# Patient Record
Sex: Male | Born: 1935
Health system: Southern US, Community
[De-identification: ages and names within clinical notes are randomized; demographics above are authoritative.]

## PROBLEM LIST (undated history)

## (undated) DIAGNOSIS — M5104 Intervertebral disc disorders with myelopathy, thoracic region: Secondary | ICD-10-CM

## (undated) DIAGNOSIS — M21372 Foot drop, left foot: Secondary | ICD-10-CM

## (undated) DIAGNOSIS — G7000 Myasthenia gravis without (acute) exacerbation: Secondary | ICD-10-CM

## (undated) DIAGNOSIS — I10 Essential (primary) hypertension: Secondary | ICD-10-CM

## (undated) DIAGNOSIS — R3915 Urgency of urination: Secondary | ICD-10-CM

## (undated) DIAGNOSIS — C801 Malignant (primary) neoplasm, unspecified: Secondary | ICD-10-CM

## (undated) DIAGNOSIS — M1990 Unspecified osteoarthritis, unspecified site: Secondary | ICD-10-CM

## (undated) DIAGNOSIS — J189 Pneumonia, unspecified organism: Secondary | ICD-10-CM

## (undated) DIAGNOSIS — R32 Unspecified urinary incontinence: Secondary | ICD-10-CM

## (undated) DIAGNOSIS — R1310 Dysphagia, unspecified: Secondary | ICD-10-CM

## (undated) DIAGNOSIS — E785 Hyperlipidemia, unspecified: Secondary | ICD-10-CM

## (undated) DIAGNOSIS — R490 Dysphonia: Secondary | ICD-10-CM

## (undated) HISTORY — PX: TONSILLECTOMY: SUR1361

## (undated) HISTORY — PX: SKIN CANCER EXCISION: SHX779

## (undated) HISTORY — PX: BACK SURGERY: SHX140

## (undated) HISTORY — PX: PROSTATE SURGERY: SHX751

## (undated) HISTORY — DX: Hyperlipidemia, unspecified: E78.5

## (undated) HISTORY — DX: Essential (primary) hypertension: I10

---

## 2003-06-04 ENCOUNTER — Ambulatory Visit (HOSPITAL_COMMUNITY): Admission: RE | Admit: 2003-06-04 | Discharge: 2003-06-04 | Payer: Self-pay | Admitting: Neurosurgery

## 2003-07-27 ENCOUNTER — Inpatient Hospital Stay (HOSPITAL_COMMUNITY): Admission: RE | Admit: 2003-07-27 | Discharge: 2003-07-28 | Payer: Self-pay | Admitting: Neurosurgery

## 2004-06-05 ENCOUNTER — Ambulatory Visit: Payer: Self-pay | Admitting: Family Medicine

## 2004-06-15 ENCOUNTER — Ambulatory Visit: Payer: Self-pay | Admitting: Family Medicine

## 2004-06-26 ENCOUNTER — Ambulatory Visit (HOSPITAL_COMMUNITY): Admission: RE | Admit: 2004-06-26 | Discharge: 2004-06-26 | Payer: Self-pay | Admitting: Neurosurgery

## 2004-11-05 ENCOUNTER — Ambulatory Visit: Payer: Self-pay | Admitting: Family Medicine

## 2004-12-12 ENCOUNTER — Ambulatory Visit: Payer: Self-pay | Admitting: Family Medicine

## 2005-05-06 ENCOUNTER — Ambulatory Visit: Payer: Self-pay | Admitting: Family Medicine

## 2005-05-08 ENCOUNTER — Ambulatory Visit: Payer: Self-pay | Admitting: Family Medicine

## 2005-09-25 ENCOUNTER — Ambulatory Visit: Payer: Self-pay | Admitting: Family Medicine

## 2005-10-03 ENCOUNTER — Ambulatory Visit: Payer: Self-pay | Admitting: Family Medicine

## 2006-01-09 ENCOUNTER — Ambulatory Visit: Payer: Self-pay | Admitting: Family Medicine

## 2006-03-07 ENCOUNTER — Ambulatory Visit: Payer: Self-pay | Admitting: Family Medicine

## 2006-03-20 ENCOUNTER — Ambulatory Visit: Payer: Self-pay | Admitting: Family Medicine

## 2007-03-02 ENCOUNTER — Encounter (INDEPENDENT_AMBULATORY_CARE_PROVIDER_SITE_OTHER): Payer: Self-pay | Admitting: Urology

## 2007-03-02 ENCOUNTER — Inpatient Hospital Stay (HOSPITAL_COMMUNITY): Admission: RE | Admit: 2007-03-02 | Discharge: 2007-03-03 | Payer: Self-pay | Admitting: Urology

## 2010-05-25 ENCOUNTER — Other Ambulatory Visit: Payer: Self-pay | Admitting: Neurosurgery

## 2010-05-25 DIAGNOSIS — M545 Low back pain, unspecified: Secondary | ICD-10-CM

## 2010-05-28 ENCOUNTER — Ambulatory Visit
Admission: RE | Admit: 2010-05-28 | Discharge: 2010-05-28 | Disposition: A | Discharge status: Home / Self Care | Payer: Medicare Other | Source: Ambulatory Visit | Attending: Neurosurgery | Admitting: Neurosurgery

## 2010-05-28 DIAGNOSIS — M545 Low back pain, unspecified: Secondary | ICD-10-CM

## 2010-05-30 ENCOUNTER — Inpatient Hospital Stay (HOSPITAL_COMMUNITY): Payer: Medicare Other

## 2010-05-30 ENCOUNTER — Inpatient Hospital Stay (HOSPITAL_COMMUNITY)
Admission: RE | Admit: 2010-05-30 | Discharge: 2010-05-31 | DRG: 491 | Disposition: A | Discharge status: Home / Self Care | Payer: Medicare Other | Source: Ambulatory Visit | Attending: Neurosurgery | Admitting: Neurosurgery

## 2010-05-30 DIAGNOSIS — M5137 Other intervertebral disc degeneration, lumbosacral region: Principal | ICD-10-CM | POA: Diagnosis present

## 2010-05-30 DIAGNOSIS — M51379 Other intervertebral disc degeneration, lumbosacral region without mention of lumbar back pain or lower extremity pain: Principal | ICD-10-CM | POA: Diagnosis present

## 2010-05-30 DIAGNOSIS — M216X9 Other acquired deformities of unspecified foot: Secondary | ICD-10-CM | POA: Diagnosis present

## 2010-05-30 LAB — URINALYSIS, ROUTINE W REFLEX MICROSCOPIC
Bilirubin Urine: NEGATIVE
Glucose, UA: NEGATIVE mg/dL
Hgb urine dipstick: NEGATIVE
Ketones, ur: NEGATIVE mg/dL
Protein, ur: NEGATIVE mg/dL
Urobilinogen, UA: 0.2 mg/dL (ref 0.0–1.0)

## 2010-05-30 LAB — CBC
MCH: 28.9 pg (ref 26.0–34.0)
MCHC: 33.4 g/dL (ref 30.0–36.0)
MCV: 86.6 fL (ref 78.0–100.0)
Platelets: 256 10*3/uL (ref 150–400)
RDW: 13.2 % (ref 11.5–15.5)
WBC: 9.2 10*3/uL (ref 4.0–10.5)

## 2010-05-30 LAB — BASIC METABOLIC PANEL
BUN: 14 mg/dL (ref 6–23)
CO2: 29 mEq/L (ref 19–32)
Chloride: 104 mEq/L (ref 96–112)
Creatinine, Ser: 0.76 mg/dL (ref 0.4–1.5)

## 2010-05-30 LAB — DIFFERENTIAL
Basophils Absolute: 0.1 10*3/uL (ref 0.0–0.1)
Eosinophils Relative: 2 % (ref 0–5)
Lymphocytes Relative: 29 % (ref 12–46)
Lymphs Abs: 2.6 10*3/uL (ref 0.7–4.0)
Monocytes Absolute: 0.7 10*3/uL (ref 0.1–1.0)
Neutro Abs: 5.6 10*3/uL (ref 1.7–7.7)

## 2010-05-30 LAB — APTT: aPTT: 30 seconds (ref 24–37)

## 2010-05-31 NOTE — Op Note (Signed)
Duane Burns, STRIKE               ACCOUNT NO.:  [id number]  MEDICAL RECORD NO.:  [id number]           PATIENT TYPE:  I  LOCATION:  3019                         FACILITY:  [facility]  PHYSICIAN:  Hilda Lias, M.D.   DATE OF BIRTH:  February 13, 1936  DATE OF PROCEDURE:  05/30/2010 DATE OF DISCHARGE:                              OPERATIVE REPORT   PREOPERATIVE DIAGNOSIS:  Left foot drop, chronic.  Degenerative disk disease, L3-L4, L4-L5, L5-S1.  POSTOPERATIVE DIAGNOSES:  Left foot drop, chronic.  Degenerative disk disease, L3-L4, L4-L5, L5-S1.  PROCEDURE:  Left L4-L5 hemilaminectomy, lysis of adhesions, decompression of the L3, L4, L5, and S1 nerve root, video microscope.  SURGEON:  Hilda Lias, MD.  ASSISTANT:  Stefani Dama, MD.  CLINICAL HISTORY:  [name] is a gentleman, who came to my office few days ago, complaining of back pain, worsened to the left leg. Clinically, he has a foot drop and this has been going on for more than a year.  In the past, he has a diskectomy at the level of L4-L5.  X-rays shows spondylosis at multiple level, worse at the L3-L4, L4-L5, borderline between L5-S1.  Surgery was advised and he and his wife knew the risks such as no improvement, need for surgery, CSF leak, infection, need for surgery.  DESCRIPTION OF PROCEDURE:  The patient was taken to the OR and he was positioned in a prone manner.  The back was cleaned with DuraPrep. Midline incision was made following the previous one and muscles were retracted laterally.  X-rays showed that we were at the level of L4-L5. Then, we started working our way.  The patient had quite a bit of adhesion to prevent any entry into the thecal sac.  We proceeded down below using hemilaminectomy of L5, working away from below up.  Above, we did a hemilaminectomy of L4 and worked our way to the midline.  At the level of L3-L4, he had quite a bit of stenosis, compromising the foramen and decompression of L3 was  done.  The same thing was done at the level of L4-L5.  We retracted the thecal sac and indeed he has had quite a bit of calcification with a calcified disk at the level of L4- L5.  We drilled that area.  Lysis was accomplished.  Then, we were unable to enter the disk space secondary to the innominate system.  The L5 nerve root was quite frail with mild fibrosis.  Decompression was done all the way laterally using the Kerrison punch.  Then, we worked our way at the level of L5-S1, and to my surprise, he has a herniated disk compromising the takeoff of S1.  Incision was made and diskectomy of L5-S1 was accomplished.  At the end, we had plenty of room for the L3, L4, L5, and S1 nerve root.  The area was irrigated. Valsalva maneuver was negative.  Tisseel was left in the pleural space, so was fentanyl and Depo-Medrol.  The wound was closed with Vicryl and staples.          ______________________________ Hilda Lias, M.D.     EB/MEDQ  D:  05/30/2010  T:  05/31/2010  Job:  [id number]  Electronically Signed by Hilda Lias M.D. on 05/31/2010 06:15:16 PM

## 2010-06-25 ENCOUNTER — Ambulatory Visit: Payer: Medicare Other | Attending: Neurosurgery | Admitting: Physical Therapy

## 2010-06-25 DIAGNOSIS — M6281 Muscle weakness (generalized): Secondary | ICD-10-CM | POA: Insufficient documentation

## 2010-06-25 DIAGNOSIS — M545 Low back pain, unspecified: Secondary | ICD-10-CM | POA: Insufficient documentation

## 2010-06-25 DIAGNOSIS — R5381 Other malaise: Secondary | ICD-10-CM | POA: Insufficient documentation

## 2010-06-25 DIAGNOSIS — IMO0001 Reserved for inherently not codable concepts without codable children: Secondary | ICD-10-CM | POA: Insufficient documentation

## 2010-06-28 ENCOUNTER — Ambulatory Visit: Payer: Medicare Other | Attending: Neurosurgery | Admitting: Physical Therapy

## 2010-06-28 DIAGNOSIS — R5381 Other malaise: Secondary | ICD-10-CM | POA: Insufficient documentation

## 2010-06-28 DIAGNOSIS — M6281 Muscle weakness (generalized): Secondary | ICD-10-CM | POA: Insufficient documentation

## 2010-06-28 DIAGNOSIS — M545 Low back pain, unspecified: Secondary | ICD-10-CM | POA: Insufficient documentation

## 2010-06-28 DIAGNOSIS — IMO0001 Reserved for inherently not codable concepts without codable children: Secondary | ICD-10-CM | POA: Insufficient documentation

## 2010-07-03 ENCOUNTER — Ambulatory Visit: Payer: Medicare Other | Admitting: Physical Therapy

## 2010-07-05 ENCOUNTER — Ambulatory Visit: Payer: Medicare Other | Admitting: Physical Therapy

## 2010-07-10 ENCOUNTER — Ambulatory Visit: Payer: Medicare Other | Admitting: *Deleted

## 2010-07-10 NOTE — Discharge Summary (Signed)
NAMEEBUBECHUKWU, JEDLICKA               ACCOUNT NO.:  [id number]   MEDICAL RECORD NO.:  [id number]          PATIENT TYPE:  INP   LOCATION:  1429                         FACILITY:  [facility]   PHYSICIAN:  Heloise Purpura, MD      DATE OF BIRTH:  01-May-1935   DATE OF ADMISSION:  03/02/2007  DATE OF DISCHARGE:  03/03/2007                               DISCHARGE SUMMARY   ADMISSION DIAGNOSIS:  Prostate cancer.   DISCHARGE DIAGNOSIS:  Prostate cancer.   HISTORY AND PHYSICAL:  For full details, please see admission history  and physical.  Briefly, Mr. Gottwald is a 75 year old gentleman with  clinical localized adenocarcinoma of the prostate.  After discussion  regarding management options for treatment, he elected to proceed with  surgical therapy and a robotic prostatectomy.   HOSPITAL COURSE:  On March 03, 2007, the patient was taken to the  operating room and underwent a robotic-assisted laparoscopic radical  prostatectomy.  He tolerated this procedure well without complications.  Postoperatively, he was able to be transferred to a regular hospital  room following recovery from anesthesia.  He was able to begin  ambulating the night of surgery, and was monitored and remained  hemodynamically stable that evening.  His hematocrit was checked on the  morning of postoperative day #1 and appeared to be stable at 39.1.  He  maintained excellent urine output with minimal output from his pelvic  drain.  His pelvic drain was therefore able to be removed.  He was begun  on a clear liquid diet, which he tolerated without difficulty.  He was  therefore transitioned to oral pain medication.  By the afternoon of  postoperative day #1, he had met all discharge criteria and was able to  be discharged home in excellent condition.   DISPOSITION:  Home.   DISCHARGE MEDICATIONS:  Mr. Tracz was instructed to resume his regular  home medications, excepting any aspirin, nonsteroidal anti-inflammatory  drugs, or  herbal supplements for 10 days.  He was given a prescription  to take Vicodin as needed for pain and Cipro to begin 1 day prior to his  return visit for Foley catheter removal.   DISCHARGE INSTRUCTIONS:  He is instructed to be ambulatory, but  specifically told to refrain from any heavy lifting, strenuous activity,  or driving.  He was instructed to gradually advance his diet once  passing flatus.  He was instructed on routine Foley catheter care.   FOLLOWUP:  Mr. Pumphrey will follow up in one week for removal of the  Foley catheter and to discuss the surgical pathology in detail.      Heloise Purpura, MD  Electronically Signed     LB/MEDQ  D:  03/03/2007  T:  03/03/2007  Job:  [id number]

## 2010-07-10 NOTE — Op Note (Signed)
Duane Burns, Duane Burns               ACCOUNT NO.:  [id number]   MEDICAL RECORD NO.:  [id number]          PATIENT TYPE:  INP   LOCATION:  0006                         FACILITY:  [facility]   PHYSICIAN:  Heloise Purpura, MD      DATE OF BIRTH:  1935-06-14   DATE OF PROCEDURE:  03/02/2007  DATE OF DISCHARGE:                               OPERATIVE REPORT   PREOPERATIVE DIAGNOSIS:  Clinically localized adenocarcinoma of  prostate.   POSTOPERATIVE DIAGNOSIS:  Clinically localized adenocarcinoma of  prostate.   PROCEDURE:  1. Robotic assisted laparoscopic radical prostatectomy (non nerve      sparing).  2. Bilateral laparoscopic pelvic lymphadenectomy.   SURGEON:  Dr. Heloise Purpura.   ASSISTANT:  Dr. [place] Service.   ANESTHESIA:  General.   COMPLICATIONS:  None.   ESTIMATED BLOOD LOSS:  100 mL.   SPECIMENS:  1. Prostate and seminal vesicles.  2. Right pelvic lymph nodes.  3. Left pelvic lymph nodes.   DISPOSITION OF SPECIMEN:  To pathology.   DRAINS:  1. 20-French straight catheter.  2. #19 Blake pelvic drain.   INDICATIONS:  Duane Burns is a 75 year old gentleman with clinically  localized adenocarcinoma of prostate.  After discussion regarding  management options for treatment, he elected to proceed with surgical  therapy and the above procedure.  Potential risks/complications and  alternative options were discussed with the patient in detail and  informed consent was obtained.   DESCRIPTION OF PROCEDURE:  The patient was taken to the operating room  and a general anesthetic was administered.  He was given preoperative  antibiotics, placed in the dorsal lithotomy position, prepped and draped  in the usual sterile fashion.  Next preoperative time-out was performed.  A Foley catheter was then inserted into the bladder.  A site was  selected just to the left of the umbilicus for placement of the camera  port.  This was placed using a standard open Hassan technique.  This  allowed entry into the peritoneal cavity under direct vision and without  difficulty.  A 12 mm port was then placed and a pneumoperitoneum was  established.  With the 0 degrees lens, the abdomen was inspected and  there was no evidence of intra-abdominal injury or other abnormalities.  Attention then turned to placement of the remaining ports.  Bilateral 8  mm robotic ports were placed 10 cm lateral to and just inferior to the  camera port.  An additional 8 mm port was placed in the far left lateral  abdominal wall.  A 5 mm port was placed between the camera port and the  right robotic port.  An additional 12 mm robotic port was placed in the  far right lateral abdominal wall for laparoscopic assistance.  All ports  were placed under direct vision without difficulty and under direct  vision.  The surgical cart was then docked.  With the aid of the cautery  scissors, the bladder was reflected posteriorly allowing entry into the  space of Retzius and identification of the endopelvic fascia and  prostate.  The endopelvic fascia was incised from the  apex back to the  base of the prostate bilaterally and the underlying levator muscle  fibers were swept laterally off the prostate.  The dorsal venous complex  was therefore isolated and was able to be stapled and divided with a 45  mm Flex ETS stapler.  The bladder neck was identified with the aid of  Foley catheter manipulation and was divided anteriorly thereby exposing  the catheter.  The catheter balloon was deflated and the catheter was  used to retract the prostate anteriorly.  This exposed the posterior  bladder neck which was then divided.  Dissection continued between the  prostate and bladder until the vasa deferentia and seminal vesicles were  identified.  The vasa deferentia were isolated, divided and lifted  anteriorly.  Seminal vesicles were then dissected down to their tips  with care to control seminal vesicle arterial blood supply.   The seminal  vesicles were also lifted anteriorly.  The seminal vesicles were noted  to be adherent during their dissection.  The space between the fascia  and the anterior rectum was then bluntly developed thereby isolating the  vascular pedicles of the prostate.  On the right side, a wide non nerve  sparing prostatectomy was performed with Hem-o-lok clips used for  hemostasis.  The prostate pedicles and periprosthetic fat were clipped  and divided along the right side of the prostate distally.  On the left  side, a wide non nerve sparing procedure was also performed with Hem-o-  lok clips again used.  There was noted to be some very thick tissue in  the periprostatic area on the left side.  A very wide non nerve sparing  procedure was performed and the urethra was then sharply divided  allowing the prostate specimen to be disarticulated.  The pelvis was  copiously irrigated and hemostasis ensured.  With irrigation in the  pelvis, air was injected into the rectal catheter.  There was no  evidence of a rectal injury.  Due to the fact that the patient did have  findings concerning for more advanced disease than initially suspected,  it was decided to perform a pelvic lymphadenectomy.  Attention turned  the right pelvic sidewall and the fibrofatty tissue between the external  iliac vein, confluence of the iliac vessels, hypogastric artery, and  Cooper's ligament was dissected free from the pelvic sidewall with care  to preserve the obturator nerve.  Hem-o-lok clips were used for  lymphostasis and hemostasis.  The specimen was then passed off for  permanent pathologic analysis and an identical procedure was performed  on the contralateral side.  Attention then turned to the urethral  anastomosis.  A 2-0 Vicryl slip-knot was used to reapproximate  Denonvilliers fascia, the posterior bladder and posterior urethra.  A  double-armed 3-0 Monocryl suture was then used to perform a 360 degrees   running tension-free anastomosis between the bladder neck and urethra.  A new #19 Blake drain was brought to the left robotic port and  appropriately positioned in the pelvis.  It was secured to skin with a  nylon suture.  The surgical cart was then undocked.  The right lateral  12 mm port site was closed with a 0 Vicryl suture placed in the aid of  suture passer device.  All remaining ports were removed under direct  vision.  The prostate was then removed intact within the Endopouch  retrieval bag via the periumbilical port site.  This port site was then  closed with a running  0 Vicryl suture.  All ports were injected with  quarter percent Marcaine and reapproximated at the skin level with  staples.  Sterile dressings were applied.  The patient appeared to  tolerate procedure well without complications.  He was able to be  extubated and transferred to the recovery unit in satisfactory  condition.      Heloise Purpura, MD  Electronically Signed     LB/MEDQ  D:  03/02/2007  T:  03/02/2007  Job:  [id number]

## 2010-07-10 NOTE — H&P (Signed)
NAMEEWALD, BEG               ACCOUNT NO.:  [id number]   MEDICAL RECORD NO.:  [id number]          PATIENT TYPE:  INP   LOCATION:  NA                           FACILITY:  [facility]   PHYSICIAN:  Heloise Purpura, MD      DATE OF BIRTH:  1935/06/13   DATE OF ADMISSION:  DATE OF DISCHARGE:                              HISTORY & PHYSICAL   CHIEF COMPLAINT:  Prostate cancer.   HISTORY:  Mr. Scholer is a 75 year old gentleman with clinical stage T2A  prostate cancer with a PSA of 6.4 and Gleason score of 3+3=6.  After  discussing management options for clinically localized prostate cancer,  the patient has elected to proceed with surgical therapy and a robotic  prostatectomy.   PAST MEDICAL HISTORY:  1. Dyslipidemia.  2. History of melanoma.   PAST SURGICAL HISTORY:  1. History of back surgery.  2. History of removal of melanoma from right arm.   MEDICATIONS:  1. Aspirin.  2. Fish oil.  3. Glucosamine.  4. Multivitamin.  5. Simvastatin.  6. Vitamin E.   ALLERGIES:  No known drug allergies.   FAMILY HISTORY:  He denies a history of prostate cancer.   SOCIAL HISTORY:  The patient is currently married and is retired.  He  denies alcohol and tobacco use.   REVIEW OF SYSTEMS:  Pertinent positives include chronic back and joint  pain.  All other systems are reviewed and are otherwise negative.   PHYSICAL EXAM:  CONSTITUTIONAL:  A well-nourished, well-developed, age-  appropriate male in no acute distress.  CARDIOVASCULAR:  Regular rate and rhythm without obvious murmurs.  PULMONARY:  Lungs are clear bilaterally.  ABDOMEN:  Soft and nontender.  GU:  The prostate is estimated to be 40 g with a palpable nodule toward  the left mid aspect of the prostate.  EXTREMITIES:  No edema.   IMPRESSION:  Clinically localized prostate cancer.   PLAN:  Mr. Zee will undergo a robotic-assisted laparoscopic radical  prostatectomy and then be admitted to the hospital for routine  postoperative care.      Heloise Purpura, MD  Electronically Signed     LB/MEDQ  D:  03/02/2007  T:  03/02/2007  Job:  [id number]

## 2010-07-12 ENCOUNTER — Ambulatory Visit: Payer: Medicare Other | Admitting: *Deleted

## 2010-07-13 NOTE — Op Note (Signed)
Duane Burns, Duane Burns               ACCOUNT NO.:  [id number]   MEDICAL RECORD NO.:  [id number]          PATIENT TYPE:  OIB   LOCATION:  2855                         FACILITY:  [facility]   PHYSICIAN:  Hilda Lias, M.D.   DATE OF BIRTH:  Jun 09, 1935   DATE OF PROCEDURE:  06/26/2004  DATE OF DISCHARGE:                                 OPERATIVE REPORT   PREOPERATIVE DIAGNOSIS:  Right carpal tunnel syndrome.   POSTOPERATIVE DIAGNOSIS:  Right carpal tunnel syndrome.   PROCEDURE:  Decompression of the right median nerve.   SURGEON:  Hilda Lias, M.D.   ANESTHESIA:  IV plus local.   CLINICAL HISTORY:  The patient in the past underwent diskectomy in the  lumbar area.  He came to my office complaining of numbness and atrophy of  the right hand.  Indeed, I found that he has atrophy of the right thenar  muscle with weakness.  The nerve conduction velocity test showed that there  was no conduction whatsoever in the right median nerve across the wrist.  Because we were concerned about __________ of the right median nerve of  unknown time, decompression was advised.  The risks were explained to the  patient including the possibility of no improvement whatsoever.   PROCEDURE:  The patient was taken to the OR and the right hand and arm was  prepped with Betadine.  After that, the area was draped.  Infiltration with  Xylocaine was given along the base of the thumb.  Then with the knife we did  an incision along the base of the thumb through the skin, volar ligament and  through a thick carpal ligament which was calcified.  Decompression  posterior, distal and proximal was made along the ulnar nerve.  The nerve  was found to be flat and yellowish.  Having good decompression, the area was  irrigated, hemostasis was done with bipolar.  The wound was closed with  nylon.  The patient was stable, moving all fingers including the thumb at  the end of the procedure.  He is going to go to the recovery  room and  discharge when he is stable.  He will be seen by me in 48 hours or before.      EB/MEDQ  D:  06/26/2004  T:  06/26/2004  Job:  [id number]

## 2010-07-13 NOTE — H&P (Signed)
NAMEKENDAL, RAFFO                         ACCOUNT NO.:  [id number]   MEDICAL RECORD NO.:  [id number]                   PATIENT TYPE:  INP   LOCATION:  2899                                 FACILITY:  [facility]   PHYSICIAN:  Hilda Lias, M.D.                DATE OF BIRTH:  1936-01-24   DATE OF ADMISSION:  07/27/2003  DATE OF DISCHARGE:                                HISTORY & PHYSICAL   Mr. Koeppen is a gentleman who almost 30 years ago underwent left L4-5  discectomy.  Patient did really well.  He has not had any problem  whatsoever, but lately about 2-3 months ago he started to complain of back  pain, radiation down to the left leg associated with weakness.  I saw him in  my office in the middle of April, 2005; indeed he had quite a bit of  weakness of the left foot.  He declined surgery, and we went ahead with  conservative treatment.  Now he is not any better.  Despite medications, he  is getting worse.  Because of that, he wants to proceed with surgery.   PAST MEDICAL HISTORY:  L4-5 discectomy in 1992.   SOCIAL HISTORY:  Negative.   FAMILY HISTORY:  Unremarkable.   REVIEW OF SYSTEMS:  Positive for back and left leg pain.   PHYSICAL EXAMINATION:  GENERAL:  Patient came to my office limping on the  left foot.  HEENT:  Head, eyes, nose and throat are normal.  NECK:  Normal.  LUNGS:  Clear.  CARDIOVASCULAR:  Heart sounds normal.  There were no murmurs, normal pulses.  MENTAL STATUS:  Normal.  NEUROLOGIC:  Strength 2/5 weakness dorsiflexion of the left foot.  Sensory:  He complained of numbness which involved mostly the L5 nerve root.  Straight-  leg-raising:  Left side positive at 45 degrees.   The x-ray as well as the MRI shows degenerative changes with stenosis at  multiple levels, but at the level for 4-5 he has a herniated disk symptoms  to the left side.   CLINICAL IMPRESSION:  1. Degenerative disk disease with lumbar stenosis.  2. Left L4-5 herniated disk.   RECOMMENDATION:  Although the patient has multiple levels of compromise in  the lumbar spine, his main problem is the __________  requiring __________ .  We are going to take him to surgery to do L4-5 discectomy.  The surgery was  fully explained to him, including the possibility of recurrent need for  surgery, which might include fusion, CSF leak, infection, damage to the  vessels, the abdomen and __________.                                               Hilda Lias, M.D.   EB/MEDQ  D:  07/27/2003  T:  07/27/2003  Job:  [id number]

## 2010-07-13 NOTE — Op Note (Signed)
[name], Duane Burns                         ACCOUNT NO.:  [id number]   MEDICAL RECORD NO.:  [id number]                   PATIENT TYPE:  INP   LOCATION:  2899                                 FACILITY:  [facility]   PHYSICIAN:  Hilda Lias, M.D.                DATE OF BIRTH:  02/12/1936   DATE OF PROCEDURE:  07/27/2003  DATE OF DISCHARGE:                                 OPERATIVE REPORT   PREOPERATIVE DIAGNOSES:  1. Left L4-L5 herniated disk.  2. Foraminal stenosis.  3. Status post L4-5 diskectomy 1982.  4. Degenerative disk disease multiple levels.  5. Foot drop.   POSTOPERATIVE DIAGNOSES:  1. Left L4-L5 herniated disk.  2. Foraminal stenosis.  3. Status post L4-5 diskectomy 1982.  4. Degenerative disk disease multiple levels.  5. Foot drop.   OPERATION PERFORMED:  1. Left L4-5 laminotomy.  2. Lysis of adhesions.  3. Removal of calcified herniated disk compromising the L5 nerve root.  4. Foraminotomy for L4 and L5, microscope.   SURGEON:  Hilda Lias, M.D.   ASSISTANT:  Stefani Dama, M.D.   ANESTHESIA:  General.   INDICATIONS FOR PROCEDURE:  Duane Burns is a gentleman who back in 1992  underwent left 4-5 diskectomy.  Lately he had been complaining of more pain  down to the left leg associated with a 1/5 weakness of dorsiflexion of the  left foot.  MRI showed multiple levels of disease but at the level 4-5 he  had what looked like foraminal stenosis associated with herniated disk  disease.  Surgery was advised and the risks were explained in the history  and physical.   DESCRIPTION OF PROCEDURE:  The patient was taken to the operating room and  after intubation, he was positioned in a prone manner.  His back was prepped  with Betadine.  Midline incision followed the previous one but only using  half was made.  Muscles were retracted laterally.  Indeed, we found quite a  bit of calcification of the ligament.  With the microscope, we were able to  visualize the  lower part of L4 and the upper of L5.  Because of all the scar  tissue we decided to go below L5.  We removed half of the lamina of L5 until  we found the dura mater.  Then we worked our way up to lysis of adhesions.  The scar tissue was removed.  It was difficult to retract the L5 nerve root.  We drilled laterally until we were able to work our way underneath the  thecal sac.  Lysis was accomplished.  Indeed, there was a large calcified  disk from L4-5 down into the foramina of L5.  Removal of the disk using the  drill as well as the microcuret was accomplished.  We entered in the disk  space and it was absolutely empty.  Having good decompression of the L5 we  took  a look at the L4.  Foraminotomy was done to give more space to the L4  nerve root.  Valsalva maneuver was negative.  Then fentanyl and Depo-Medrol  were left in the epidural space and the wound was closed with Vicryl and  Steri-Strips.                                               Hilda Lias, M.D.    EB/MEDQ  D:  07/27/2003  T:  07/27/2003  Job:  [id number]

## 2010-07-17 ENCOUNTER — Ambulatory Visit: Payer: Medicare Other | Admitting: *Deleted

## 2010-07-19 ENCOUNTER — Ambulatory Visit: Payer: Medicare Other | Admitting: *Deleted

## 2010-07-24 ENCOUNTER — Ambulatory Visit: Payer: Medicare Other | Admitting: Physical Therapy

## 2010-07-26 ENCOUNTER — Ambulatory Visit: Payer: Medicare Other | Admitting: *Deleted

## 2010-07-31 ENCOUNTER — Ambulatory Visit: Payer: Medicare Other | Attending: Neurosurgery | Admitting: Physical Therapy

## 2010-07-31 DIAGNOSIS — IMO0001 Reserved for inherently not codable concepts without codable children: Secondary | ICD-10-CM | POA: Insufficient documentation

## 2010-07-31 DIAGNOSIS — R5381 Other malaise: Secondary | ICD-10-CM | POA: Insufficient documentation

## 2010-07-31 DIAGNOSIS — M6281 Muscle weakness (generalized): Secondary | ICD-10-CM | POA: Insufficient documentation

## 2010-07-31 DIAGNOSIS — M545 Low back pain, unspecified: Secondary | ICD-10-CM | POA: Insufficient documentation

## 2010-08-02 ENCOUNTER — Ambulatory Visit: Payer: Medicare Other | Admitting: Physical Therapy

## 2010-08-07 ENCOUNTER — Ambulatory Visit: Payer: Medicare Other | Admitting: Physical Therapy

## 2010-08-09 ENCOUNTER — Encounter: Payer: Medicare Other | Admitting: Physical Therapy

## 2010-08-16 ENCOUNTER — Ambulatory Visit: Payer: Medicare Other | Admitting: Physical Therapy

## 2010-08-21 ENCOUNTER — Ambulatory Visit: Payer: Medicare Other | Admitting: Physical Therapy

## 2010-08-23 ENCOUNTER — Ambulatory Visit: Payer: Medicare Other | Admitting: Physical Therapy

## 2010-11-15 LAB — CBC
Hemoglobin: 13.6
Platelets: 309
RDW: 13.3

## 2010-11-15 LAB — HEMOGLOBIN AND HEMATOCRIT, BLOOD: Hemoglobin: 12.6 — ABNORMAL LOW

## 2010-11-30 LAB — CBC
HCT: 40.9
MCV: 84.3
RBC: 4.85
WBC: 10.1

## 2010-11-30 LAB — BASIC METABOLIC PANEL
BUN: 15
Calcium: 9.3
Chloride: 107
Creatinine, Ser: 0.98
GFR calc Af Amer: >60
GFR calc non Af Amer: >60

## 2010-11-30 LAB — TYPE AND SCREEN
ABO/RH(D): B POS
Antibody Screen: NEGATIVE

## 2013-10-05 DIAGNOSIS — I10 Essential (primary) hypertension: Secondary | ICD-10-CM | POA: Diagnosis present

## 2013-10-05 DIAGNOSIS — E785 Hyperlipidemia, unspecified: Secondary | ICD-10-CM | POA: Insufficient documentation

## 2013-12-27 ENCOUNTER — Emergency Department (HOSPITAL_COMMUNITY)
Admission: EM | Admit: 2013-12-27 | Discharge: 2013-12-27 | Disposition: A | Discharge status: Home / Self Care | Payer: Medicare HMO | Attending: Emergency Medicine | Admitting: Emergency Medicine

## 2013-12-27 ENCOUNTER — Emergency Department (HOSPITAL_COMMUNITY): Payer: Medicare HMO

## 2013-12-27 ENCOUNTER — Encounter (HOSPITAL_COMMUNITY): Payer: Self-pay

## 2013-12-27 DIAGNOSIS — Z79899 Other long term (current) drug therapy: Secondary | ICD-10-CM | POA: Diagnosis not present

## 2013-12-27 DIAGNOSIS — Z8546 Personal history of malignant neoplasm of prostate: Secondary | ICD-10-CM | POA: Insufficient documentation

## 2013-12-27 DIAGNOSIS — M549 Dorsalgia, unspecified: Secondary | ICD-10-CM | POA: Diagnosis not present

## 2013-12-27 DIAGNOSIS — Z7982 Long term (current) use of aspirin: Secondary | ICD-10-CM | POA: Diagnosis not present

## 2013-12-27 DIAGNOSIS — M25551 Pain in right hip: Secondary | ICD-10-CM | POA: Diagnosis not present

## 2013-12-27 DIAGNOSIS — M25569 Pain in unspecified knee: Secondary | ICD-10-CM

## 2013-12-27 DIAGNOSIS — M25561 Pain in right knee: Secondary | ICD-10-CM

## 2013-12-27 DIAGNOSIS — M79609 Pain in unspecified limb: Secondary | ICD-10-CM

## 2013-12-27 HISTORY — DX: Malignant (primary) neoplasm, unspecified: C80.1

## 2013-12-27 MED ORDER — HYDROCODONE-ACETAMINOPHEN 5-325 MG PO TABS
1.0000 | ORAL_TABLET | Freq: Four times a day (QID) | ORAL | Status: DC | PRN
Start: 2013-12-27 — End: 2014-05-11

## 2013-12-27 MED ORDER — HYDROCODONE-ACETAMINOPHEN 5-325 MG PO TABS
1.0000 | ORAL_TABLET | Freq: Once | ORAL | Status: AC
  Administered 2013-12-27: 1 via ORAL
  Filled 2013-12-27: qty 1

## 2013-12-27 NOTE — ED Notes (Signed)
EDMD at bedside

## 2013-12-27 NOTE — ED Notes (Signed)
Pt is c/o right leg pain.  Pt reports that he has had pain to his right leg for aprox. 4 weeks and have an orthopedic appointment at [facility] for the same.  Pt reports this morning the veins in his right leg "got really big and you could just see them bounding."

## 2013-12-27 NOTE — Discharge Instructions (Signed)
Hip Pain Your hip is the joint between your upper legs and your lower pelvis. The bones, cartilage, tendons, and muscles of your hip joint perform a lot of work each day supporting your body weight and allowing you to move around. Hip pain can range from a minor ache to severe pain in one or both of your hips. Pain may be felt on the inside of the hip joint near the groin, or the outside near the buttocks and upper thigh. You may have swelling or stiffness as well.  HOME CARE INSTRUCTIONS   Take medicines only as directed by your health care provider.  Apply ice to the injured area:  Put ice in a plastic bag.  Place a towel between your skin and the bag.  Leave the ice on for 15-20 minutes at a time, 3-4 times a day.  Keep your leg raised (elevated) when possible to lessen swelling.  Avoid activities that cause pain.  Follow specific exercises as directed by your health care provider.  Sleep with a pillow between your legs on your most comfortable side.  Record how often you have hip pain, the location of the pain, and what it feels like. SEEK MEDICAL CARE IF:   You are unable to put weight on your leg.  Your hip is red or swollen or very tender to touch.  Your pain or swelling continues or worsens after 1 week.  You have increasing difficulty walking.  You have a fever. SEEK IMMEDIATE MEDICAL CARE IF:   You have fallen.  You have a sudden increase in pain and swelling in your hip. MAKE SURE YOU:   Understand these instructions.  Will watch your condition.  Will get help right away if you are not doing well or get worse. Document Released: 08/01/2009 Document Revised: 06/28/2013 Document Reviewed: 10/08/2012 [facility] Patient Information 2015 [place], [place]. This information is not intended to replace advice given to you by your health care provider. Make sure you discuss any questions you have with your health care provider.  Knee Pain The knee is the complex joint  between your thigh and your lower leg. It is made up of bones, tendons, ligaments, and cartilage. The bones that make up the knee are:  The femur in the thigh.  The tibia and fibula in the lower leg.  The patella or kneecap riding in the groove on the lower femur. CAUSES  Knee pain is a common complaint with many causes. A few of these causes are:  Injury, such as:  A ruptured ligament or tendon injury.  Torn cartilage.  Medical conditions, such as:  Gout  Arthritis  Infections  Overuse, over training, or overdoing a physical activity. Knee pain can be minor or severe. Knee pain can accompany debilitating injury. Minor knee problems often respond well to self-care measures or get well on their own. More serious injuries may need medical intervention or even surgery. SYMPTOMS The knee is complex. Symptoms of knee problems can vary widely. Some of the problems are:  Pain with movement and weight bearing.  Swelling and tenderness.  Buckling of the knee.  Inability to straighten or extend your knee.  Your knee locks and you cannot straighten it.  Warmth and redness with pain and fever.  Deformity or dislocation of the kneecap. DIAGNOSIS  Determining what is wrong may be very straight forward such as when there is an injury. It can also be challenging because of the complexity of the knee. Tests to make a  diagnosis may include:  Your caregiver taking a history and doing a physical exam.  Routine X-rays can be used to rule out other problems. X-rays will not reveal a cartilage tear. Some injuries of the knee can be diagnosed by:  Arthroscopy a surgical technique by which a small video camera is inserted through tiny incisions on the sides of the knee. This procedure is used to examine and repair internal knee joint problems. Tiny instruments can be used during arthroscopy to repair the torn knee cartilage (meniscus).  Arthrography is a radiology technique. A contrast  liquid is directly injected into the knee joint. Internal structures of the knee joint then become visible on X-ray film.  An MRI scan is a non X-ray radiology procedure in which magnetic fields and a computer produce two- or three-dimensional images of the inside of the knee. Cartilage tears are often visible using an MRI scanner. MRI scans have largely replaced arthrography in diagnosing cartilage tears of the knee.  Blood work.  Examination of the fluid that helps to lubricate the knee joint (synovial fluid). This is done by taking a sample out using a needle and a syringe. TREATMENT The treatment of knee problems depends on the cause. Some of these treatments are:  Depending on the injury, proper casting, splinting, surgery, or physical therapy care will be needed.  Give yourself adequate recovery time. Do not overuse your joints. If you begin to get sore during workout routines, back off. Slow down or do fewer repetitions.  For repetitive activities such as cycling or running, maintain your strength and nutrition.  Alternate muscle groups. For example, if you are a weight lifter, work the upper body on one day and the lower body the next.  Either tight or weak muscles do not give the proper support for your knee. Tight or weak muscles do not absorb the stress placed on the knee joint. Keep the muscles surrounding the knee strong.  Take care of mechanical problems.  If you have flat feet, orthotics or special shoes may help. See your caregiver if you need help.  Arch supports, sometimes with wedges on the inner or outer aspect of the heel, can help. These can shift pressure away from the side of the knee most bothered by osteoarthritis.  A brace called an "unloader" brace also may be used to help ease the pressure on the most arthritic side of the knee.  If your caregiver has prescribed crutches, braces, wraps or ice, use as directed. The acronym for this is PRICE. This means  protection, rest, ice, compression, and elevation.  Nonsteroidal anti-inflammatory drugs (NSAIDs), can help relieve pain. But if taken immediately after an injury, they may actually increase swelling. Take NSAIDs with food in your stomach. Stop them if you develop stomach problems. Do not take these if you have a history of ulcers, stomach pain, or bleeding from the bowel. Do not take without your caregiver's approval if you have problems with fluid retention, heart failure, or kidney problems.  For ongoing knee problems, physical therapy may be helpful.  Glucosamine and chondroitin are over-the-counter dietary supplements. Both may help relieve the pain of osteoarthritis in the knee. These medicines are different from the usual anti-inflammatory drugs. Glucosamine may decrease the rate of cartilage destruction.  Injections of a corticosteroid drug into your knee joint may help reduce the symptoms of an arthritis flare-up. They may provide pain relief that lasts a few months. You may have to wait a few months between injections.  The injections do have a small increased risk of infection, water retention, and elevated blood sugar levels.  Hyaluronic acid injected into damaged joints may ease pain and provide lubrication. These injections may work by reducing inflammation. A series of shots may give relief for as long as 6 months.  Topical painkillers. Applying certain ointments to your skin may help relieve the pain and stiffness of osteoarthritis. Ask your pharmacist for suggestions. Many over the-counter products are approved for temporary relief of arthritis pain.  In some countries, doctors often prescribe topical NSAIDs for relief of chronic conditions such as arthritis and tendinitis. A review of treatment with NSAID creams found that they worked as well as oral medications but without the serious side effects. PREVENTION  Maintain a healthy weight. Extra pounds put more strain on your  joints.  Get strong, stay limber. Weak muscles are a common cause of knee injuries. Stretching is important. Include flexibility exercises in your workouts.  Be smart about exercise. If you have osteoarthritis, chronic knee pain or recurring injuries, you may need to change the way you exercise. This does not mean you have to stop being active. If your knees ache after jogging or playing basketball, consider switching to swimming, water aerobics, or other low-impact activities, at least for a few days a week. Sometimes limiting high-impact activities will provide relief.  Make sure your shoes fit well. Choose footwear that is right for your sport.  Protect your knees. Use the proper gear for knee-sensitive activities. Use kneepads when playing volleyball or laying carpet. Buckle your seat belt every time you drive. Most shattered kneecaps occur in car accidents.  Rest when you are tired. SEEK MEDICAL CARE IF:  You have knee pain that is continual and does not seem to be getting better.  SEEK IMMEDIATE MEDICAL CARE IF:  Your knee joint feels hot to the touch and you have a high fever. MAKE SURE YOU:   Understand these instructions.  Will watch your condition.  Will get help right away if you are not doing well or get worse. Document Released: 12/09/2006 Document Revised: 05/06/2011 Document Reviewed: 12/09/2006 [facility] Patient Information 2015 [place], [place]. This information is not intended to replace advice given to you by your health care provider. Make sure you discuss any questions you have with your health care provider.

## 2013-12-27 NOTE — Progress Notes (Signed)
VASCULAR LAB PRELIMINARY  PRELIMINARY  PRELIMINARY  PRELIMINARY  Right lower extremity venous Doppler completed.    Preliminary report:  There is no DVT or SVT noted in the right lower extremity.   Duane Burns, RVT 12/27/2013, 1:17 PM

## 2013-12-27 NOTE — ED Provider Notes (Signed)
CSN: [id number]     Arrival date & time 12/27/13  1030 History   First MD Initiated Contact with Patient 12/27/13 1106     Chief Complaint  Patient presents with  . Leg Pain     (Consider location/radiation/quality/duration/timing/severity/associated sxs/prior Treatment) Patient is a 78 y.o. male presenting with leg pain. The history is provided by the patient.  Leg Pain Associated symptoms: back pain   patient has pain in his right groin and right knee. Has been getting worse over the last month. Reportedly seen at [facility]. Reportedly negative x-rays. Had increasing swelling in his veins on that side. No chest pain, no trouble breathing. No fevers. No trauma. Previous history of prostate cancer.family history of blood clots, although the patient has never had one.  Past Medical History  Diagnosis Date  . Cancer    Past Surgical History  Procedure Laterality Date  . Back surgery     History reviewed. No pertinent family history. History  Substance Use Topics  . Smoking status: Never Smoker   . Smokeless tobacco: Not on file  . Alcohol Use: No    Review of Systems  Constitutional: Negative for activity change and appetite change.  Eyes: Negative for pain.  Respiratory: Negative for chest tightness and shortness of breath.   Cardiovascular: Negative for chest pain and leg swelling.  Gastrointestinal: Negative for nausea, vomiting, abdominal pain and diarrhea.  Genitourinary: Negative for flank pain.  Musculoskeletal: Positive for back pain. Negative for neck stiffness.       Right hip right knee pain  Skin: Negative for color change, rash and wound.  Neurological: Negative for weakness, numbness and headaches.  Psychiatric/Behavioral: Negative for behavioral problems.      Allergies  Review of patient's allergies indicates no known allergies.  Home Medications   Prior to Admission medications   Medication Sig Start Date End Date Taking? Authorizing  Provider  aspirin EC 81 MG tablet Take 81 mg by mouth daily.   Yes Historical Provider, MD  calcium elemental as carbonate (TUMS ULTRA 1000) 400 MG tablet Chew 1,000 mg by mouth daily.   Yes Historical Provider, MD  cetirizine (ZYRTEC) 10 MG tablet Take 10 mg by mouth at bedtime.   Yes Historical Provider, MD  cholecalciferol (VITAMIN D) 1000 UNITS tablet Take 1,000 Units by mouth daily.   Yes Historical Provider, MD  Cinnamon 500 MG TABS Take 500 mg by mouth daily.   Yes Historical Provider, MD  lisinopril (PRINIVIL,ZESTRIL) 10 MG tablet Take 10 mg by mouth daily.   Yes Historical Provider, MD  Multiple Vitamin (MULTIVITAMIN WITH MINERALS) TABS tablet Take 1 tablet by mouth daily.   Yes Historical Provider, MD  Omega-3 Fatty Acids (FISH OIL) 1200 MG CAPS Take 1,200-2,400 mg by mouth 2 (two) times daily. Take 2 capsules in the morning and 1 capsule in the evening   Yes Historical Provider, MD  pravastatin (PRAVACHOL) 80 MG tablet Take 80 mg by mouth daily.   Yes Historical Provider, MD  HYDROcodone-acetaminophen (NORCO/VICODIN) 5-325 MG per tablet Take 1-2 tablets by mouth every 6 (six) hours as needed for moderate pain. 12/27/13   Jasper Riling. Graylin Sperling, MD   BP 146/86 mmHg  Pulse 79  Temp(Src) 99 F (37.2 C) (Oral)  Ht 5\' 10"  (4.580 m)  Wt 200 lb (90.719 kg)  BMI 28.70 kg/m2  SpO2 96% Physical Exam  Constitutional: He appears well-developed and well-nourished.  HENT:  Head: Normocephalic.  Cardiovascular: Normal rate and regular rhythm.  Pulmonary/Chest: Effort normal. He has no rales.  Abdominal: Soft. There is no tenderness.  Musculoskeletal: He exhibits tenderness.  Decreased range of motion right knee due to pain.no erythema. Neurovascularly intact over right foot. Some decreased range of motion right hip. Some prominent veins on the right lower leg. No erythema  Skin: Skin is warm.    ED Course  Procedures (including critical care time) Labs Review Labs Reviewed - No data to  display  Imaging Review Dg Hip Complete Right  12/27/2013   CLINICAL DATA:  Right hip pain.  No known injury.  EXAM: RIGHT HIP - COMPLETE 2+ VIEW  COMPARISON:  None.  FINDINGS: Moderate bilateral hip osteoarthritis with joint space narrowing, subchondral sclerosis, and subchondral cyst change noted. There is marginal spur formation identified. No fracture or subluxation noted.  IMPRESSION: 1. Moderate bilateral hip osteoarthritis. 2. No acute findings identified.   Electronically Signed   By: Kerby Moors M.D.   On: 12/27/2013 13:36   Dg Knee Complete 4 Views Right  12/27/2013   CLINICAL DATA:  Knee pain; no mention of trauma.  EXAM: RIGHT KNEE - COMPLETE 4+ VIEW  COMPARISON:  None  FINDINGS: The bones are adequately mineralized. There is beaking of the tibial spines. The joint spaces are reasonably well maintained. There is no joint effusion. There is chondrocalcinosis of the menisci. There are soft tissue calcifications in the popliteal fossa region.  IMPRESSION: There are mild degenerative changes of all 3 joint compartments. There is chondrocalcinosis. There is no acute bony abnormality.   Electronically Signed   By: David  Martinique   On: 12/27/2013 13:35     EKG Interpretation None      MDM   Final diagnoses:  Knee pain  Right knee pain  Hip pain, right    Patient with knee pain and hip pain. Some swelling xray and doppler reassuring. Will d/c with ortho follow up    Jasper Riling. Alvino Chapel, MD 12/28/13 [phone]

## 2014-01-25 ENCOUNTER — Other Ambulatory Visit: Payer: Self-pay

## 2014-01-25 ENCOUNTER — Encounter: Payer: Self-pay | Admitting: Vascular Surgery

## 2014-01-25 DIAGNOSIS — I83811 Varicose veins of right lower extremities with pain: Secondary | ICD-10-CM

## 2014-01-25 DIAGNOSIS — M79604 Pain in right leg: Secondary | ICD-10-CM

## 2014-02-14 ENCOUNTER — Encounter: Payer: Self-pay | Admitting: Vascular Surgery

## 2014-02-15 ENCOUNTER — Ambulatory Visit (INDEPENDENT_AMBULATORY_CARE_PROVIDER_SITE_OTHER): Payer: Medicare HMO | Admitting: Vascular Surgery

## 2014-02-15 ENCOUNTER — Encounter: Payer: Self-pay | Admitting: Vascular Surgery

## 2014-02-15 ENCOUNTER — Ambulatory Visit (HOSPITAL_COMMUNITY)
Admission: RE | Admit: 2014-02-15 | Discharge: 2014-02-15 | Disposition: A | Discharge status: Home / Self Care | Payer: Medicare HMO | Source: Ambulatory Visit | Attending: Vascular Surgery | Admitting: Vascular Surgery

## 2014-02-15 VITALS — BP 142/72 | HR 73 | Resp 16 | Ht 69.0 in | Wt 196.0 lb

## 2014-02-15 DIAGNOSIS — I8391 Asymptomatic varicose veins of right lower extremity: Secondary | ICD-10-CM

## 2014-02-15 DIAGNOSIS — I83811 Varicose veins of right lower extremities with pain: Secondary | ICD-10-CM | POA: Insufficient documentation

## 2014-02-15 DIAGNOSIS — I8390 Asymptomatic varicose veins of unspecified lower extremity: Secondary | ICD-10-CM | POA: Insufficient documentation

## 2014-02-15 DIAGNOSIS — M79604 Pain in right leg: Secondary | ICD-10-CM | POA: Insufficient documentation

## 2014-02-15 NOTE — Progress Notes (Signed)
Subjective:     Patient ID: MALE Duane Burns, male   DOB: 1935/04/04, 78 y.o.   MRN: [id number]  HPI this 78 year old male was referred for right leg pain and varicose veins. He has been having some severe right hip discomfort recently had times causing him difficulty getting out of bed. He has had 3 previous lumbar spine procedures by Dr. Sherwood Gambler. He denies any history of DVT, thrombophlebitis, stasis ulcers, or bleeding. He has no history of significant swelling. He has noted some bulging veins between the knee and the ankle on occasion. Past Medical History  Diagnosis Date  . Cancer   . Hyperlipidemia   . Hypertension     History  Substance Use Topics  . Smoking status: Current Every Day Smoker  . Smokeless tobacco: Never Used  . Alcohol Use: No    Family History  Problem Relation Age of Onset  . Diabetes Daughter   . Hypertension Daughter     No Known Allergies  Current outpatient prescriptions: aspirin EC 81 MG tablet, Take 81 mg by mouth daily., Disp: , Rfl: ;  calcium elemental as carbonate (TUMS ULTRA 1000) 400 MG tablet, Chew 1,000 mg by mouth daily., Disp: , Rfl: ;  cholecalciferol (VITAMIN D) 1000 UNITS tablet, Take 1,000 Units by mouth daily., Disp: , Rfl: ;  Cinnamon 500 MG TABS, Take 500 mg by mouth daily., Disp: , Rfl:  lisinopril (PRINIVIL,ZESTRIL) 10 MG tablet, Take 10 mg by mouth daily., Disp: , Rfl: ;  Multiple Vitamin (MULTIVITAMIN WITH MINERALS) TABS tablet, Take 1 tablet by mouth daily., Disp: , Rfl: ;  Omega-3 Fatty Acids (FISH OIL) 1200 MG CAPS, Take 1,200-2,400 mg by mouth 2 (two) times daily. Take 2 capsules in the morning and 1 capsule in the evening, Disp: , Rfl: ;  pravastatin (PRAVACHOL) 80 MG tablet, Take 80 mg by mouth daily., Disp: , Rfl:  cetirizine (ZYRTEC) 10 MG tablet, Take 10 mg by mouth at bedtime., Disp: , Rfl: ;  HYDROcodone-acetaminophen (NORCO/VICODIN) 5-325 MG per tablet, Take 1-2 tablets by mouth every 6 (six) hours as needed for moderate pain.  (Patient not taking: Reported on 02/15/2014), Disp: 15 tablet, Rfl: 0;  predniSONE (DELTASONE) 20 MG tablet, Take 2 tabs daily x 7 days, Disp: , Rfl:   BP 142/72 mmHg  Pulse 73  Resp 16  Ht 5\' 9"  (1.753 m)  Wt 196 lb (88.905 kg)  BMI 28.93 kg/m2  Body mass index is 28.93 kg/(m^2).           Review of Systems had history of lumbar spine disease with nerve compression left leg which has now resolved following surgery. No history coronary artery disease, stroke, chest pain, dyspnea on exertion, PND, orthopnea. Has generalized weakness of his right leg. Other systems negative and complete review of systems other than history of present illness     Objective:   Physical Exam BP 142/72 mmHg  Pulse 73  Resp 16  Ht 5\' 9"  (1.753 m)  Wt 196 lb (88.905 kg)  BMI 28.93 kg/m2  Gen.-alert and oriented x3 in no apparent distress HEENT normal for age Lungs no rhonchi or wheezing Cardiovascular regular rhythm no murmurs carotid pulses 3+ palpable no bruits audible Abdomen soft nontender no palpable masses Musculoskeletal free of  major deformities-pain in right hip joint with active or passive flexion Skin clear -no rashes Neurologic normal Lower extremities 3+ femoral and dorsalis pedis pulses palpable bilaterally with no edema  Today I ordered a venous duplex exam of  the right leg which I reviewed and interpreted and an arterial study. Patient has normal arterial flow with triphasic waveforms. Patient does have some reflux in the right great saphenous vein with borderline size supplying some small varicosities but no DVT.       Assessment:     Varicose veins right leg-asymptomatic Severe right hip discomfort-not caused by varicose veins or arterial insufficiency    Plan:     Would recommend return visit to orthopedic surgeon for further evaluation of right hip discomfort which seems mechanical in nature and is not related to the venous insufficiency

## 2014-03-10 ENCOUNTER — Encounter: Payer: Self-pay | Admitting: Adult Health Nurse Practitioner

## 2014-04-29 NOTE — H&P (Signed)
[name]  Patient is admitted for right total hip arthroplasty, anterior approach.  Subjective:  Chief Complaint:     Right hip primary OA / pain  HPI: Duane Burns, 79 y.o. male, has a history of pain and functional disability in the right hip(s) due to arthritis and patient has failed non-surgical conservative treatments for greater than 12 weeks to include NSAID's and/or analgesics, use of assistive devices and activity modification.  Onset of symptoms was gradual starting ~6 months ago with gradually worsening course since that time.The patient noted no past surgery on the right hip(s).  Patient currently rates pain in the right hip at 10 out of 10 with activity. Patient has worsening of pain with activity and weight bearing, trendelenberg gait, pain that interfers with activities of daily living and pain with passive range of motion. Patient has evidence of periarticular osteophytes and joint space narrowing by imaging studies. This condition presents safety issues increasing the risk of falls.  There is no current active infection.  Risks, benefits and expectations were discussed with the patient.  Risks including but not limited to the risk of anesthesia, blood clots, nerve damage, blood vessel damage, failure of the prosthesis, infection and up to and including death.  Patient understand the risks, benefits and expectations and wishes to proceed with surgery.   PCP: [name], MD  D/C Plans:      Home with HHPT  Post-op Meds:       No Rx given  Tranexamic Acid:      To be given - IV   Decadron:      Is to be given  FYI:     ASA post-op  Norco post-op    Patient Active Problem List   Diagnosis Date Noted  . Right leg pain 02/15/2014  . Varicose veins without complication 71/07/2692   Past Medical History  Diagnosis Date  . Cancer   . Hyperlipidemia   . Hypertension     Past Surgical History  Procedure Laterality Date  . Back surgery    .  Spine surgery      x's 3  . Prostate surgery      No prescriptions prior to admission   No Known Allergies   History  Substance Use Topics  . Smoking status: Current Every Day Smoker  . Smokeless tobacco: Never Used  . Alcohol Use: No    Family History  Problem Relation Age of Onset  . Diabetes Daughter   . Hypertension Daughter      Review of Systems  Constitutional: Negative.   HENT: Negative.   Eyes: Negative.   Respiratory: Negative.   Cardiovascular: Negative.   Gastrointestinal: Negative.   Genitourinary: Positive for urgency and frequency.  Musculoskeletal: Positive for back pain and joint pain.  Skin: Negative.   Neurological: Negative.   Endo/Heme/Allergies: Negative.   Psychiatric/Behavioral: Negative.     Objective:  Physical Exam  Constitutional: He is oriented to person, place, and time. He appears well-developed and well-nourished.  HENT:  Head: Normocephalic and atraumatic.  Eyes: Pupils are equal, round, and reactive to light.  Neck: Neck supple. No JVD present. No tracheal deviation present. No thyromegaly present.  Cardiovascular: Normal rate, regular rhythm, normal heart sounds and intact distal pulses.   Respiratory: Effort normal and breath sounds normal. No stridor. No respiratory distress. He has no wheezes.  GI: Soft. There is no tenderness. There is no guarding.  Musculoskeletal:       Right  hip: He exhibits decreased range of motion, decreased strength, tenderness and bony tenderness. He exhibits no swelling, no deformity and no laceration.       Left ankle: He exhibits decreased range of motion (drop foot of the left foot).  Lymphadenopathy:    He has no cervical adenopathy.  Neurological: He is alert and oriented to person, place, and time.  Skin: Skin is warm and dry.  Psychiatric: He has a normal mood and affect.      Labs:  Estimated body mass index is 28.93 kg/(m^2) as calculated from the following:   Height as of 02/15/14:  5\' 9"  (1.753 m).   Weight as of 02/15/14: 88.905 kg (196 lb).   Imaging Review Plain radiographs demonstrate severe degenerative joint disease of the right hip(s). The bone quality appears to be good for age and reported activity level.  Assessment/Plan:  End stage arthritis, right hip(s)  The patient history, physical examination, clinical judgement of the provider and imaging studies are consistent with end stage degenerative joint disease of the right hip(s) and total hip arthroplasty is deemed medically necessary. The treatment options including medical management, injection therapy, arthroscopy and arthroplasty were discussed at length. The risks and benefits of total hip arthroplasty were presented and reviewed. The risks due to aseptic loosening, infection, stiffness, dislocation/subluxation,  thromboembolic complications and other imponderables were discussed.  The patient acknowledged the explanation, agreed to proceed with the plan and consent was signed. Patient is being admitted for inpatient treatment for surgery, pain control, PT, OT, prophylactic antibiotics, VTE prophylaxis, progressive ambulation and ADL's and discharge planning.The patient is planning to be discharged home with home health services.     [place] Pugh Duane Hartig   PA-C  04/29/2014, 9:14 PM

## 2014-05-02 ENCOUNTER — Encounter (HOSPITAL_COMMUNITY): Payer: Self-pay

## 2014-05-04 NOTE — Progress Notes (Signed)
Clearance dr nyland chart

## 2014-05-04 NOTE — Patient Instructions (Addendum)
Your procedure is scheduled on:  05/10/14  Tuesday   Report to [facility] at    0500   AM.   Call this number if you have problems the morning of surgery: [phone]        Do not eat food  Or drink :After Midnight.MONDAY NIGHT   Take these medicines the morning of surgery with A SIP OF WATER: NONE   .  Contacts, dentures or partial plates, or metal hairpins  can not be worn to surgery.             Your family will be responsible for glasses, dentures, hearing aides while you are in surgery  Leave suitcase in the car. After surgery it may be brought to your room.  For patients admitted to the hospital, checkout time is 11:00 AM day of  discharge.              [facility] IS NOT RESPONSIBLE FOR ANY VALUABLES  Patients discharged the day of surgery will not be allowed to drive home.             IF going home the day of surgery, you must have a driver and someone to stay with you for the first 24 hours                                                                                                                        [facility] - Preparing for Surgery Before surgery, you can play an important role.  Because skin is not sterile, your skin needs to be as free of germs as possible.  You can reduce the number of germs on your skin by washing with CHG (chlorahexidine gluconate) soap before surgery.  CHG is an antiseptic cleaner which kills germs and bonds with the skin to continue killing germs even after washing. Please DO NOT use if you have an allergy to CHG or antibacterial soaps.  If your skin becomes reddened/irritated stop using the CHG and inform your nurse when you arrive at Short Stay. Do not shave (including legs and underarms) for at least 48 hours prior to the first CHG shower.  You may shave your face/neck. Please follow these instructions carefully:  1.  Shower with CHG Soap the night  before surgery and the  morning of Surgery.  2.  If you choose to wash your hair, wash your hair first as usual with your  normal  shampoo.  3.  After you shampoo, rinse your hair and body thoroughly to remove the  shampoo.                            4.  Use CHG as you would any other liquid soap.  You can apply chg directly  to the skin and wash                   Gently with a scrungie or clean washcloth.   5.  Apply the CHG Soap to your body ONLY FROM THE NECK DOWN.   Do not use on face/ open                           Wound or open sores. Avoid contact with eyes, ears mouth and genitals (private parts).                       Wash face,  Genitals (private parts) with your normal soap.             6.  Wash thoroughly, paying special attention to the area where your surgery  will be performed.  7.  Thoroughly rinse your body with warm water from the neck down.  8.  DO NOT shower/wash with your normal soap after using and rinsing off  the CHG Soap.                9.  Pat yourself dry with a clean towel.            10.  Wear clean pajamas.            11.  Place clean sheets on your bed the night of your first shower and do not  sleep with pets. Day of Surgery : Do not apply any lotions/deodorants the morning of surgery.  Please wear clean clothes to the hospital/surgery center.  FAILURE TO FOLLOW THESE INSTRUCTIONS MAY RESULT IN THE CANCELLATION OF YOUR SURGERY PATIENT SIGNATURE_________________________________  NURSE SIGNATURE__________________________________  ________________________________________________________________________  WHAT IS A BLOOD TRANSFUSION? Blood Transfusion Information  A transfusion is the replacement of blood or some of its parts. Blood is made up of multiple cells which provide different functions.  Red blood cells carry oxygen and are used for blood loss replacement.  White blood cells fight against infection.  Platelets control bleeding.  Plasma helps clot  blood.  Other blood products are available for specialized needs, such as hemophilia or other clotting disorders. BEFORE THE TRANSFUSION  Who gives blood for transfusions?   Healthy volunteers who are fully evaluated to make sure their blood is safe. This is blood bank blood. Transfusion therapy is the safest it has ever been in the practice of medicine. Before blood is taken from a donor, a complete history is taken to make sure that person has no history of diseases nor engages in risky social behavior (examples are intravenous drug use or sexual activity with multiple partners). The donor's travel history is screened to minimize risk of transmitting infections, such as malaria. The donated blood is tested for signs of infectious diseases, such as HIV and hepatitis. The blood is then tested to be sure it is compatible with you in order to minimize the chance of a transfusion reaction. If you or a relative donates blood, this is often done in anticipation of surgery and is not appropriate for emergency situations. It takes many days to process the donated blood. RISKS AND COMPLICATIONS Although transfusion therapy is very safe and saves many lives, the main dangers of transfusion include:  1. Getting an infectious disease. 2. Developing a transfusion reaction. This is an allergic reaction to something in the blood you were given. Every precaution is taken to prevent this. The decision  to have a blood transfusion has been considered carefully by your caregiver before blood is given. Blood is not given unless the benefits outweigh the risks. AFTER THE TRANSFUSION  Right after receiving a blood transfusion, you will usually feel much better and more energetic. This is especially true if your red blood cells have gotten low (anemic). The transfusion raises the level of the red blood cells which carry oxygen, and this usually causes an energy increase.  The nurse administering the transfusion will  monitor you carefully for complications. HOME CARE INSTRUCTIONS  No special instructions are needed after a transfusion. You may find your energy is better. Speak with your caregiver about any limitations on activity for underlying diseases you may have. SEEK MEDICAL CARE IF:   Your condition is not improving after your transfusion.  You develop redness or irritation at the intravenous (IV) site. SEEK IMMEDIATE MEDICAL CARE IF:  Any of the following symptoms occur over the next 12 hours:  Shaking chills.  You have a temperature by mouth above 102 F (38.9 C), not controlled by medicine.  Chest, back, or muscle pain.  People around you feel you are not acting correctly or are confused.  Shortness of breath or difficulty breathing.  Dizziness and fainting.  You get a rash or develop hives.  You have a decrease in urine output.  Your urine turns a dark color or changes to pink, red, or brown. Any of the following symptoms occur over the next 10 days:  You have a temperature by mouth above 102 F (38.9 C), not controlled by medicine.  Shortness of breath.  Weakness after normal activity.  The white part of the eye turns yellow (jaundice).  You have a decrease in the amount of urine or are urinating less often.  Your urine turns a dark color or changes to pink, red, or brown. Document Released: 02/09/2000 Document Revised: 05/06/2011 Document Reviewed: 09/28/2007 Exit[employer] Patient Information 2014 [place].  _______________________________________________________________________  Incentive Spirometer  An incentive spirometer is a tool that can help keep your lungs clear and active. This tool measures how well you are filling your lungs with each breath. Taking long deep breaths may help reverse or decrease the chance of developing breathing (pulmonary) problems (especially infection) following:  A long period of time when you are unable to move or be  active. BEFORE THE PROCEDURE   If the spirometer includes an indicator to show your best effort, your nurse or respiratory therapist will set it to a desired goal.  If possible, sit up straight or lean slightly forward. Try not to slouch.  Hold the incentive spirometer in an upright position. INSTRUCTIONS FOR USE  3. Sit on the edge of your bed if possible, or sit up as far as you can in bed or on a chair. 4. Hold the incentive spirometer in an upright position. 5. Breathe out normally. 6. Place the mouthpiece in your mouth and seal your lips tightly around it. 7. Breathe in slowly and as deeply as possible, raising the piston or the ball toward the top of the column. 8. Hold your breath for 3-5 seconds or for as long as possible. Allow the piston or ball to fall to the bottom of the column. 9. Remove the mouthpiece from your mouth and breathe out normally. 10. Rest for a few seconds and repeat Steps 1 through 7 at least 10 times every 1-2 hours when you are awake. Take your time and take a few normal  breaths between deep breaths. 11. The spirometer may include an indicator to show your best effort. Use the indicator as a goal to work toward during each repetition. 12. After each set of 10 deep breaths, practice coughing to be sure your lungs are clear. If you have an incision (the cut made at the time of surgery), support your incision when coughing by placing a pillow or rolled up towels firmly against it. Once you are able to get out of bed, walk around indoors and cough well. You may stop using the incentive spirometer when instructed by your caregiver.  RISKS AND COMPLICATIONS  Take your time so you do not get dizzy or light-headed.  If you are in pain, you may need to take or ask for pain medication before doing incentive spirometry. It is harder to take a deep breath if you are having pain. AFTER USE  Rest and breathe slowly and easily.  It can be helpful to keep track of a log of  your progress. Your caregiver can provide you with a simple table to help with this. If you are using the spirometer at home, follow these instructions: [place] IF:   You are having difficultly using the spirometer.  You have trouble using the spirometer as often as instructed.  Your pain medication is not giving enough relief while using the spirometer.  You develop fever of 100.5 F (38.1 C) or higher. SEEK IMMEDIATE MEDICAL CARE IF:   You cough up bloody sputum that had not been present before.  You develop fever of 102 F (38.9 C) or greater.  You develop worsening pain at or near the incision site. MAKE SURE YOU:   Understand these instructions.  Will watch your condition.  Will get help right away if you are not doing well or get worse. Document Released: 06/24/2006 Document Revised: 05/06/2011 Document Reviewed: 08/25/2006 [facility] Patient Information 2014 [place], [place].   ________________________________________________________________________

## 2014-05-05 ENCOUNTER — Encounter (HOSPITAL_COMMUNITY): Payer: Self-pay

## 2014-05-05 ENCOUNTER — Encounter (HOSPITAL_COMMUNITY)
Admission: RE | Admit: 2014-05-05 | Discharge: 2014-05-05 | Disposition: A | Discharge status: Home / Self Care | Payer: Medicare HMO | Source: Ambulatory Visit | Attending: Orthopedic Surgery | Admitting: Orthopedic Surgery

## 2014-05-05 DIAGNOSIS — M1611 Unilateral primary osteoarthritis, right hip: Secondary | ICD-10-CM | POA: Diagnosis not present

## 2014-05-05 DIAGNOSIS — Z01818 Encounter for other preprocedural examination: Secondary | ICD-10-CM | POA: Insufficient documentation

## 2014-05-05 HISTORY — DX: Pneumonia, unspecified organism: J18.9

## 2014-05-05 HISTORY — DX: Urgency of urination: R39.15

## 2014-05-05 HISTORY — DX: Foot drop, left foot: M21.372

## 2014-05-05 HISTORY — DX: Intervertebral disc disorders with myelopathy, thoracic region: M51.04

## 2014-05-05 HISTORY — DX: Unspecified urinary incontinence: R32

## 2014-05-05 HISTORY — DX: Unspecified osteoarthritis, unspecified site: M19.90

## 2014-05-05 LAB — CBC
HEMATOCRIT: 42.7 % (ref 39.0–52.0)
Hemoglobin: 13.6 g/dL (ref 13.0–17.0)
MCH: 28.4 pg (ref 26.0–34.0)
MCHC: 31.9 g/dL (ref 30.0–36.0)
MCV: 89.1 fL (ref 78.0–100.0)
Platelets: 296 10*3/uL (ref 150–400)
RBC: 4.79 MIL/uL (ref 4.22–5.81)
RDW: 14.5 % (ref 11.5–15.5)
WBC: 10 10*3/uL (ref 4.0–10.5)

## 2014-05-05 LAB — URINALYSIS, ROUTINE W REFLEX MICROSCOPIC
Glucose, UA: NEGATIVE mg/dL
Hgb urine dipstick: NEGATIVE
Ketones, ur: NEGATIVE mg/dL
LEUKOCYTES UA: NEGATIVE
NITRITE: NEGATIVE
Protein, ur: NEGATIVE mg/dL
Specific Gravity, Urine: 1.027 (ref 1.005–1.030)
UROBILINOGEN UA: 0.2 mg/dL (ref 0.0–1.0)
pH: 5 (ref 5.0–8.0)

## 2014-05-05 LAB — BASIC METABOLIC PANEL
Anion gap: 8 (ref 5–15)
BUN: 24 mg/dL — ABNORMAL HIGH (ref 6–23)
CO2: 27 mmol/L (ref 19–32)
Calcium: 9.4 mg/dL (ref 8.4–10.5)
Chloride: 107 mmol/L (ref 96–112)
Creatinine, Ser: 1.27 mg/dL (ref 0.50–1.35)
GFR calc Af Amer: 62 mL/min — ABNORMAL LOW (ref >90)
GFR, EST NON AFRICAN AMERICAN: 53 mL/min — AB (ref >90)
Glucose, Bld: 95 mg/dL (ref 70–99)
Potassium: 4.2 mmol/L (ref 3.5–5.1)
Sodium: 142 mmol/L (ref 135–145)

## 2014-05-05 LAB — PROTIME-INR
INR: 1.08 (ref 0.00–1.49)
Prothrombin Time: 14.1 seconds (ref 11.6–15.2)

## 2014-05-05 LAB — SURGICAL PCR SCREEN
MRSA, PCR: NEGATIVE
Staphylococcus aureus: NEGATIVE

## 2014-05-05 LAB — APTT: aPTT: 30 seconds (ref 24–37)

## 2014-05-05 NOTE — Progress Notes (Signed)
   05/05/14 0933  OBSTRUCTIVE SLEEP APNEA  Have you ever been diagnosed with sleep apnea through a sleep study? No  Do you snore loudly (loud enough to be heard through closed doors)?  0  Do you often feel tired, fatigued, or sleepy during the daytime? 0  Has anyone observed you stop breathing during your sleep? 0  Do you have, or are you being treated for high blood pressure? 1  BMI more than 35 kg/m2? 0  Age over 77 years old? 1  Neck circumference greater than 40 cm/16 inches? 1  Gender: 1

## 2014-05-09 NOTE — Anesthesia Preprocedure Evaluation (Addendum)
Anesthesia Evaluation  Patient identified by MRN, date of birth, ID band Patient awake    Reviewed: Allergy & Precautions, NPO status , Patient's Chart, lab work & pertinent test results, Unable to perform ROS - Chart review only  History of Anesthesia Complications Negative for: history of anesthetic complications  Airway Mallampati: II  TM Distance: >3 FB Neck ROM: Full    Dental  (+) Teeth Intact, Dental Advisory Given   Pulmonary former smoker,    Pulmonary exam normal       Cardiovascular hypertension, Pt. on medications     Neuro/Psych negative neurological ROS  negative psych ROS   GI/Hepatic negative GI ROS, Neg liver ROS,   Endo/Other  negative endocrine ROS  Renal/GU negative Renal ROS     Musculoskeletal   Abdominal   Peds  Hematology   Anesthesia Other Findings   Reproductive/Obstetrics                            Anesthesia Physical Anesthesia Plan  ASA: III  Anesthesia Plan: MAC and Spinal   Post-op Pain Management:    Induction:   Airway Management Planned: Simple Face Mask  Additional Equipment:   Intra-op Plan:   Post-operative Plan:   Informed Consent: I have reviewed the patients History and Physical, chart, labs and discussed the procedure including the risks, benefits and alternatives for the proposed anesthesia with the patient or authorized representative who has indicated his/her understanding and acceptance.   Dental advisory given  Plan Discussed with: CRNA, Anesthesiologist and Surgeon  Anesthesia Plan Comments:        Anesthesia Quick Evaluation

## 2014-05-10 ENCOUNTER — Inpatient Hospital Stay (HOSPITAL_COMMUNITY): Payer: Medicare HMO

## 2014-05-10 ENCOUNTER — Encounter (HOSPITAL_COMMUNITY)
Admission: RE | Disposition: A | Discharge status: Home Health | Payer: Self-pay | Source: Ambulatory Visit | Attending: Orthopedic Surgery

## 2014-05-10 ENCOUNTER — Inpatient Hospital Stay (HOSPITAL_COMMUNITY)
Admission: RE | Admit: 2014-05-10 | Discharge: 2014-05-11 | DRG: 470 | Disposition: A | Discharge status: Home Health | Payer: Medicare HMO | Source: Ambulatory Visit | Attending: Orthopedic Surgery | Admitting: Orthopedic Surgery

## 2014-05-10 ENCOUNTER — Inpatient Hospital Stay (HOSPITAL_COMMUNITY): Payer: Medicare HMO | Admitting: Anesthesiology

## 2014-05-10 ENCOUNTER — Encounter (HOSPITAL_COMMUNITY): Payer: Self-pay | Admitting: *Deleted

## 2014-05-10 DIAGNOSIS — Z9079 Acquired absence of other genital organ(s): Secondary | ICD-10-CM | POA: Diagnosis present

## 2014-05-10 DIAGNOSIS — Z96651 Presence of right artificial knee joint: Secondary | ICD-10-CM

## 2014-05-10 DIAGNOSIS — Z96649 Presence of unspecified artificial hip joint: Secondary | ICD-10-CM

## 2014-05-10 DIAGNOSIS — Z833 Family history of diabetes mellitus: Secondary | ICD-10-CM

## 2014-05-10 DIAGNOSIS — M21372 Foot drop, left foot: Secondary | ICD-10-CM | POA: Diagnosis present

## 2014-05-10 DIAGNOSIS — Z8546 Personal history of malignant neoplasm of prostate: Secondary | ICD-10-CM

## 2014-05-10 DIAGNOSIS — E785 Hyperlipidemia, unspecified: Secondary | ICD-10-CM | POA: Diagnosis present

## 2014-05-10 DIAGNOSIS — M25551 Pain in right hip: Secondary | ICD-10-CM | POA: Diagnosis present

## 2014-05-10 DIAGNOSIS — M1611 Unilateral primary osteoarthritis, right hip: Principal | ICD-10-CM | POA: Diagnosis present

## 2014-05-10 DIAGNOSIS — E669 Obesity, unspecified: Secondary | ICD-10-CM | POA: Diagnosis present

## 2014-05-10 DIAGNOSIS — I10 Essential (primary) hypertension: Secondary | ICD-10-CM | POA: Diagnosis present

## 2014-05-10 DIAGNOSIS — Z6830 Body mass index (BMI) 30.0-30.9, adult: Secondary | ICD-10-CM

## 2014-05-10 DIAGNOSIS — Z01812 Encounter for preprocedural laboratory examination: Secondary | ICD-10-CM | POA: Diagnosis not present

## 2014-05-10 DIAGNOSIS — N359 Urethral stricture, unspecified: Secondary | ICD-10-CM | POA: Diagnosis present

## 2014-05-10 DIAGNOSIS — Z85828 Personal history of other malignant neoplasm of skin: Secondary | ICD-10-CM

## 2014-05-10 HISTORY — PX: CYSTOSCOPY WITH URETHRAL DILATATION: SHX5125

## 2014-05-10 HISTORY — PX: TOTAL HIP ARTHROPLASTY: SHX124

## 2014-05-10 LAB — TYPE AND SCREEN
ABO/RH(D): B POS
Antibody Screen: NEGATIVE

## 2014-05-10 SURGERY — ARTHROPLASTY, HIP, TOTAL, ANTERIOR APPROACH
Anesthesia: Monitor Anesthesia Care | Site: Urethra | Laterality: Right

## 2014-05-10 MED ORDER — HYDROMORPHONE HCL 1 MG/ML IJ SOLN: 0.2500 mg | INTRAMUSCULAR | Status: DC | PRN

## 2014-05-10 MED ORDER — PROPOFOL INFUSION 10 MG/ML OPTIME
INTRAVENOUS | Status: DC | PRN
  Administered 2014-05-10: 140 ug/kg/min via INTRAVENOUS

## 2014-05-10 MED ORDER — ONDANSETRON HCL 4 MG/2ML IJ SOLN
INTRAMUSCULAR | Status: DC | PRN
  Administered 2014-05-10: 4 mg via INTRAVENOUS

## 2014-05-10 MED ORDER — OXYCODONE HCL 5 MG/5ML PO SOLN
5.0000 mg | Freq: Once | ORAL | Status: DC | PRN
  Filled 2014-05-10: qty 5

## 2014-05-10 MED ORDER — METOCLOPRAMIDE HCL 5 MG/ML IJ SOLN: 5.0000 mg | Freq: Three times a day (TID) | INTRAMUSCULAR | Status: DC | PRN

## 2014-05-10 MED ORDER — GENTAMICIN SULFATE 40 MG/ML IJ SOLN
5.0000 mg/kg | INTRAVENOUS | Status: DC
  Administered 2014-05-10: 80 mg via INTRAVENOUS
  Filled 2014-05-10: qty 9.75

## 2014-05-10 MED ORDER — DIPHENHYDRAMINE HCL 25 MG PO CAPS: 25.0000 mg | ORAL_CAPSULE | Freq: Four times a day (QID) | ORAL | Status: DC | PRN

## 2014-05-10 MED ORDER — OXYCODONE HCL 5 MG PO TABS: 5.0000 mg | ORAL_TABLET | Freq: Once | ORAL | Status: DC | PRN

## 2014-05-10 MED ORDER — PHENYLEPHRINE 40 MCG/ML (10ML) SYRINGE FOR IV PUSH (FOR BLOOD PRESSURE SUPPORT)
PREFILLED_SYRINGE | INTRAVENOUS | Status: AC
  Filled 2014-05-10: qty 10

## 2014-05-10 MED ORDER — LACTATED RINGERS IV SOLN
INTRAVENOUS | Status: DC | PRN
  Administered 2014-05-10 (×2): via INTRAVENOUS

## 2014-05-10 MED ORDER — METOCLOPRAMIDE HCL 10 MG PO TABS
5.0000 mg | ORAL_TABLET | Freq: Three times a day (TID) | ORAL | Status: DC | PRN
Start: 2014-05-10 — End: 2014-05-11

## 2014-05-10 MED ORDER — PROPOFOL 10 MG/ML IV BOLUS
INTRAVENOUS | Status: AC
  Filled 2014-05-10: qty 20

## 2014-05-10 MED ORDER — METHOCARBAMOL 500 MG PO TABS: 500.0000 mg | ORAL_TABLET | Freq: Four times a day (QID) | ORAL | Status: DC | PRN

## 2014-05-10 MED ORDER — PHENOL 1.4 % MT LIQD: 1.0000 | OROMUCOSAL | Status: DC | PRN

## 2014-05-10 MED ORDER — LORATADINE 10 MG PO TABS
10.0000 mg | ORAL_TABLET | Freq: Every day | ORAL | Status: DC
  Administered 2014-05-10 – 2014-05-11 (×2): 10 mg via ORAL
  Filled 2014-05-10 (×2): qty 1

## 2014-05-10 MED ORDER — MIDAZOLAM HCL 2 MG/2ML IJ SOLN
INTRAMUSCULAR | Status: AC
Start: 2014-05-10 — End: 2014-05-10
  Filled 2014-05-10: qty 2

## 2014-05-10 MED ORDER — PRAVASTATIN SODIUM 80 MG PO TABS
80.0000 mg | ORAL_TABLET | Freq: Every day | ORAL | Status: DC
  Administered 2014-05-10: 80 mg via ORAL
  Filled 2014-05-10 (×2): qty 1

## 2014-05-10 MED ORDER — POLYETHYLENE GLYCOL 3350 17 G PO PACK
17.0000 g | PACK | Freq: Two times a day (BID) | ORAL | Status: DC
  Administered 2014-05-11: 17 g via ORAL

## 2014-05-10 MED ORDER — HYDROCODONE-ACETAMINOPHEN 7.5-325 MG PO TABS
1.0000 | ORAL_TABLET | ORAL | Status: DC
  Administered 2014-05-10 – 2014-05-11 (×6): 2 via ORAL
  Filled 2014-05-10 (×6): qty 2

## 2014-05-10 MED ORDER — SODIUM CHLORIDE 0.9 % IR SOLN
Status: DC | PRN
  Administered 2014-05-10: 1000 mL

## 2014-05-10 MED ORDER — DOCUSATE SODIUM 100 MG PO CAPS
100.0000 mg | ORAL_CAPSULE | Freq: Two times a day (BID) | ORAL | Status: DC
  Administered 2014-05-10 – 2014-05-11 (×3): 100 mg via ORAL

## 2014-05-10 MED ORDER — MIDAZOLAM HCL 5 MG/5ML IJ SOLN
INTRAMUSCULAR | Status: DC | PRN
  Administered 2014-05-10 (×2): 1 mg via INTRAVENOUS

## 2014-05-10 MED ORDER — GENTAMICIN IN SALINE 1.6-0.9 MG/ML-% IV SOLN: 80.0000 mg | Freq: Once | INTRAVENOUS | Status: DC

## 2014-05-10 MED ORDER — MAGNESIUM CITRATE PO SOLN: 1.0000 | Freq: Once | ORAL | Status: AC | PRN

## 2014-05-10 MED ORDER — CEFAZOLIN SODIUM-DEXTROSE 2-3 GM-% IV SOLR
2.0000 g | INTRAVENOUS | Status: AC
  Administered 2014-05-10: 2 g via INTRAVENOUS

## 2014-05-10 MED ORDER — CHLORHEXIDINE GLUCONATE 4 % EX LIQD: 60.0000 mL | Freq: Once | CUTANEOUS | Status: DC

## 2014-05-10 MED ORDER — ONDANSETRON HCL 4 MG/2ML IJ SOLN: 4.0000 mg | Freq: Four times a day (QID) | INTRAMUSCULAR | Status: DC | PRN

## 2014-05-10 MED ORDER — CEFAZOLIN SODIUM-DEXTROSE 2-3 GM-% IV SOLR
INTRAVENOUS | Status: AC
  Filled 2014-05-10: qty 50

## 2014-05-10 MED ORDER — CELECOXIB 200 MG PO CAPS
200.0000 mg | ORAL_CAPSULE | Freq: Two times a day (BID) | ORAL | Status: DC
  Administered 2014-05-10 – 2014-05-11 (×3): 200 mg via ORAL
  Filled 2014-05-10 (×4): qty 1

## 2014-05-10 MED ORDER — ONDANSETRON HCL 4 MG/2ML IJ SOLN
INTRAMUSCULAR | Status: AC
  Filled 2014-05-10: qty 2

## 2014-05-10 MED ORDER — ONDANSETRON HCL 4 MG PO TABS: 4.0000 mg | ORAL_TABLET | Freq: Four times a day (QID) | ORAL | Status: DC | PRN

## 2014-05-10 MED ORDER — PROMETHAZINE HCL 25 MG/ML IJ SOLN: 6.2500 mg | INTRAMUSCULAR | Status: DC | PRN

## 2014-05-10 MED ORDER — CEFAZOLIN SODIUM-DEXTROSE 2-3 GM-% IV SOLR
2.0000 g | Freq: Four times a day (QID) | INTRAVENOUS | Status: AC
  Administered 2014-05-10 (×2): 2 g via INTRAVENOUS
  Filled 2014-05-10 (×2): qty 50

## 2014-05-10 MED ORDER — LACTATED RINGERS IV SOLN: INTRAVENOUS | Status: DC

## 2014-05-10 MED ORDER — BISACODYL 10 MG RE SUPP: 10.0000 mg | Freq: Every day | RECTAL | Status: DC | PRN

## 2014-05-10 MED ORDER — TRANEXAMIC ACID 100 MG/ML IV SOLN
1000.0000 mg | Freq: Once | INTRAVENOUS | Status: AC
  Administered 2014-05-10: 1000 mg via INTRAVENOUS
  Filled 2014-05-10: qty 10

## 2014-05-10 MED ORDER — ALUM & MAG HYDROXIDE-SIMETH 200-200-20 MG/5ML PO SUSP: 30.0000 mL | ORAL | Status: DC | PRN

## 2014-05-10 MED ORDER — BUPIVACAINE IN DEXTROSE 0.75-8.25 % IT SOLN
INTRATHECAL | Status: DC | PRN
  Administered 2014-05-10: 15 mg via INTRATHECAL

## 2014-05-10 MED ORDER — HYDROMORPHONE HCL 1 MG/ML IJ SOLN: 0.5000 mg | INTRAMUSCULAR | Status: DC | PRN

## 2014-05-10 MED ORDER — PHENYLEPHRINE HCL 10 MG/ML IJ SOLN
INTRAMUSCULAR | Status: DC | PRN
  Administered 2014-05-10 (×6): 80 ug via INTRAVENOUS

## 2014-05-10 MED ORDER — DEXAMETHASONE SODIUM PHOSPHATE 10 MG/ML IJ SOLN
INTRAMUSCULAR | Status: AC
  Filled 2014-05-10: qty 1

## 2014-05-10 MED ORDER — DEXAMETHASONE SODIUM PHOSPHATE 10 MG/ML IJ SOLN
10.0000 mg | Freq: Once | INTRAMUSCULAR | Status: AC
  Administered 2014-05-10: 10 mg via INTRAVENOUS

## 2014-05-10 MED ORDER — DEXAMETHASONE SODIUM PHOSPHATE 10 MG/ML IJ SOLN
10.0000 mg | Freq: Once | INTRAMUSCULAR | Status: AC
  Administered 2014-05-11: 10 mg via INTRAVENOUS

## 2014-05-10 MED ORDER — FENTANYL CITRATE 0.05 MG/ML IJ SOLN
INTRAMUSCULAR | Status: DC | PRN
  Administered 2014-05-10 (×2): 50 ug via INTRAVENOUS

## 2014-05-10 MED ORDER — ASPIRIN EC 325 MG PO TBEC
325.0000 mg | DELAYED_RELEASE_TABLET | Freq: Two times a day (BID) | ORAL | Status: DC
  Administered 2014-05-11: 325 mg via ORAL
  Filled 2014-05-10 (×3): qty 1

## 2014-05-10 MED ORDER — METHOCARBAMOL 1000 MG/10ML IJ SOLN
500.0000 mg | Freq: Four times a day (QID) | INTRAVENOUS | Status: DC | PRN
  Filled 2014-05-10: qty 5

## 2014-05-10 MED ORDER — SODIUM CHLORIDE 0.9 % IV SOLN
100.0000 mL/h | INTRAVENOUS | Status: DC
  Administered 2014-05-10 – 2014-05-11 (×3): 100 mL/h via INTRAVENOUS
  Filled 2014-05-10 (×11): qty 1000

## 2014-05-10 MED ORDER — FENTANYL CITRATE 0.05 MG/ML IJ SOLN
INTRAMUSCULAR | Status: AC
  Filled 2014-05-10: qty 2

## 2014-05-10 MED ORDER — FERROUS SULFATE 325 (65 FE) MG PO TABS
325.0000 mg | ORAL_TABLET | Freq: Three times a day (TID) | ORAL | Status: DC
  Administered 2014-05-10 – 2014-05-11 (×2): 325 mg via ORAL
  Filled 2014-05-10 (×5): qty 1

## 2014-05-10 MED ORDER — PHENYLEPHRINE HCL 10 MG/ML IJ SOLN: INTRAMUSCULAR | Status: DC | PRN

## 2014-05-10 MED ORDER — GENTAMICIN IN SALINE 1.6-0.9 MG/ML-% IV SOLN
80.0000 mg | Freq: Once | INTRAVENOUS | Status: DC
  Filled 2014-05-10: qty 50

## 2014-05-10 MED ORDER — MENTHOL 3 MG MT LOZG: 1.0000 | LOZENGE | OROMUCOSAL | Status: DC | PRN

## 2014-05-10 SURGICAL SUPPLY — 52 items
ADH SKN CLS APL DERMABOND .7 (GAUZE/BANDAGES/DRESSINGS) ×2
BAG DECANTER FOR FLEXI CONT (MISCELLANEOUS) IMPLANT
BAG SPEC THK2 15X12 ZIP CLS (MISCELLANEOUS)
BAG ZIPLOCK 12X15 (MISCELLANEOUS) IMPLANT
CAPT HIP TOTAL 2 ×1 IMPLANT
CATH FOLEY 2W COUNCIL 20FR 5CC (CATHETERS) ×1 IMPLANT
CATH X-FORCE N30 NEPHROSTOMY (TUBING) ×1 IMPLANT
COVER PERINEAL POST (MISCELLANEOUS) ×3 IMPLANT
DERMABOND ADVANCED (GAUZE/BANDAGES/DRESSINGS) ×1
DERMABOND ADVANCED .7 DNX12 (GAUZE/BANDAGES/DRESSINGS) IMPLANT
DEVICE INFLATION 20CC 30ATM (MISCELLANEOUS) ×1 IMPLANT
DRAPE C-ARM 42X120 X-RAY (DRAPES) ×3 IMPLANT
DRAPE STERI IOBAN 125X83 (DRAPES) ×3 IMPLANT
DRAPE U-SHAPE 47X51 STRL (DRAPES) ×9 IMPLANT
DRSG AQUACEL AG ADV 3.5X10 (GAUZE/BANDAGES/DRESSINGS) ×3 IMPLANT
DURAPREP 26ML APPLICATOR (WOUND CARE) ×3 IMPLANT
ELECT BLADE TIP CTD 4 INCH (ELECTRODE) ×3 IMPLANT
ELECT PENCIL ROCKER SW 15FT (MISCELLANEOUS) IMPLANT
ELECT REM PT RETURN 15FT ADLT (MISCELLANEOUS) IMPLANT
ELECT REM PT RETURN 9FT ADLT (ELECTROSURGICAL) ×3
ELECTRODE REM PT RTRN 9FT ADLT (ELECTROSURGICAL) ×2 IMPLANT
ELIMINATOR HOLE APEX DEPUY (Hips) ×1 IMPLANT
FACESHIELD WRAPAROUND (MASK) ×12 IMPLANT
FACESHIELD WRAPAROUND OR TEAM (MASK) ×8 IMPLANT
GLOVE BIOGEL PI IND STRL 7.5 (GLOVE) ×2 IMPLANT
GLOVE BIOGEL PI IND STRL 8.5 (GLOVE) ×2 IMPLANT
GLOVE BIOGEL PI INDICATOR 7.5 (GLOVE) ×1
GLOVE BIOGEL PI INDICATOR 8.5 (GLOVE) ×1
GLOVE ECLIPSE 8.0 STRL XLNG CF (GLOVE) ×5 IMPLANT
GLOVE ORTHO TXT STRL SZ7.5 (GLOVE) ×4 IMPLANT
GOWN SPEC L3 XXLG W/TWL (GOWN DISPOSABLE) ×3 IMPLANT
GOWN STRL REUS W/TWL LRG LVL3 (GOWN DISPOSABLE) ×3 IMPLANT
GUIDEWIRE STR DUAL SENSOR (WIRE) ×1 IMPLANT
HOLDER FOLEY CATH W/STRAP (MISCELLANEOUS) ×3 IMPLANT
KIT BASIN OR (CUSTOM PROCEDURE TRAY) ×3 IMPLANT
LIQUID BAND (GAUZE/BANDAGES/DRESSINGS) ×3 IMPLANT
NDL SAFETY ECLIPSE 18X1.5 (NEEDLE) IMPLANT
NEEDLE HYPO 18GX1.5 SHARP (NEEDLE)
PACK TOTAL JOINT (CUSTOM PROCEDURE TRAY) ×3 IMPLANT
PEN SKIN MARKING BROAD (MISCELLANEOUS) ×3 IMPLANT
SAW OSC TIP CART 19.5X105X1.3 (SAW) ×3 IMPLANT
SUT MNCRL AB 4-0 PS2 18 (SUTURE) ×3 IMPLANT
SUT VIC AB 1 CT1 36 (SUTURE) ×9 IMPLANT
SUT VIC AB 2-0 CT1 27 (SUTURE) ×6
SUT VIC AB 2-0 CT1 TAPERPNT 27 (SUTURE) ×4 IMPLANT
SUT VLOC 180 0 24IN GS25 (SUTURE) ×3 IMPLANT
SYR 50ML LL SCALE MARK (SYRINGE) IMPLANT
TOWEL OR 17X26 10 PK STRL BLUE (TOWEL DISPOSABLE) ×3 IMPLANT
TOWEL OR NON WOVEN STRL DISP B (DISPOSABLE) IMPLANT
TRAY FOLEY CATH 14FRSI W/METER (CATHETERS) ×3 IMPLANT
WATER STERILE IRR 1500ML POUR (IV SOLUTION) ×3 IMPLANT
YANKAUER SUCT BULB TIP NO VENT (SUCTIONS) ×3 IMPLANT

## 2014-05-10 NOTE — Transfer of Care (Signed)
Immediate Anesthesia Transfer of Care Note  Patient: Duane Burns  Procedure(s) Performed: Procedure(s) with comments: RIGHT TOTAL HIP ARTHROPLASTY ANTERIOR APPROACH  (Right) CYSTOSCOPY WITH URETHRAL DILATATION (N/A) - with catheter placement, prior to surgery. procedure performed on the stretcher.  Patient Location: PACU  Anesthesia Type:MAC and Spinal  Level of Consciousness: sedated and patient cooperative  Airway & Oxygen Therapy: Patient Spontanous Breathing and Patient connected to face mask oxygen  Post-op Assessment: Report given to RN and Post -op Vital signs reviewed and stable  Post vital signs: Reviewed and stable  Last Vitals:  Filed Vitals:   05/10/14 0920  BP: 106/53  Pulse:   Temp:   Resp:     Complications: No apparent anesthesia complications

## 2014-05-10 NOTE — Anesthesia Postprocedure Evaluation (Signed)
Anesthesia Post Note  Patient: Duane Burns  Procedure(s) Performed: Procedure(s) (LRB): RIGHT TOTAL HIP ARTHROPLASTY ANTERIOR APPROACH  (Right) CYSTOSCOPY WITH URETHRAL DILATATION (N/A)  Anesthesia type: MAC/SAB  Patient location: PACU  Post pain: Pain level controlled  Post assessment: Patient's Cardiovascular Status Stable  Last Vitals:  Filed Vitals:   05/10/14 1015  BP: 127/61  Pulse: 64  Temp: 36.6 C  Resp: 23    Post vital signs: Reviewed and stable  Level of consciousness: sedated  Complications: No apparent anesthesia complications

## 2014-05-10 NOTE — Brief Op Note (Signed)
05/10/2014  7:56 AM  PATIENT:  Duane Burns  79 y.o. male  PRE-OPERATIVE DIAGNOSIS:  Urethral Stricture  POST-OPERATIVE DIAGNOSIS:  * No post-op diagnosis entered *  PROCEDURE:  Cysto with urethral dilation and catheter placement.  SURGEON:  Phebe Colla MD  PHYSICIAN ASSISTANT:     ANESTHESIA:   spinal  EBL:     BLOOD ADMINISTERED:none  DRAINS: 77F council catheter to gravity drain   LOCAL MEDICATIONS USED:  NONE  SPECIMEN:  No Specimen  DISPOSITION OF SPECIMEN:  N/A  COUNTS:  YES  TOURNIQUET:  * No tourniquets in log *  DICTATION: .Other Dictation: Dictation Number [id number]  PLAN OF CARE: Admit to inpatient   PATIENT DISPOSITION:  remain in OR or hip replacement.   Delay start of Pharmacological VTE agent (>24hrs) due to surgical blood loss or risk of bleeding: not applicable

## 2014-05-10 NOTE — Op Note (Signed)
NAMEAUBURN, HERT               ACCOUNT NO.:  [id number]  MEDICAL RECORD NO.:  [id number]  LOCATION:                                 FACILITY:  PHYSICIAN:  Alexis Frock, MD     DATE OF BIRTH:  02-16-1936  DATE OF PROCEDURE:  05/10/2014                             OPERATIVE REPORT   DIAGNOSIS:  Urethral stricture, difficult to Foley.  PROCEDURE:  Cystoscopy with urethral dilation and catheter placement, complicated.  ESTIMATED BLOOD LOSS:  Nil.  COMPLICATIONS:  None.  SPECIMEN:  None.  DRAINS:  20-French Councill catheter straight drain.  INDICATION:  The patient is a pleasant 79 year old with history of prostate cancer, status post prostatectomy who is followed in our office by the patient.  He is undergoing right hip arthroplasty today under the care of Dr. Alvan Dame.  After the induction of spinal anesthetic, it was noted to have very difficult placement of Foley catheter, therefore Urology consultation was sought.  A single attempt was made at placement of a 16-French Coude catheter and this revealed likely penile stricture. As such, it was felt that cystoscopy with possible dilation was warranted.  This was discussed with the patient as he was awake and he voiced verbal consent.  DESCRIPTION:  The patient being Trejan Buda verified.  Procedure being cystoscopy with urethral dilation was confirmed, procedure was carried out.  Intravenous antibiotics were confirmed and additionally 88 mg gentamicin was given.  A flexible cystourethroscopy was performed using a 16-French flexible cystoscope with sterile water and the distal shaft. There was a very high grade short segment appearing stricture.  This appeared to be amenable to dilation.  A 0.038 zip wire was advanced across this over which a NephroMax balloon dilation apparatus was carefully positioned.  This was inflated to a pressure of 18 atmospheres, held for 90 seconds and then taken down.  Cystoscopy then revealed  complete resolution of the stricture and widely patent urethra to the level of the urinary bladder.  Inspection of the bladder revealed no diverticula, calcifications, papular lesions.  The prostate was surgically absent.  The wire was left in place and a new 20-French Councill catheter was placed over this per urethra to straight drain, 10 mL of sterile water in the balloon.  Procedure was then terminated.  The patient tolerated the procedure well.  There were no immediate periprocedural complications.  The patient has been turned over to the Orthopedic Surgery team for the remainder of the procedures today.          ______________________________ Alexis Frock, MD     TM/MEDQ  D:  05/10/2014  T:  05/10/2014  Job:  [id number]

## 2014-05-10 NOTE — Interval H&P Note (Signed)
History and Physical Interval Note:  05/10/2014 6:58 AM  Duane Burns  has presented today for surgery, with the diagnosis of RIGHT HIP OA  The various methods of treatment have been discussed with the patient and family. After consideration of risks, benefits and other options for treatment, the patient has consented to  Procedure(s): RIGHT TOTAL HIP ARTHROPLASTY ANTERIOR APPROACH  (Right) as a surgical intervention .  The patient's history has been reviewed, patient examined, no change in status, stable for surgery.  I have reviewed the patient's chart and labs.  Questions were answered to the patient's satisfaction.     Mauri Pole

## 2014-05-10 NOTE — Evaluation (Signed)
Physical Therapy Evaluation Patient Details Name: Duane Burns MRN: [id number] DOB: [birth year] Today's Date: 05/10/2014   History of Present Illness  s/p R THA direct anterior. History of back surgery and L foot drop for which he wears a brace (AFO) .   Clinical Impression  S/p R THA presents with decreased mobility to benefit from PT to increase safety and mobility to transition home with wife assisting.     Follow Up Recommendations Home health PT    Equipment Recommendations       Recommendations for Other Services       Precautions / Restrictions Precautions Precautions: None Restrictions Weight Bearing Restrictions: No      Mobility  Bed Mobility Overal bed mobility: Needs Assistance Bed Mobility: Supine to Sit;Sit to Supine     Supine to sit: Min assist     General bed mobility comments: assit with upper body, rail, HOB elevated and assit with LLE  Transfers Overall transfer level: Needs assistance Equipment used: Rolling walker (2 wheeled) Transfers: Sit to/from Stand Sit to Stand: Min assist         General transfer comment: cues for RW safety, likes to start with unusual grip on RW.   Ambulation/Gait Ambulation/Gait assistance: Min assist;+2 safety/equipment Ambulation Distance (Feet): 35 Feet Assistive device: Rolling walker (2 wheeled) Gait Pattern/deviations: Step-to pattern     General Gait Details: constant cues for sequencing due to pt likes to strat gait with LLE instead of Right. and next time will don shoes and AFO for LE .   Stairs            Wheelchair Mobility    Modified Rankin (Stroke Patients Only)       Balance                                             Pertinent Vitals/Pain Pain Assessment: 0-10 Pain Score: 3  Pain Location: R hip area Pain Descriptors / Indicators: Aching Pain Intervention(s): Monitored during session;Premedicated before session;Ice applied    Home Living  Family/patient expects to be discharged to:: Private residence Living Arrangements: Spouse/significant other Available Help at Discharge: Family Type of Home: House Home Access: Stairs to enter Entrance Stairs-Rails: Can reach both Entrance Stairs-Number of Steps: 5-6 Home Layout: One level;Able to live on main level with bedroom/bathroom Home Equipment: Gilford Rile - 2 wheels;Other (comment) (L AFO foot brace)      Prior Function Level of Independence: Independent with assistive device(s)         Comments: has been using the RW for last few months     Hand Dominance        Extremity/Trunk Assessment               Lower Extremity Assessment: Overall WFL for tasks assessed (slight wekaness post surgery on R LE , required AAROM for exercises)         Communication   Communication: No difficulties  Cognition Arousal/Alertness: Awake/alert Behavior During Therapy: WFL for tasks assessed/performed Overall Cognitive Status: Within Functional Limits for tasks assessed                      General Comments      Exercises Total Joint Exercises Ankle Circles/Pumps: AROM;Right;10 reps;Supine Quad Sets: Supine;AROM;Right;10 reps Heel Slides: AAROM;Supine;Right;10 reps Hip ABduction/ADduction: AAROM;Supine;Right;10 reps Straight Leg Raises: AAROM;Supine;Right;10 reps  Assessment/Plan    PT Assessment Patient needs continued PT services  PT Diagnosis Difficulty walking   PT Problem List Decreased strength;Decreased range of motion;Decreased activity tolerance;Decreased mobility  PT Treatment Interventions DME instruction;Gait training;Stair training;Functional mobility training;Therapeutic activities;Therapeutic exercise;Patient/family education   PT Goals (Current goals can be found in the Care Plan section) Acute Rehab PT Goals Patient Stated Goal: to be able to walk again without RW  PT Goal Formulation: With patient Time For Goal Achievement:  05/24/14 Potential to Achieve Goals: Good    Frequency 7X/week   Barriers to discharge        Co-evaluation               End of Session Equipment Utilized During Treatment: Gait belt Activity Tolerance: Patient tolerated treatment well Patient left: in chair;with family/visitor present Nurse Communication: Mobility status         Time: 4920-1007 PT Time Calculation (min) (ACUTE ONLY): 40 min   Charges:   PT Evaluation $Initial PT Evaluation Tier I: 1 Procedure PT Treatments $Gait Training: 8-22 mins $Therapeutic Exercise: 8-22 mins   PT G CodesClide Dales June 03, 2014, 5:28 PM  Clide Dales, PT Pager: [phone] 06-03-2014

## 2014-05-10 NOTE — Op Note (Signed)
NAME:  Duane Burns                ACCOUNT NO.: [id number]      MEDICAL RECORD NO.: [id number]      FACILITY:  [facility]      PHYSICIAN:  Paralee Cancel D  DATE OF BIRTH:  12/26/1935     DATE OF PROCEDURE:  05/10/2014                                 OPERATIVE REPORT         PREOPERATIVE DIAGNOSIS: Right  hip osteoarthritis.      POSTOPERATIVE DIAGNOSIS:  Right hip osteoarthritis.      PROCEDURE:  Right total hip replacement through an anterior approach   utilizing DePuy THR system, component size 29mm pinnacle cup, a size 36+4 neutral   Altrex liner, a size 8 hi Tri Lock stem with a 36+1.5 delta ceramic   ball.      SURGEON:  Pietro Cassis. Alvan Dame, M.D.      ASSISTANT:  Danae Orleans, PA-C     ANESTHESIA:  Spinal.      SPECIMENS:  None.      COMPLICATIONS:  None.      BLOOD LOSS:  200 cc     DRAINS:  None.      INDICATION OF THE PROCEDURE:  Duane Burns is a 79 y.o. male who had   presented to office for evaluation of right hip pain.  Radiographs revealed   progressive degenerative changes with bone-on-bone   articulation to the  hip joint.  The patient had painful limited range of   motion significantly affecting their overall quality of life.  The patient was failing to    respond to conservative measures, and at this point was ready   to proceed with more definitive measures.  The patient has noted progressive   degenerative changes in his hip, progressive problems and dysfunction   with regarding the hip prior to surgery.  Consent was obtained for   benefit of pain relief.  Specific risk of infection, DVT, component   failure, dislocation, need for revision surgery, as well discussion of   the anterior versus posterior approach were reviewed.  Consent was   obtained for benefit of anterior pain relief through an anterior   approach.      PROCEDURE IN DETAIL:  The patient was brought to operative theater.   Once adequate anesthesia,  preoperative antibiotics, 2gm Ancef, 80 mg of Gentamycin (due to catheter insertion - Urology consult), 1gm of Tranexamic Acid, and 10mg  of Decadron administered.   The patient was positioned supine on the OSI Hanna table.  Once adequate   padding of boney process was carried out, we had predraped out the hip, and  used fluoroscopy to confirm orientation of the pelvis and position.      The right hip was then prepped and draped from proximal iliac crest to   mid thigh with shower curtain technique.      Time-out was performed identifying the patient, planned procedure, and   extremity.     An incision was then made 2 cm distal and lateral to the   anterior superior iliac spine extending over the orientation of the   tensor fascia lata muscle and sharp dissection was carried down to the   fascia of the muscle and protractor placed in the soft tissues.  The fascia was then incised.  The muscle belly was identified and swept   laterally and retractor placed along the superior neck.  Following   cauterization of the circumflex vessels and removing some pericapsular   fat, a second cobra retractor was placed on the inferior neck.  A third   retractor was placed on the anterior acetabulum after elevating the   anterior rectus.  A L-capsulotomy was along the line of the   superior neck to the trochanteric fossa, then extended proximally and   distally.  Tag sutures were placed and the retractors were then placed   intracapsular.  We then identified the trochanteric fossa and   orientation of my neck cut, confirmed this radiographically   and then made a neck osteotomy with the femur on traction.  The femoral   head was removed without difficulty or complication.  Traction was let   off and retractors were placed posterior and anterior around the   acetabulum.      The labrum and foveal tissue were debrided.  I began reaming with a 45mm   reamer and reamed up to 58mm reamer with good bony  bed preparation and a 42mm   cup was chosen.  The final 4mm Pinnacle cup was then impacted under fluoroscopy  to confirm the depth of penetration and orientation with respect to   abduction.  A screw was placed in the ileum.  The final   36+4 neutral Altrex liner was impacted with good visualized rim fit.  The cup was positioned anatomically within the acetabular portion of the pelvis.      At this point, the femur was rolled at 80 degrees.  Further capsule was   released off the inferior aspect of the femoral neck.  I then   released the superior capsule proximally.  The hook was placed laterally   along the femur and elevated manually and held in position with the bed   hook.  The leg was then extended and adducted with the leg rolled to 100   degrees of external rotation.  Once the proximal femur was fully   exposed, I used a box osteotome to set orientation.  I then began   broaching with the starting chili pepper broach and passed this by hand and then broached up to 8.  With the 8 broach in place I chose a high offset neck and did a trial reduction.  The offset was appropriate, leg lengths   appeared to be equal, confirmed radiographically.   Given these findings, I went ahead and dislocated the hip, repositioned all   retractors and positioned the right hip in the extended and abducted position.  The final 8 hi Tri Lock stem was   chosen and it was impacted down to the level of neck cut.  Based on this   and the trial reduction, a 36+1.5 delta ceramic ball was chosen and   impacted onto a clean and dry trunnion, and the hip was reduced.  The   hip had been irrigated throughout the case again at this point.  I did   reapproximate the superior capsular leaflet to the anterior leaflet   using #1 Vicryl.  The fascia of the   tensor fascia lata muscle was then reapproximated using #1 Vicryl.  The   remaining wound was closed with 2-0 Vicryl and running 4-0 Monocryl.   The hip was cleaned,  dried, and dressed sterilely using Dermabond and   Aquacel dressing.  He was then brought   to recovery room in stable condition tolerating the procedure well.    Danae Orleans, PA-C was present for the entirety of the case involved from   preoperative positioning, perioperative retractor management, general   facilitation of the case, as well as primary wound closure as assistant.            Pietro Cassis Alvan Dame, M.D.        05/10/2014 8:55 AM

## 2014-05-10 NOTE — Consult Note (Signed)
Reason for Consult:Urethral Stricture, Difficult FOley Referring Physician: Adriana Mccallum MD  Duane Burns is an 79 y.o. male.   HPI:   1 - Urethral Stricture, Difficult Foley - 79yo M undergoing Rt hip arthroplasty noted to have difficult foley with resistance at penile urethra. Need catheter fur acurate peri-op UOP monitoring. Has h/o prostatectomy for prostate cancer. Denies obstrution at baseline.   Today Duane Burns is seen as urgent intra-operative consultation for above. He is referred by Dr. Alvan Dame.   Past Medical History  Diagnosis Date  . Hyperlipidemia   . Hypertension   . Foot drop, left     DUE TO NERVE INJURY IN BACK PER PT  . Pneumonia     JAN 2016  . Arthritis   . HNP (herniated nucleus pulposus with myelopathy), thoracic     HISTORY OF HNP  . Cancer     History of skin cancer / hx Prostate cancer  . Urgency of urination   . Urinary leakage     Past Surgical History  Procedure Laterality Date  . Prostate surgery    . Back surgery      X 3  . Skin cancer excision    . Tonsillectomy      Family History  Problem Relation Age of Onset  . Diabetes Daughter   . Hypertension Daughter     Social History:  reports that he quit smoking about 20 years ago. He has never used smokeless tobacco. He reports that he does not drink alcohol or use illicit drugs.  Allergies: No Known Allergies  Medications: I have reviewed the patient's current medications.  No results found for this or any previous visit (from the past 48 hour(s)).  No results found.  Review of Systems  Constitutional: Negative.   HENT: Negative.   Eyes: Negative.   Respiratory: Negative.   Cardiovascular: Negative.   Gastrointestinal: Negative.   Genitourinary: Negative.   Musculoskeletal: Negative.   Skin: Negative.   Neurological: Negative.   Endo/Heme/Allergies: Negative.   Psychiatric/Behavioral: Negative.    Blood pressure 145/75, pulse 85, temperature 98.9 F (37.2 C), temperature  source Oral, resp. rate 18, height 5\' 8"  (1.727 m), weight 90.266 kg (199 lb), SpO2 98 %. Physical Exam  Constitutional: He appears well-developed.  In OR 3 under spinal anasthetic  HENT:  Head: Normocephalic.  Eyes: Pupils are equal, round, and reactive to light.  Cardiovascular: Normal rate.   Respiratory: Effort normal.  GI: Soft.  Genitourinary: Penis normal.  Uncirucumcised.   Musculoskeletal: Normal range of motion.  Neurological: He is alert.  Skin: Skin is warm.  Psychiatric: He has a normal mood and affect. His behavior is normal. Judgment and thought content normal.    Assessment/Plan:  1 - Urethral Stricture, Difficult Foley - 56F council foley placed at time or OR cysto and dilation as per separate op note. Current catheter to remain overnight, then DC in AM as stricture segment short and without mucosal disruption on post-dilaiton cysto. Pt's wife and primary team updated on plan.   We will arrange trial of void in AM and outpatient follow up.   Call with questions.   Navaya Wiatrek 05/10/2014, 8:00 AM

## 2014-05-10 NOTE — Plan of Care (Signed)
Problem: Consults Goal: Diagnosis- Total Joint Replacement Right anterior hip     

## 2014-05-10 NOTE — Anesthesia Procedure Notes (Signed)
Spinal Patient location during procedure: OR Start time: 05/10/2014 7:16 AM End time: 05/10/2014 7:23 AM Staffing Anesthesiologist: Duane Boston Performed by: anesthesiologist  Preanesthetic Checklist Completed: patient identified, surgical consent, pre-op evaluation, timeout performed, IV checked, risks and benefits discussed and monitors and equipment checked Spinal Block Patient position: sitting Prep: Betadine Patient monitoring: cardiac monitor, continuous pulse ox and blood pressure Approach: left paramedian Location: L2-3 Injection technique: single-shot Needle Needle type: Pencan  Needle gauge: 24 G Needle length: 9 cm Additional Notes Functioning IV was confirmed and monitors were applied. Sterile prep and drape, including hand hygiene and sterile gloves were used. The patient was positioned and the spine was prepped. The skin was anesthetized with lidocaine.  Free flow of clear CSF was obtained prior to injecting local anesthetic into the CSF.  The spinal needle aspirated freely following injection.  The needle was carefully withdrawn.  The patient tolerated the procedure well.

## 2014-05-11 DIAGNOSIS — E669 Obesity, unspecified: Secondary | ICD-10-CM | POA: Diagnosis present

## 2014-05-11 LAB — BASIC METABOLIC PANEL
ANION GAP: 11 (ref 5–15)
BUN: 20 mg/dL (ref 6–23)
CALCIUM: 8.5 mg/dL (ref 8.4–10.5)
CHLORIDE: 107 mmol/L (ref 96–112)
CO2: 23 mmol/L (ref 19–32)
Creatinine, Ser: 0.91 mg/dL (ref 0.50–1.35)
GFR calc Af Amer: >90 mL/min (ref >90)
GFR calc non Af Amer: 80 mL/min — ABNORMAL LOW (ref >90)
GLUCOSE: 140 mg/dL — AB (ref 70–99)
POTASSIUM: 4.7 mmol/L (ref 3.5–5.1)
SODIUM: 141 mmol/L (ref 135–145)

## 2014-05-11 LAB — CBC
HEMATOCRIT: 34.7 % — AB (ref 39.0–52.0)
Hemoglobin: 11.1 g/dL — ABNORMAL LOW (ref 13.0–17.0)
MCH: 28.2 pg (ref 26.0–34.0)
MCHC: 32 g/dL (ref 30.0–36.0)
MCV: 88.3 fL (ref 78.0–100.0)
Platelets: 221 10*3/uL (ref 150–400)
RBC: 3.93 MIL/uL — ABNORMAL LOW (ref 4.22–5.81)
RDW: 14.2 % (ref 11.5–15.5)
WBC: 20.4 10*3/uL — AB (ref 4.0–10.5)

## 2014-05-11 MED ORDER — ASPIRIN 325 MG PO TBEC: 325.0000 mg | DELAYED_RELEASE_TABLET | Freq: Two times a day (BID) | ORAL | Status: AC

## 2014-05-11 MED ORDER — TIZANIDINE HCL 4 MG PO TABS: 4.0000 mg | ORAL_TABLET | Freq: Four times a day (QID) | ORAL | Status: DC | PRN

## 2014-05-11 MED ORDER — DOCUSATE SODIUM 100 MG PO CAPS: 100.0000 mg | ORAL_CAPSULE | Freq: Two times a day (BID) | ORAL | Status: DC

## 2014-05-11 MED ORDER — HYDROCODONE-ACETAMINOPHEN 7.5-325 MG PO TABS: 1.0000 | ORAL_TABLET | ORAL | Status: DC | PRN

## 2014-05-11 MED ORDER — POLYETHYLENE GLYCOL 3350 17 G PO PACK: 17.0000 g | PACK | Freq: Two times a day (BID) | ORAL | Status: DC

## 2014-05-11 MED ORDER — FERROUS SULFATE 325 (65 FE) MG PO TABS: 325.0000 mg | ORAL_TABLET | Freq: Three times a day (TID) | ORAL | Status: DC

## 2014-05-11 NOTE — Progress Notes (Addendum)
Physical Therapy Treatment Patient Details Name: Duane Burns MRN: [id number] DOB: 1935/06/11 Today's Date: 05/11/2014    History of Present Illness s/p R THA direct anterior. History of back surgery and L foot drop for which he wears a brace (AFO) .     PT Comments    POD # 1 am session.  Spouse present for education.  Assisted with amb in hallway.  Educated on safe mobility and handling.  Practiced going up and down 4 steps using one rail and one crutch.  Performed with spouse under therapist direction.  Returned to room and applied ICE.   Follow Up Recommendations  Home health PT     Equipment Recommendations       Recommendations for Other Services       Precautions / Restrictions Precautions Precautions:   wear AFO/shoe on R LE Restrictions Weight Bearing Restrictions: No Other Position/Activity Restrictions: WBAT    Mobility  Bed Mobility               General bed mobility comments: Pt OOB in recliner  Transfers Overall transfer level: Needs assistance Equipment used: Rolling walker (2 wheeled) Transfers: Sit to/from Stand Sit to Stand: Supervision;Min guard         General transfer comment: 25% VC's on proper hand placement and 50% VC's safety with turns.  Ambulation/Gait Ambulation/Gait assistance: Min guard;Min assist Ambulation Distance (Feet): 85 Feet Assistive device: Rolling walker (2 wheeled) Gait Pattern/deviations: Step-to pattern Gait velocity: too fast.  VC's to decrease gait speed to increase safety.   General Gait Details: pt tends to push RW too far to front.  VC's to increase upright posture.  Spouse states "he has walked llike that for years esp after his many back surgeries."  A bit impulsive.  VC's for mobility safety.   Stairs Stairs: Yes Stairs assistance: Min assist Stair Management: One rail Right;Step to pattern;Forwards;With crutches Number of Stairs: 4 General stair comments: with spouse present for family education.   Assisted up and down 4 steps using one rail and one crutch.  50% VC's on proper tech and sequencing.    Wheelchair Mobility    Modified Rankin (Stroke Patients Only)       Balance                                    Cognition Arousal/Alertness: Awake/alert Behavior During Therapy: WFL for tasks assessed/performed Overall Cognitive Status: Within Functional Limits for tasks assessed                      Exercises      General Comments        Pertinent Vitals/Pain Pain Assessment: 0-10 Pain Score: 2  Pain Location: R anterior hip/upper thigh Pain Descriptors / Indicators: Sore;Tender Pain Intervention(s): Monitored during session;Premedicated before session;Repositioned;Ice applied    Home Living Family/patient expects to be discharged to:: Private residence Living Arrangements: Spouse/significant other Available Help at Discharge: Family Type of Home: House Home Access: Stairs to enter Entrance Stairs-Rails: Can reach both Home Layout: One level;Able to live on main level with bedroom/bathroom Home Equipment: Gilford Rile - 2 wheels;Other (comment) (L AFO foot brace)      Prior Function Level of Independence: Independent with assistive device(s)      Comments: has been using the RW for last few months   PT Goals (current goals can now be found in the care  plan section) Acute Rehab PT Goals Patient Stated Goal: to be able to walk again without RW  Progress towards PT goals: Progressing toward goals    Frequency  7X/week    PT Plan      Co-evaluation             End of Session Equipment Utilized During Treatment: Gait belt Activity Tolerance: Patient tolerated treatment well Patient left: in chair;with family/visitor present     Time: 3545-6256 PT Time Calculation (min) (ACUTE ONLY): 29 min  Charges:  $Gait Training: 8-22 mins $Therapeutic Activity: 8-22 mins                    G Codes:      Rica Koyanagi  PTA [facility]  Acute   Rehab Pager      [phone]

## 2014-05-11 NOTE — Progress Notes (Signed)
1 Day Post-Op  Subjective:  1 - Urethral Stricture, Difficult Foley -  S/p cysto and urethral dilation for short segment pendulous stricture at time of hip replacement 3/15. Foley removed 3/16. Has h/o prostatectomy for prostate cancer. Denies obstrution at baseline.   2 - Prostate Cancer - s/p prostatecotmy 2009, follows annually with Dr. Alinda Money, most recent PSA remains undetectable.  Today abdi is seen in f/u above. Foley removed this AM w/o issue, now on voiding trial.   Objective: Vital signs in last 24 hours: Temp:  [97.5 F (36.4 C)-98.2 F (36.8 C)] 97.5 F (36.4 C) (03/16 0641) Pulse Rate:  [60-79] 61 (03/16 0641) Resp:  [16-23] 16 (03/16 0641) BP: (105-161)/(53-80) 117/64 mmHg (03/16 0641) SpO2:  [96 %-100 %] 97 % (03/16 0641) Weight:  [90.266 kg (199 lb)] 90.266 kg (199 lb) (03/15 1030) Last BM Date: 05/10/14  Intake/Output from previous day: 03/15 0701 - 03/16 0700 In: 5521.7 [P.O.:1440; I.V.:3981.7; IV Piggyback:100] Out: 2335 [Urine:2035; Blood:300] Intake/Output this shift:    General appearance: alert, cooperative, appears stated age and wife at bedside Nose: Nares normal. Septum midline. Mucosa normal. No drainage or sinus tenderness. Throat: lips, mucosa, and tongue normal; teeth and gums normal Neck: supple, symmetrical, trachea midline Back: symmetric, no curvature. ROM normal. No CVA tenderness. Resp: non-labored on room air Male genitalia: normal Pulses: 2+ and symmetric Skin: Skin color, texture, turgor normal. No rashes or lesions Lymph nodes: Cervical, supraclavicular, and axillary nodes normal. Neurologic: Grossly normal  Lab Results:   Recent Labs  05/11/14 0525  WBC 20.4*  HGB 11.1*  HCT 34.7*  PLT 221   BMET  Recent Labs  05/11/14 0525  NA 141  K 4.7  CL 107  CO2 23  GLUCOSE 140*  BUN 20  CREATININE 0.91  CALCIUM 8.5   PT/INR No results for input(s): LABPROT, INR in the last 72 hours. ABG No results for input(s): PHART,  HCO3 in the last 72 hours.  Invalid input(s): PCO2, PO2  Studies/Results: Dg C-arm 1-60 Min-no Report  05/10/2014   CLINICAL DATA: surgery   C-ARM 1-60 MINUTES  Fluoroscopy was utilized by the requesting physician.  No radiographic  interpretation.    Dg Hip Port Unilat With Pelvis 1v Right  05/10/2014   CLINICAL DATA:  Right hip replacement.  EXAM: RIGHT HIP (WITH PELVIS) 1 VIEW PORTABLE; DG C-ARM 1-60 MIN - NRPT [facility]  COMPARISON:  12/27/2013.  FINDINGS: Interval right total hip prosthesis in satisfactory position and alignment. No fracture or dislocation seen. A Foley catheter is in place. Moderate left hip degenerative changes.  IMPRESSION: Satisfactory postoperative appearance of a right total hip prosthesis.   Electronically Signed   By: Claudie Revering M.D.   On: 05/10/2014 11:22    Anti-infectives: Anti-infectives    Start     Dose/Rate Route Frequency Ordered Stop   05/10/14 1200  ceFAZolin (ANCEF) IVPB 2 g/50 mL premix     2 g 100 mL/hr over 30 Minutes Intravenous Every 6 hours 05/10/14 1044 05/10/14 1839   05/10/14 0800  gentamicin (GARAMYCIN) IVPB 80 mg     80 mg 100 mL/hr over 30 Minutes Intravenous  Once 05/10/14 0755     05/10/14 0748  gentamicin (GARAMYCIN) 390 mg in dextrose 5 % 100 mL IVPB  Status:  Discontinued     5 mg/kg  77.2 kg (Adjusted) 109.8 mL/hr over 60 Minutes Intravenous 60 min pre-op 05/10/14 0750 05/10/14 0754   05/10/14 0745  gentamicin (GARAMYCIN) IVPB 80 mg  Status:  Discontinued     80 mg 100 mL/hr over 30 Minutes Intravenous  Once 05/10/14 0736 05/10/14 0750   05/10/14 0745  gentamicin (GARAMYCIN) IVPB 80 mg  Status:  Discontinued     80 mg 100 mL/hr over 30 Minutes Intravenous  Once 05/10/14 0739 05/10/14 0750   05/10/14 0505  ceFAZolin (ANCEF) IVPB 2 g/50 mL premix     2 g 100 mL/hr over 30 Minutes Intravenous On call to O.R. 05/10/14 0505 05/10/14 0723      Assessment/Plan:  1 - Urethral Stricture, Difficult Foley -  Foley to remain out. Pt  OK for DC from GU perspective at anytime. I do not feel any additional GU-specific medicaiton or treatment warranted.   2 - Prostate Cancer - continue annual GU f/u  3 - Pt will f/u as previously scheduled with [place], call with questions.   [facility], Jovanny Stephanie 05/11/2014

## 2014-05-11 NOTE — Progress Notes (Signed)
     Subjective: 1 Day Post-Op Procedure(s) (LRB): RIGHT TOTAL HIP ARTHROPLASTY ANTERIOR APPROACH  (Right) CYSTOSCOPY WITH URETHRAL DILATATION (N/A)   Patient reports pain as mild, pain controlled. No events throughout the night. Feels that are doing really well and looking forward to working with PT. Ready to be discharged home after PT.  Objective:   VITALS:   Filed Vitals:   05/11/14 0641  BP: 117/64  Pulse: 61  Temp: 97.5 F (36.4 C)  Resp: 16    Dorsiflexion/Plantar flexion intact Incision: dressing C/D/I No cellulitis present Compartment soft  LABS  Recent Labs  05/11/14 0525  HGB 11.1*  HCT 34.7*  WBC 20.4*  PLT 221     Recent Labs  05/11/14 0525  NA 141  K 4.7  BUN 20  CREATININE 0.91  GLUCOSE 140*     Assessment/Plan: 1 Day Post-Op Procedure(s) (LRB): RIGHT TOTAL HIP ARTHROPLASTY ANTERIOR APPROACH  (Right) CYSTOSCOPY WITH URETHRAL DILATATION (N/A) Foley cath d/c'ed Advance diet Up with therapy D/C IV fluids Discharge home with home health  Follow up in 2 weeks at [facility]. Follow up with OLIN,Brent Taillon D in 2 weeks.  Contact information:  [facility] [address], [place] [phone]    Obese (BMI 30-39.9) Estimated body mass index is 30.26 kg/(m^2) as calculated from the following:   Height as of this encounter: 5\' 8"  (1.727 m).   Weight as of this encounter: 90.266 kg (199 lb). Patient also counseled that weight may inhibit the healing process Patient counseled that losing weight will help with future health issues        [place] Pugh. Thanos Cousineau   PAC  05/11/2014, 10:36 AM

## 2014-05-11 NOTE — Progress Notes (Signed)
Physical Therapy Treatment Patient Details Name: Duane Burns MRN: [id number] DOB: 1935/06/23 Today's Date: 05/11/2014    History of Present Illness s/p R THA direct anterior. History of back surgery and L foot drop for which he wears a brace (AFO) .     PT Comments    POD # 2 pm session.  Assisted with amb a great distance in hallway then returned to room to perform THR TE's following handout HEP.  Spouse present and also instructed on proper tech and freq.  Pt performed stairs this am.  Pt ready for D/C to home.    Follow Up Recommendations  Home health PT     Equipment Recommendations       Recommendations for Other Services       Precautions / Restrictions Precautions Precautions: None Restrictions Weight Bearing Restrictions: No Other Position/Activity Restrictions: WBAT    Mobility  Bed Mobility Overal bed mobility: Needs Assistance Bed Mobility: Sit to Supine       Sit to supine: Min guard   General bed mobility comments: assist back to bed for TE's  Transfers Overall transfer level: Needs assistance Equipment used: Rolling walker (2 wheeled) Transfers: Sit to/from Stand Sit to Stand: Supervision;Min guard         General transfer comment: 25% VC's on proper hand placement and 50% VC's safety with turns.  Ambulation/Gait Ambulation/Gait assistance: Min guard;Min assist Ambulation Distance (Feet): 90 Feet Assistive device: Rolling walker (2 wheeled) Gait Pattern/deviations: Step-to pattern Gait velocity: too fast.  VC's to decrease gait speed to increase safety.   General Gait Details: pt tends to push RW too far to front.  VC's to increase upright posture.  Spouse states "he has walked llike that for years esp after his many back surgeries."  A bit impulsive.  VC's for mobility safety.   Stairs Stairs: Yes Stairs assistance: Min assist Stair Management: One rail Right;Step to pattern;Forwards;With crutches Number of Stairs: 4 General stair  comments: with spouse present for family education.  Assisted up and down 4 steps using one rail and one crutch.  50% VC's on proper tech and sequencing.    Wheelchair Mobility    Modified Rankin (Stroke Patients Only)       Balance                                    Cognition Arousal/Alertness: Awake/alert Behavior During Therapy: WFL for tasks assessed/performed Overall Cognitive Status: Within Functional Limits for tasks assessed                      Exercises   Total Hip Replacement TE's 10 reps ankle pumps 10 reps knee presses 10 reps heel slides 10 reps SAQ's 10 reps ABD Followed by ICE     General Comments        Pertinent Vitals/Pain Pain Assessment: 0-10 Pain Score: 2  Pain Location: R anterior hip/upper thigh Pain Descriptors / Indicators: Sore;Tender Pain Intervention(s): Monitored during session;Premedicated before session;Repositioned;Ice applied    Home Living Family/patient expects to be discharged to:: Private residence Living Arrangements: Spouse/significant other Available Help at Discharge: Family Type of Home: House Home Access: Stairs to enter Entrance Stairs-Rails: Can reach both Home Layout: One level;Able to live on main level with bedroom/bathroom Home Equipment: Gilford Rile - 2 wheels;Other (comment) (L AFO foot brace)      Prior Function Level of Independence: Independent with  assistive device(s)      Comments: has been using the RW for last few months   PT Goals (current goals can now be found in the care plan section) Acute Rehab PT Goals Patient Stated Goal: to be able to walk again without RW  Progress towards PT goals: Progressing toward goals    Frequency  7X/week    PT Plan      Co-evaluation             End of Session Equipment Utilized During Treatment: Gait belt Activity Tolerance: Patient tolerated treatment well Patient left: in chair;with family/visitor present     Time:  1300-1325 PT Time Calculation (min) (ACUTE ONLY): 25 min  Charges:  $Gait Training: 8-22 mins $Therapeutic Exercise: 8-22 mins                    G Codes:      Rica Koyanagi  PTA [facility]  Acute  Rehab Pager      [phone]

## 2014-05-11 NOTE — Evaluation (Signed)
Occupational Therapy Evaluation Patient Details Name: Duane Burns MRN: [id number] DOB: [birth year] Today's Date: 05/11/2014    History of Present Illness s/p R THA direct anterior. History of back surgery and L foot drop for which he wears a brace (AFO) .    Clinical Impression   OT education complete    Follow Up Recommendations  No OT follow up    Equipment Recommendations  None recommended by OT       Precautions / Restrictions Restrictions Weight Bearing Restrictions: No      Mobility Bed Mobility               General bed mobility comments: pt in chair  Transfers Overall transfer level: Needs assistance Equipment used: Rolling walker (2 wheeled) Transfers: Sit to/from Stand Sit to Stand: Supervision                   ADL Overall ADL's : Needs assistance/impaired                     Lower Body Dressing: Min guard;Sit to/from stand Lower Body Dressing Details (indicate cue type and reason): educated using sock aid. pt may obtain Toilet Transfer: Set up;Ambulation;Comfort height toilet;RW   Toileting- Clothing Manipulation and Hygiene: Supervision/safety;Sit to/from stand       Functional mobility during ADLs: Herbalist     Praxis      Pertinent Vitals/Pain Pain Score: 4  Pain Location: r hip area Pain Descriptors / Indicators: Sore Pain Intervention(s): Monitored during session;Repositioned     Hand Dominance     Extremity/Trunk Assessment Upper Extremity Assessment Upper Extremity Assessment: Overall WFL for tasks assessed           Communication Communication Communication: No difficulties   Cognition Arousal/Alertness: Awake/alert Behavior During Therapy: WFL for tasks assessed/performed Overall Cognitive Status: Within Functional Limits for tasks assessed                                Home Living Family/patient expects to be discharged to::  Private residence Living Arrangements: Spouse/significant other Available Help at Discharge: Family Type of Home: House Home Access: Stairs to enter [employer] of Steps: 5-6 Entrance Stairs-Rails: Can reach both Home Layout: One level;Able to live on main level with bedroom/bathroom     Bathroom Shower/Tub: Tub/shower unit   Bathroom Toilet: Handicapped height     Home Equipment: Environmental consultant - 2 wheels;Other (comment) (L AFO foot brace)          Prior Functioning/Environment Level of Independence: Independent with assistive device(s)        Comments: has been using the RW for last few months             OT Goals(Current goals can be found in the care plan section) Acute Rehab OT Goals Patient Stated Goal: to be able to walk again without RW  OT Goal Formulation: With patient  OT Frequency:                End of Session Equipment Utilized During Treatment: Rolling walker Nurse Communication: Mobility status  Activity Tolerance: Patient tolerated treatment well Patient left: in chair;with call bell/phone within reach;with family/visitor present   Time: 1140-1200 OT Time Calculation (min): 20 min Charges:  OT General Charges $OT Visit: 1 Procedure OT Evaluation $Initial OT Evaluation Tier I:  1 Procedure G-Codes:    Betsy Pries 05/29/14, 12:07 PM

## 2014-05-11 NOTE — Care Management Note (Signed)
    Page 1 of 1   05/11/2014     1:31:07 PM CARE MANAGEMENT NOTE 05/11/2014  Patient:  Duane Burns, Duane Burns   Account Number:  [id number]  Date Initiated:  05/11/2014  Documentation initiated by:  [facility]  Subjective/Objective Assessment:   adm: RIGHT TOTAL HIP ARTHROPLASTY ANTERIOR APPROACH  (Right)     Action/Plan:   discharge planning   Anticipated DC Date:  05/11/2014   Anticipated DC Plan:  [place]  CM consult      [facility] Choice  HOME HEALTH   Choice offered to / List presented to:  C-1 Patient        [place] arranged  HH-2 PT      [place]   Status of service:  Completed, signed off Medicare Important Message given?   (If response is "NO", the following Medicare IM given date fields will be blank) Date Medicare IM given:   Medicare IM given by:   Date Additional Medicare IM given:   Additional Medicare IM given by:    Discharge Disposition:  [place]  Per UR Regulation:    If discussed at Long Length of Stay Meetings, dates discussed:    Comments:  05/11/14 12:00 CM met with pt in room to offer choice of home health agency. Pt chooses [facility] to render HHPT.  Pt has both rolling walker and 3n1 at home.  Address and contact information verified by pt.  Referral emailed to [employer], Tim.  No other CM needs were communicated. Mariane Masters, BSN, CM [phone].

## 2014-05-16 NOTE — Discharge Summary (Signed)
Physician Discharge Summary  Patient ID: Duane Burns MRN: [id number] DOB/AGE: 1935/10/18 79 y.o.  Admit date: 05/10/2014 Discharge date: 05/11/2014   Procedures:  Procedure(s) (LRB): RIGHT TOTAL HIP ARTHROPLASTY ANTERIOR APPROACH  (Right) CYSTOSCOPY WITH URETHRAL DILATATION (N/A)  Attending Physician:  Dr. Paralee Cancel   Admission Diagnoses:   Right hip primary OA / pain  Discharge Diagnoses:  Principal Problem:   S/P right TKA Active Problems:   S/P hip replacement   Obese  Past Medical History  Diagnosis Date  . Hyperlipidemia   . Hypertension   . Foot drop, left     DUE TO NERVE INJURY IN BACK PER PT  . Pneumonia     JAN 2016  . Arthritis   . HNP (herniated nucleus pulposus with myelopathy), thoracic     HISTORY OF HNP  . Cancer     History of skin cancer / hx Prostate cancer  . Urgency of urination   . Urinary leakage     HPI:    Duane Burns, 79 y.o. male, has a history of pain and functional disability in the right hip(s) due to arthritis and patient has failed non-surgical conservative treatments for greater than 12 weeks to include NSAID's and/or analgesics, use of assistive devices and activity modification. Onset of symptoms was gradual starting ~6 months ago with gradually worsening course since that time.The patient noted no past surgery on the right hip(s). Patient currently rates pain in the right hip at 10 out of 10 with activity. Patient has worsening of pain with activity and weight bearing, trendelenberg gait, pain that interfers with activities of daily living and pain with passive range of motion. Patient has evidence of periarticular osteophytes and joint space narrowing by imaging studies. This condition presents safety issues increasing the risk of falls. There is no current active infection. Risks, benefits and expectations were discussed with the patient. Risks including but not limited to the risk of anesthesia, blood clots, nerve  damage, blood vessel damage, failure of the prosthesis, infection and up to and including death. Patient understand the risks, benefits and expectations and wishes to proceed with surgery.   PCP: [name], MD   Discharged Condition: good  Hospital Course:  Patient underwent the above stated procedure on 05/10/2014. Patient tolerated the procedure well and brought to the recovery room in good condition and subsequently to the floor.  POD #1 BP: 117/64 ; Pulse: 61 ; Temp: 97.5 F (36.4 C) ; Resp: 16 Patient reports pain as mild, pain controlled. No events throughout the night. Feels that are doing really well and looking forward to working with PT. Ready to be discharged home after PT. Dorsiflexion/plantar flexion intact, incision: dressing C/D/I, no cellulitis present and compartment soft.   LABS  Basename    HGB  11.1  HCT  34.7    Discharge Exam: General appearance: alert, cooperative and no distress Extremities: Homans sign is negative, no sign of DVT, no edema, redness or tenderness in the calves or thighs and no ulcers, gangrene or trophic changes  Disposition: Home with follow up in 2 weeks   Follow-up Information    Follow up with Mauri Pole, MD. Schedule an appointment as soon as possible for a visit in 2 weeks.   Specialty:  Orthopedic Surgery   Contact information:   [address] [place] [address] [phone]       Follow up with [facility].   Why:  home health physical therapy  Contact information:   [address] [place] [place] [phone]       Discharge Instructions    Call MD / Call 911    Complete by:  As directed   If you experience chest pain or shortness of breath, CALL 911 and be transported to the hospital emergency room.  If you develope a fever above 101 F, pus (white drainage) or increased drainage or redness at the wound, or calf pain, call your surgeon's office.     Change  dressing    Complete by:  As directed   Maintain surgical dressing until follow up in the clinic. If the edges start to pull up, may reinforce with tape. If the dressing is no longer working, may remove and cover with gauze and tape, but must keep the area dry and clean.  Call with any questions or concerns.     Constipation Prevention    Complete by:  As directed   Drink plenty of fluids.  Prune juice may be helpful.  You may use a stool softener, such as Colace (over the counter) 100 mg twice a day.  Use MiraLax (over the counter) for constipation as needed.     Diet - low sodium heart healthy    Complete by:  As directed      Discharge instructions    Complete by:  As directed   Maintain surgical dressing until follow up in the clinic. If the edges start to pull up, may reinforce with tape. If the dressing is no longer working, may remove and cover with gauze and tape, but must keep the area dry and clean.  Follow up in 2 weeks at [facility]. Call with any questions or concerns.     Increase activity slowly as tolerated    Complete by:  As directed      TED hose    Complete by:  As directed   Use stockings (TED hose) for 2 weeks on both leg(s).  You may remove them at night for sleeping.     Weight bearing as tolerated    Complete by:  As directed   Laterality:  right  Extremity:  Lower             Medication List    STOP taking these medications        Diclofenac Sodium CR 100 MG 24 hr tablet     HYDROcodone-acetaminophen 5-325 MG per tablet  Commonly known as:  NORCO/VICODIN  Replaced by:  HYDROcodone-acetaminophen 7.5-325 MG per tablet      TAKE these medications        aspirin 325 MG EC tablet  Take 1 tablet (325 mg total) by mouth 2 (two) times daily.     cetirizine 10 MG tablet  Commonly known as:  ZYRTEC  Take 10 mg by mouth at bedtime.     cholecalciferol 1000 UNITS tablet  Commonly known as:  VITAMIN D  Take 1,000 Units by mouth daily.      Cinnamon 500 MG Tabs  Take 500 mg by mouth daily.     docusate sodium 100 MG capsule  Commonly known as:  COLACE  Take 1 capsule (100 mg total) by mouth 2 (two) times daily.     ferrous sulfate 325 (65 FE) MG tablet  Take 1 tablet (325 mg total) by mouth 3 (three) times daily after meals.     Fish Oil 1200 MG Caps  Take 1,200-2,400 mg by mouth  2 (two) times daily. Take 2 capsules in the morning and 1 capsule in the evening     HYDROcodone-acetaminophen 7.5-325 MG per tablet  Commonly known as:  NORCO  Take 1-2 tablets by mouth every 4 (four) hours as needed for moderate pain.     lisinopril 10 MG tablet  Commonly known as:  PRINIVIL,ZESTRIL  Take 10 mg by mouth at bedtime.     multivitamin with minerals Tabs tablet  Take 1 tablet by mouth daily.     polyethylene glycol packet  Commonly known as:  MIRALAX / GLYCOLAX  Take 17 g by mouth 2 (two) times daily.     pravastatin 80 MG tablet  Commonly known as:  PRAVACHOL  Take 80 mg by mouth daily.     tiZANidine 4 MG tablet  Commonly known as:  ZANAFLEX  Take 1 tablet (4 mg total) by mouth every 6 (six) hours as needed for muscle spasms.     TUMS ULTRA 1000 400 MG tablet  Generic drug:  calcium elemental as carbonate  Chew 1,000 mg by mouth daily.         Signed: [place]. Shiela Bruns   PA-C  05/16/2014, 12:10 PM

## 2014-11-10 ENCOUNTER — Encounter (HOSPITAL_COMMUNITY): Payer: Self-pay

## 2014-11-10 ENCOUNTER — Emergency Department (HOSPITAL_COMMUNITY): Payer: Medicare HMO

## 2014-11-10 ENCOUNTER — Inpatient Hospital Stay (HOSPITAL_COMMUNITY)
Admission: EM | Admit: 2014-11-10 | Discharge: 2014-11-16 | DRG: 057 | Disposition: A | Discharge status: Home / Self Care | Payer: Medicare HMO | Attending: Internal Medicine | Admitting: Internal Medicine

## 2014-11-10 ENCOUNTER — Ambulatory Visit (INDEPENDENT_AMBULATORY_CARE_PROVIDER_SITE_OTHER): Payer: Medicare HMO | Admitting: Otolaryngology

## 2014-11-10 DIAGNOSIS — R1310 Dysphagia, unspecified: Secondary | ICD-10-CM

## 2014-11-10 DIAGNOSIS — R1312 Dysphagia, oropharyngeal phase: Secondary | ICD-10-CM

## 2014-11-10 DIAGNOSIS — M5416 Radiculopathy, lumbar region: Secondary | ICD-10-CM | POA: Diagnosis present

## 2014-11-10 DIAGNOSIS — G7000 Myasthenia gravis without (acute) exacerbation: Principal | ICD-10-CM | POA: Diagnosis present

## 2014-11-10 DIAGNOSIS — E041 Nontoxic single thyroid nodule: Secondary | ICD-10-CM | POA: Diagnosis not present

## 2014-11-10 DIAGNOSIS — I10 Essential (primary) hypertension: Secondary | ICD-10-CM | POA: Diagnosis present

## 2014-11-10 DIAGNOSIS — Z87891 Personal history of nicotine dependence: Secondary | ICD-10-CM | POA: Diagnosis not present

## 2014-11-10 DIAGNOSIS — R0602 Shortness of breath: Secondary | ICD-10-CM | POA: Diagnosis not present

## 2014-11-10 DIAGNOSIS — Z8546 Personal history of malignant neoplasm of prostate: Secondary | ICD-10-CM | POA: Diagnosis not present

## 2014-11-10 DIAGNOSIS — G610 Guillain-Barre syndrome: Secondary | ICD-10-CM | POA: Diagnosis present

## 2014-11-10 DIAGNOSIS — G959 Disease of spinal cord, unspecified: Secondary | ICD-10-CM | POA: Diagnosis not present

## 2014-11-10 DIAGNOSIS — Z85828 Personal history of other malignant neoplasm of skin: Secondary | ICD-10-CM | POA: Diagnosis not present

## 2014-11-10 DIAGNOSIS — E876 Hypokalemia: Secondary | ICD-10-CM | POA: Diagnosis present

## 2014-11-10 DIAGNOSIS — K117 Disturbances of salivary secretion: Secondary | ICD-10-CM | POA: Diagnosis present

## 2014-11-10 DIAGNOSIS — E785 Hyperlipidemia, unspecified: Secondary | ICD-10-CM | POA: Diagnosis present

## 2014-11-10 DIAGNOSIS — M4802 Spinal stenosis, cervical region: Secondary | ICD-10-CM | POA: Diagnosis present

## 2014-11-10 DIAGNOSIS — Z8739 Personal history of other diseases of the musculoskeletal system and connective tissue: Secondary | ICD-10-CM | POA: Diagnosis not present

## 2014-11-10 DIAGNOSIS — M21372 Foot drop, left foot: Secondary | ICD-10-CM

## 2014-11-10 DIAGNOSIS — Z96641 Presence of right artificial hip joint: Secondary | ICD-10-CM | POA: Diagnosis present

## 2014-11-10 DIAGNOSIS — E669 Obesity, unspecified: Secondary | ICD-10-CM | POA: Diagnosis present

## 2014-11-10 DIAGNOSIS — Z9660 Presence of unspecified orthopedic joint implant: Secondary | ICD-10-CM

## 2014-11-10 DIAGNOSIS — R490 Dysphonia: Secondary | ICD-10-CM | POA: Diagnosis not present

## 2014-11-10 DIAGNOSIS — Z833 Family history of diabetes mellitus: Secondary | ICD-10-CM | POA: Diagnosis not present

## 2014-11-10 DIAGNOSIS — Z8249 Family history of ischemic heart disease and other diseases of the circulatory system: Secondary | ICD-10-CM | POA: Diagnosis not present

## 2014-11-10 HISTORY — DX: Myasthenia gravis without (acute) exacerbation: G70.00

## 2014-11-10 HISTORY — DX: Dysphagia, unspecified: R13.10

## 2014-11-10 HISTORY — DX: Dysphonia: R49.0

## 2014-11-10 LAB — CBC
HCT: 42.5 % (ref 39.0–52.0)
Hemoglobin: 14.4 g/dL (ref 13.0–17.0)
MCH: 29.8 pg (ref 26.0–34.0)
MCHC: 33.9 g/dL (ref 30.0–36.0)
MCV: 88 fL (ref 78.0–100.0)
PLATELETS: 270 10*3/uL (ref 150–400)
RBC: 4.83 MIL/uL (ref 4.22–5.81)
RDW: 13.8 % (ref 11.5–15.5)
WBC: 13.7 10*3/uL — ABNORMAL HIGH (ref 4.0–10.5)

## 2014-11-10 LAB — COMPREHENSIVE METABOLIC PANEL
ALK PHOS: 73 U/L (ref 38–126)
ALT: 19 U/L (ref 17–63)
ANION GAP: 7 (ref 5–15)
AST: 21 U/L (ref 15–41)
Albumin: 4.4 g/dL (ref 3.5–5.0)
BUN: 25 mg/dL — ABNORMAL HIGH (ref 6–20)
CALCIUM: 9.5 mg/dL (ref 8.9–10.3)
CO2: 26 mmol/L (ref 22–32)
Chloride: 106 mmol/L (ref 101–111)
Creatinine, Ser: 0.97 mg/dL (ref 0.61–1.24)
GFR calc non Af Amer: >60 mL/min (ref >60)
Glucose, Bld: 130 mg/dL — ABNORMAL HIGH (ref 65–99)
Potassium: 3.9 mmol/L (ref 3.5–5.1)
SODIUM: 139 mmol/L (ref 135–145)
TOTAL PROTEIN: 7.6 g/dL (ref 6.5–8.1)
Total Bilirubin: 0.6 mg/dL (ref 0.3–1.2)

## 2014-11-10 LAB — DIFFERENTIAL
Basophils Absolute: 0 10*3/uL (ref 0.0–0.1)
Basophils Relative: 0 %
EOS PCT: 0 %
Eosinophils Absolute: 0 10*3/uL (ref 0.0–0.7)
LYMPHS PCT: 9 %
Lymphs Abs: 1.2 10*3/uL (ref 0.7–4.0)
MONO ABS: 0.7 10*3/uL (ref 0.1–1.0)
Monocytes Relative: 5 %
Neutro Abs: 11.8 10*3/uL — ABNORMAL HIGH (ref 1.7–7.7)
Neutrophils Relative %: 86 %

## 2014-11-10 LAB — I-STAT CHEM 8, ED
BUN: 23 mg/dL — ABNORMAL HIGH (ref 6–20)
CALCIUM ION: 1.2 mmol/L (ref 1.13–1.30)
Chloride: 104 mmol/L (ref 101–111)
Creatinine, Ser: 1 mg/dL (ref 0.61–1.24)
Glucose, Bld: 129 mg/dL — ABNORMAL HIGH (ref 65–99)
HCT: 44 % (ref 39.0–52.0)
HEMOGLOBIN: 15 g/dL (ref 13.0–17.0)
Potassium: 3.9 mmol/L (ref 3.5–5.1)
SODIUM: 143 mmol/L (ref 135–145)
TCO2: 24 mmol/L (ref 0–100)

## 2014-11-10 LAB — URINE MICROSCOPIC-ADD ON

## 2014-11-10 LAB — URINALYSIS, ROUTINE W REFLEX MICROSCOPIC
Bilirubin Urine: NEGATIVE
Glucose, UA: NEGATIVE mg/dL
Ketones, ur: NEGATIVE mg/dL
LEUKOCYTES UA: NEGATIVE
NITRITE: NEGATIVE
Protein, ur: NEGATIVE mg/dL
SPECIFIC GRAVITY, URINE: 1.025 (ref 1.005–1.030)
UROBILINOGEN UA: 0.2 mg/dL (ref 0.0–1.0)
pH: 6 (ref 5.0–8.0)

## 2014-11-10 LAB — PROTIME-INR
INR: 1.16 (ref 0.00–1.49)
PROTHROMBIN TIME: 14.9 s (ref 11.6–15.2)

## 2014-11-10 LAB — RAPID URINE DRUG SCREEN, HOSP PERFORMED
AMPHETAMINES: NOT DETECTED
Barbiturates: NOT DETECTED
Benzodiazepines: NOT DETECTED
Cocaine: NOT DETECTED
OPIATES: NOT DETECTED
Tetrahydrocannabinol: NOT DETECTED

## 2014-11-10 LAB — I-STAT TROPONIN, ED: Troponin i, poc: 0 ng/mL (ref 0.00–0.08)

## 2014-11-10 LAB — ETHANOL

## 2014-11-10 LAB — APTT: aPTT: 26 seconds (ref 24–37)

## 2014-11-10 MED ORDER — SODIUM CHLORIDE 0.9 % IJ SOLN
3.0000 mL | Freq: Two times a day (BID) | INTRAMUSCULAR | Status: DC
  Administered 2014-11-11 – 2014-11-14 (×8): 3 mL via INTRAVENOUS

## 2014-11-10 MED ORDER — ONDANSETRON HCL 4 MG PO TABS: 4.0000 mg | ORAL_TABLET | Freq: Four times a day (QID) | ORAL | Status: DC | PRN

## 2014-11-10 MED ORDER — IOHEXOL 300 MG/ML  SOLN
75.0000 mL | Freq: Once | INTRAMUSCULAR | Status: AC | PRN
  Administered 2014-11-10: 75 mL via INTRAVENOUS

## 2014-11-10 MED ORDER — HEPARIN SODIUM (PORCINE) 5000 UNIT/ML IJ SOLN
5000.0000 [IU] | Freq: Three times a day (TID) | INTRAMUSCULAR | Status: DC
  Administered 2014-11-11 – 2014-11-16 (×15): 5000 [IU] via SUBCUTANEOUS
  Filled 2014-11-10 (×16): qty 1

## 2014-11-10 MED ORDER — ONDANSETRON HCL 4 MG/2ML IJ SOLN: 4.0000 mg | Freq: Four times a day (QID) | INTRAMUSCULAR | Status: DC | PRN

## 2014-11-10 MED ORDER — SODIUM CHLORIDE 0.9 % IV SOLN
INTRAVENOUS | Status: DC
  Administered 2014-11-10: 23:00:00 via INTRAVENOUS

## 2014-11-10 MED ORDER — DEXTROSE-NACL 5-0.9 % IV SOLN: INTRAVENOUS | Status: DC

## 2014-11-10 NOTE — ED Notes (Signed)
Pt sent from [place], per family, pt cannot swallow/ talk since last week and is worried food may be going into his lungs.

## 2014-11-10 NOTE — ED Notes (Signed)
Pt just returned from MRI.  

## 2014-11-10 NOTE — H&P (Addendum)
[facility] History and Physical  Duane Burns TWK:[id number] DOB: [birth year]    PCP:   [name], MD   Chief Complaint: acute onset of dysphagia and dysphonia one week ago.   HPI: Duane Burns is an 79 y.o. male with hx of 3 back surgery, left foot drop followed his 3rd surgery, hx of HTN, HLD, and right hip THA march 2016, presented to the ENT as he as awaken one day with trouble swallowing, coughing with drinking, and dysphonia.  Though I don't have ENT Dr Dawayne Cirri office note, he was sent in the ER as the posterior pharynx was not moving normally.  He denied diplopia, sorethroat, fever, chills, paresthesia, HA, stiff neck or neck pain, and has no upper or lower extremity weakness or tingling sensation.  He does have a left foot drop for many years, wearing FAO.  Evalaution in the ER included a negative CT and MRI without contrast, showing no acute CVA, moderate stenosis of the C 2 cervical root, and a 1.6-1.7 cm left thyroid nodule, Biopsy recommended.  His serology was unremarkable.   EDP spoke with Dr Merlene Laughter, and hospitalist was asked to admit him for further evaluation.  He did say he had a small non engorged tick he pulled off his foot last week.  No OTC medication or new meds.  No animal bites reported.   Rewiew of Systems:  Constitutional: Negative for malaise, fever and chills. No significant weight loss or weight gain Eyes: Negative for eye pain, redness and discharge, diplopia, visual changes, or flashes of light. ENMT: Negative for ear pain, nasal congestion, sinus pressure and sore throat. No headaches; tinnitus, drooling, or problem swallowing. Cardiovascular: Negative for chest pain, palpitations, diaphoresis, dyspnea and peripheral edema. ; No orthopnea, PND Respiratory: Negative for cough, hemoptysis, wheezing and stridor. No pleuritic chestpain. Gastrointestinal: Negative for nausea, vomiting, diarrhea, constipation, abdominal pain, melena, blood in  stool, hematemesis, jaundice and rectal bleeding.    Genitourinary: Negative for frequency, dysuria, incontinence,flank pain and hematuria; Musculoskeletal: Negative for back pain and neck pain. Negative for swelling and trauma.;  Skin: . Negative for pruritus, rash, abrasions, bruising and skin lesion.; ulcerations Neuro: Negative for headache, lightheadedness and neck stiffness. Negative for weakness, altered level of consciousness , altered mental status, extremity weakness, burning feet, involuntary movement, seizure and syncope.  Psych: negative for anxiety, depression, insomnia, tearfulness, panic attacks, hallucinations, paranoia, suicidal or homicidal ideation    Past Medical History  Diagnosis Date  . Hyperlipidemia   . Hypertension   . Foot drop, left     DUE TO NERVE INJURY IN BACK PER PT  . Pneumonia     JAN 2016  . Arthritis   . HNP (herniated nucleus pulposus with myelopathy), thoracic     HISTORY OF HNP  . Cancer     History of skin cancer / hx Prostate cancer  . Urgency of urination   . Urinary leakage     Past Surgical History  Procedure Laterality Date  . Prostate surgery    . Back surgery      X 3  . Skin cancer excision    . Tonsillectomy    . Total hip arthroplasty Right 05/10/2014    Procedure: RIGHT TOTAL HIP ARTHROPLASTY ANTERIOR APPROACH ;  Surgeon: Paralee Cancel, MD;  Location: [facility];  Service: Orthopedics;  Laterality: Right;  . Cystoscopy with urethral dilatation N/A 05/10/2014    Procedure: CYSTOSCOPY WITH URETHRAL DILATATION;  Surgeon: Alexis Frock, MD;  Location: [facility];  Service: Urology;  Laterality: N/A;  with catheter placement, prior to surgery. procedure performed on the stretcher.    Medications:  HOME MEDS: Prior to Admission medications   Medication Sig Start Date End Date Taking? Authorizing Provider  amoxicillin (AMOXIL) 500 MG capsule Take 500 mg by mouth 3 (three) times daily. Started 11/08/14   Yes Historical Provider, MD   aspirin EC 81 MG tablet Take 81 mg by mouth daily.   Yes Historical Provider, MD  calcium elemental as carbonate (TUMS ULTRA 1000) 400 MG tablet Chew 1,000 mg by mouth daily.   Yes Historical Provider, MD  cetirizine (ZYRTEC) 10 MG tablet Take 10 mg by mouth at bedtime.   Yes Historical Provider, MD  cholecalciferol (VITAMIN D) 1000 UNITS tablet Take 1,000 Units by mouth daily.   Yes Historical Provider, MD  Cinnamon 500 MG TABS Take 500 mg by mouth daily.   Yes Historical Provider, MD  lisinopril (PRINIVIL,ZESTRIL) 10 MG tablet Take 10 mg by mouth at bedtime.    Yes Historical Provider, MD  Multiple Vitamin (MULTIVITAMIN WITH MINERALS) TABS tablet Take 1 tablet by mouth daily.   Yes Historical Provider, MD  Omega-3 Fatty Acids (FISH OIL) 1200 MG CAPS Take 1,200-2,400 mg by mouth 2 (two) times daily. Take 2 capsules in the morning and 1 capsule in the evening   Yes Historical Provider, MD  pravastatin (PRAVACHOL) 80 MG tablet Take 80 mg by mouth daily.   Yes Historical Provider, MD  predniSONE (DELTASONE) 10 MG tablet Take 40 mg by mouth daily with breakfast. Patient had 4tabs today,then 3 tabs Friday,then 2tabs,Saturday,then 1tab Sunday   Yes Historical Provider, MD  docusate sodium (COLACE) 100 MG capsule Take 1 capsule (100 mg total) by mouth 2 (two) times daily. Patient not taking: Reported on 11/10/2014 05/11/14   Danae Orleans, PA-C  ferrous sulfate 325 (65 FE) MG tablet Take 1 tablet (325 mg total) by mouth 3 (three) times daily after meals. Patient not taking: Reported on 11/10/2014 05/11/14   Danae Orleans, PA-C  HYDROcodone-acetaminophen (NORCO) 7.5-325 MG per tablet Take 1-2 tablets by mouth every 4 (four) hours as needed for moderate pain. Patient not taking: Reported on 11/10/2014 05/11/14   Danae Orleans, PA-C  polyethylene glycol (MIRALAX / Floria Raveling) packet Take 17 g by mouth 2 (two) times daily. Patient not taking: Reported on 11/10/2014 05/11/14   Danae Orleans, PA-C  tiZANidine  (ZANAFLEX) 4 MG tablet Take 1 tablet (4 mg total) by mouth every 6 (six) hours as needed for muscle spasms. Patient not taking: Reported on 11/10/2014 05/11/14   Danae Orleans, PA-C     Allergies:  No Known Allergies  Social History:   reports that he quit smoking about 20 years ago. He has never used smokeless tobacco. He reports that he does not drink alcohol or use illicit drugs.  Family History: Family History  Problem Relation Age of Onset  . Diabetes Daughter   . Hypertension Daughter      Physical Exam: Filed Vitals:   11/10/14 2130 11/10/14 2200 11/10/14 2230 11/10/14 2318  BP: 160/139 147/100 157/77 153/80  Pulse: 64 66 63 76  Temp:    98.5 F (36.9 C)  TempSrc:    Oral  Resp: 17 25 20 18   Height:      Weight:      SpO2: 98% 96% 95% 100%   Blood pressure 153/80, pulse 76, temperature 98.5 F (36.9 C), temperature source Oral, resp. rate 18, height 5\' 10"  (1.778  m), weight 88.905 kg (196 lb), SpO2 100 %.  GEN:  Pleasant patient lying in the stretcher in no acute distress; cooperative with exam. PSYCH:  alert and oriented x4; does not appear anxious or depressed; affect is appropriate. HEENT: Mucous membranes pink and anicteric; PERRLA; EOM intact; no cervical lymphadenopathy nor thyromegaly or carotid bruit; no JVD; There were no stridor. Neck is very supple.  He has dysphonia.  Breasts:: Not examined CHEST WALL: No tenderness CHEST: Normal respiration, clear to auscultation bilaterally.  HEART: Regular rate and rhythm.  There are no murmur, rub, or gallops.   BACK: No kyphosis or scoliosis; no CVA tenderness ABDOMEN: soft and non-tender; no masses, no organomegaly, normal abdominal bowel sounds; no pannus; no intertriginous candida. There is no rebound and no distention. Rectal Exam: Not done EXTREMITIES: No bone or joint deformity; age-appropriate arthropathy of the hands and knees; no edema; no ulcerations.  There is no calf tenderness. Genitalia: not  examined PULSES: 2+ and symmetric SKIN: Normal hydration no rash or ulceration CNS: Cranial nerves 2-12 grossly intact no focal lateralizing neurologic deficit.  Speech is fluent; uvula elevated with phonation, facial symmetry and tongue midline. DTR are normal bilaterally, cerebella exam is intact, barbinski is negative and strengths are equaled bilaterally.  No sensory loss.   Labs on Admission:  Basic Metabolic Panel:  Recent Labs Lab 11/10/14 1621 11/10/14 1627  NA 139 143  K 3.9 3.9  CL 106 104  CO2 26  --   GLUCOSE 130* 129*  BUN 25* 23*  CREATININE 0.97 1.00  CALCIUM 9.5  --    Liver Function Tests:  Recent Labs Lab 11/10/14 1621  AST 21  ALT 19  ALKPHOS 73  BILITOT 0.6  PROT 7.6  ALBUMIN 4.4   No results for input(s): LIPASE, AMYLASE in the last 168 hours. No results for input(s): AMMONIA in the last 168 hours. CBC:  Recent Labs Lab 11/10/14 1621 11/10/14 1627  WBC 13.7*  --   NEUTROABS 11.8*  --   HGB 14.4 15.0  HCT 42.5 44.0  MCV 88.0  --   PLT 270  --    Cardiac Enzymes: No results for input(s): CKTOTAL, CKMB, CKMBINDEX, TROPONINI in the last 168 hours.  CBG: No results for input(s): GLUCAP in the last 168 hours.   Radiological Exams on Admission: Dg Chest 2 View  11/10/2014   CLINICAL DATA:  Dysphagia  EXAM: CHEST  2 VIEW  COMPARISON:  May 30, 2010  FINDINGS: There is stable elevation of the right hemidiaphragm. There is no appreciable edema or consolidation. Heart size and pulmonary vascularity are normal. No adenopathy. There is atherosclerotic change in aorta. There is degenerative change in the thoracic spine.  IMPRESSION: No edema or consolidation.  Stable elevation right hemidiaphragm.   Electronically Signed   By: Lowella Grip III M.D.   On: 11/10/2014 17:08   Ct Head Wo Contrast  11/10/2014   CLINICAL DATA:  Patient has difficulty swallowing and speaking since last week.  EXAM: CT HEAD WITHOUT CONTRAST  TECHNIQUE: Contiguous axial  images were obtained from the base of the skull through the vertex without intravenous contrast.  COMPARISON:  None.  FINDINGS: No mass effect or midline shift. No evidence of acute intracranial hemorrhage, or infarction. No abnormal extra-axial fluid collections. Gray-white matter differentiation is normal. Basal cisterns are preserved. There is mild brain parenchymal volume loss.  No depressed skull fractures. Visualized paranasal sinuses and mastoid air cells are not opacified.  IMPRESSION: No  acute intracranial abnormality.  Mild brain parenchymal atrophy.   Electronically Signed   By: Fidela Salisbury M.D.   On: 11/10/2014 16:57   Ct Soft Tissue Neck W Contrast  11/10/2014   CLINICAL DATA:  Patient cannot swallow or tall since last week.  EXAM: CT NECK WITH CONTRAST  TECHNIQUE: Multidetector CT imaging of the neck was performed using the standard protocol following the bolus administration of intravenous contrast.  CONTRAST:  87mL OMNIPAQUE IOHEXOL 300 MG/ML  SOLN  COMPARISON:  MR brain earlier today reported separately.  FINDINGS: Pharynx and larynx: Normal.  Salivary glands: Normal.  Thyroid: LEFT lobe thyroid lesion, solid appearing, 16 x 17 mm cross-section.  Lymph nodes: No pathologic adenopathy.  Vascular: BILATERAL carotid calcification, not clearly flow reducing.  Limited intracranial: Negative.  Visualized orbits: Not visualized.  Mastoids and visualized paranasal sinuses: No fluid.  Skeleton: Advanced spondylosis. Nuchal ligament calcification opposite C4 and C5.  Upper chest: No nodule or effusion. No pneumothorax. Degenerative change BILATERAL sternoclavicular joints.  IMPRESSION: No abnormality is seen within the pharynx, neck, or upper chest which might contribute to inability to swallow or talk.  16 x 17 mm solid LEFT lobe thyroid lesion. Findings meet consensus criteria for biopsy. Ultrasound-guided fine needle aspiration should be considered, as per the consensus statement: Management of  Thyroid Nodules Detected at [place]: Society of Radiologists in [place]. Radiology 2005; [id number].   Electronically Signed   By: Staci Righter M.D.   On: 11/10/2014 20:36   Mr Brain Wo Contrast  11/10/2014   CLINICAL DATA:  One week duration inability to swallow and speak normally.  EXAM: MRI HEAD WITHOUT CONTRAST  TECHNIQUE: Multiplanar, multiecho pulse sequences of the brain and surrounding structures were obtained without intravenous contrast.  COMPARISON:  CT head earlier today.  FINDINGS: No evidence for acute infarction, hemorrhage, mass lesion, hydrocephalus, or extra-axial fluid. Moderate cerebral and cerebellar atrophy. Mild subcortical and periventricular T2 and FLAIR hyperintensities, likely chronic microvascular ischemic change. Normal pituitary and cerebellar tonsils. Small remote BILATERAL cerebellar infarcts.  Moderate spinal stenosis in the upper cervical region with pannus, and ligamentum flavum hypertrophy at the C2 level resulting in apparent cord flattening. Correlate clinically for myelopathy.  Flow voids are maintained throughout the carotid, basilar, and vertebral arteries. There are no areas of chronic hemorrhage.  Visualized calvarium, skull base, and upper cervical osseous structures unremarkable. Scalp and extracranial soft tissues, orbits, sinuses, and mastoids show no acute process.  Compared with prior CT, good general agreement.  IMPRESSION: Chronic changes of atrophy and small vessel disease, without visible acute stroke or intracranial hemorrhage.  Moderate spinal stenosis in the upper cervical region related to pannus and ligamentum flavum hypertrophy at the C2 level. See discussion above.   Electronically Signed   By: Staci Righter M.D.   On: 11/10/2014 18:45   Assessment/Plan Present on Admission:  . Obese . HTN (hypertension) Dysphonia Dysphagia Left foot drop  PLAN:  This gentleman has acute dysphagia and dysphonia, with negative MRI  without contrast, admitted for further work up.  He was told by ENT his vocal cord was moving normal.  The differential includes cranial nerve palsy, demyelinating disease, acute CVA not seen in MRI, or peripheral nerve palsy, as in recurrent larryngeal nerve.  I don't think the thyroid nodule plays any role in this. He had a tickbite, but it was not engorged, so cranial nerve palsy from lyme disease is very unlikely. Diabetes can commonly cause cranial nerve palsy, but  he has no history of it.   Will check B12, lyme titer, HgA1C for DM, and consider repeating MRI with contrast.  Neurology has been consulted.  Will await further recommendation.  He is stable, but needs to be NPO.  I will consult speech for further evaluation.  Thank you.  Other plans as per orders.  Code Status: FULL Haskel Khan, MD. [facility] Pager [phone] 7pm to 7am.  11/10/2014, 11:32 PM

## 2014-11-10 NOTE — ED Provider Notes (Signed)
CSN: [id number]     Arrival date & time 11/10/14  1538 History   First MD Initiated Contact with Patient 11/10/14 1547     Chief Complaint  Patient presents with  . Dysphagia     (Consider location/radiation/quality/duration/timing/severity/associated sxs/prior Treatment) The history is provided by the patient.   79 year old male 1 week ago all of a sudden started having trouble with swallowing and trouble talking. Came on suddenly. The patient was seen by his primary care doctor and referred for evaluation by ear nose and throat which occurred today. He was evaluated by ear nose and throat and was noted to have movement problems in the pharynx. The vocal cords were apparently intact. Patient sent over here for further evaluation. Patient has been concerned aspirating his food because he chokes when he eats. Occasionally has to spit out his saliva. Patient has no other complaints. No other neuro focal complaints other than long-standing left foot drop.  Past Medical History  Diagnosis Date  . Hyperlipidemia   . Hypertension   . Foot drop, left     DUE TO NERVE INJURY IN BACK PER PT  . Pneumonia     JAN 2016  . Arthritis   . HNP (herniated nucleus pulposus with myelopathy), thoracic     HISTORY OF HNP  . Cancer     History of skin cancer / hx Prostate cancer  . Urgency of urination   . Urinary leakage    Past Surgical History  Procedure Laterality Date  . Prostate surgery    . Back surgery      X 3  . Skin cancer excision    . Tonsillectomy    . Total hip arthroplasty Right 05/10/2014    Procedure: RIGHT TOTAL HIP ARTHROPLASTY ANTERIOR APPROACH ;  Surgeon: Paralee Cancel, MD;  Location: [facility];  Service: Orthopedics;  Laterality: Right;  . Cystoscopy with urethral dilatation N/A 05/10/2014    Procedure: CYSTOSCOPY WITH URETHRAL DILATATION;  Surgeon: Alexis Frock, MD;  Location: [facility];  Service: Urology;  Laterality: N/A;  with catheter placement, prior to surgery. procedure  performed on the stretcher.   Family History  Problem Relation Age of Onset  . Diabetes Daughter   . Hypertension Daughter    Social History  Substance Use Topics  . Smoking status: Former Smoker    Quit date: 05/05/1994  . Smokeless tobacco: Never Used  . Alcohol Use: No    Review of Systems  Constitutional: Negative for fever.  HENT: Positive for trouble swallowing and voice change. Negative for congestion.   Eyes: Negative for visual disturbance.  Respiratory: Negative for shortness of breath.   Cardiovascular: Negative for chest pain.  Gastrointestinal: Negative for nausea, vomiting and abdominal pain.  Genitourinary: Negative for dysuria.  Musculoskeletal: Negative for back pain and neck pain.  Skin: Negative for rash.  Neurological: Positive for speech difficulty. Negative for headaches.  Hematological: Does not bruise/bleed easily.  Psychiatric/Behavioral: Negative for confusion.      Allergies  Review of patient's allergies indicates no known allergies.  Home Medications   Prior to Admission medications   Medication Sig Start Date End Date Taking? Authorizing Provider  amoxicillin (AMOXIL) 500 MG capsule Take 500 mg by mouth 3 (three) times daily. Started 11/08/14   Yes Historical Provider, MD  aspirin EC 81 MG tablet Take 81 mg by mouth daily.   Yes Historical Provider, MD  calcium elemental as carbonate (TUMS ULTRA 1000) 400 MG tablet Chew 1,000 mg by mouth daily.  Yes Historical Provider, MD  cetirizine (ZYRTEC) 10 MG tablet Take 10 mg by mouth at bedtime.   Yes Historical Provider, MD  cholecalciferol (VITAMIN D) 1000 UNITS tablet Take 1,000 Units by mouth daily.   Yes Historical Provider, MD  Cinnamon 500 MG TABS Take 500 mg by mouth daily.   Yes Historical Provider, MD  lisinopril (PRINIVIL,ZESTRIL) 10 MG tablet Take 10 mg by mouth at bedtime.    Yes Historical Provider, MD  Multiple Vitamin (MULTIVITAMIN WITH MINERALS) TABS tablet Take 1 tablet by mouth  daily.   Yes Historical Provider, MD  Omega-3 Fatty Acids (FISH OIL) 1200 MG CAPS Take 1,200-2,400 mg by mouth 2 (two) times daily. Take 2 capsules in the morning and 1 capsule in the evening   Yes Historical Provider, MD  pravastatin (PRAVACHOL) 80 MG tablet Take 80 mg by mouth daily.   Yes Historical Provider, MD  predniSONE (DELTASONE) 10 MG tablet Take 40 mg by mouth daily with breakfast. Patient had 4tabs today,then 3 tabs Friday,then 2tabs,Saturday,then 1tab Sunday   Yes Historical Provider, MD  docusate sodium (COLACE) 100 MG capsule Take 1 capsule (100 mg total) by mouth 2 (two) times daily. Patient not taking: Reported on 11/10/2014 05/11/14   Danae Orleans, PA-C  ferrous sulfate 325 (65 FE) MG tablet Take 1 tablet (325 mg total) by mouth 3 (three) times daily after meals. Patient not taking: Reported on 11/10/2014 05/11/14   Danae Orleans, PA-C  HYDROcodone-acetaminophen (NORCO) 7.5-325 MG per tablet Take 1-2 tablets by mouth every 4 (four) hours as needed for moderate pain. Patient not taking: Reported on 11/10/2014 05/11/14   Danae Orleans, PA-C  polyethylene glycol (MIRALAX / Floria Raveling) packet Take 17 g by mouth 2 (two) times daily. Patient not taking: Reported on 11/10/2014 05/11/14   Danae Orleans, PA-C  tiZANidine (ZANAFLEX) 4 MG tablet Take 1 tablet (4 mg total) by mouth every 6 (six) hours as needed for muscle spasms. Patient not taking: Reported on 11/10/2014 05/11/14   Danae Orleans, PA-C   BP 161/73 mmHg  Pulse 66  Temp(Src) 98.3 F (36.8 C) (Oral)  Resp 19  Ht 5\' 10"  (1.778 m)  Wt 196 lb (88.905 kg)  BMI 28.12 kg/m2  SpO2 95% Physical Exam  Constitutional: He is oriented to person, place, and time. He appears well-developed and well-nourished. No distress.  HENT:  Head: Normocephalic and atraumatic.  Mouth/Throat: Oropharynx is clear and moist.  Eyes: Conjunctivae and EOM are normal. Pupils are equal, round, and reactive to light.  Neck: Normal range of motion.   Cardiovascular: Normal rate, regular rhythm and normal heart sounds.   No murmur heard. Pulmonary/Chest: Effort normal and breath sounds normal. No respiratory distress.  Abdominal: Soft. Bowel sounds are normal. There is no tenderness.  Musculoskeletal: Normal range of motion.  Except for chronic left foot drop.  Neurological: He is alert and oriented to person, place, and time. A cranial nerve deficit is present. He exhibits normal muscle tone. Coordination normal.  No focal neuro deficits of the extremities other than not long-standing left foot drop.  Patient with some difficulty swallowing and food and formation of words. The words are appropriate for difficulty forming them. Voice seems horse.  Nursing note and vitals reviewed.   ED Course  Procedures (including critical care time) Labs Review Labs Reviewed  CBC - Abnormal; Notable for the following:    WBC 13.7 (*)    All other components within normal limits  DIFFERENTIAL - Abnormal; Notable for the following:  Neutro Abs 11.8 (*)    All other components within normal limits  COMPREHENSIVE METABOLIC PANEL - Abnormal; Notable for the following:    Glucose, Bld 130 (*)    BUN 25 (*)    All other components within normal limits  URINALYSIS, ROUTINE W REFLEX MICROSCOPIC (NOT AT [facility]) - Abnormal; Notable for the following:    Hgb urine dipstick TRACE (*)    All other components within normal limits  I-STAT CHEM 8, ED - Abnormal; Notable for the following:    BUN 23 (*)    Glucose, Bld 129 (*)    All other components within normal limits  ETHANOL  PROTIME-INR  APTT  URINE RAPID DRUG SCREEN, HOSP PERFORMED  URINE MICROSCOPIC-ADD ON  I-STAT TROPOININ, ED   Results for orders placed or performed during the hospital encounter of 11/10/14  Ethanol  Result Value Ref Range   Alcohol, Ethyl (B) <5 <5 mg/dL  Protime-INR  Result Value Ref Range   Prothrombin Time 14.9 11.6 - 15.2 seconds   INR 1.16 0.00 - 1.49  APTT   Result Value Ref Range   aPTT 26 24 - 37 seconds  CBC  Result Value Ref Range   WBC 13.7 (H) 4.0 - 10.5 K/uL   RBC 4.83 4.22 - 5.81 MIL/uL   Hemoglobin 14.4 13.0 - 17.0 g/dL   HCT 42.5 39.0 - 52.0 %   MCV 88.0 78.0 - 100.0 fL   MCH 29.8 26.0 - 34.0 pg   MCHC 33.9 30.0 - 36.0 g/dL   RDW 13.8 11.5 - 15.5 %   Platelets 270 150 - 400 K/uL  Differential  Result Value Ref Range   Neutrophils Relative % 86 %   Neutro Abs 11.8 (H) 1.7 - 7.7 K/uL   Lymphocytes Relative 9 %   Lymphs Abs 1.2 0.7 - 4.0 K/uL   Monocytes Relative 5 %   Monocytes Absolute 0.7 0.1 - 1.0 K/uL   Eosinophils Relative 0 %   Eosinophils Absolute 0.0 0.0 - 0.7 K/uL   Basophils Relative 0 %   Basophils Absolute 0.0 0.0 - 0.1 K/uL  Comprehensive metabolic panel  Result Value Ref Range   Sodium 139 135 - 145 mmol/L   Potassium 3.9 3.5 - 5.1 mmol/L   Chloride 106 101 - 111 mmol/L   CO2 26 22 - 32 mmol/L   Glucose, Bld 130 (H) 65 - 99 mg/dL   BUN 25 (H) 6 - 20 mg/dL   Creatinine, Ser 0.97 0.61 - 1.24 mg/dL   Calcium 9.5 8.9 - 10.3 mg/dL   Total Protein 7.6 6.5 - 8.1 g/dL   Albumin 4.4 3.5 - 5.0 g/dL   AST 21 15 - 41 U/L   ALT 19 17 - 63 U/L   Alkaline Phosphatase 73 38 - 126 U/L   Total Bilirubin 0.6 0.3 - 1.2 mg/dL   GFR calc non Af Amer >60 >60 mL/min   GFR calc Af Amer >60 >60 mL/min   Anion gap 7 5 - 15  Urine rapid drug screen (hosp performed)not at [facility]  Result Value Ref Range   Opiates NONE DETECTED NONE DETECTED   Cocaine NONE DETECTED NONE DETECTED   Benzodiazepines NONE DETECTED NONE DETECTED   Amphetamines NONE DETECTED NONE DETECTED   Tetrahydrocannabinol NONE DETECTED NONE DETECTED   Barbiturates NONE DETECTED NONE DETECTED  Urinalysis, Routine w reflex microscopic (not at [facility])  Result Value Ref Range   Color, Urine YELLOW YELLOW   APPearance CLEAR CLEAR  Specific Gravity, Urine 1.025 1.005 - 1.030   pH 6.0 5.0 - 8.0   Glucose, UA NEGATIVE NEGATIVE mg/dL   Hgb urine dipstick TRACE (A)  NEGATIVE   Bilirubin Urine NEGATIVE NEGATIVE   Ketones, ur NEGATIVE NEGATIVE mg/dL   Protein, ur NEGATIVE NEGATIVE mg/dL   Urobilinogen, UA 0.2 0.0 - 1.0 mg/dL   Nitrite NEGATIVE NEGATIVE   Leukocytes, UA NEGATIVE NEGATIVE  Urine microscopic-add on  Result Value Ref Range   Squamous Epithelial / LPF RARE RARE   WBC, UA 0-2 <3 WBC/hpf   RBC / HPF 0-2 <3 RBC/hpf   Bacteria, UA RARE RARE  I-Stat Chem 8, ED  (not at [facility])  Result Value Ref Range   Sodium 143 135 - 145 mmol/L   Potassium 3.9 3.5 - 5.1 mmol/L   Chloride 104 101 - 111 mmol/L   BUN 23 (H) 6 - 20 mg/dL   Creatinine, Ser 1.00 0.61 - 1.24 mg/dL   Glucose, Bld 129 (H) 65 - 99 mg/dL   Calcium, Ion 1.20 1.13 - 1.30 mmol/L   TCO2 24 0 - 100 mmol/L   Hemoglobin 15.0 13.0 - 17.0 g/dL   HCT 44.0 39.0 - 52.0 %  I-stat troponin, ED (not at [facility])  Result Value Ref Range   Troponin i, poc 0.00 0.00 - 0.08 ng/mL   Comment 3             Imaging Review Dg Chest 2 View  11/10/2014   CLINICAL DATA:  Dysphagia  EXAM: CHEST  2 VIEW  COMPARISON:  May 30, 2010  FINDINGS: There is stable elevation of the right hemidiaphragm. There is no appreciable edema or consolidation. Heart size and pulmonary vascularity are normal. No adenopathy. There is atherosclerotic change in aorta. There is degenerative change in the thoracic spine.  IMPRESSION: No edema or consolidation.  Stable elevation right hemidiaphragm.   Electronically Signed   By: Lowella Grip III M.D.   On: 11/10/2014 17:08   Ct Head Wo Contrast  11/10/2014   CLINICAL DATA:  Patient has difficulty swallowing and speaking since last week.  EXAM: CT HEAD WITHOUT CONTRAST  TECHNIQUE: Contiguous axial images were obtained from the base of the skull through the vertex without intravenous contrast.  COMPARISON:  None.  FINDINGS: No mass effect or midline shift. No evidence of acute intracranial hemorrhage, or infarction. No abnormal extra-axial fluid collections. Gray-white matter  differentiation is normal. Basal cisterns are preserved. There is mild brain parenchymal volume loss.  No depressed skull fractures. Visualized paranasal sinuses and mastoid air cells are not opacified.  IMPRESSION: No acute intracranial abnormality.  Mild brain parenchymal atrophy.   Electronically Signed   By: Fidela Salisbury M.D.   On: 11/10/2014 16:57   Ct Soft Tissue Neck W Contrast  11/10/2014   CLINICAL DATA:  Patient cannot swallow or tall since last week.  EXAM: CT NECK WITH CONTRAST  TECHNIQUE: Multidetector CT imaging of the neck was performed using the standard protocol following the bolus administration of intravenous contrast.  CONTRAST:  78mL OMNIPAQUE IOHEXOL 300 MG/ML  SOLN  COMPARISON:  MR brain earlier today reported separately.  FINDINGS: Pharynx and larynx: Normal.  Salivary glands: Normal.  Thyroid: LEFT lobe thyroid lesion, solid appearing, 16 x 17 mm cross-section.  Lymph nodes: No pathologic adenopathy.  Vascular: BILATERAL carotid calcification, not clearly flow reducing.  Limited intracranial: Negative.  Visualized orbits: Not visualized.  Mastoids and visualized paranasal sinuses: No fluid.  Skeleton: Advanced spondylosis.  Nuchal ligament calcification opposite C4 and C5.  Upper chest: No nodule or effusion. No pneumothorax. Degenerative change BILATERAL sternoclavicular joints.  IMPRESSION: No abnormality is seen within the pharynx, neck, or upper chest which might contribute to inability to swallow or talk.  16 x 17 mm solid LEFT lobe thyroid lesion. Findings meet consensus criteria for biopsy. Ultrasound-guided fine needle aspiration should be considered, as per the consensus statement: Management of Thyroid Nodules Detected at [place]: Society of Radiologists in [place]. Radiology 2005; [id number].   Electronically Signed   By: Staci Righter M.D.   On: 11/10/2014 20:36   Mr Brain Wo Contrast  11/10/2014   CLINICAL DATA:  One week duration  inability to swallow and speak normally.  EXAM: MRI HEAD WITHOUT CONTRAST  TECHNIQUE: Multiplanar, multiecho pulse sequences of the brain and surrounding structures were obtained without intravenous contrast.  COMPARISON:  CT head earlier today.  FINDINGS: No evidence for acute infarction, hemorrhage, mass lesion, hydrocephalus, or extra-axial fluid. Moderate cerebral and cerebellar atrophy. Mild subcortical and periventricular T2 and FLAIR hyperintensities, likely chronic microvascular ischemic change. Normal pituitary and cerebellar tonsils. Small remote BILATERAL cerebellar infarcts.  Moderate spinal stenosis in the upper cervical region with pannus, and ligamentum flavum hypertrophy at the C2 level resulting in apparent cord flattening. Correlate clinically for myelopathy.  Flow voids are maintained throughout the carotid, basilar, and vertebral arteries. There are no areas of chronic hemorrhage.  Visualized calvarium, skull base, and upper cervical osseous structures unremarkable. Scalp and extracranial soft tissues, orbits, sinuses, and mastoids show no acute process.  Compared with prior CT, good general agreement.  IMPRESSION: Chronic changes of atrophy and small vessel disease, without visible acute stroke or intracranial hemorrhage.  Moderate spinal stenosis in the upper cervical region related to pannus and ligamentum flavum hypertrophy at the C2 level. See discussion above.   Electronically Signed   By: Staci Righter M.D.   On: 11/10/2014 18:45   I have personally reviewed and evaluated these images and lab results as part of my medical decision-making.   EKG Interpretation   Date/Time:  Thursday November 10 2014 16:13:06 EDT Ventricular Rate:  64 PR Interval:  204 QRS Duration: 95 QT Interval:  403 QTC Calculation: 416 R Axis:   1 Text Interpretation:  Sinus rhythm No significant change since last  tracing Confirmed by Gorje Iyer  MD, [facility] [phone]) on 11/10/2014 4:22:50 PM           MDM   Final diagnoses:  Dysphagia   Patient with acute onset of dysphagia difficulty swallowing difficulty forming words some voice change. This occurred a week ago and it was acute. Patient was referred by primary care doctor to ear nose and throat was seen by Dr. Melene Plan today. Noted that there was abnormal movement of the pharynx however vocal cords were apparently working properly.  Patient with extensive workup here with concerns for probably a stroke. CT head was negative MRI of the brain was negative for anything to explain the dysphagia. Soft tissue neck with contrast also had no significant findings other than a left-sided thyroid nodule that was solid and measuring 16 x 17 mm. This could be an possible explanation.  Discussed with neurology they're recommending admission for workup central cause is not completely ruled out but they're leaning towards more peripheral cause. Discussed with hospitalist they will admit to telemetry.   Patient while eating per the past week was concerned about perhaps aspirating his food chest x-rays negative for  aspiration pneumonia. Patient does have difficulty with swallowing at times also has difficulty handling his saliva.  No other focal deficits other than the long-standing left-sided foot drop that occurred after back surgery.    Fredia Sorrow, MD 11/10/14 2234

## 2014-11-11 ENCOUNTER — Inpatient Hospital Stay (HOSPITAL_COMMUNITY): Payer: Medicare HMO

## 2014-11-11 ENCOUNTER — Encounter (HOSPITAL_COMMUNITY)
Admission: EM | Disposition: A | Discharge status: Home / Self Care | Payer: Self-pay | Source: Home / Self Care | Attending: Internal Medicine

## 2014-11-11 DIAGNOSIS — R490 Dysphonia: Secondary | ICD-10-CM | POA: Diagnosis present

## 2014-11-11 DIAGNOSIS — I10 Essential (primary) hypertension: Secondary | ICD-10-CM

## 2014-11-11 DIAGNOSIS — R0602 Shortness of breath: Secondary | ICD-10-CM | POA: Diagnosis present

## 2014-11-11 DIAGNOSIS — E041 Nontoxic single thyroid nodule: Secondary | ICD-10-CM | POA: Diagnosis present

## 2014-11-11 DIAGNOSIS — R1310 Dysphagia, unspecified: Secondary | ICD-10-CM

## 2014-11-11 DIAGNOSIS — Z8739 Personal history of other diseases of the musculoskeletal system and connective tissue: Secondary | ICD-10-CM

## 2014-11-11 HISTORY — DX: Dysphonia: R49.0

## 2014-11-11 LAB — C-REACTIVE PROTEIN: CRP: <0.5 mg/dL (ref <1.0)

## 2014-11-11 LAB — SEDIMENTATION RATE: SED RATE: 12 mm/h (ref 0–16)

## 2014-11-11 LAB — CK TOTAL AND CKMB (NOT AT ARMC)
CK, MB: 4.4 ng/mL (ref 0.5–5.0)
RELATIVE INDEX: INVALID (ref 0.0–2.5)
Total CK: 82 U/L (ref 49–397)

## 2014-11-11 LAB — VITAMIN B12: Vitamin B-12: 513 pg/mL (ref 180–914)

## 2014-11-11 LAB — MRSA PCR SCREENING: MRSA by PCR: NEGATIVE

## 2014-11-11 LAB — TSH: TSH: 0.612 u[IU]/mL (ref 0.350–4.500)

## 2014-11-11 SURGERY — EGD (ESOPHAGOGASTRODUODENOSCOPY)
Anesthesia: Moderate Sedation

## 2014-11-11 MED ORDER — ALBUTEROL SULFATE (2.5 MG/3ML) 0.083% IN NEBU
INHALATION_SOLUTION | RESPIRATORY_TRACT | Status: AC
  Filled 2014-11-11: qty 3

## 2014-11-11 MED ORDER — ALBUTEROL SULFATE (2.5 MG/3ML) 0.083% IN NEBU
2.5000 mg | INHALATION_SOLUTION | Freq: Four times a day (QID) | RESPIRATORY_TRACT | Status: DC
  Administered 2014-11-11: 2.5 mg via RESPIRATORY_TRACT

## 2014-11-11 MED ORDER — PANTOPRAZOLE SODIUM 40 MG IV SOLR
40.0000 mg | INTRAVENOUS | Status: DC
  Administered 2014-11-11 – 2014-11-15 (×5): 40 mg via INTRAVENOUS
  Filled 2014-11-11 (×5): qty 40

## 2014-11-11 MED ORDER — ACETYLCYSTEINE 20 % IN SOLN
3.0000 mL | Freq: Four times a day (QID) | RESPIRATORY_TRACT | Status: DC
  Administered 2014-11-11: 3 mL via RESPIRATORY_TRACT

## 2014-11-11 MED ORDER — ACETYLCYSTEINE 20 % IN SOLN
RESPIRATORY_TRACT | Status: AC
  Filled 2014-11-11: qty 4

## 2014-11-11 MED ORDER — ALBUTEROL SULFATE (2.5 MG/3ML) 0.083% IN NEBU
2.5000 mg | INHALATION_SOLUTION | Freq: Three times a day (TID) | RESPIRATORY_TRACT | Status: DC
  Administered 2014-11-11 – 2014-11-12 (×3): 2.5 mg via RESPIRATORY_TRACT
  Filled 2014-11-11 (×3): qty 3

## 2014-11-11 MED ORDER — INFLUENZA VAC SPLIT QUAD 0.5 ML IM SUSY
0.5000 mL | PREFILLED_SYRINGE | INTRAMUSCULAR | Status: AC
  Administered 2014-11-15: 0.5 mL via INTRAMUSCULAR
  Filled 2014-11-11 (×2): qty 0.5

## 2014-11-11 MED ORDER — ACETYLCYSTEINE 20 % IN SOLN
3.0000 mL | Freq: Three times a day (TID) | RESPIRATORY_TRACT | Status: DC
  Administered 2014-11-11 – 2014-11-12 (×3): 3 mL via RESPIRATORY_TRACT
  Filled 2014-11-11 (×3): qty 4

## 2014-11-11 MED ORDER — IMMUNE GLOBULIN (HUMAN) 20 GM/200ML IV SOLN
400.0000 mg/kg | INTRAVENOUS | Status: AC
  Administered 2014-11-11 – 2014-11-15 (×5): 35 g via INTRAVENOUS
  Filled 2014-11-11: qty 100
  Filled 2014-11-11 (×3): qty 350
  Filled 2014-11-11: qty 50

## 2014-11-11 MED ORDER — KCL IN DEXTROSE-NACL 20-5-0.9 MEQ/L-%-% IV SOLN
INTRAVENOUS | Status: DC
  Administered 2014-11-11 – 2014-11-13 (×4): via INTRAVENOUS

## 2014-11-11 NOTE — Consult Note (Signed)
Referring Provider: No ref. provider found Primary Care Physician:  [name], MD Primary Gastroenterologist:  Dr. Oneida Alar  Date of Admission: 11/10/14 Date of Consultation: 11/10/13  Reason for Consultation:  Acute onset dysphagia/dysphonia  HPI:  79 year old male with a PMH of skin/prostate CA, thoracic herniated nucleus pulposus, pneumonia, hta presented to the ER yesterday with acute onset dysphagia/dysphonia. Per the patient and his wife he had new dysphagia about a week ago (11/02/14) with every meal, difficulty swallowing solid foods and regurgitation. Symptoms progressed to the point where the patient was unable to swallow much of anything. Last oral intake was yesterday morning consisting of half a banana. Saw ENT yesterday (Dr. Velva Harman) who sent the patient to he ER stating the posterior pharynx not moving normally. ER showed negative CT and MRI without contrast including no CVA, moderate stenosis of C2 cervical root. Labs normal. Neuro has been consulted. At this point the patient is having difficulty handling his secretions and requires a yankaur. Is having shortness of breath with clearing secretions. Also admits some nausea, but unable to vomit. Denies abdominal pain, vomiting, GERD symptoms. Has never had EGD before. Denies hematochezia and melena.   Past Medical History  Diagnosis Date  . Hyperlipidemia   . Hypertension   . Foot drop, left     DUE TO NERVE INJURY IN BACK PER PT  . Pneumonia     JAN 2016  . Arthritis   . HNP (herniated nucleus pulposus with myelopathy), thoracic     HISTORY OF HNP  . Cancer     History of skin cancer / hx Prostate cancer  . Urgency of urination   . Urinary leakage     Past Surgical History  Procedure Laterality Date  . Prostate surgery    . Back surgery      X 3  . Skin cancer excision    . Tonsillectomy    . Total hip arthroplasty Right 05/10/2014    Procedure: RIGHT TOTAL HIP ARTHROPLASTY ANTERIOR APPROACH ;  Surgeon:  Paralee Cancel, MD;  Location: [facility];  Service: Orthopedics;  Laterality: Right;  . Cystoscopy with urethral dilatation N/A 05/10/2014    Procedure: CYSTOSCOPY WITH URETHRAL DILATATION;  Surgeon: Alexis Frock, MD;  Location: [facility];  Service: Urology;  Laterality: N/A;  with catheter placement, prior to surgery. procedure performed on the stretcher.    Prior to Admission medications   Medication Sig Start Date End Date Taking? Authorizing Provider  amoxicillin (AMOXIL) 500 MG capsule Take 500 mg by mouth 3 (three) times daily. Started 11/08/14   Yes Historical Provider, MD  aspirin EC 81 MG tablet Take 81 mg by mouth daily.   Yes Historical Provider, MD  calcium elemental as carbonate (TUMS ULTRA 1000) 400 MG tablet Chew 1,000 mg by mouth daily.   Yes Historical Provider, MD  cetirizine (ZYRTEC) 10 MG tablet Take 10 mg by mouth at bedtime.   Yes Historical Provider, MD  cholecalciferol (VITAMIN D) 1000 UNITS tablet Take 1,000 Units by mouth daily.   Yes Historical Provider, MD  Cinnamon 500 MG TABS Take 500 mg by mouth daily.   Yes Historical Provider, MD  lisinopril (PRINIVIL,ZESTRIL) 10 MG tablet Take 10 mg by mouth at bedtime.    Yes Historical Provider, MD  Multiple Vitamin (MULTIVITAMIN WITH MINERALS) TABS tablet Take 1 tablet by mouth daily.   Yes Historical Provider, MD  Omega-3 Fatty Acids (FISH OIL) 1200 MG CAPS Take 1,200-2,400 mg by mouth 2 (two) times daily.  Take 2 capsules in the morning and 1 capsule in the evening   Yes Historical Provider, MD  pravastatin (PRAVACHOL) 80 MG tablet Take 80 mg by mouth daily.   Yes Historical Provider, MD  predniSONE (DELTASONE) 10 MG tablet Take 40 mg by mouth daily with breakfast. Patient had 4tabs today,then 3 tabs Friday,then 2tabs,Saturday,then 1tab Sunday   Yes Historical Provider, MD  docusate sodium (COLACE) 100 MG capsule Take 1 capsule (100 mg total) by mouth 2 (two) times daily. Patient not taking: Reported on 11/10/2014 05/11/14   Danae Orleans, PA-C  ferrous sulfate 325 (65 FE) MG tablet Take 1 tablet (325 mg total) by mouth 3 (three) times daily after meals. Patient not taking: Reported on 11/10/2014 05/11/14   Danae Orleans, PA-C  HYDROcodone-acetaminophen (NORCO) 7.5-325 MG per tablet Take 1-2 tablets by mouth every 4 (four) hours as needed for moderate pain. Patient not taking: Reported on 11/10/2014 05/11/14   Danae Orleans, PA-C  polyethylene glycol (MIRALAX / Floria Raveling) packet Take 17 g by mouth 2 (two) times daily. Patient not taking: Reported on 11/10/2014 05/11/14   Danae Orleans, PA-C  tiZANidine (ZANAFLEX) 4 MG tablet Take 1 tablet (4 mg total) by mouth every 6 (six) hours as needed for muscle spasms. Patient not taking: Reported on 11/10/2014 05/11/14   Danae Orleans, PA-C    Current Facility-Administered Medications  Medication Dose Route Frequency Provider Last Rate Last Dose  . acetylcysteine (MUCOMYST) 20 % nebulizer / oral solution 3 mL  3 mL Nebulization TID Rexene Alberts, MD      . acetylcysteine (MUCOMYST) 20 % nebulizer / oral solution           . albuterol (PROVENTIL) (2.5 MG/3ML) 0.083% nebulizer solution 2.5 mg  2.5 mg Nebulization QID Rexene Alberts, MD   2.5 mg at 11/11/14 0845  . albuterol (PROVENTIL) (2.5 MG/3ML) 0.083% nebulizer solution           . dextrose 5 % and 0.9 % NaCl with KCl 20 mEq/L infusion   Intravenous Continuous Rexene Alberts, MD      . heparin injection 5,000 Units  5,000 Units Subcutaneous Q8H Orvan Falconer, MD   5,000 Units at 11/11/14 0030  . ondansetron (ZOFRAN) tablet 4 mg  4 mg Oral Q6H PRN Orvan Falconer, MD       Or  . ondansetron [facility]) injection 4 mg  4 mg Intravenous Q6H PRN Orvan Falconer, MD      . sodium chloride 0.9 % injection 3 mL  3 mL Intravenous Q12H Orvan Falconer, MD   3 mL at 11/11/14 0000    Allergies as of 11/10/2014  . (No Known Allergies)    Family History  Problem Relation Age of Onset  . Diabetes Daughter   . Hypertension Daughter     Social History   Social  History  . Marital Status: Married    Spouse Name: N/A  . Number of Children: N/A  . Years of Education: N/A   Occupational History  . Not on file.   Social History Main Topics  . Smoking status: Former Smoker    Quit date: 05/05/1994  . Smokeless tobacco: Never Used  . Alcohol Use: No  . Drug Use: No  . Sexual Activity: Not on file   Other Topics Concern  . Not on file   Social History Narrative    Review of Systems: 10 point ROS negative except as per HPI.  Physical Exam: Vital signs in last 24 hours: Temp:  [  97.7 F (36.5 C)-98.5 F (36.9 C)] 97.7 F (36.5 C) (09/16 0543) Pulse Rate:  [53-77] 53 (09/16 0543) Resp:  [13-25] 16 (09/16 0543) BP: (126-166)/(48-139) 165/77 mmHg (09/16 0543) SpO2:  [95 %-100 %] 98 % (09/16 0700) FiO2 (%):  [28 %] 28 % (09/16 0700) Weight:  [196 lb (88.905 kg)] 196 lb (88.905 kg) (09/15 1820) Last BM Date: 11/10/14 General:   Alert,  Well-developed, well-nourished, pleasant and cooperative in NAD. Dysphonia noted. Head:  Normocephalic and atraumatic. Eyes:  Sclera clear, no icterus.  Conjunctiva pink. Ears:  Normal auditory acuity. Mouth:  Posterior OP clear, no gross edema noted. Neck:  Supple; no masses or thyromegaly. Minor stridorous notes noted. Lungs:  Sonorous wheezing noted. No acute distress. Heart:  Regular rate and rhythm; no murmurs, clicks, rubs,  or gallops. Abdomen:  Soft, nontender and nondistended. No masses, hepatosplenomegaly or hernias noted. Normal bowel sounds, without guarding, and without rebound.   Rectal:  Deferred.   Pulses:  Normal DP pulses noted. Extremities:  Without clubbing or edema. Neurologic:  Alert and  oriented x4;  grossly normal neurologically. Skin:  Intact without significant lesions or rashes. Psych:  Alert and cooperative. Normal mood and affect.  Intake/Output from previous day:   Intake/Output this shift:    Lab Results:  Recent Labs  11/10/14 1621 11/10/14 1627  WBC 13.7*  --    HGB 14.4 15.0  HCT 42.5 44.0  PLT 270  --    BMET  Recent Labs  11/10/14 1621 11/10/14 1627  NA 139 143  K 3.9 3.9  CL 106 104  CO2 26  --   GLUCOSE 130* 129*  BUN 25* 23*  CREATININE 0.97 1.00  CALCIUM 9.5  --    LFT  Recent Labs  11/10/14 1621  PROT 7.6  ALBUMIN 4.4  AST 21  ALT 19  ALKPHOS 73  BILITOT 0.6   PT/INR  Recent Labs  11/10/14 1621  LABPROT 14.9  INR 1.16   Hepatitis Panel No results for input(s): HEPBSAG, HCVAB, HEPAIGM, HEPBIGM in the last 72 hours. C-Diff No results for input(s): CDIFFTOX in the last 72 hours.  Studies/Results: Dg Chest 2 View  11/10/2014   CLINICAL DATA:  Dysphagia  EXAM: CHEST  2 VIEW  COMPARISON:  May 30, 2010  FINDINGS: There is stable elevation of the right hemidiaphragm. There is no appreciable edema or consolidation. Heart size and pulmonary vascularity are normal. No adenopathy. There is atherosclerotic change in aorta. There is degenerative change in the thoracic spine.  IMPRESSION: No edema or consolidation.  Stable elevation right hemidiaphragm.   Electronically Signed   By: Lowella Grip III M.D.   On: 11/10/2014 17:08   Ct Head Wo Contrast  11/10/2014   CLINICAL DATA:  Patient has difficulty swallowing and speaking since last week.  EXAM: CT HEAD WITHOUT CONTRAST  TECHNIQUE: Contiguous axial images were obtained from the base of the skull through the vertex without intravenous contrast.  COMPARISON:  None.  FINDINGS: No mass effect or midline shift. No evidence of acute intracranial hemorrhage, or infarction. No abnormal extra-axial fluid collections. Gray-white matter differentiation is normal. Basal cisterns are preserved. There is mild brain parenchymal volume loss.  No depressed skull fractures. Visualized paranasal sinuses and mastoid air cells are not opacified.  IMPRESSION: No acute intracranial abnormality.  Mild brain parenchymal atrophy.   Electronically Signed   By: Fidela Salisbury M.D.   On:  11/10/2014 16:57   Ct Soft Tissue Neck W  Contrast  11/10/2014   CLINICAL DATA:  Patient cannot swallow or tall since last week.  EXAM: CT NECK WITH CONTRAST  TECHNIQUE: Multidetector CT imaging of the neck was performed using the standard protocol following the bolus administration of intravenous contrast.  CONTRAST:  3mL OMNIPAQUE IOHEXOL 300 MG/ML  SOLN  COMPARISON:  MR brain earlier today reported separately.  FINDINGS: Pharynx and larynx: Normal.  Salivary glands: Normal.  Thyroid: LEFT lobe thyroid lesion, solid appearing, 16 x 17 mm cross-section.  Lymph nodes: No pathologic adenopathy.  Vascular: BILATERAL carotid calcification, not clearly flow reducing.  Limited intracranial: Negative.  Visualized orbits: Not visualized.  Mastoids and visualized paranasal sinuses: No fluid.  Skeleton: Advanced spondylosis. Nuchal ligament calcification opposite C4 and C5.  Upper chest: No nodule or effusion. No pneumothorax. Degenerative change BILATERAL sternoclavicular joints.  IMPRESSION: No abnormality is seen within the pharynx, neck, or upper chest which might contribute to inability to swallow or talk.  16 x 17 mm solid LEFT lobe thyroid lesion. Findings meet consensus criteria for biopsy. Ultrasound-guided fine needle aspiration should be considered, as per the consensus statement: Management of Thyroid Nodules Detected at [place]: Society of Radiologists in [place]. Radiology 2005; [id number].   Electronically Signed   By: Staci Righter M.D.   On: 11/10/2014 20:36   Mr Brain Wo Contrast  11/10/2014   CLINICAL DATA:  One week duration inability to swallow and speak normally.  EXAM: MRI HEAD WITHOUT CONTRAST  TECHNIQUE: Multiplanar, multiecho pulse sequences of the brain and surrounding structures were obtained without intravenous contrast.  COMPARISON:  CT head earlier today.  FINDINGS: No evidence for acute infarction, hemorrhage, mass lesion, hydrocephalus, or extra-axial  fluid. Moderate cerebral and cerebellar atrophy. Mild subcortical and periventricular T2 and FLAIR hyperintensities, likely chronic microvascular ischemic change. Normal pituitary and cerebellar tonsils. Small remote BILATERAL cerebellar infarcts.  Moderate spinal stenosis in the upper cervical region with pannus, and ligamentum flavum hypertrophy at the C2 level resulting in apparent cord flattening. Correlate clinically for myelopathy.  Flow voids are maintained throughout the carotid, basilar, and vertebral arteries. There are no areas of chronic hemorrhage.  Visualized calvarium, skull base, and upper cervical osseous structures unremarkable. Scalp and extracranial soft tissues, orbits, sinuses, and mastoids show no acute process.  Compared with prior CT, good general agreement.  IMPRESSION: Chronic changes of atrophy and small vessel disease, without visible acute stroke or intracranial hemorrhage.  Moderate spinal stenosis in the upper cervical region related to pannus and ligamentum flavum hypertrophy at the C2 level. See discussion above.   Electronically Signed   By: Staci Righter M.D.   On: 11/10/2014 18:45    Impression: 79 year old male with new onset dysphagia/dysphonia approximately a week ago including regurgitation of solid foods. Symptoms progressed until he was not longer able to eat or drink. Now with difficulty handling secretions. OP without gross edema. stridorous sounds likely phlegm and/or saliva unable to clear. Discussed case with Dr. Caryn Section and she is requesting consideration of EGD to evaluate for GI etiology. Neuro has also been consulted. CT of neck/throat, MRI head unremarkable without contrast  Unremarkable. Labs unremarkable. Dysphonia but otherwise seems neurologically intact. Possible GI etiologies include stricture, web, ring, severe esophagitis, or mass. Also possible neuro cause, neuro consult pending.  Per the patient and his wife, the only medications he's currently on  are lisinopril and pravastatin.  Plan: 1. Continue supportive measures. 2. Continue Zofran. 3. Start PPI 4. Plan for EGD today  with conscious sedation for further evaluation 5. Further recommendations to follow based on EGD results.     Walden Field, AGNP-C Adult & Gerontological Nurse Practitioner [facility]    LOS: 1 day     11/11/2014, 9:21 AM

## 2014-11-11 NOTE — Care Management Note (Signed)
Case Management Note  Patient Details  Name: Duane Burns MRN: [id number] Date of Birth: 1935-12-06  Subjective/Objective:                  Pt admitted from home with dysphagia and dysphonia. Pt lives with his wife and will return home at discharge. Pt is independent with ADL's. Pt does have a cane for prn use.  Action/Plan: Will continue to follow for discharge planning needs.  Expected Discharge Date:                  Expected Discharge Plan:  Home/Self Care  In-House Referral:  NA  Discharge planning Services  CM Consult  Post Acute Care Choice:  NA Choice offered to:  NA  DME Arranged:    DME Agency:     HH Arranged:    HH Agency:     Status of Service:  In process, will continue to follow  Medicare Important Message Given:    Date Medicare IM Given:    Medicare IM give by:    Date Additional Medicare IM Given:    Additional Medicare Important Message give by:     If discussed at [place] of Stay Meetings, dates discussed:    Additional Comments:  Joylene Draft, RN 11/11/2014, 2:52 PM

## 2014-11-11 NOTE — Consult Note (Signed)
[place] A. Merlene Laughter, MD     [email or web address]          Duane Burns is an 79 y.o. male.   ASSESSMENT/PLAN: Acute onset of dysarthria, dysphasia and dyspnea. The acute onset suggests an infarct but given the severe symptoms and lack of findings on imaging, neuromuscular causes seems more likely. The differential includes pharyngeal variant of Guillain-Barr syndrome, myasthenia gravis and myopathy. Given the weakness of the obicularis oculi muscles, I suspect that myasthenia gravis is more likely. The patient will be worked up for these conditions however.  The patient does seem to have mild evidence of myelopathy but this is likely incidental.  Chronic R L5 radiculopathy with the left foot drop status post lumbar surgery.  Recommendations: Blood testing for the following: Acetylcholine receptor antibodies including binding, blocking and modulating, anti-striatal muscle antibodies, ANA, C-reactive protein, sedimentation rate, and CPK. I will go ahead and treat the patient with immunoglobulins which the typical treatment for either of the conditions (pharyngeal variant Guillain-Barr syndrome versus myasthenia gravis) which the patient likely has.  The patient is a 79 year old white male who woke up 9 days ago with acute onset of dysarthria and dysphagia. Patient's symptoms persisted and he decided to seek medical attention for couple days. He was treated with steroids and amoxicillin by his primary care provider but the symptoms in fact deteriorated. He did see Dr. Benjamine Mola whose evaluation revealed that one of his vocal cords was apparently not working properly. The patient was admitted for further evaluation given the risk of aspiration. He was transferred to the unit because of some dyspnea or this morning associated with excessive salivation. Patient had to be suctioned vigorously. He reports that his dyspnea has improved. The patient denies headache, diplopia,  weakness of the extremities or difficulty ambulating. No numbness or tingling is reported of the extremities. The patient however is noted to have a left footdrop and apparently this occurred many years ago after back surgery. The review of systems otherwise negative.  GENERAL: This is a very pleasant male in no acute distress. He still has some excessive salivation.  HEENT: Supple. Atraumatic normocephalic. No ptosis. Neck flexion 4+ and neck extension 5. There appears to be moderate weakness of the obicularis oculi muscles bilaterally.  ABDOMEN: soft  EXTREMITIES: No edema   BACK: Normal.  SKIN: Normal by inspection.    MENTAL STATUS: Alert and oriented. Speech, language and cognition are generally intact. Judgment and insight normal.   CRANIAL NERVES: Pupils are equal, round and reactive to light and accommodation; extra ocular movements are full, there is no significant nystagmus; visual fields are full; upper and lower facial muscles are symmetric, there is no flattening of the nasolabial folds; tongue is midline; uvula is midline; shoulder elevation is normal.  MOTOR: Normal tone, bulk and strength in arms; no pronator drift. Hip flexion is 5 bilaterally. Left dorsiflexion 1/5 and right 5.  COORDINATION: Left finger to nose is normal, right finger to nose is normal, No rest tremor; no intention tremor; no postural tremor; no bradykinesia.  REFLEXES: Deep tendon reflexes are symmetrical and normal to slightly brisk in the legs. Babinski reflexes are flexor bilaterally.   SENSATION: Normal to light touch.  The brain MRI is reviewed in person and shows moderate global atrophy. There is mild periventricular leukoencephalopathy. No acute findings are noted on diffusion imaging. There is no microhemorrhages or evidence of old infarcts.   Blood pressure 133/65, pulse 62, temperature 97 F (36.1 C), temperature  source Axillary, resp. rate 21, height 5' 10"  (1.778 m), weight 88.905 kg (196  lb), SpO2 98 %.  Past Medical History  Diagnosis Date  . Hyperlipidemia   . Hypertension   . Foot drop, left     DUE TO NERVE INJURY IN BACK PER PT  . Pneumonia     JAN 2016  . Arthritis   . HNP (herniated nucleus pulposus with myelopathy), thoracic     HISTORY OF HNP  . Cancer     History of skin cancer / hx Prostate cancer  . Urgency of urination   . Urinary leakage     Past Surgical History  Procedure Laterality Date  . Prostate surgery    . Back surgery      X 3  . Skin cancer excision    . Tonsillectomy    . Total hip arthroplasty Right 05/10/2014    Procedure: RIGHT TOTAL HIP ARTHROPLASTY ANTERIOR APPROACH ;  Surgeon: Paralee Cancel, MD;  Location: [facility];  Service: Orthopedics;  Laterality: Right;  . Cystoscopy with urethral dilatation N/A 05/10/2014    Procedure: CYSTOSCOPY WITH URETHRAL DILATATION;  Surgeon: Alexis Frock, MD;  Location: [facility];  Service: Urology;  Laterality: N/A;  with catheter placement, prior to surgery. procedure performed on the stretcher.    Family History  Problem Relation Age of Onset  . Diabetes Daughter   . Hypertension Daughter     Social History:  reports that he quit smoking about 20 years ago. He has never used smokeless tobacco. He reports that he does not drink alcohol or use illicit drugs.  Allergies: No Known Allergies  Medications: Prior to Admission medications   Medication Sig Start Date End Date Taking? Authorizing Provider  amoxicillin (AMOXIL) 500 MG capsule Take 500 mg by mouth 3 (three) times daily. Started 11/08/14   Yes Historical Provider, MD  aspirin EC 81 MG tablet Take 81 mg by mouth daily.   Yes Historical Provider, MD  calcium elemental as carbonate (TUMS ULTRA 1000) 400 MG tablet Chew 1,000 mg by mouth daily.   Yes Historical Provider, MD  cetirizine (ZYRTEC) 10 MG tablet Take 10 mg by mouth at bedtime.   Yes Historical Provider, MD  cholecalciferol (VITAMIN D) 1000 UNITS tablet Take 1,000 Units by mouth daily.    Yes Historical Provider, MD  Cinnamon 500 MG TABS Take 500 mg by mouth daily.   Yes Historical Provider, MD  lisinopril (PRINIVIL,ZESTRIL) 10 MG tablet Take 10 mg by mouth at bedtime.    Yes Historical Provider, MD  Multiple Vitamin (MULTIVITAMIN WITH MINERALS) TABS tablet Take 1 tablet by mouth daily.   Yes Historical Provider, MD  Omega-3 Fatty Acids (FISH OIL) 1200 MG CAPS Take 1,200-2,400 mg by mouth 2 (two) times daily. Take 2 capsules in the morning and 1 capsule in the evening   Yes Historical Provider, MD  pravastatin (PRAVACHOL) 80 MG tablet Take 80 mg by mouth daily.   Yes Historical Provider, MD  predniSONE (DELTASONE) 10 MG tablet Take 40 mg by mouth daily with breakfast. Patient had 4tabs today,then 3 tabs Friday,then 2tabs,Saturday,then 1tab Sunday   Yes Historical Provider, MD  docusate sodium (COLACE) 100 MG capsule Take 1 capsule (100 mg total) by mouth 2 (two) times daily. Patient not taking: Reported on 11/10/2014 05/11/14   Danae Orleans, PA-C  ferrous sulfate 325 (65 FE) MG tablet Take 1 tablet (325 mg total) by mouth 3 (three) times daily after meals. Patient not taking: Reported on 11/10/2014  05/11/14   Danae Orleans, PA-C  HYDROcodone-acetaminophen (NORCO) 7.5-325 MG per tablet Take 1-2 tablets by mouth every 4 (four) hours as needed for moderate pain. Patient not taking: Reported on 11/10/2014 05/11/14   Danae Orleans, PA-C  polyethylene glycol (MIRALAX / Floria Raveling) packet Take 17 g by mouth 2 (two) times daily. Patient not taking: Reported on 11/10/2014 05/11/14   Danae Orleans, PA-C  tiZANidine (ZANAFLEX) 4 MG tablet Take 1 tablet (4 mg total) by mouth every 6 (six) hours as needed for muscle spasms. Patient not taking: Reported on 11/10/2014 05/11/14   Danae Orleans, PA-C    Scheduled Meds: . acetylcysteine  3 mL Nebulization TID  . acetylcysteine      . albuterol  2.5 mg Nebulization TID  . albuterol      . heparin  5,000 Units Subcutaneous Q8H  . pantoprazole  (PROTONIX) IV  40 mg Intravenous Q24H  . sodium chloride  3 mL Intravenous Q12H   Continuous Infusions: . dextrose 5 % and 0.9 % NaCl with KCl 20 mEq/L 75 mL/hr at 11/11/14 1200   PRN Meds:.ondansetron **OR** ondansetron (ZOFRAN) IV     Results for orders placed or performed during the hospital encounter of 11/10/14 (from the past 48 hour(s))  Ethanol     Status: None   Collection Time: 11/10/14  4:21 PM  Result Value Ref Range   Alcohol, Ethyl (B) <5 <5 mg/dL    Comment:        LOWEST DETECTABLE LIMIT FOR SERUM ALCOHOL IS 5 mg/dL FOR MEDICAL PURPOSES ONLY   Protime-INR     Status: None   Collection Time: 11/10/14  4:21 PM  Result Value Ref Range   Prothrombin Time 14.9 11.6 - 15.2 seconds   INR 1.16 0.00 - 1.49  APTT     Status: None   Collection Time: 11/10/14  4:21 PM  Result Value Ref Range   aPTT 26 24 - 37 seconds  CBC     Status: Abnormal   Collection Time: 11/10/14  4:21 PM  Result Value Ref Range   WBC 13.7 (H) 4.0 - 10.5 K/uL   RBC 4.83 4.22 - 5.81 MIL/uL   Hemoglobin 14.4 13.0 - 17.0 g/dL   HCT 42.5 39.0 - 52.0 %   MCV 88.0 78.0 - 100.0 fL   MCH 29.8 26.0 - 34.0 pg   MCHC 33.9 30.0 - 36.0 g/dL   RDW 13.8 11.5 - 15.5 %   Platelets 270 150 - 400 K/uL  Differential     Status: Abnormal   Collection Time: 11/10/14  4:21 PM  Result Value Ref Range   Neutrophils Relative % 86 %   Neutro Abs 11.8 (H) 1.7 - 7.7 K/uL   Lymphocytes Relative 9 %   Lymphs Abs 1.2 0.7 - 4.0 K/uL   Monocytes Relative 5 %   Monocytes Absolute 0.7 0.1 - 1.0 K/uL   Eosinophils Relative 0 %   Eosinophils Absolute 0.0 0.0 - 0.7 K/uL   Basophils Relative 0 %   Basophils Absolute 0.0 0.0 - 0.1 K/uL  Comprehensive metabolic panel     Status: Abnormal   Collection Time: 11/10/14  4:21 PM  Result Value Ref Range   Sodium 139 135 - 145 mmol/L   Potassium 3.9 3.5 - 5.1 mmol/L   Chloride 106 101 - 111 mmol/L   CO2 26 22 - 32 mmol/L   Glucose, Bld 130 (H) 65 - 99 mg/dL   BUN 25 (H) 6 -  20  mg/dL   Creatinine, Ser 0.97 0.61 - 1.24 mg/dL   Calcium 9.5 8.9 - 10.3 mg/dL   Total Protein 7.6 6.5 - 8.1 g/dL   Albumin 4.4 3.5 - 5.0 g/dL   AST 21 15 - 41 U/L   ALT 19 17 - 63 U/L   Alkaline Phosphatase 73 38 - 126 U/L   Total Bilirubin 0.6 0.3 - 1.2 mg/dL   GFR calc non Af Amer >60 >60 mL/min   GFR calc Af Amer >60 >60 mL/min    Comment: (NOTE) The eGFR has been calculated using the CKD EPI equation. This calculation has not been validated in all clinical situations. eGFR's persistently <60 mL/min signify possible Chronic Kidney Disease.    Anion gap 7 5 - 15  TSH     Status: None   Collection Time: 11/10/14  4:21 PM  Result Value Ref Range   TSH 0.612 0.350 - 4.500 uIU/mL  Vitamin B12     Status: None   Collection Time: 11/10/14  4:21 PM  Result Value Ref Range   Vitamin B-12 513 180 - 914 pg/mL    Comment: (NOTE) This assay is not validated for testing neonatal or myeloproliferative syndrome specimens for Vitamin B12 levels. Performed at [facility]   I-stat troponin, ED (not at [facility], [facility])     Status: None   Collection Time: 11/10/14  4:23 PM  Result Value Ref Range   Troponin i, poc 0.00 0.00 - 0.08 ng/mL   Comment 3            Comment: Due to the release kinetics of cTnI, a negative result within the first hours of the onset of symptoms does not rule out myocardial infarction with certainty. If myocardial infarction is still suspected, repeat the test at appropriate intervals.   I-Stat Chem 8, ED  (not at [facility], [facility])     Status: Abnormal   Collection Time: 11/10/14  4:27 PM  Result Value Ref Range   Sodium 143 135 - 145 mmol/L   Potassium 3.9 3.5 - 5.1 mmol/L   Chloride 104 101 - 111 mmol/L   BUN 23 (H) 6 - 20 mg/dL   Creatinine, Ser 1.00 0.61 - 1.24 mg/dL   Glucose, Bld 129 (H) 65 - 99 mg/dL   Calcium, Ion 1.20 1.13 - 1.30 mmol/L   TCO2 24 0 - 100 mmol/L   Hemoglobin 15.0 13.0 - 17.0 g/dL   HCT 44.0 39.0 - 52.0 %  Urine rapid drug screen  (hosp performed)not at [facility]     Status: None   Collection Time: 11/10/14  5:40 PM  Result Value Ref Range   Opiates NONE DETECTED NONE DETECTED   Cocaine NONE DETECTED NONE DETECTED   Benzodiazepines NONE DETECTED NONE DETECTED   Amphetamines NONE DETECTED NONE DETECTED   Tetrahydrocannabinol NONE DETECTED NONE DETECTED   Barbiturates NONE DETECTED NONE DETECTED    Comment:        DRUG SCREEN FOR MEDICAL PURPOSES ONLY.  IF CONFIRMATION IS NEEDED FOR ANY PURPOSE, NOTIFY LAB WITHIN 5 DAYS.        LOWEST DETECTABLE LIMITS FOR URINE DRUG SCREEN Drug Class       Cutoff (ng/mL) Amphetamine      1000 Barbiturate      200 Benzodiazepine   034 Tricyclics       917 Opiates          300 Cocaine          300 THC  50   Urinalysis, Routine w reflex microscopic (not at [facility])     Status: Abnormal   Collection Time: 11/10/14  5:42 PM  Result Value Ref Range   Color, Urine YELLOW YELLOW   APPearance CLEAR CLEAR   Specific Gravity, Urine 1.025 1.005 - 1.030   pH 6.0 5.0 - 8.0   Glucose, UA NEGATIVE NEGATIVE mg/dL   Hgb urine dipstick TRACE (A) NEGATIVE   Bilirubin Urine NEGATIVE NEGATIVE   Ketones, ur NEGATIVE NEGATIVE mg/dL   Protein, ur NEGATIVE NEGATIVE mg/dL   Urobilinogen, UA 0.2 0.0 - 1.0 mg/dL   Nitrite NEGATIVE NEGATIVE   Leukocytes, UA NEGATIVE NEGATIVE  Urine microscopic-add on     Status: None   Collection Time: 11/10/14  5:42 PM  Result Value Ref Range   Squamous Epithelial / LPF RARE RARE   WBC, UA 0-2 <3 WBC/hpf   RBC / HPF 0-2 <3 RBC/hpf   Bacteria, UA RARE RARE  MRSA PCR Screening     Status: None   Collection Time: 11/11/14 11:39 AM  Result Value Ref Range   MRSA by PCR NEGATIVE NEGATIVE    Comment:        The GeneXpert MRSA Assay (FDA approved for NASAL specimens only), is one component of a comprehensive MRSA colonization surveillance program. It is not intended to diagnose MRSA infection nor to guide or monitor treatment for MRSA infections.      Studies/Results:  CT NECK IMPRESSION: No abnormality is seen within the pharynx, neck, or upper chest which might contribute to inability to swallow or talk. 16 x 17 mm solid LEFT lobe thyroid lesion. Findings meet consensus criteria for biopsy. Ultrasound-guided fine needle aspiration should be considered, as per the consensus statement:   BRAIN MRI No evidence for acute infarction, hemorrhage, mass lesion, hydrocephalus, or extra-axial fluid. Moderate cerebral and cerebellar atrophy. Mild subcortical and periventricular T2 and FLAIR hyperintensities, likely chronic microvascular ischemic change. Normal pituitary and cerebellar tonsils. Small remote BILATERAL cerebellar infarcts.  Moderate spinal stenosis in the upper cervical region with pannus, and ligamentum flavum hypertrophy at the C2 level resulting in apparent cord flattening. Correlate clinically for myelopathy.  Flow voids are maintained throughout the carotid, basilar, and vertebral arteries. There are no areas of chronic hemorrhage.  Visualized calvarium, skull base, and upper cervical osseous structures unremarkable. Scalp and extracranial soft tissues, orbits, sinuses, and mastoids show no acute process.  Compared with prior CT, good general agreement.  IMPRESSION: Chronic changes of atrophy and small vessel disease, without visible acute stroke or intracranial hemorrhage.  Moderate spinal stenosis in the upper cervical region related to pannus and ligamentum flavum hypertrophy at the C2 level. See discussion above.   Kofi A. Merlene Laughter, M.D.  Diplomate, Tax adviser of Psychiatry and Neurology ( Neurology). 11/11/2014, 5:13 PM

## 2014-11-11 NOTE — Progress Notes (Signed)
Pt wasn't able to breathe due to secretions gathering in the back of his . RT had to use yankaur to suction deep in the back of his throat. The Pt's HR increased each time and his SATS would drop. Pt has had 4 episodes of choking sensation. RT thinks the Pt should be moved to the ICU for closer observartion.

## 2014-11-11 NOTE — Care Management Important Message (Signed)
Important Message  Patient Details  Name: TAIGA LUPINACCI MRN: [id number] Date of Birth: 29-Jun-1935   Medicare Important Message Given:  Yes-second notification given    Joylene Draft, RN 11/11/2014, 3:01 PM

## 2014-11-11 NOTE — Progress Notes (Signed)
Pt's upper and lower airway has good air movement

## 2014-11-11 NOTE — Progress Notes (Addendum)
[facility] PROGRESS NOTE  KHAIR CHASTEEN QIH:[id number] DOB: 11-May-1935 DOA: 11/10/2014 PCP: [name], MD    Code Status: Full code Family Communication: Discussed with wife. Disposition Plan: Discharge when clinically appropriate.   Consultants:  Gastroenterology pending  Neurology pending  Procedures:  None  Antibiotics:  None  HPI/Subjective: Patient continues to have difficulty swallowing his secretions. Difficulty swallowing his secretions causes some difficulty breathing until he is able to suction it out from the back of his throat. He is having difficulty expectorating his sputum. He denies pain with swallowing. He denies chest pain. He has shortness of breath when he is having difficulty clearing his secretions.  Objective: Filed Vitals:   11/11/14 0543  BP: 165/77  Pulse: 53  Temp: 97.7 F (36.5 C)  Resp: 16   No intake or output data in the 24 hours ending 11/11/14 0905 Filed Weights   11/10/14 1546  Weight: 88.905 kg (196 lb)    Exam:   General:  Alert 79 year old man in no acute distress at the moment.  Oropharynx: Mucous membranes are moist. No posterior pharyngeal exudates or erythema. No posterior pharyngeal edema noted.  Neck: Supple. No adenopathy, no thyromegaly, no masses palpated.  Cardiovascular: S1, S2, with no murmurs rubs or gallops.  Respiratory: Upper airway crackles and rhonchi, partially cleared with coughing.  Abdomen: Positive bowel sounds, soft, nontender, nondistended.  Musculoskeletal/extremities: No pedal edema.  Neurologic: He is alert and oriented 3. Cranial nerves II through XII appear to be intact.   Data Reviewed: Basic Metabolic Panel:  Recent Labs Lab 11/10/14 1621 11/10/14 1627  NA 139 143  K 3.9 3.9  CL 106 104  CO2 26  --   GLUCOSE 130* 129*  BUN 25* 23*  CREATININE 0.97 1.00  CALCIUM 9.5  --    Liver Function Tests:  Recent Labs Lab 11/10/14 1621  AST 21  ALT 19   ALKPHOS 73  BILITOT 0.6  PROT 7.6  ALBUMIN 4.4   No results for input(s): LIPASE, AMYLASE in the last 168 hours. No results for input(s): AMMONIA in the last 168 hours. CBC:  Recent Labs Lab 11/10/14 1621 11/10/14 1627  WBC 13.7*  --   NEUTROABS 11.8*  --   HGB 14.4 15.0  HCT 42.5 44.0  MCV 88.0  --   PLT 270  --    Cardiac Enzymes: No results for input(s): CKTOTAL, CKMB, CKMBINDEX, TROPONINI in the last 168 hours. BNP (last 3 results) No results for input(s): BNP in the last 8760 hours.  ProBNP (last 3 results) No results for input(s): PROBNP in the last 8760 hours.  CBG: No results for input(s): GLUCAP in the last 168 hours.  No results found for this or any previous visit (from the past 240 hour(s)).   Studies: Dg Chest 2 View  11/10/2014   CLINICAL DATA:  Dysphagia  EXAM: CHEST  2 VIEW  COMPARISON:  May 30, 2010  FINDINGS: There is stable elevation of the right hemidiaphragm. There is no appreciable edema or consolidation. Heart size and pulmonary vascularity are normal. No adenopathy. There is atherosclerotic change in aorta. There is degenerative change in the thoracic spine.  IMPRESSION: No edema or consolidation.  Stable elevation right hemidiaphragm.   Electronically Signed   By: Lowella Grip III M.D.   On: 11/10/2014 17:08   Ct Head Wo Contrast  11/10/2014   CLINICAL DATA:  Patient has difficulty swallowing and speaking since last week.  EXAM: CT HEAD WITHOUT CONTRAST  TECHNIQUE: Contiguous axial images were obtained from the base of the skull through the vertex without intravenous contrast.  COMPARISON:  None.  FINDINGS: No mass effect or midline shift. No evidence of acute intracranial hemorrhage, or infarction. No abnormal extra-axial fluid collections. Gray-white matter differentiation is normal. Basal cisterns are preserved. There is mild brain parenchymal volume loss.  No depressed skull fractures. Visualized paranasal sinuses and mastoid air cells are  not opacified.  IMPRESSION: No acute intracranial abnormality.  Mild brain parenchymal atrophy.   Electronically Signed   By: Fidela Salisbury M.D.   On: 11/10/2014 16:57   Ct Soft Tissue Neck W Contrast  11/10/2014   CLINICAL DATA:  Patient cannot swallow or tall since last week.  EXAM: CT NECK WITH CONTRAST  TECHNIQUE: Multidetector CT imaging of the neck was performed using the standard protocol following the bolus administration of intravenous contrast.  CONTRAST:  6mL OMNIPAQUE IOHEXOL 300 MG/ML  SOLN  COMPARISON:  MR brain earlier today reported separately.  FINDINGS: Pharynx and larynx: Normal.  Salivary glands: Normal.  Thyroid: LEFT lobe thyroid lesion, solid appearing, 16 x 17 mm cross-section.  Lymph nodes: No pathologic adenopathy.  Vascular: BILATERAL carotid calcification, not clearly flow reducing.  Limited intracranial: Negative.  Visualized orbits: Not visualized.  Mastoids and visualized paranasal sinuses: No fluid.  Skeleton: Advanced spondylosis. Nuchal ligament calcification opposite C4 and C5.  Upper chest: No nodule or effusion. No pneumothorax. Degenerative change BILATERAL sternoclavicular joints.  IMPRESSION: No abnormality is seen within the pharynx, neck, or upper chest which might contribute to inability to swallow or talk.  16 x 17 mm solid LEFT lobe thyroid lesion. Findings meet consensus criteria for biopsy. Ultrasound-guided fine needle aspiration should be considered, as per the consensus statement: Management of Thyroid Nodules Detected at [place]: Society of Radiologists in [place]. Radiology 2005; [id number].   Electronically Signed   By: Staci Righter M.D.   On: 11/10/2014 20:36   Mr Brain Wo Contrast  11/10/2014   CLINICAL DATA:  One week duration inability to swallow and speak normally.  EXAM: MRI HEAD WITHOUT CONTRAST  TECHNIQUE: Multiplanar, multiecho pulse sequences of the brain and surrounding structures were obtained without  intravenous contrast.  COMPARISON:  CT head earlier today.  FINDINGS: No evidence for acute infarction, hemorrhage, mass lesion, hydrocephalus, or extra-axial fluid. Moderate cerebral and cerebellar atrophy. Mild subcortical and periventricular T2 and FLAIR hyperintensities, likely chronic microvascular ischemic change. Normal pituitary and cerebellar tonsils. Small remote BILATERAL cerebellar infarcts.  Moderate spinal stenosis in the upper cervical region with pannus, and ligamentum flavum hypertrophy at the C2 level resulting in apparent cord flattening. Correlate clinically for myelopathy.  Flow voids are maintained throughout the carotid, basilar, and vertebral arteries. There are no areas of chronic hemorrhage.  Visualized calvarium, skull base, and upper cervical osseous structures unremarkable. Scalp and extracranial soft tissues, orbits, sinuses, and mastoids show no acute process.  Compared with prior CT, good general agreement.  IMPRESSION: Chronic changes of atrophy and small vessel disease, without visible acute stroke or intracranial hemorrhage.  Moderate spinal stenosis in the upper cervical region related to pannus and ligamentum flavum hypertrophy at the C2 level. See discussion above.   Electronically Signed   By: Staci Righter M.D.   On: 11/10/2014 18:45    Scheduled Meds: . sodium chloride   Intravenous STAT  . acetylcysteine  3 mL Nebulization TID  . acetylcysteine      . albuterol  2.5 mg Nebulization QID  .  albuterol      . heparin  5,000 Units Subcutaneous Q8H  . sodium chloride  3 mL Intravenous Q12H   Continuous Infusions: . dextrose 5 % and 0.9% NaCl     Assessment and plan: Principal Problem:   Dysphagia Active Problems:   Dysphonia   Left thyroid nodule   Obese   History of left foot drop   Essential hypertension    1. Dysphagia with dysphonia. Per the patient's history, he was recently treated with a short prednisone taper and amoxicillin for what was thought  to be acute bronchitis a few days ago. When he did not improve and was having difficulty swallowing, he was referred to ENT, Dr. Benjamine Mola. Apparently, Dr. Benjamine Mola sent the patient to the ED because his posterior pharynx was not moving normally. (I have tried to call Dr. Benjamine Mola office but it has not opened yet.) Patient is having difficulty swallowing his secretions and expectorating MRI of the patient's brain revealed no acute infarcts, but remote small cerebellar infarcts. CT of his head and neck revealed a 1.6 cm left thyroid nodule. It also revealed C2 spinal stenosis. -I have started albuterol and Mucomyst nebulizers. -I have consulted GI for evaluation via EGD or per their recommendation. -I will try to contact Dr. Benjamine Mola. -Neurology has been consulted for further evaluation of a possible neurological problem; evaluation pending. -Speech therapy has been consulted. -We'll order another chest x-ray. -We'll move the patient to the stepdown unit for close monitoring. -We'll keep him nothing by mouth for now.  Left thyroid nodule. CT of his head and neck revealed a 1.6 cm thyroid nodule. I do not believe this is continued beating to his dysphagia. -Patient's TSH is within normal limits. -Patient will need outpatient biopsy.  Hypertension. Patient's blood pressures moderately elevated. He is treated with lisinopril chronically; currently on hold. -We'll treat his hypertension with when necessary hydralazine IV.  Recent tick bite. Lyme disease DNA PCR pending. -We'll add RMSF.     Time spent: 35 minutes    [facility] Pager [phone]. If 7PM-7AM, please contact night-coverage at [email or web address], password [facility] 11/11/2014, 9:05 AM  LOS: 1 day

## 2014-11-12 DIAGNOSIS — G959 Disease of spinal cord, unspecified: Secondary | ICD-10-CM | POA: Diagnosis present

## 2014-11-12 DIAGNOSIS — E041 Nontoxic single thyroid nodule: Secondary | ICD-10-CM

## 2014-11-12 DIAGNOSIS — G7000 Myasthenia gravis without (acute) exacerbation: Secondary | ICD-10-CM | POA: Diagnosis present

## 2014-11-12 HISTORY — DX: Myasthenia gravis without (acute) exacerbation: G70.00

## 2014-11-12 LAB — BASIC METABOLIC PANEL
ANION GAP: 5 (ref 5–15)
BUN: 17 mg/dL (ref 6–20)
CO2: 27 mmol/L (ref 22–32)
Calcium: 8.2 mg/dL — ABNORMAL LOW (ref 8.9–10.3)
Chloride: 107 mmol/L (ref 101–111)
Creatinine, Ser: 0.84 mg/dL (ref 0.61–1.24)
GFR calc Af Amer: >60 mL/min (ref >60)
Glucose, Bld: 118 mg/dL — ABNORMAL HIGH (ref 65–99)
POTASSIUM: 3.4 mmol/L — AB (ref 3.5–5.1)
SODIUM: 139 mmol/L (ref 135–145)

## 2014-11-12 LAB — CBC
HEMATOCRIT: 40.3 % (ref 39.0–52.0)
HEMOGLOBIN: 13.2 g/dL (ref 13.0–17.0)
MCH: 29.3 pg (ref 26.0–34.0)
MCHC: 32.8 g/dL (ref 30.0–36.0)
MCV: 89.4 fL (ref 78.0–100.0)
Platelets: 221 10*3/uL (ref 150–400)
RBC: 4.51 MIL/uL (ref 4.22–5.81)
RDW: 13.9 % (ref 11.5–15.5)
WBC: 10.1 10*3/uL (ref 4.0–10.5)

## 2014-11-12 LAB — RPR: RPR: NONREACTIVE

## 2014-11-12 LAB — HEMOGLOBIN A1C
Hgb A1c MFr Bld: 6.1 % — ABNORMAL HIGH (ref 4.8–5.6)
Mean Plasma Glucose: 128 mg/dL

## 2014-11-12 MED ORDER — ALBUTEROL SULFATE (2.5 MG/3ML) 0.083% IN NEBU
2.5000 mg | INHALATION_SOLUTION | Freq: Two times a day (BID) | RESPIRATORY_TRACT | Status: DC
  Administered 2014-11-12 – 2014-11-13 (×3): 2.5 mg via RESPIRATORY_TRACT
  Filled 2014-11-12 (×3): qty 3

## 2014-11-12 MED ORDER — ACETYLCYSTEINE 20 % IN SOLN
3.0000 mL | Freq: Two times a day (BID) | RESPIRATORY_TRACT | Status: DC
  Administered 2014-11-12 – 2014-11-13 (×2): 3 mL via RESPIRATORY_TRACT
  Administered 2014-11-13: 4 mL via RESPIRATORY_TRACT
  Filled 2014-11-12 (×3): qty 4

## 2014-11-12 MED ORDER — HYDRALAZINE HCL 20 MG/ML IJ SOLN
5.0000 mg | INTRAMUSCULAR | Status: DC | PRN
  Administered 2014-11-14: 5 mg via INTRAVENOUS
  Filled 2014-11-12: qty 1

## 2014-11-12 NOTE — Progress Notes (Signed)
Patient ID: Duane Burns, male   DOB: 03-Jan-1936, 79 y.o.   MRN: [id number]   Assessment/Plan: ADMITTED WITH DYSPHAGIA/DYSARTHRIA AND SEEN BY NEUROLOGY. SYMPTOMS MOST LIKELY DUE TO: MYASTHENIA GRAVIS OR GBS. CLINICALLY IMPROVED AFTER IVIG. TOLERATING SECRETIONS.  PLAN: 1. CONTINUE TO MONITOR SYMPTOMS. 2. NO INDICATION FOR ENDOSCOPY AT THIS TIME.    Subjective: Since I last evaluated the patient HE IS NO LONGER REQUIRING HIGH FLOW O2. IVOG STARTED YESTERDAY. PT AND FAMILY HAVE NO ADDITIONAL QUESTIONS OR CONCERNS.  Objective: Vital signs in last 24 hours: Filed Vitals:   11/12/14 0829  BP:   Pulse:   Temp: 97.8 F (36.6 C)  Resp:      General appearance: alert, cooperative and no distress Resp: diminished breath sounds bibasilar Cardio: regular rate and rhythm GI: soft, non-tender; bowel sounds normal;   Lab Results: Cr 0.84 Hb 13.2  Studies/Results: Dg Chest Port 1 View  11/11/2014   CLINICAL DATA:  Acute onset of shortness of breath and respiratory distress today which required moving the patient into the intensive care unit.  EXAM: PORTABLE CHEST - 1 VIEW  COMPARISON:  11/10/2014 and earlier.  FINDINGS: Suboptimal inspiration. Cardiac silhouette mildly enlarged for technique and degree of inspiration, unchanged. Chronic elevation of the right hemidiaphragm and chronic scar/atelectasis at the right lung base, unchanged. Lungs otherwise clear. Pulmonary vascularity normal. No visible pleural effusions.  IMPRESSION: Suboptimal inspiration.  No acute cardiopulmonary disease.   Electronically Signed   By: Evangeline Dakin M.D.   On: 11/11/2014 12:29    Medications: I have reviewed the patient's current medications.   LOS: 5 days   [name] 08/05/2013, 2:23 PM

## 2014-11-12 NOTE — Progress Notes (Signed)
Spoke to Dr. Caryn Section about whether to give or hold flu vaccine as patient is being worked up and treated for a possible autoimmune disorder. She asked that I reschedule the vaccine to Tuesday so that it may be addressed at later time, and so that it may give his body a better chance at mounting immunity to flu when vaccine is given.

## 2014-11-12 NOTE — Progress Notes (Signed)
[facility] PROGRESS NOTE  Duane Burns VQQ:[id number] DOB: 12/12/1935 DOA: 11/10/2014 PCP: [name], MD    Code Status: Full code Family Communication: Discussed with wife. Disposition Plan: Discharge when clinically appropriate.   Consultants:  Gastroenterology  Neurology   Procedures:  None  Antibiotics:  None  HPI/Subjective: Patient says he is able to swallow his saliva much better compared to yesterday. He denies sore throat or shortness of breath.  Objective: Filed Vitals:   11/12/14 0829  BP:   Pulse:   Temp: 97.8 F (36.6 C)  Resp:     Intake/Output Summary (Last 24 hours) at 11/12/14 1017 Last data filed at 11/12/14 0839  Gross per 24 hour  Intake 2176.25 ml  Output      0 ml  Net 2176.25 ml   Filed Weights   11/10/14 1546 11/11/14 2229 11/12/14 0630  Weight: 88.905 kg (196 lb) 83.4 kg (183 lb 13.8 oz) 83.8 kg (184 lb 11.9 oz)    Exam:   General:  Alert 79 year old man in no acute distress. He looks better compared to yesterday.  Oropharynx: Mucous membranes are moist. No posterior pharyngeal exudates or erythema. No posterior pharyngeal edema noted.  Neck: Supple. No adenopathy, no thyromegaly, no masses palpated.  Cardiovascular: S1, S2, with no murmurs rubs or gallops.  Respiratory: Clear to auscultation bilaterally. Resolution of upper airway crackles.  Abdomen: Positive bowel sounds, soft, nontender, nondistended.  Musculoskeletal/extremities: No pedal edema.  Neurologic: He is alert and oriented 3.    Data Reviewed: Basic Metabolic Panel:  Recent Labs Lab 11/10/14 1621 11/10/14 1627 11/12/14 0424  NA 139 143 139  K 3.9 3.9 3.4*  CL 106 104 107  CO2 26  --  27  GLUCOSE 130* 129* 118*  BUN 25* 23* 17  CREATININE 0.97 1.00 0.84  CALCIUM 9.5  --  8.2*   Liver Function Tests:  Recent Labs Lab 11/10/14 1621  AST 21  ALT 19  ALKPHOS 73  BILITOT 0.6  PROT 7.6  ALBUMIN 4.4   No results for  input(s): LIPASE, AMYLASE in the last 168 hours. No results for input(s): AMMONIA in the last 168 hours. CBC:  Recent Labs Lab 11/10/14 1621 11/10/14 1627 11/12/14 0424  WBC 13.7*  --  10.1  NEUTROABS 11.8*  --   --   HGB 14.4 15.0 13.2  HCT 42.5 44.0 40.3  MCV 88.0  --  89.4  PLT 270  --  221   Cardiac Enzymes:  Recent Labs Lab 11/11/14 1730  CKTOTAL 82  CKMB 4.4   BNP (last 3 results) No results for input(s): BNP in the last 8760 hours.  ProBNP (last 3 results) No results for input(s): PROBNP in the last 8760 hours.  CBG: No results for input(s): GLUCAP in the last 168 hours.  Recent Results (from the past 240 hour(s))  MRSA PCR Screening     Status: None   Collection Time: 11/11/14 11:39 AM  Result Value Ref Range Status   MRSA by PCR NEGATIVE NEGATIVE Final    Comment:        The GeneXpert MRSA Assay (FDA approved for NASAL specimens only), is one component of a comprehensive MRSA colonization surveillance program. It is not intended to diagnose MRSA infection nor to guide or monitor treatment for MRSA infections.      Studies: Dg Chest 2 View  11/10/2014   CLINICAL DATA:  Dysphagia  EXAM: CHEST  2 VIEW  COMPARISON:  May 30, 2010  FINDINGS: There is stable elevation of the right hemidiaphragm. There is no appreciable edema or consolidation. Heart size and pulmonary vascularity are normal. No adenopathy. There is atherosclerotic change in aorta. There is degenerative change in the thoracic spine.  IMPRESSION: No edema or consolidation.  Stable elevation right hemidiaphragm.   Electronically Signed   By: Lowella Grip III M.D.   On: 11/10/2014 17:08   Ct Head Wo Contrast  11/10/2014   CLINICAL DATA:  Patient has difficulty swallowing and speaking since last week.  EXAM: CT HEAD WITHOUT CONTRAST  TECHNIQUE: Contiguous axial images were obtained from the base of the skull through the vertex without intravenous contrast.  COMPARISON:  None.  FINDINGS: No  mass effect or midline shift. No evidence of acute intracranial hemorrhage, or infarction. No abnormal extra-axial fluid collections. Gray-white matter differentiation is normal. Basal cisterns are preserved. There is mild brain parenchymal volume loss.  No depressed skull fractures. Visualized paranasal sinuses and mastoid air cells are not opacified.  IMPRESSION: No acute intracranial abnormality.  Mild brain parenchymal atrophy.   Electronically Signed   By: Fidela Salisbury M.D.   On: 11/10/2014 16:57   Ct Soft Tissue Neck W Contrast  11/10/2014   CLINICAL DATA:  Patient cannot swallow or tall since last week.  EXAM: CT NECK WITH CONTRAST  TECHNIQUE: Multidetector CT imaging of the neck was performed using the standard protocol following the bolus administration of intravenous contrast.  CONTRAST:  69mL OMNIPAQUE IOHEXOL 300 MG/ML  SOLN  COMPARISON:  MR brain earlier today reported separately.  FINDINGS: Pharynx and larynx: Normal.  Salivary glands: Normal.  Thyroid: LEFT lobe thyroid lesion, solid appearing, 16 x 17 mm cross-section.  Lymph nodes: No pathologic adenopathy.  Vascular: BILATERAL carotid calcification, not clearly flow reducing.  Limited intracranial: Negative.  Visualized orbits: Not visualized.  Mastoids and visualized paranasal sinuses: No fluid.  Skeleton: Advanced spondylosis. Nuchal ligament calcification opposite C4 and C5.  Upper chest: No nodule or effusion. No pneumothorax. Degenerative change BILATERAL sternoclavicular joints.  IMPRESSION: No abnormality is seen within the pharynx, neck, or upper chest which might contribute to inability to swallow or talk.  16 x 17 mm solid LEFT lobe thyroid lesion. Findings meet consensus criteria for biopsy. Ultrasound-guided fine needle aspiration should be considered, as per the consensus statement: Management of Thyroid Nodules Detected at [place]: Society of Radiologists in [place]. Radiology 2005; [id number].    Electronically Signed   By: Staci Righter M.D.   On: 11/10/2014 20:36   Mr Brain Wo Contrast  11/10/2014   CLINICAL DATA:  One week duration inability to swallow and speak normally.  EXAM: MRI HEAD WITHOUT CONTRAST  TECHNIQUE: Multiplanar, multiecho pulse sequences of the brain and surrounding structures were obtained without intravenous contrast.  COMPARISON:  CT head earlier today.  FINDINGS: No evidence for acute infarction, hemorrhage, mass lesion, hydrocephalus, or extra-axial fluid. Moderate cerebral and cerebellar atrophy. Mild subcortical and periventricular T2 and FLAIR hyperintensities, likely chronic microvascular ischemic change. Normal pituitary and cerebellar tonsils. Small remote BILATERAL cerebellar infarcts.  Moderate spinal stenosis in the upper cervical region with pannus, and ligamentum flavum hypertrophy at the C2 level resulting in apparent cord flattening. Correlate clinically for myelopathy.  Flow voids are maintained throughout the carotid, basilar, and vertebral arteries. There are no areas of chronic hemorrhage.  Visualized calvarium, skull base, and upper cervical osseous structures unremarkable. Scalp and extracranial soft tissues, orbits, sinuses, and mastoids show no acute process.  Compared with  prior CT, good general agreement.  IMPRESSION: Chronic changes of atrophy and small vessel disease, without visible acute stroke or intracranial hemorrhage.  Moderate spinal stenosis in the upper cervical region related to pannus and ligamentum flavum hypertrophy at the C2 level. See discussion above.   Electronically Signed   By: Staci Righter M.D.   On: 11/10/2014 18:45   Dg Chest Port 1 View  11/11/2014   CLINICAL DATA:  Acute onset of shortness of breath and respiratory distress today which required moving the patient into the intensive care unit.  EXAM: PORTABLE CHEST - 1 VIEW  COMPARISON:  11/10/2014 and earlier.  FINDINGS: Suboptimal inspiration. Cardiac silhouette mildly  enlarged for technique and degree of inspiration, unchanged. Chronic elevation of the right hemidiaphragm and chronic scar/atelectasis at the right lung base, unchanged. Lungs otherwise clear. Pulmonary vascularity normal. No visible pleural effusions.  IMPRESSION: Suboptimal inspiration.  No acute cardiopulmonary disease.   Electronically Signed   By: Evangeline Dakin M.D.   On: 11/11/2014 12:29    Scheduled Meds: . acetylcysteine  3 mL Nebulization TID  . albuterol  2.5 mg Nebulization TID  . heparin  5,000 Units Subcutaneous Q8H  . IMMUNE GLOBULIN 10% (HUMAN) IV - For Fluid Restriction Only  400 mg/kg Intravenous Q24H  . Influenza vac split quadrivalent PF  0.5 mL Intramuscular Tomorrow-1000  . pantoprazole (PROTONIX) IV  40 mg Intravenous Q24H  . sodium chloride  3 mL Intravenous Q12H   Continuous Infusions: . dextrose 5 % and 0.9 % NaCl with KCl 20 mEq/L 75 mL/hr at 11/12/14 0029   Assessment and plan: Principal Problem:   Dysphagia Active Problems:   Myasthenia gravis   Dysphonia   Left thyroid nodule   SOB (shortness of breath)   Cervical myelopathy   Obese   History of left foot drop   Essential hypertension    1. Dysphagia with dysphonia; secondary to myasthenia gravis versus pharyngeal variant of Guillain-Barr syndrome. Per the patient's history, he was recently treated with a short prednisone taper and amoxicillin for what was thought to be acute bronchitis a few days ago. When he did not improve and was having difficulty swallowing, he was referred to ENT, Dr. Benjamine Mola. Apparently, Dr. Benjamine Mola sent the patient to the ED because his posterior pharynx was not moving normally. I discussed the patient with Dr. Benjamine Mola on 9/16 and he reported that the patient's soft palate was not elevating properly and he was concerned about an acute stroke; otherwise, he stated there was no obvious anatomical abnormality on his exam. -MRI of the patient's brain revealed no acute infarcts, but remote  small cerebellar infarcts. -CT of his head and neck revealed a 1.6 cm left thyroid nodule. It also revealed C2 spinal stenosis. -Because of symptomatic shortness of breath and difficulty clearing his secretions, he was moved to the stepdown unit on 9/16. He was started on albuterol and Mucomyst nebulizers empirically. His follow-up chest x-ray was unremarkable. -Neurologist, Dr. Merlene Laughter was consulted. Following his assessment, his differential diagnosis included pharyngeal variant of Guillain-Barr syndrome, myasthenia gravis, and myopathy. However given the weakness of the patient's obicularis oculi muscles, he suspects myasthenia gravis is more likely. -Patient was started on immunoglobulin therapy. -Labs ordered and some are pending, but RPR is within normal limits. CK is within normal limits. C-reactive protein and sedimentation rate were within normal limits. TSH is within normal limits. Vitamin B12 is within normal limits. ANA is pending. Acetylcholine receptor antibody pending. [facility] spotted fever and  Lyme disease are pending.  -We'll continue to keep the patient nothing by mouth and if his swallowing progressively improves, will try a few sips/bite of full liquids today.  C2 spinal stenosis with mild myelopathy. The patient's brain MRI revealed moderate spinal stenosis in the upper cervical region-at C2 with resulting in apparent cord flattening. Dr. Merlene Laughter does not believe this is contributing to the patient's neurological symptoms. -Patient does not complain of neck pain or numbness or tingling in his hands. -The patient will need to be monitored in the outpatient setting and possible referral to neurosurgery.  Left thyroid nodule. CT of his head and neck revealed a 1.6 cm thyroid nodule. I do not believe this is contributing to his dysphagia. -Patient's TSH is within normal limits. -Patient will need an outpatient biopsy.  Hypertension. Patient's blood pressures moderately  elevated. He is treated with lisinopril chronically; currently on hold. -We'll treat his hypertension with when necessary hydralazine IV.  Recent tick bite. Lyme disease DNA PCR and [place] spotted fever labs pending.     Time spent: 35 minutes    [facility] Pager [phone]. If 7PM-7AM, please contact night-coverage at [email or web address], password [facility] 11/12/2014, 10:17 AM  LOS: 2 days

## 2014-11-12 NOTE — Progress Notes (Signed)
Lab collection pending for Lyme Disease PCR. Called lab to ask how this is collected and if nursing can collect. Lab stated it is a blood draw and that they would send a phlebotomist up to collect the lab draw.

## 2014-11-13 LAB — HOMOCYSTEINE: Homocysteine: 12.8 umol/L (ref 0.0–15.0)

## 2014-11-13 MED ORDER — KCL IN DEXTROSE-NACL 40-5-0.9 MEQ/L-%-% IV SOLN
INTRAVENOUS | Status: DC
  Administered 2014-11-13 – 2014-11-15 (×4): via INTRAVENOUS
  Filled 2014-11-13 (×7): qty 1000

## 2014-11-13 NOTE — Progress Notes (Addendum)
  Assessment/Plan: ADMITTED WITH DYSPHAGIA/DYSARTHRIA DUE TO A NEUROMUSCULAR DISORDER. CLINICALLY IMPROVED IN IVIG. TOLERATES ICE CREAM AND MAGIC CUP BUT NOT THIN LIQUIDS.  PLAN: 1. SWALLOWING EVAL NEXT WEEK 2. CONSIDER PEG-DISCUSSED PROCEDURE, MANAGEMENT, BENEFITS, & RISKS: < 1% chance of medication reaction, PERITONITIS, OR bleeding WITH PTS' CHILDREN AND PT. 3. TRIAL PUDDING AND MAY CONTINUE MAGIC CUP AND ICE CREAM AS TOLERATED. AVOID THINK LIQUIDS. 4. WILL DISCUSS EXPECTED RECOVERY WITH DR. DOONQUAH MON SEP 19. NPO AFTER MN EXCEPT MED. IF PEG NEEDED WILL PLACE MON SEP 19.  GREATER THAN 50% WAS SPENT IN COUNSELING & COORDINATION OF CARE WITH THE PATIENT AND HIS FAMILY: DISCUSSED DIFFERENTIAL DIAGNOSIS, PROCEDURE, BENEFITS, RISKS, AND MANAGEMENT OF DYSPHAGIA. TOTAL ENCOUNTER TIME: 25 MINS.   Subjective: Since I last evaluated the patient HE TOLERATES HIS SECRETIONS, ICE CREAM AND MAGIC UP BUT NOT THIN LIQUIDS. FAMILY HAS QUESTIONS AND CONCERNS ABOUT HIS PROGNOSIS.  Objective: Vital signs in last 24 hours: Filed Vitals:   11/13/14 1000  BP: 151/98  Pulse: 71  Temp:   Resp: 21     General appearance: alert, cooperative and no distress Resp: clear to auscultation bilaterally Cardio: regular rate and rhythm GI: soft, non-tender; bowel sounds normal; no masses,  no organomegaly  Lab Results:  Cr .84 Hb 13.2   Studies/Results: No results found.  Medications: I have reviewed the patient's current medications.   LOS: 5 days   [name] 08/05/2013, 2:23 PM

## 2014-11-13 NOTE — Progress Notes (Signed)
[facility] PROGRESS NOTE  Duane Burns FXT:[id number] DOB: 06/23/1935 DOA: 11/10/2014 PCP: [name], MD    Code Status: Full code Family Communication: Discussed with wife. Disposition Plan: Discharge when clinically appropriate.   Consultants:  Gastroenterology  Neurology   Procedures:  None  Antibiotics:  None  HPI/Subjective: Patient is able to swallow his secretions with less difficulty. He was tried on a full liquid diet yesterday, but could not tolerate the soup. He did tolerate ice cream and the Magic cup. Otherwise, he has no complaints.  Objective: Filed Vitals:   11/13/14 0802  BP:   Pulse:   Temp: 98.7 F (37.1 C)  Resp:    temperature 98.7. Pulse 66. Respiratory rate 24. Blood pressure 153/81. Oxygen saturation 96%.  Intake/Output Summary (Last 24 hours) at 11/13/14 0824 Last data filed at 11/13/14 0802  Gross per 24 hour  Intake   2235 ml  Output   1275 ml  Net    960 ml   Filed Weights   11/11/14 2229 11/12/14 0630 11/13/14 0500  Weight: 83.4 kg (183 lb 13.8 oz) 83.8 kg (184 lb 11.9 oz) 84.1 kg (185 lb 6.5 oz)    Exam:   General:  Alert 79 year old man in no acute distress. He looks better compared to yesterday.  Oropharynx: Mucous membranes are moist. No posterior pharyngeal exudates or erythema. No posterior pharyngeal edema noted. His soft palate appears to be elevating much better.  Neck: Supple. No adenopathy, no thyromegaly, no masses palpated.  Cardiovascular: S1, S2, with no murmurs rubs or gallops.  Respiratory: Rare crackles in the bases. Breathing nonlabored. Resolution of upper airway crackles.  Abdomen: Positive bowel sounds, soft, nontender, nondistended.  Musculoskeletal/extremities: No pedal edema.  Neurologic: He is alert and oriented 3.  There is mild left eye ptosis.  Data Reviewed: Basic Metabolic Panel:  Recent Labs Lab 11/10/14 1621 11/10/14 1627 11/12/14 0424  NA 139 143 139  K  3.9 3.9 3.4*  CL 106 104 107  CO2 26  --  27  GLUCOSE 130* 129* 118*  BUN 25* 23* 17  CREATININE 0.97 1.00 0.84  CALCIUM 9.5  --  8.2*   Liver Function Tests:  Recent Labs Lab 11/10/14 1621  AST 21  ALT 19  ALKPHOS 73  BILITOT 0.6  PROT 7.6  ALBUMIN 4.4   No results for input(s): LIPASE, AMYLASE in the last 168 hours. No results for input(s): AMMONIA in the last 168 hours. CBC:  Recent Labs Lab 11/10/14 1621 11/10/14 1627 11/12/14 0424  WBC 13.7*  --  10.1  NEUTROABS 11.8*  --   --   HGB 14.4 15.0 13.2  HCT 42.5 44.0 40.3  MCV 88.0  --  89.4  PLT 270  --  221   Cardiac Enzymes:  Recent Labs Lab 11/11/14 1730  CKTOTAL 82  CKMB 4.4   BNP (last 3 results) No results for input(s): BNP in the last 8760 hours.  ProBNP (last 3 results) No results for input(s): PROBNP in the last 8760 hours.  CBG: No results for input(s): GLUCAP in the last 168 hours.  Recent Results (from the past 240 hour(s))  MRSA PCR Screening     Status: None   Collection Time: 11/11/14 11:39 AM  Result Value Ref Range Status   MRSA by PCR NEGATIVE NEGATIVE Final    Comment:        The GeneXpert MRSA Assay (FDA approved for NASAL specimens only), is one component of a comprehensive MRSA  colonization surveillance program. It is not intended to diagnose MRSA infection nor to guide or monitor treatment for MRSA infections.      Studies: Dg Chest Port 1 View  11/11/2014   CLINICAL DATA:  Acute onset of shortness of breath and respiratory distress today which required moving the patient into the intensive care unit.  EXAM: PORTABLE CHEST - 1 VIEW  COMPARISON:  11/10/2014 and earlier.  FINDINGS: Suboptimal inspiration. Cardiac silhouette mildly enlarged for technique and degree of inspiration, unchanged. Chronic elevation of the right hemidiaphragm and chronic scar/atelectasis at the right lung base, unchanged. Lungs otherwise clear. Pulmonary vascularity normal. No visible pleural  effusions.  IMPRESSION: Suboptimal inspiration.  No acute cardiopulmonary disease.   Electronically Signed   By: Evangeline Dakin M.D.   On: 11/11/2014 12:29    Scheduled Meds: . acetylcysteine  3 mL Nebulization Q12H  . albuterol  2.5 mg Nebulization Q12H  . heparin  5,000 Units Subcutaneous Q8H  . IMMUNE GLOBULIN 10% (HUMAN) IV - For Fluid Restriction Only  400 mg/kg Intravenous Q24H  . Influenza vac split quadrivalent PF  0.5 mL Intramuscular Tomorrow-1000  . pantoprazole (PROTONIX) IV  40 mg Intravenous Q24H  . sodium chloride  3 mL Intravenous Q12H   Continuous Infusions: . dextrose 5 % and 0.9 % NaCl with KCl 40 mEq/L     Assessment and plan: Principal Problem:   Dysphagia Active Problems:   Myasthenia gravis   Dysphonia   Left thyroid nodule   SOB (shortness of breath)   Cervical myelopathy   Obese   History of left foot drop   Essential hypertension    1. Dysphagia with dysphonia; secondary to myasthenia gravis versus pharyngeal variant of Guillain-Barr syndrome. Per the patient's history, he was recently treated with a short prednisone taper and amoxicillin for what was thought to be acute bronchitis a few days ago. When he did not improve and was having difficulty swallowing, he was referred to ENT, Dr. Benjamine Mola. Apparently, Dr. Benjamine Mola sent the patient to the ED because his posterior pharynx was not moving normally. I discussed the patient with Dr. Benjamine Mola on 9/16 and he reported that the patient's soft palate was not elevating properly and he was concerned about an acute stroke; otherwise, he stated there was no obvious anatomical abnormality on his exam. -MRI of the patient's brain revealed no acute infarcts, but remote small cerebellar infarcts. -CT of his head and neck revealed a 1.6 cm left thyroid nodule. It also revealed C2 spinal stenosis. -Because of symptomatic shortness of breath and difficulty clearing his secretions, he was moved to the stepdown unit on 9/16. He was  started on albuterol and Mucomyst nebulizers empirically. His follow-up chest x-ray was unremarkable. -Neurologist, Dr. Merlene Laughter was consulted. Following his assessment, his differential diagnosis included pharyngeal variant of Guillain-Barr syndrome, myasthenia gravis, and myopathy. However given the weakness of the patient's obicularis oculi muscles, he suspects myasthenia gravis is more likely. -Patient was started on immunoglobulin therapy. -Labs ordered and some are pending, but RPR is within normal limits. CK is within normal limits. C-reactive protein and sedimentation rate were within normal limits. TSH is within normal limits. Vitamin B12 is within normal limits. ANA is pending. Acetylcholine receptor antibody pending. [facility] spotted fever and Lyme disease are pending.  -We'll continue periodic ice cream, pudding, and Magic cup. We'll continue to monitor for advancement in his diet..  C2 spinal stenosis with mild myelopathy. The patient's brain MRI revealed moderate spinal stenosis in the  upper cervical region-at C2 with resulting in apparent cord flattening. Dr. Merlene Laughter does not believe this is contributing to the patient's neurological symptoms. -Patient does not complain of neck pain, weakness, or numbness or tingling in his hands. -The patient will need to be monitored in the outpatient setting and possible referral to neurosurgery.  Left thyroid nodule. CT of his head and neck revealed a 1.6 cm thyroid nodule. I do not believe this is contributing to his dysphagia. -Patient's TSH is within normal limits. -Patient will need an outpatient biopsy.  Hypertension. Patient's blood pressure is mildly to moderately elevated. He is treated with lisinopril chronically; currently on hold. -We'll continue to treat with when necessary hydralazine IV.  Recent tick bite. Lyme disease DNA PCR and [place] spotted fever labs pending.  Mild hypokalemia. We'll add more potassium to the  IV fluids until he is able to take oral potassium chloride comfortably by mouth.     Time spent: 35 minutes    [place] [facility] Pager [phone]. If 7PM-7AM, please contact night-coverage at [email or web address], password [email or web address] 11/13/2014, 8:24 AM  LOS: 3 days

## 2014-11-14 LAB — CBC
HEMATOCRIT: 39.2 % (ref 39.0–52.0)
Hemoglobin: 13.1 g/dL (ref 13.0–17.0)
MCH: 29.4 pg (ref 26.0–34.0)
MCHC: 33.4 g/dL (ref 30.0–36.0)
MCV: 88.1 fL (ref 78.0–100.0)
Platelets: 201 10*3/uL (ref 150–400)
RBC: 4.45 MIL/uL (ref 4.22–5.81)
RDW: 13.9 % (ref 11.5–15.5)
WBC: 8.3 10*3/uL (ref 4.0–10.5)

## 2014-11-14 LAB — BASIC METABOLIC PANEL
Anion gap: 3 — ABNORMAL LOW (ref 5–15)
BUN: 10 mg/dL (ref 6–20)
CHLORIDE: 106 mmol/L (ref 101–111)
CO2: 26 mmol/L (ref 22–32)
Calcium: 8.2 mg/dL — ABNORMAL LOW (ref 8.9–10.3)
Creatinine, Ser: 0.86 mg/dL (ref 0.61–1.24)
Glucose, Bld: 109 mg/dL — ABNORMAL HIGH (ref 65–99)
POTASSIUM: 4.3 mmol/L (ref 3.5–5.1)
Sodium: 135 mmol/L (ref 135–145)

## 2014-11-14 LAB — STRIATED MUSCLE ANTIBODY: ANTI-STRIATION ABS: NEGATIVE

## 2014-11-14 MED ORDER — ALBUTEROL SULFATE (2.5 MG/3ML) 0.083% IN NEBU: 2.5000 mg | INHALATION_SOLUTION | RESPIRATORY_TRACT | Status: DC | PRN

## 2014-11-14 NOTE — Progress Notes (Signed)
[facility] PROGRESS NOTE  Duane Burns:[id number] DOB: 12/26/1935 DOA: 11/10/2014 PCP: [name], MD    Code Status: Full code Family Communication: Discussed with wife. Disposition Plan: Discharge when clinically appropriate.   Consultants:  Gastroenterology  Neurology   Procedures:  None  Antibiotics:  None  HPI/Subjective: Patient says that he is able to swallow much better. He is able to expectorate with more force. He continues to tolerate ice cream pudding and Magic cup without cough or gag. He has no complaints of chest pain, shortness of breath, chest congestion.  Objective: Filed Vitals:   11/14/14 0807  BP:   Pulse:   Temp: 98.2 F (36.8 C)  Resp:     Oxygen saturation 95%. Blood pressure 140/82. Respiratory rate 60.  Intake/Output Summary (Last 24 hours) at 11/14/14 0941 Last data filed at 11/14/14 0644  Gross per 24 hour  Intake 1484.26 ml  Output    900 ml  Net 584.26 ml   Filed Weights   11/13/14 0500 11/13/14 2158 11/14/14 0500  Weight: 84.1 kg (185 lb 6.5 oz) 83 kg (182 lb 15.7 oz) 84.3 kg (185 lb 13.6 oz)    Exam:   General:  Alert 79 year old man in no acute distress. He looks better compared to yesterday.  Oropharynx: Mucous membranes are moist. No posterior pharyngeal exudates or erythema. No posterior pharyngeal edema noted. Significant improvement in soft palate elevation.  Neck: Supple. No adenopathy, no thyromegaly, no masses palpated.  Cardiovascular: S1, S2, with no murmurs rubs or gallops.  Respiratory: Decreased breath sounds in the bases. Breathing nonlabored. Resolution of upper airway crackles.  Abdomen: Positive bowel sounds, soft, nontender, nondistended.  Musculoskeletal/extremities: No pedal edema.  Neurologic: He is alert and oriented 3.  There is mild left eye ptosis. Significant improvement in soft palate elevation.  Data Reviewed: Basic Metabolic Panel:  Recent Labs Lab  11/10/14 1621 11/10/14 1627 11/12/14 0424 11/14/14 0630  NA 139 143 139 135  K 3.9 3.9 3.4* 4.3  CL 106 104 107 106  CO2 26  --  27 26  GLUCOSE 130* 129* 118* 109*  BUN 25* 23* 17 10  CREATININE 0.97 1.00 0.84 0.86  CALCIUM 9.5  --  8.2* 8.2*   Liver Function Tests:  Recent Labs Lab 11/10/14 1621  AST 21  ALT 19  ALKPHOS 73  BILITOT 0.6  PROT 7.6  ALBUMIN 4.4   No results for input(s): LIPASE, AMYLASE in the last 168 hours. No results for input(s): AMMONIA in the last 168 hours. CBC:  Recent Labs Lab 11/10/14 1621 11/10/14 1627 11/12/14 0424 11/14/14 0630  WBC 13.7*  --  10.1 8.3  NEUTROABS 11.8*  --   --   --   HGB 14.4 15.0 13.2 13.1  HCT 42.5 44.0 40.3 39.2  MCV 88.0  --  89.4 88.1  PLT 270  --  221 201   Cardiac Enzymes:  Recent Labs Lab 11/11/14 1730  CKTOTAL 82  CKMB 4.4   BNP (last 3 results) No results for input(s): BNP in the last 8760 hours.  ProBNP (last 3 results) No results for input(s): PROBNP in the last 8760 hours.  CBG: No results for input(s): GLUCAP in the last 168 hours.  Recent Results (from the past 240 hour(s))  MRSA PCR Screening     Status: None   Collection Time: 11/11/14 11:39 AM  Result Value Ref Range Status   MRSA by PCR NEGATIVE NEGATIVE Final    Comment:  The GeneXpert MRSA Assay (FDA approved for NASAL specimens only), is one component of a comprehensive MRSA colonization surveillance program. It is not intended to diagnose MRSA infection nor to guide or monitor treatment for MRSA infections.      Studies: No results found.  Scheduled Meds: . heparin  5,000 Units Subcutaneous Q8H  . IMMUNE GLOBULIN 10% (HUMAN) IV - For Fluid Restriction Only  400 mg/kg Intravenous Q24H  . Influenza vac split quadrivalent PF  0.5 mL Intramuscular Tomorrow-1000  . pantoprazole (PROTONIX) IV  40 mg Intravenous Q24H  . sodium chloride  3 mL Intravenous Q12H   Continuous Infusions: . dextrose 5 % and 0.9 % NaCl  with KCl 40 mEq/L 75 mL/hr at 11/13/14 2333   Assessment and plan: Principal Problem:   Dysphagia Active Problems:   Myasthenia gravis   Dysphonia   Left thyroid nodule   SOB (shortness of breath)   Cervical myelopathy   Obese   History of left foot drop   Essential hypertension    1. Dysphagia with dysphonia; secondary to myasthenia gravis versus pharyngeal variant of Guillain-Barr syndrome. Per the patient's history, he was recently treated with a short prednisone taper and amoxicillin for what was thought to be acute bronchitis a few days ago. When he did not improve and was having difficulty swallowing, he was referred to ENT, Dr. Benjamine Mola. Apparently, Dr. Benjamine Mola sent the patient to the ED because his posterior pharynx was not moving normally. I discussed the patient with Dr. Benjamine Mola on 9/16 and he reported that the patient's soft palate was not elevating properly and he was concerned about an acute stroke; otherwise, he stated there was no obvious anatomical abnormality on his exam. -MRI of the patient's brain revealed no acute infarcts, but remote small cerebellar infarcts. -CT of his head and neck revealed a 1.6 cm left thyroid nodule. It also revealed C2 spinal stenosis. -Because of symptomatic shortness of breath and difficulty clearing his secretions, he was moved to the stepdown unit on 9/16. He was started on albuterol and Mucomyst nebulizers empirically. His follow-up chest x-ray was unremarkable. -Neurologist, Dr. Merlene Laughter was consulted. Following his assessment, his differential diagnosis included pharyngeal variant of Guillain-Barr syndrome, myasthenia gravis, and myopathy. However given the weakness of the patient's obicularis oculi muscles, he suspects myasthenia gravis is more likely. -Patient was started on immunoglobulin therapy. -Labs ordered and some are pending, but RPR is within normal limits. CK is within normal limits. C-reactive protein and sedimentation rate were within  normal limits. TSH is within normal limits. Vitamin B12 is within normal limits. ANA is pending. Acetylcholine receptor antibody pending. [facility] spotted fever and Lyme disease are pending.  -We'll continue periodic ice cream, pudding, and Magic cup. We'll ask speech therapist to evaluate the patient's swallowing and advance diet as tolerated. -Dr. Oneida Alar is considering a PEG tube pending her conversation with Dr. Merlene Laughter. Would await the evaluation by ST. The patient appears to be improving clinically and may not need the PEG tube.  C2 spinal stenosis with mild myelopathy. The patient's brain MRI revealed moderate spinal stenosis in the upper cervical region-at C2 with resulting in apparent cord flattening. Dr. Merlene Laughter does not believe this is contributing to the patient's neurological symptoms. -Patient does not complain of neck pain, weakness, or numbness or tingling in his hands. -The patient will need to be monitored in the outpatient setting and possible referral to neurosurgery. -Ambulate as tolerated.  Left thyroid nodule. CT of his head and neck  revealed a 1.6 cm thyroid nodule. I do not believe this is contributing to his dysphagia. -Patient's TSH is within normal limits. -Patient will need an outpatient biopsy.  Hypertension. Patient's blood pressure is mildly to moderately elevated. He is treated with lisinopril chronically; currently on hold. -We'll continue to treat with when necessary hydralazine IV.  Recent tick bite. Lyme disease DNA PCR and [place] spotted fever labs pending.  Mild hypokalemia. Potassium was added to the maintenance IV fluids. His serum potassium is better.     Time spent: 30 minutes    [place] [facility] Pager [phone]. If 7PM-7AM, please contact night-coverage at [email or web address], password [facility] 11/14/2014, 9:41 AM  LOS: 4 days

## 2014-11-14 NOTE — Progress Notes (Signed)
Patient ID: Duane Burns, male   DOB: 1935-09-26, 79 y.o.   MRN: [id number]   [place] A. Merlene Laughter, MD     [email or web address]          Duane Burns is an 79 y.o. male.   Assessment/Plan: Acute onset of dysarthria, dysphasia and dyspnea. Pharyngeal variant of Guillain-Barr syndrome versus myasthenia gravis. Given the weakness of the obicularis oculi muscles, I suspect that myasthenia gravis is more likely.   The patient does seem to have mild evidence of myelopathy but this is likely incidental.  Chronic R L5 radiculopathy with the left foot drop status post lumbar surgery.    Recommendations: Blood testing for the following: Acetylcholine receptor antibodies including binding---PENDING To complete 5 days of IV immunoglobulins.   Much improved dysarthria and dysphagia.    GENERAL: This is a very pleasant male in no acute distress. He still has some excessive salivation.  HEENT: Supple. Atraumatic normocephalic. No ptosis. Neck flexion 4+ and neck extension 5. Still has moderate weakness of the obicularis oculi muscles bilaterally.  ABDOMEN: soft  EXTREMITIES: No edema   BACK: Normal.  SKIN: Normal by inspection.   MENTAL STATUS: Alert and oriented. Speech, language and cognition are generally intact. Judgment and insight normal.   CRANIAL NERVES: Pupils are equal, round and reactive to light and accommodation; extra ocular movements are full, there is no significant nystagmus; visual fields are full; upper and lower facial muscles are symmetric, there is no flattening of the nasolabial folds; tongue is midline; uvula is midline; shoulder elevation is normal.  MOTOR: Normal tone, bulk and strength in arms; no pronator drift. Hip flexion is 5 bilaterally. Left dorsiflexion 1/5 and right 5.  COORDINATION: Left finger to nose is normal, right finger to nose is normal, No rest tremor; no intention tremor; no postural tremor; no  bradykinesia.     Objective: Vital signs in last 24 hours: Temp:  [97.4 F (36.3 C)-99 F (37.2 C)] 98.2 F (36.8 C) (09/19 0807) Pulse Rate:  [57-99] 74 (09/19 1100) Resp:  [18-31] 27 (09/19 0800) BP: (133-173)/(63-125) 163/82 mmHg (09/19 1100) SpO2:  [94 %-100 %] 98 % (09/19 1100) Weight:  [83 kg (182 lb 15.7 oz)-84.3 kg (185 lb 13.6 oz)] 84.3 kg (185 lb 13.6 oz) (09/19 0500)  Intake/Output from previous day: 09/18 0701 - 09/19 0700 In: 1484.3 [I.V.:1484.3] Out: 1400 [Urine:1400] Intake/Output this shift:   Nutritional status: DIET - DYS 1 Room service appropriate?: Yes; Fluid consistency:: Nectar Thick   Lab Results: Results for orders placed or performed during the hospital encounter of 11/10/14 (from the past 48 hour(s))  CBC     Status: None   Collection Time: 11/14/14  6:30 AM  Result Value Ref Range   WBC 8.3 4.0 - 10.5 K/uL   RBC 4.45 4.22 - 5.81 MIL/uL   Hemoglobin 13.1 13.0 - 17.0 g/dL   HCT 39.2 39.0 - 52.0 %   MCV 88.1 78.0 - 100.0 fL   MCH 29.4 26.0 - 34.0 pg   MCHC 33.4 30.0 - 36.0 g/dL   RDW 13.9 11.5 - 15.5 %   Platelets 201 150 - 400 K/uL  Basic metabolic panel     Status: Abnormal   Collection Time: 11/14/14  6:30 AM  Result Value Ref Range   Sodium 135 135 - 145 mmol/L   Potassium 4.3 3.5 - 5.1 mmol/L   Chloride 106 101 - 111 mmol/L   CO2 26 22 - 32 mmol/L  Glucose, Bld 109 (H) 65 - 99 mg/dL   BUN 10 6 - 20 mg/dL   Creatinine, Ser 0.86 0.61 - 1.24 mg/dL   Calcium 8.2 (L) 8.9 - 10.3 mg/dL   GFR calc non Af Amer >60 >60 mL/min   GFR calc Af Amer >60 >60 mL/min    Comment: (NOTE) The eGFR has been calculated using the CKD EPI equation. This calculation has not been validated in all clinical situations. eGFR's persistently <60 mL/min signify possible Chronic Kidney Disease.    Anion gap 3 (L) 5 - 15    Lipid Panel No results for input(s): CHOL, TRIG, HDL, CHOLHDL, VLDL, LDLCALC in the last 72 hours.  Studies/Results:   Medications:   Scheduled Meds: . heparin  5,000 Units Subcutaneous Q8H  . IMMUNE GLOBULIN 10% (HUMAN) IV - For Fluid Restriction Only  400 mg/kg Intravenous Q24H  . Influenza vac split quadrivalent PF  0.5 mL Intramuscular Tomorrow-1000  . pantoprazole (PROTONIX) IV  40 mg Intravenous Q24H  . sodium chloride  3 mL Intravenous Q12H   Continuous Infusions: . dextrose 5 % and 0.9 % NaCl with KCl 40 mEq/L 75 mL/hr at 11/14/14 1253   PRN Meds:.albuterol, hydrALAZINE, ondansetron **OR** ondansetron (ZOFRAN) IV     LOS: 4 days   Kofi A. Merlene Laughter, M.D.  Diplomate, Tax adviser of Psychiatry and Neurology ( Neurology).

## 2014-11-14 NOTE — Progress Notes (Signed)
    Subjective: Able to tolerate apple juice and ice cream yesterday but unable to tolerate pudding. No N/V. States he feels he is able to "spit up" better now. Phlegm. No overt GI bleeding.   Objective: Vital signs in last 24 hours: Temp:  [97.4 F (36.3 C)-99 F (37.2 C)] 98.2 F (36.8 C) (09/19 0807) Pulse Rate:  [57-101] 60 (09/19 0500) Resp:  [18-29] 21 (09/19 0500) BP: (127-161)/(63-125) 140/82 mmHg (09/19 0500) SpO2:  [82 %-100 %] 95 % (09/19 0500) Weight:  [182 lb 15.7 oz (83 kg)-185 lb 13.6 oz (84.3 kg)] 185 lb 13.6 oz (84.3 kg) (09/19 0500) Last BM Date: 11/10/14 General:   Alert and oriented, pleasant Head:  Normocephalic and atraumatic. Heart:  S1, S2 present, no murmurs noted.  Lungs: Clear to auscultation bilaterally Abdomen:  Bowel sounds present, soft, non-tender, non-distended. No HSM or hernias noted. No rebound or guarding. No masses appreciated  Extremities:  Without  edema. Neurologic:  Alert and  oriented x4 Psych:  Alert and cooperative. Normal mood and affect.  Intake/Output from previous day: 09/18 0701 - 09/19 0700 In: 1484.3 [I.V.:1484.3] Out: 1400 [Urine:1400] Intake/Output this shift:    Lab Results:  Recent Labs  11/12/14 0424 11/14/14 0630  WBC 10.1 8.3  HGB 13.2 13.1  HCT 40.3 39.2  PLT 221 201   BMET  Recent Labs  11/12/14 0424 11/14/14 0630  NA 139 135  K 3.4* 4.3  CL 107 106  CO2 27 26  GLUCOSE 118* 109*  BUN 17 10  CREATININE 0.84 0.86  CALCIUM 8.2* 8.2*    Assessment: 79 year old male admitted with acute dysphagia/dysphonia likely secondary to neuromuscular disorder. Clinically has improved somewhat but unable to tolerate pudding yesterday. To discuss with Dr. Merlene Laughter today the need for PEG tube placement. Remain NPO for now.   Plan: NPO Dr. Oneida Alar to discuss with Dr. Merlene Laughter need for PEG Continue supportive measures Heparin dose held this morning per nursing due to possible procedure  Orvil Feil,  ANP-BC [facility]    LOS: 4 days    11/14/2014, 10:15 AM

## 2014-11-14 NOTE — Evaluation (Signed)
Clinical/Bedside Swallow Evaluation Patient Details  Name: Duane Burns MRN: [id number] Date of Birth: 1935/03/20  Today's Date: 11/14/2014 Time: SLP Start Time (ACUTE ONLY): 1000 SLP Stop Time (ACUTE ONLY): 1036 SLP Time Calculation (min) (ACUTE ONLY): 36 min  Past Medical History:  Past Medical History  Diagnosis Date  . Hyperlipidemia   . Hypertension   . Foot drop, left     DUE TO NERVE INJURY IN BACK PER PT  . Pneumonia     JAN 2016  . Arthritis   . HNP (herniated nucleus pulposus with myelopathy), thoracic     HISTORY OF HNP  . Cancer     History of skin cancer / hx Prostate cancer  . Urgency of urination   . Urinary leakage    Past Surgical History:  Past Surgical History  Procedure Laterality Date  . Prostate surgery    . Back surgery      X 3  . Skin cancer excision    . Tonsillectomy    . Total hip arthroplasty Right 05/10/2014    Procedure: RIGHT TOTAL HIP ARTHROPLASTY ANTERIOR APPROACH ;  Surgeon: Paralee Cancel, MD;  Location: [facility];  Service: Orthopedics;  Laterality: Right;  . Cystoscopy with urethral dilatation N/A 05/10/2014    Procedure: CYSTOSCOPY WITH URETHRAL DILATATION;  Surgeon: Alexis Frock, MD;  Location: [facility];  Service: Urology;  Laterality: N/A;  with catheter placement, prior to surgery. procedure performed on the stretcher.   HPI:  Duane Burns is a 79 yo male who woke up over a week ago with acute onset of dysarthria and dysphagia. Patient's symptoms persisted and he decided to seek medical attention for couple days. He was treated with steroids and amoxicillin by his primary care provider [facility]) but the symptoms in fact deteriorated. He saw Dr. Benjamine Mola whose evaluation revealed that one of his vocal cords was apparently not working properly. The patient was admitted for further evaluation given the risk of aspiration. He was transferred to the unit because of some dyspnea associated with excessive salivation. Patient had to be suctioned  vigorously. Dr. Merlene Laughter questions possible neuromuscular causes (? pharyngeal variant of GBS or myasthenia gravis). He was started on immunoglobulin with good results. Pt reports improved swallowing abilities. Dr. Caryn Section ordered BSE to assist with diet recommendations and compensatory strategies as needed.    Assessment / Plan / Recommendation Clinical Impression  Duane Burns reports improved ability to tolerate secretions and improved speech. Oral motor examination completed and shows mild weakness (unable to fully close eyes and labial weakness), decreased hyolaryngeal excursion, and mildly weak cough. Pt eager to try po. Ice chips presented without incident; thin water trials yielded reduced hyolaryngeal excursion and immediate throat clearing; improved performance with puree (4 oz) and NTL (4 oz). Pt showed mild fatigue after about 20 minutes. SLP discussed recommendations (D1/puree with NTL), compensatory strategies (effortful swallow, slow rate) and signs/symptoms of aspiration to look out for with pt and wife. Do not feel objective study indicated at this time due to improvement noted from yesterday. Would like to start him on diet (D1 and NTL) and continue to monitor while in unit if possible and check on him tomorrow for diet toleration, upgrades, and assess need for objective testing. Above to pt/family, RN, and Dr. Caryn Section. Prognosis for diet upgrade is good with continued improvement.    Aspiration Risk  Mild    Diet Recommendation Dysphagia 1 (Puree);Nectar   Medication Administration: Whole meds with liquid (or continue via IV)  Compensations: Multiple dry swallows after each bite/sip;Effortful swallow;Slow rate    Other  Recommendations Oral Care Recommendations: Oral care BID Other Recommendations: Order thickener from pharmacy;Prohibited food (jello, ice cream, thin soups);Clarify dietary restrictions   Follow Up Recommendations       Frequency and Duration min 2x/week  1 week    Pertinent Vitals/Pain VSS     SLP Swallow Goals   Pt will demonstrate safe and efficient consumption of least restrictive diet with use of strategies as needed.    Swallow Study Prior Functional Status       General Date of Onset: 11/10/14 Other Pertinent Information: Duane Burns is a 79 yo male who woke up over a week ago with acute onset of dysarthria and dysphagia. Patient's symptoms persisted and he decided to seek medical attention for couple days. He was treated with steroids and amoxicillin by his primary care provider [facility]) but the symptoms in fact deteriorated. He saw Dr. Benjamine Mola whose evaluation revealed that one of his vocal cords was apparently not working properly. The patient was admitted for further evaluation given the risk of aspiration. He was transferred to the unit because of some dyspnea associated with excessive salivation. Patient had to be suctioned vigorously. Dr. Merlene Laughter questions possible neuromuscular causes (? pharyngeal variant of GBS or myasthenia gravis). He was started on immunoglobulin with good results. Pt reports improved swallowing abilities. Dr. Caryn Section ordered BSE to assist with diet recommendations and compensatory strategies as needed.  Type of Study: Bedside swallow evaluation Previous Swallow Assessment: N/A Diet Prior to this Study: NPO Temperature Spikes Noted: No Respiratory Status: Room air History of Recent Intubation: No Behavior/Cognition: Alert;Cooperative;Pleasant mood Oral Cavity - Dentition: Adequate natural dentition/normal for age Self-Feeding Abilities: Able to feed self Patient Positioning: Upright in bed Baseline Vocal Quality: Normal Volitional Cough: Weak Volitional Swallow: Able to elicit    Oral/Motor/Sensory Function Overall Oral Motor/Sensory Function: Impaired Labial ROM: Within Functional Limits Labial Symmetry: Within Functional Limits Labial Strength: Reduced Labial Sensation: Within Functional  Limits Lingual ROM: Within Functional Limits Lingual Symmetry: Within Functional Limits Lingual Strength: Within Functional Limits Lingual Sensation: Within Functional Limits Facial ROM: Within Functional Limits Facial Symmetry: Left drooping eyelid Facial Strength: Reduced Facial Sensation: Within Functional Limits Velum: Within Functional Limits Mandible: Within Functional Limits   Ice Chips Ice chips: Within functional limits Presentation: Spoon Other Comments: reduced hyolaryngeal excursion   Thin Liquid Thin Liquid: Impaired Presentation: Cup;Self Fed;Spoon Pharyngeal  Phase Impairments: Throat Clearing - Immediate;Multiple swallows;Decreased hyoid-laryngeal movement    Nectar Thick Nectar Thick Liquid: Impaired Presentation: Self Fed;Cup Pharyngeal Phase Impairments: Decreased hyoid-laryngeal movement   Honey Thick Honey Thick Liquid: Not tested   Puree Puree: Impaired Presentation: Spoon;Self Fed Pharyngeal Phase Impairments: Decreased hyoid-laryngeal movement   Solid   Thank you,  Genene Churn, CCC-SLP [phone]     Solid: Not tested Other Comments: deferred this date due to slow improvements noted; will try tomorrow if tolerates puree       PORTER,DABNEY 11/14/2014,11:23 AM

## 2014-11-14 NOTE — Progress Notes (Signed)
Spoke with lab [facility]) regarding patient's lab order for lyme disease dna pcr ordered via urine. Lab called back to confirm that it's tested via blood (not urine) and would test the prior blood sample that was obtained earlier today. Results pending at this time.

## 2014-11-15 DIAGNOSIS — G7000 Myasthenia gravis without (acute) exacerbation: Secondary | ICD-10-CM | POA: Diagnosis not present

## 2014-11-15 LAB — ROCKY MTN SPOTTED FVR ABS PNL(IGG+IGM)
RMSF IGG: POSITIVE — AB
RMSF IGM: 0.34 {index} (ref 0.00–0.89)

## 2014-11-15 LAB — RMSF, IGG, IFA: RMSF, IGG, IFA: 1:128 {titer} — ABNORMAL HIGH

## 2014-11-15 LAB — ANTINUCLEAR ANTIBODIES, IFA: ANTINUCLEAR ANTIBODIES, IFA: NEGATIVE

## 2014-11-15 MED ORDER — LISINOPRIL 10 MG PO TABS
20.0000 mg | ORAL_TABLET | Freq: Every day | ORAL | Status: DC
  Administered 2014-11-15 – 2014-11-16 (×2): 20 mg via ORAL
  Filled 2014-11-15 (×2): qty 2

## 2014-11-15 NOTE — Progress Notes (Signed)
Patient ID: Duane Burns, male   DOB: 10/28/1935, 80 y.o.   MRN: [id number]   [place] A. Merlene Laughter, MD     [email or web address]          Duane Burns is an 79 y.o. male.   Assessment/Plan: Acute onset of dysarthria, dysphasia and dyspnea. Pharyngeal variant of Guillain-Barr syndrome versus myasthenia gravis. Given the weakness of the obicularis oculi muscles, I suspect that myasthenia gravis is more likely.   The patient does seem to have mild evidence of myelopathy but this is likely incidental.  Chronic R L5 radiculopathy with the left foot drop status post lumbar surgery.    Recommendations: Blood testing for the following: Acetylcholine receptor antibodies including binding---PENDING To complete 5 days of IV immunoglobulins.  The patient's swallowing continues to improve. His meal consistency will be increased as recommended by the speech therapist. He is getting his last dose of IV immunoglobulins tomorrow and should be well to be discharged home. He can follow up in the office in 4 weeks.     GENERAL: This is a very pleasant male in no acute distress. He still has some excessive salivation.  HEENT: Supple. Atraumatic normocephalic. No ptosis. Neck flexion 4+ and neck extension 5. Still has moderate weakness of the obicularis oculi muscles bilaterally.  ABDOMEN: soft  EXTREMITIES: No edema   BACK: Normal.  SKIN: Normal by inspection.   MENTAL STATUS: Alert and oriented. Speech, language and cognition are generally intact. Judgment and insight normal.   CRANIAL NERVES: Pupils are equal, round and reactive to light and accommodation; extra ocular movements are full, there is no significant nystagmus; visual fields are full; upper and lower facial muscles are symmetric, there is no flattening of the nasolabial folds; tongue is midline; uvula is midline; shoulder elevation is normal.  MOTOR: Normal tone, bulk and strength in arms; no pronator  drift. Hip flexion is 5 bilaterally. Left dorsiflexion 1/5 and right 5.  COORDINATION: Left finger to nose is normal, right finger to nose is normal, No rest tremor; no intention tremor; no postural tremor; no bradykinesia.     Objective: Vital signs in last 24 hours: Temp:  [97.8 F (36.6 C)-99.1 F (37.3 C)] 99.1 F (37.3 C) (09/20 1334) Pulse Rate:  [71-92] 80 (09/20 1334) Resp:  [20-23] 21 (09/20 1334) BP: (136-191)/(53-89) 136/65 mmHg (09/20 1334) SpO2:  [94 %-100 %] 94 % (09/20 1334)  Intake/Output from previous day: 09/19 0701 - 09/20 0700 In: -  Out: 3244 [Urine:1420] Intake/Output this shift: Total I/O In: 180 [P.O.:180] Out: 425 [Urine:425] Nutritional status: DIET DYS 3 Room service appropriate?: Yes; Fluid consistency:: Nectar Thick   Lab Results: Results for orders placed or performed during the hospital encounter of 11/10/14 (from the past 48 hour(s))  CBC     Status: None   Collection Time: 11/14/14  6:30 AM  Result Value Ref Range   WBC 8.3 4.0 - 10.5 K/uL   RBC 4.45 4.22 - 5.81 MIL/uL   Hemoglobin 13.1 13.0 - 17.0 g/dL   HCT 39.2 39.0 - 52.0 %   MCV 88.1 78.0 - 100.0 fL   MCH 29.4 26.0 - 34.0 pg   MCHC 33.4 30.0 - 36.0 g/dL   RDW 13.9 11.5 - 15.5 %   Platelets 201 150 - 400 K/uL  Basic metabolic panel     Status: Abnormal   Collection Time: 11/14/14  6:30 AM  Result Value Ref Range   Sodium 135 135 - 145 mmol/L  Potassium 4.3 3.5 - 5.1 mmol/L   Chloride 106 101 - 111 mmol/L   CO2 26 22 - 32 mmol/L   Glucose, Bld 109 (H) 65 - 99 mg/dL   BUN 10 6 - 20 mg/dL   Creatinine, Ser 0.86 0.61 - 1.24 mg/dL   Calcium 8.2 (L) 8.9 - 10.3 mg/dL   GFR calc non Af Amer >60 >60 mL/min   GFR calc Af Amer >60 >60 mL/min    Comment: (NOTE) The eGFR has been calculated using the CKD EPI equation. This calculation has not been validated in all clinical situations. eGFR's persistently <60 mL/min signify possible Chronic Kidney Disease.    Anion gap 3 (L) 5 - 15     Lipid Panel No results for input(s): CHOL, TRIG, HDL, CHOLHDL, VLDL, LDLCALC in the last 72 hours.  Studies/Results:   Medications:  Scheduled Meds: . heparin  5,000 Units Subcutaneous Q8H  . IMMUNE GLOBULIN 10% (HUMAN) IV - For Fluid Restriction Only  400 mg/kg Intravenous Q24H  . lisinopril  20 mg Oral Daily  . pantoprazole (PROTONIX) IV  40 mg Intravenous Q24H  . sodium chloride  3 mL Intravenous Q12H   Continuous Infusions: . dextrose 5 % and 0.9 % NaCl with KCl 40 mEq/L 30 mL/hr at 11/15/14 1047   PRN Meds:.albuterol, hydrALAZINE, ondansetron **OR** ondansetron (ZOFRAN) IV     LOS: 5 days   Kofi A. Merlene Laughter, M.D.  Diplomate, Tax adviser of Psychiatry and Neurology ( Neurology).

## 2014-11-15 NOTE — Progress Notes (Signed)
Patient swallowed PO meds whole in applesauce and nectar thickened liquid without difficulty. No choking or coughing noted.

## 2014-11-15 NOTE — Progress Notes (Signed)
[facility] PROGRESS NOTE  ROMEN YUTZY SUP:[id number] DOB: 1935-08-04 DOA: 11/10/2014 PCP: [name], MD    Code Status: Full code Family Communication: Discussed with wife. Disposition Plan: Discharge when clinically appropriate.   Consultants:  Gastroenterology  Neurology   Procedures:  None  Antibiotics:  None  HPI/Subjective: Patient is tolerating pure diet with nectar thickened liquids started yesterday. He is enjoying it. He has no complaints of chest congestion or chest pain. His swallowing is improving daily per him.   Objective: Filed Vitals:   11/15/14 0537  BP: 182/53  Pulse: 72  Temp: 97.8 F (36.6 C)  Resp: 22    Oxygen saturation 100%  Intake/Output Summary (Last 24 hours) at 11/15/14 1229 Last data filed at 11/15/14 1052  Gross per 24 hour  Intake    120 ml  Output   1845 ml  Net  -1725 ml   Filed Weights   11/13/14 0500 11/13/14 2158 11/14/14 0500  Weight: 84.1 kg (185 lb 6.5 oz) 83 kg (182 lb 15.7 oz) 84.3 kg (185 lb 13.6 oz)    Exam:   General:  Alert 79 year old man in no acute distress.   Neck: Supple. No adenopathy, no thyromegaly, no masses palpated.  Cardiovascular: S1, S2, with no murmurs rubs or gallops.  Respiratory: Decreased breath sounds in the bases. Breathing nonlabored.   Abdomen: Positive bowel sounds, soft, nontender, nondistended.  Musculoskeletal/extremities: No pedal edema.  Neurologic: He is alert and oriented 3.  There is mild left eye ptosis. Significant improvement in soft palate elevation.  Data Reviewed: Basic Metabolic Panel:  Recent Labs Lab 11/10/14 1621 11/10/14 1627 11/12/14 0424 11/14/14 0630  NA 139 143 139 135  K 3.9 3.9 3.4* 4.3  CL 106 104 107 106  CO2 26  --  27 26  GLUCOSE 130* 129* 118* 109*  BUN 25* 23* 17 10  CREATININE 0.97 1.00 0.84 0.86  CALCIUM 9.5  --  8.2* 8.2*   Liver Function Tests:  Recent Labs Lab 11/10/14 1621  AST 21  ALT 19   ALKPHOS 73  BILITOT 0.6  PROT 7.6  ALBUMIN 4.4   No results for input(s): LIPASE, AMYLASE in the last 168 hours. No results for input(s): AMMONIA in the last 168 hours. CBC:  Recent Labs Lab 11/10/14 1621 11/10/14 1627 11/12/14 0424 11/14/14 0630  WBC 13.7*  --  10.1 8.3  NEUTROABS 11.8*  --   --   --   HGB 14.4 15.0 13.2 13.1  HCT 42.5 44.0 40.3 39.2  MCV 88.0  --  89.4 88.1  PLT 270  --  221 201   Cardiac Enzymes:  Recent Labs Lab 11/11/14 1730  CKTOTAL 82  CKMB 4.4   BNP (last 3 results) No results for input(s): BNP in the last 8760 hours.  ProBNP (last 3 results) No results for input(s): PROBNP in the last 8760 hours.  CBG: No results for input(s): GLUCAP in the last 168 hours.  Recent Results (from the past 240 hour(s))  MRSA PCR Screening     Status: None   Collection Time: 11/11/14 11:39 AM  Result Value Ref Range Status   MRSA by PCR NEGATIVE NEGATIVE Final    Comment:        The GeneXpert MRSA Assay (FDA approved for NASAL specimens only), is one component of a comprehensive MRSA colonization surveillance program. It is not intended to diagnose MRSA infection nor to guide or monitor treatment for MRSA infections.  Studies: No results found.  Scheduled Meds: . heparin  5,000 Units Subcutaneous Q8H  . IMMUNE GLOBULIN 10% (HUMAN) IV - For Fluid Restriction Only  400 mg/kg Intravenous Q24H  . lisinopril  20 mg Oral Daily  . pantoprazole (PROTONIX) IV  40 mg Intravenous Q24H  . sodium chloride  3 mL Intravenous Q12H   Continuous Infusions: . dextrose 5 % and 0.9 % NaCl with KCl 40 mEq/L 30 mL/hr at 11/15/14 1047   Assessment and plan: Principal Problem:   Dysphagia Active Problems:   Myasthenia gravis   Dysphonia   Left thyroid nodule   SOB (shortness of breath)   Cervical myelopathy   Obese   History of left foot drop   Essential hypertension    1. Dysphagia with dysphonia; secondary to myasthenia gravis versus pharyngeal  variant of Guillain-Barr syndrome. Per the patient's history, he was recently treated with a short prednisone taper and amoxicillin for what was thought to be acute bronchitis a few days ago. When he did not improve and was having difficulty swallowing, he was referred to ENT, Dr. Benjamine Mola. Apparently, Dr. Benjamine Mola sent the patient to the ED because his posterior pharynx was not moving normally. I discussed the patient with Dr. Benjamine Mola on 9/16 and he reported that the patient's soft palate was not elevating properly and he was concerned about an acute stroke; otherwise, he stated there was no obvious anatomical abnormality on his exam. -MRI of the patient's brain revealed no acute infarcts, but remote small cerebellar infarcts. -CT of his head and neck revealed a 1.6 cm left thyroid nodule. It also revealed C2 spinal stenosis. -Because of symptomatic shortness of breath and difficulty clearing his secretions, he was moved to the stepdown unit on 9/16. He was started on albuterol and Mucomyst nebulizers empirically. His follow-up chest x-ray was unremarkable. -Neurologist, Dr. Merlene Laughter was consulted. Following his assessment, his differential diagnosis included pharyngeal variant of Guillain-Barr syndrome, myasthenia gravis, and myopathy. However given the weakness of the patient's obicularis oculi muscles, he suspects myasthenia gravis is more likely. -Patient was started on immunoglobulin therapy. He will need a total of 5 days. -Labs studies ordered per Dr. Merlene Laughter: RPR is within normal limits. CK is within normal limits. C-reactive protein and sedimentation rate were within normal limits. TSH is within normal limits. Vitamin B12 is within normal limits.  -ANA is pending. Acetylcholine receptor antibody pending. [facility] spotted fever and Lyme disease are still pending. -Patient evaluated by the speech therapist on 11/14/14 and recommended a pured diet with nectar thickened liquids. He appears to be tolerating  it well. -PEG tube was considered by GI, but I believe it is no longer needed as the patient is tolerating pured diet. -Nebulizers were discontinued as the patient is medically improving.   C2 spinal stenosis with mild myelopathy. The patient's brain MRI revealed moderate spinal stenosis in the upper cervical region-at C2 with resulting in apparent cord flattening. Dr. Merlene Laughter does not believe this is contributing to the patient's neurological symptoms. -Patient does not complain of neck pain, weakness, or numbness or tingling in his hands. -The patient will need to be monitored in the outpatient setting and possible referral to neurosurgery. -Ambulate as tolerated.  Chronic foot drop secondary to chronic L5 radiculopathy, status post lumbar surgery.  Left thyroid nodule. CT of his head and neck revealed a 1.6 cm thyroid nodule. I do not believe this is contributing to his dysphagia. -Patient's TSH is within normal limits. -Patient will need an  outpatient biopsy.  Hypertension. Patient is treated chronically with lisinopril. It had been on hold because he had been nothing by mouth. When necessary hydralazine was ordered. His blood pressure has trended up. So we'll restart oral lisinopril as tolerated. Will continue IV hydralazine when necessary. Will decrease IV fluid rate which may be contribute into elevated blood pressure.  Recent tick bite. Lyme disease DNA PCR and [place] spotted fever labs pending.  Mild hypokalemia. Potassium was added to the maintenance IV fluids. His serum potassium is better.     Time spent: 30 minutes    [facility] Pager [phone]. If 7PM-7AM, please contact night-coverage at [email or web address], password [facility] 11/15/2014, 12:29 PM  LOS: 5 days

## 2014-11-15 NOTE — Care Management Note (Signed)
Case Management Note  Patient Details  Name: Duane Burns MRN: [id number] Date of Birth: 1935-11-02   Expected Discharge Date:                  Expected Discharge Plan:  Home/Self Care  In-House Referral:  NA  Discharge planning Services  CM Consult  Post Acute Care Choice:  NA Choice offered to:  NA  DME Arranged:    DME Agency:     HH Arranged:    HH Agency:     Status of Service:  In process, will continue to follow  Medicare Important Message Given:  Yes-second notification given Date Medicare IM Given:    Medicare IM give by:    Date Additional Medicare IM Given:    Additional Medicare Important Message give by:     If discussed at [place] of Stay Meetings, dates discussed:  11/15/2014  Additional Comments:  Sherald Barge, RN 11/15/2014, 2:28 PM

## 2014-11-15 NOTE — Progress Notes (Signed)
Speech Language Pathology Dysphagia Treatment Patient Details Name: Duane Burns MRN: [id number] DOB: 1935-04-04 Today's Date: 11/15/2014 Time: 5:50 PM  - 6:16 PM    Assessment / Plan / Recommendation Clinical Impression  Mr. Duane Burns had just completed evening meal upon SLP arrival, but agreeable to upgraded texture trials. His wife reported coughing after completion of his meal, but no coughing during meal. Pt subjectively reports "feeling better" and looks forward to going home. Pt assessed with thin water via cup and straw sips. Immediate cough after second trial straw sip; delayed coughing/throat clearing after crackers and water. Recommend upgrading to D3/mech soft and continue NTL during meals; ok for small sips thin water or ice chips only between meals (do not drink thin liquids during meals at this time). Pt and wife in agreement with recommendations. SLP will continue to follow for diet tolerance and upgrades as appropriate.    Diet Recommendation  Initiate / Change Diet: Dysphagia 3 (mechanical soft);Nectar-thick liquid    SLP Plan Continue with current plan of care   Pertinent Vitals/Pain VSS   Swallowing Goals  SLP Swallowing Goals Patient will consume recommended diet without observed clinical signs of aspiration with: Set-up Swallow Study Goal #1 - Progress: Progressing toward goal Patient will utilize recommended strategies during swallow to increase swallowing safety with: Set-up Swallow Study Goal #2 - Progress: Progressing toward goal  General Behavior/Cognition: Alert;Cooperative;Pleasant mood Patient Positioning: Upright in bed Oral care provided: N/A Other Pertinent Information: Mr. Duane Burns is a 79 yo male who woke up over a week ago with acute onset of dysarthria and dysphagia. Patient's symptoms persisted and he decided to seek medical attention for couple days. He was treated with steroids and amoxicillin by his primary care provider [facility]) but the  symptoms in fact deteriorated. He saw Dr. Benjamine Burns whose evaluation revealed that one of his vocal cords was apparently not working properly. The patient was admitted for further evaluation given the risk of aspiration. He was transferred to the unit because of some dyspnea associated with excessive salivation. Patient had to be suctioned vigorously. Dr. Merlene Burns questions possible neuromuscular causes (? pharyngeal variant of GBS or myasthenia gravis). He was started on immunoglobulin with good results. Pt reports improved swallowing abilities. Dr. Caryn Burns ordered BSE to assist with diet recommendations and compensatory strategies as needed.   Oral Cavity - Oral Hygiene  continue with oral care   Dysphagia Treatment Treatment focused on: Skilled observation of diet tolerance;Upgraded PO texture trials;Patient/family/caregiver education;Facilitation of pharyngeal phase;Utilization of compensatory strategies Family/Caregiver Educated: wife Treatment Methods: Skilled observation;Upgraded PO texture trial;Differential diagnosis;Compensation strategy training;Patient/caregiver education Patient observed directly with PO's: Yes Type of PO's observed: Regular;Dysphagia 3 (soft);Thin liquids;Nectar-thick liquids Feeding: Able to feed self Liquids provided via: Cup;Straw Oral Phase Signs & Symptoms:  (mild lingual residue with crackers) Pharyngeal Phase Signs & Symptoms: Multiple swallows;Delayed throat clear;Immediate cough Type of cueing: Verbal Amount of cueing: Moderate   Thank you,  Duane Burns, [place]      [place] 11/15/2014, 6:25 PM

## 2014-11-16 ENCOUNTER — Encounter (HOSPITAL_COMMUNITY): Payer: Self-pay | Admitting: Internal Medicine

## 2014-11-16 LAB — LYME DISEASE DNA BY PCR(BORRELIA BURG): LYME DISEASE(B. BURGDORFERI) PCR: NEGATIVE

## 2014-11-16 MED ORDER — PANTOPRAZOLE SODIUM 40 MG PO TBEC
40.0000 mg | DELAYED_RELEASE_TABLET | Freq: Every day | ORAL | Status: DC
  Administered 2014-11-16: 40 mg via ORAL
  Filled 2014-11-16: qty 1

## 2014-11-16 MED ORDER — STARCH (THICKENING) PO POWD: ORAL | Status: DC

## 2014-11-16 MED ORDER — FISH OIL 1200 MG PO CAPS: 1200.0000 mg | ORAL_CAPSULE | Freq: Two times a day (BID) | ORAL | Status: AC

## 2014-11-16 MED ORDER — OMEPRAZOLE 20 MG PO CPDR: 20.0000 mg | DELAYED_RELEASE_CAPSULE | Freq: Every day | ORAL | Status: DC

## 2014-11-16 NOTE — Progress Notes (Signed)
IV reved.  Central Telemetry called and notified that patient being discharged, telemetry removed.  Discharge instructions reviewed with patient and wife, questions answered, patient discharged via wheelchair to front entrance to go home with wife.  Stable at discharge.

## 2014-11-16 NOTE — Discharge Summary (Signed)
Physician Discharge Summary  Duane Burns WGN:[id number] DOB: 01-22-1936 DOA: 11/10/2014  PCP: [name], MD  Admit date: 11/10/2014 Discharge date: 11/16/2014  Time spent:  minutes  Recommendations for Outpatient Follow-up:  1. Patient will follow-up with neurologist, Dr. Merlene Laughter as scheduled. Acetylcholine receptor antibody result pending at the time of discharge. 2. Speech therapy will notify the patient for an outpatient follow-up evaluation. 3. Recommend outpatient biopsy of thyroid nodule.  4.    Discharge Diagnoses:  1. Dysphagia and dysphonia, secondary to myasthenia gravis, but pharyngeal variant of Guillain-Barr syndrome not completely ruled out. 2. Moderate C2 spinal stenosis with mild myelopathy. 3. Small left thyroid nodule. Radiology recommended further evaluation with a biopsy. 4. Essential hypertension. 5. Chronic foot drop secondary to chronic L5 radiculopathy, status post lumbar surgery in the past. 6. Dyspnea secondary to dysphagia/dysphonia. 7. Recent tick bite. [facility] spotted fever IgG positive, by IgM negative. Lyme disease titer negative. 8. Mild hypokalemia.    Discharge Condition: Improved.  Diet recommendation: Dysphagia 3 with nectar thickened liquids.  Filed Weights   11/13/14 0500 11/13/14 2158 11/14/14 0500  Weight: 84.1 kg (185 lb 6.5 oz) 83 kg (182 lb 15.7 oz) 84.3 kg (185 lb 13.6 oz)    History of present illness:  The patient is a 79 year old man with a history of chronic foot drop from L5 radiculopathy-status post previous lumbar surgery and hypertension, who presented to the ED on the/14/2016 with a chief complaint of difficulty swallowing and changes in his voice. Apparently, ENT physician, Dr. Benjamine Mola advised that the patient come to the emergency department due to his exam which revealed that his posterior pharynx was not moving normally. He was concerned that the patient had had a stroke. During the evaluation in the ED, CT  scan of the patient's head and MRI of his brain revealed no acute stroke, but there was moderate stenosis of the C2 cervical root and a 1.6-1.7 cm left thyroid nodule. He was admitted for further evaluation and management.   Hospital Course:  1. Dysphagia with dysphonia; secondary to myasthenia gravis versus pharyngeal variant of Guillain-Barr syndrome. Per the patient's history, he was recently treated with a short prednisone taper and amoxicillin for what was thought to be acute bronchitis a few days before admission. When he did not improve and was having difficulty swallowing, he was referred to ENT, Dr. Benjamine Mola. Apparently, Dr. Benjamine Mola sent the patient to the ED because his posterior pharynx was not moving normally. I discussed the patient with Dr. Benjamine Mola on 9/16 and he reported that the patient's soft palate was not elevating properly and he was concerned about an acute stroke; otherwise, he stated there was no obvious anatomical abnormality on his exam. -MRI of the patient's brain revealed no acute infarcts, but remote small cerebellar infarcts. -CT of his head and neck revealed a 1.6 cm left thyroid nodule. It also revealed C2 spinal stenosis. -Because of symptomatic shortness of breath and difficulty clearing his secretions, he was moved to the stepdown unit on 9/16. He was started on albuterol and Mucomyst nebulizers empirically. His follow-up chest x-ray was unremarkable. -Neurologist, Dr. Merlene Laughter was consulted. Following his assessment, his differential diagnosis included pharyngeal variant of Guillain-Barr syndrome, myasthenia gravis, and myopathy. However given the weakness of the patient's obicularis oculi muscles, he suspects myasthenia gravis is more likely. -Patient was started on immunoglobulin therapy. It was given for 5 days, per Dr. Merlene Laughter. -Labs studies ordered per Dr. Merlene Laughter: RPR was within normal limits. CK was  within normal limits. C-reactive protein and sedimentation rate were  within normal limits. TSH was within normal limits. Vitamin B12 was within normal limits.  -ANA was negative. [facility] spotted fever IgM was negative. Lyme disease PCR was negative. -Acetyl receptor antibody was pending. -Patient evaluated by the speech therapist on 11/14/14 and recommended a pured diet with nectar thickened liquids. Patient's diet was upgraded to dysphagia 3 with nectar thickened liquids by ST on follow-up evaluation. -The patient was discharged in much improved condition. He will follow-up with Dr. Merlene Laughter and speech therapy as scheduled.   C2 spinal stenosis with mild myelopathy. The patient's brain MRI revealed moderate spinal stenosis in the upper cervical region-at C2  resulting in apparent mild cord flattening. Dr. Merlene Laughter did not believe this is contributing to the patient's neurological symptoms. -Patient did not complain of neck pain, weakness, or numbness or tingling in his hands. -Would recommend consideration of not emergent outpatient neurosurgery consultation.  Chronic foot drop secondary to chronic L5 radiculopathy, status post lumbar surgery. -The patient ambulates with a cane.  Left thyroid nodule. CT of his head and neck revealed a 1.6 cm thyroid nodule. I d not believe this is contributing to his dysphagia. -Patient's TSH was within normal limits. -Patient will need an outpatient biopsy.  Hypertension. Patient is treated chronically with lisinopril. It was held when he was nothing by mouth. When necessary hydralazine was ordered. Subsequently, lisinopril was restarted when he was able to tolerate oral semisolid foods.Marland Kitchen His blood pressure improved.   Recent tick bite. Lyme disease DNA PCR was negative. and RMSF IgG was positive, but IgM was negative.  Mild hypokalemia. Potassium was added to the maintenance IV fluids. His serum potassium improved.       Procedures:  None  Consultations:  Neurology  Gastroenterology  Speech  therapy  Discharge Exam: Filed Vitals:   11/16/14 1117  BP: 129/73  Pulse:   Temp:   Resp:    temperature 90.9. Pulse 89. Rest her rate 18. Blood pressure 129/73. Oxygen saturation 92% on room air.     General: Alert 79 year old man in no acute distress.   Neck: Supple. No adenopathy, no thyromegaly, no masses palpated.  Cardiovascular: S1, S2, with no murmurs rubs or gallops.  Respiratory: Decreased breath sounds in the bases. Breathing nonlabored.   Abdomen: Positive bowel sounds, soft, nontender, nondistended.  Musculoskeletal/extremities: No pedal edema.  Neurologic: He is alert and oriented 3. Mild left eye ptosis. The patient was observed eating and he was able to chew and swallow his dysphagia 3 diet without gagging or choking.  Discharge Instructions   Discharge Instructions    Discharge instructions    Complete by:  As directed   FOLLOW A SOFT/CHOPPED DIET WITH NECTAR THICKENED LIQUIDS. ADD THICK-IT TO ALL OF YOUR THIN LIQUIDS. YOU CAN TAKE A FEW SIPS OF THIN LIQUIDS AND ICE CHIPS BETWEEN MEALS 2-3 TIMES DAILY.     Increase activity slowly    Complete by:  As directed           Current Discharge Medication List    START taking these medications   Details  food thickener (THICK-IT #2) POWD Add enough powdered to your thin liquids to reach the nectar thickened consistency. Qty: 1 Can, Refills: 3    omeprazole (PRILOSEC) 20 MG capsule Take 1 capsule (20 mg total) by mouth daily. Qty: 30 capsule, Refills: 0      CONTINUE these medications which have CHANGED   Details  Omega-3 Fatty  Acids (FISH OIL) 1200 MG CAPS Take 1-2 capsules (1,200-2,400 mg total) by mouth 2 (two) times daily. RESTART IN 1 WEEK IF YOUR SWALLOWING CONTINUES TO IMPROVE. Take 2 capsules in the morning and 1 capsule in the evening      CONTINUE these medications which have NOT CHANGED   Details  aspirin EC 81 MG tablet Take 81 mg by mouth daily.    cetirizine (ZYRTEC) 10 MG tablet  Take 10 mg by mouth at bedtime.    cholecalciferol (VITAMIN D) 1000 UNITS tablet Take 1,000 Units by mouth daily.    lisinopril (PRINIVIL,ZESTRIL) 10 MG tablet Take 10 mg by mouth at bedtime.     Multiple Vitamin (MULTIVITAMIN WITH MINERALS) TABS tablet Take 1 tablet by mouth daily.    pravastatin (PRAVACHOL) 80 MG tablet Take 80 mg by mouth daily.      STOP taking these medications     amoxicillin (AMOXIL) 500 MG capsule      calcium elemental as carbonate (TUMS ULTRA 1000) 400 MG tablet      Cinnamon 500 MG TABS      predniSONE (DELTASONE) 10 MG tablet      docusate sodium (COLACE) 100 MG capsule      ferrous sulfate 325 (65 FE) MG tablet      HYDROcodone-acetaminophen (NORCO) 7.5-325 MG per tablet      polyethylene glycol (MIRALAX / GLYCOLAX) packet      tiZANidine (ZANAFLEX) 4 MG tablet        No Known Allergies Follow-up Information    Follow up with DOONQUAH, KOFI, MD In 3 weeks.   Specialty:  Neurology   Contact information:   [address] [place] [address] [phone]       Follow up with [name], MD In 1 week.   Specialty:  Family Medicine   Contact information:   [place] [address] [phone]       Follow up with Phillips Odor, MD.   Specialty:  Neurology   Contact information:   [address] [place] [address] [phone]       Follow up with [name], MD.   Specialty:  [facility]   Contact information:   [address] [place] [address] [phone]        The results of significant diagnostics from this hospitalization (including imaging, microbiology, ancillary and laboratory) are listed below for reference.    Significant Diagnostic Studies: Dg Chest 2 View  11/10/2014   CLINICAL DATA:  Dysphagia  EXAM: CHEST  2 VIEW  COMPARISON:  May 30, 2010  FINDINGS: There is stable elevation of the right hemidiaphragm. There is no appreciable edema or  consolidation. Heart size and pulmonary vascularity are normal. No adenopathy. There is atherosclerotic change in aorta. There is degenerative change in the thoracic spine.  IMPRESSION: No edema or consolidation.  Stable elevation right hemidiaphragm.   Electronically Signed   By: Lowella Grip III M.D.   On: 11/10/2014 17:08   Ct Head Wo Contrast  11/10/2014   CLINICAL DATA:  Patient has difficulty swallowing and speaking since last week.  EXAM: CT HEAD WITHOUT CONTRAST  TECHNIQUE: Contiguous axial images were obtained from the base of the skull through the vertex without intravenous contrast.  COMPARISON:  None.  FINDINGS: No mass effect or midline shift. No evidence of acute intracranial hemorrhage, or infarction. No abnormal extra-axial fluid collections. Gray-white matter differentiation is normal. Basal cisterns are preserved. There is mild brain  parenchymal volume loss.  No depressed skull fractures. Visualized paranasal sinuses and mastoid air cells are not opacified.  IMPRESSION: No acute intracranial abnormality.  Mild brain parenchymal atrophy.   Electronically Signed   By: Fidela Salisbury M.D.   On: 11/10/2014 16:57   Ct Soft Tissue Neck W Contrast  11/10/2014   CLINICAL DATA:  Patient cannot swallow or tall since last week.  EXAM: CT NECK WITH CONTRAST  TECHNIQUE: Multidetector CT imaging of the neck was performed using the standard protocol following the bolus administration of intravenous contrast.  CONTRAST:  55mL OMNIPAQUE IOHEXOL 300 MG/ML  SOLN  COMPARISON:  MR brain earlier today reported separately.  FINDINGS: Pharynx and larynx: Normal.  Salivary glands: Normal.  Thyroid: LEFT lobe thyroid lesion, solid appearing, 16 x 17 mm cross-section.  Lymph nodes: No pathologic adenopathy.  Vascular: BILATERAL carotid calcification, not clearly flow reducing.  Limited intracranial: Negative.  Visualized orbits: Not visualized.  Mastoids and visualized paranasal sinuses: No fluid.  Skeleton:  Advanced spondylosis. Nuchal ligament calcification opposite C4 and C5.  Upper chest: No nodule or effusion. No pneumothorax. Degenerative change BILATERAL sternoclavicular joints.  IMPRESSION: No abnormality is seen within the pharynx, neck, or upper chest which might contribute to inability to swallow or talk.  16 x 17 mm solid LEFT lobe thyroid lesion. Findings meet consensus criteria for biopsy. Ultrasound-guided fine needle aspiration should be considered, as per the consensus statement: Management of Thyroid Nodules Detected at [place]: Society of Radiologists in [place]. Radiology 2005; [id number].   Electronically Signed   By: Staci Righter M.D.   On: 11/10/2014 20:36   Mr Brain Wo Contrast  11/10/2014   CLINICAL DATA:  One week duration inability to swallow and speak normally.  EXAM: MRI HEAD WITHOUT CONTRAST  TECHNIQUE: Multiplanar, multiecho pulse sequences of the brain and surrounding structures were obtained without intravenous contrast.  COMPARISON:  CT head earlier today.  FINDINGS: No evidence for acute infarction, hemorrhage, mass lesion, hydrocephalus, or extra-axial fluid. Moderate cerebral and cerebellar atrophy. Mild subcortical and periventricular T2 and FLAIR hyperintensities, likely chronic microvascular ischemic change. Normal pituitary and cerebellar tonsils. Small remote BILATERAL cerebellar infarcts.  Moderate spinal stenosis in the upper cervical region with pannus, and ligamentum flavum hypertrophy at the C2 level resulting in apparent cord flattening. Correlate clinically for myelopathy.  Flow voids are maintained throughout the carotid, basilar, and vertebral arteries. There are no areas of chronic hemorrhage.  Visualized calvarium, skull base, and upper cervical osseous structures unremarkable. Scalp and extracranial soft tissues, orbits, sinuses, and mastoids show no acute process.  Compared with prior CT, good general agreement.  IMPRESSION: Chronic  changes of atrophy and small vessel disease, without visible acute stroke or intracranial hemorrhage.  Moderate spinal stenosis in the upper cervical region related to pannus and ligamentum flavum hypertrophy at the C2 level. See discussion above.   Electronically Signed   By: Staci Righter M.D.   On: 11/10/2014 18:45   Dg Chest Port 1 View  11/11/2014   CLINICAL DATA:  Acute onset of shortness of breath and respiratory distress today which required moving the patient into the intensive care unit.  EXAM: PORTABLE CHEST - 1 VIEW  COMPARISON:  11/10/2014 and earlier.  FINDINGS: Suboptimal inspiration. Cardiac silhouette mildly enlarged for technique and degree of inspiration, unchanged. Chronic elevation of the right hemidiaphragm and chronic scar/atelectasis at the right lung base, unchanged. Lungs otherwise clear. Pulmonary vascularity normal. No visible pleural effusions.  IMPRESSION: Suboptimal inspiration.  No acute cardiopulmonary disease.   Electronically Signed   By: Evangeline Dakin M.D.   On: 11/11/2014 12:29    Microbiology: Recent Results (from the past 240 hour(s))  MRSA PCR Screening     Status: None   Collection Time: 11/11/14 11:39 AM  Result Value Ref Range Status   MRSA by PCR NEGATIVE NEGATIVE Final    Comment:        The GeneXpert MRSA Assay (FDA approved for NASAL specimens only), is one component of a comprehensive MRSA colonization surveillance program. It is not intended to diagnose MRSA infection nor to guide or monitor treatment for MRSA infections.      Labs: Basic Metabolic Panel:  Recent Labs Lab 11/10/14 1621 11/10/14 1627 11/12/14 0424 11/14/14 0630  NA 139 143 139 135  K 3.9 3.9 3.4* 4.3  CL 106 104 107 106  CO2 26  --  27 26  GLUCOSE 130* 129* 118* 109*  BUN 25* 23* 17 10  CREATININE 0.97 1.00 0.84 0.86  CALCIUM 9.5  --  8.2* 8.2*   Liver Function Tests:  Recent Labs Lab 11/10/14 1621  AST 21  ALT 19  ALKPHOS 73  BILITOT 0.6  PROT 7.6   ALBUMIN 4.4   No results for input(s): LIPASE, AMYLASE in the last 168 hours. No results for input(s): AMMONIA in the last 168 hours. CBC:  Recent Labs Lab 11/10/14 1621 11/10/14 1627 11/12/14 0424 11/14/14 0630  WBC 13.7*  --  10.1 8.3  NEUTROABS 11.8*  --   --   --   HGB 14.4 15.0 13.2 13.1  HCT 42.5 44.0 40.3 39.2  MCV 88.0  --  89.4 88.1  PLT 270  --  221 201   Cardiac Enzymes:  Recent Labs Lab 11/11/14 1730  CKTOTAL 82  CKMB 4.4   BNP: BNP (last 3 results) No results for input(s): BNP in the last 8760 hours.  ProBNP (last 3 results) No results for input(s): PROBNP in the last 8760 hours.  CBG: No results for input(s): GLUCAP in the last 168 hours.     Signed:  Hobson Lax  [facility] 11/16/2014, 1:04 PM

## 2014-11-16 NOTE — Care Management Important Message (Signed)
Important Message  Patient Details  Name: GIUSEPPE DUCHEMIN MRN: [id number] Date of Birth: 1936/01/13   Medicare Important Message Given:  Yes-third notification given    Joylene Draft, RN 11/16/2014, 1:39 PM

## 2014-11-16 NOTE — Progress Notes (Signed)
The patient is receiving Protonix by the intravenous route.  Based on criteria approved by the Pharmacy and [place], the medication is being converted to the equivalent oral dose form.  These criteria include: -No Active GI bleeding -Able to tolerate diet of full liquids (or better) or tube feeding OR able to tolerate other medications by the oral or enteral route  If you have any questions about this conversion, please contact the Pharmacy Department (ext [phone]).  Thank you.  Pricilla Larsson, [facility] 11/16/2014 9:02 AM

## 2014-11-16 NOTE — Care Management Note (Signed)
Case Management Note  Patient Details  Name: Duane Burns MRN: [id number] Date of Birth: 01-09-1936  Subjective/Objective:                    Action/Plan:   Expected Discharge Date:                  Expected Discharge Plan:  Home/Self Care  In-House Referral:  NA  Discharge planning Services  CM Consult  Post Acute Care Choice:  NA Choice offered to:  Patient, Spouse  DME Arranged:    DME Agency:     HH Arranged:    HH Agency:     Status of Service:  Completed, signed off  Medicare Important Message Given:  Yes-third notification given Date Medicare IM Given:    Medicare IM give by:    Date Additional Medicare IM Given:    Additional Medicare Important Message give by:     If discussed at [place] of Stay Meetings, dates discussed:    Additional Comments: Pt discharged home today with outpt ST services with [facility] (per pts choice). Referral form faxed to outpt PT dept and they will call pt and schedule appt times. Pt given the [facility] outpt PT dept contact information to follow up if no phone within 48 hours. No further CM needs noted. Pt and pts nurse aware of discharge arrangements. Christinia Gully [place], RN 11/16/2014, 1:40 PM

## 2014-12-01 LAB — ACETYLCHOLINE RECEPTOR AB, ALL
ACETYLCHOL BLOCK AB: 78 % — AB (ref 0–25)
ACETYLCHOLINE BINDING AB: 2.59 nmol/L — AB (ref 0.00–0.24)

## 2014-12-05 ENCOUNTER — Ambulatory Visit (HOSPITAL_COMMUNITY): Payer: Medicare HMO | Attending: Internal Medicine | Admitting: Speech Pathology

## 2014-12-05 DIAGNOSIS — R1310 Dysphagia, unspecified: Secondary | ICD-10-CM | POA: Insufficient documentation

## 2014-12-05 DIAGNOSIS — R471 Dysarthria and anarthria: Secondary | ICD-10-CM | POA: Insufficient documentation

## 2014-12-05 NOTE — Therapy (Signed)
[place], [place], [address] Phone: [phone]   Fax:  [phone]  Speech Language Pathology Evaluation  Patient Details  Name: Duane Burns MRN: [id number] Date of Birth: 09-Jun-1935 Referring Provider:  Rexene Alberts, MD  Encounter Date: 12/05/2014      End of Session - 12/05/14 1702    Visit Number 1   Number of Visits 8   Authorization Type [employer] Medicare   Authorization Time Period before 10th visit   SLP Start Time 1430   SLP Stop Time  1530   SLP Time Calculation (min) 60 min   Activity Tolerance Patient tolerated treatment well      Past Medical History  Diagnosis Date  . Hyperlipidemia   . Hypertension   . Foot drop, left     DUE TO NERVE INJURY IN BACK PER PT  . Pneumonia     JAN 2016  . Arthritis   . HNP (herniated nucleus pulposus with myelopathy), thoracic     HISTORY OF HNP  . Cancer     History of skin cancer / hx Prostate cancer  . Urgency of urination   . Urinary leakage   . Dysphonia 11/11/2014  . Dysphagia 11/10/2014  . Myasthenia gravis 11/12/2014    versus Guillain-Barr syndrome.    Past Surgical History  Procedure Laterality Date  . Prostate surgery    . Back surgery      X 3  . Skin cancer excision    . Tonsillectomy    . Total hip arthroplasty Right 05/10/2014    Procedure: RIGHT TOTAL HIP ARTHROPLASTY ANTERIOR APPROACH ;  Surgeon: Paralee Cancel, MD;  Location: [facility];  Service: Orthopedics;  Laterality: Right;  . Cystoscopy with urethral dilatation N/A 05/10/2014    Procedure: CYSTOSCOPY WITH URETHRAL DILATATION;  Surgeon: Alexis Frock, MD;  Location: [facility];  Service: Urology;  Laterality: N/A;  with catheter placement, prior to surgery. procedure performed on the stretcher.    There were no vitals filed for this visit.  Visit Diagnosis: Dysphagia - Plan: SLP plan of care cert/re-cert  Dysarthria - Plan: SLP plan of care cert/re-cert      Subjective  Assessment - 12/05/14 1644    Subjective "I can't drink water."   Patient is accompained by: Family member   Currently in Pain? No/denies            SLP Evaluation [facility] - 12/05/14 1644    SLP Visit Information   SLP Received On 12/05/14   Onset Date 11/11/2014   Medical Diagnosis Myasthenia Gravis   General Information   Other Pertinent Information Mr. Duane Burns is a 79 yo male who woke up over a week ago with acute onset of dysarthria and dysphagia. Patient's symptoms persisted and he decided to seek medical attention for couple days. He was treated with steroids and amoxicillin by his primary care provider [facility]) but the symptoms in fact deteriorated. He saw Dr. Benjamine Mola whose evaluation revealed that one of his vocal cords was apparently not working properly. The patient was admitted for further evaluation given the risk of aspiration. He was transferred to the unit because of some dyspnea associated with excessive salivation. Patient had to be suctioned vigorously. Dr. Merlene Laughter questions possible neuromuscular causes (? pharyngeal variant of GBS or myasthenia gravis). He was started on immunoglobulin with good results. Pt reports improved swallowing abilities. Dr. Caryn Section ordered BSE to assist with diet recommendations and compensatory strategies as needed.  Behavioral/Cognition alert and cooperative   Mobility Status Ambulates with cane   Prior Functional Status   Cognitive/Linguistic Baseline Within functional limits   Type of Home House    Lives With Spouse   Available Support Family   Pain Assessment   Pain Assessment No/denies pain   Cognition   Overall Cognitive Status Within Functional Limits for tasks assessed   Auditory Comprehension   Overall Auditory Comprehension Appears within functional limits for tasks assessed   Yes/No Questions Within Functional Limits   Commands Within Functional Limits   Conversation Moderately complex   Interfering Components Hearing    Visual Recognition/Discrimination   Discrimination Not tested   Reading Comprehension   Reading Status Not tested   Expression   Primary Mode of Expression Verbal   Verbal Expression   Overall Verbal Expression Appears within functional limits for tasks assessed   Initiation No impairment   Level of Generative/Spontaneous Verbalization Conversation   Repetition No impairment   Naming No impairment   Pragmatics No impairment   Interfering Components Speech intelligibility   Non-Verbal Means of Communication Not applicable   Written Expression   Written Expression Not tested   Oral Motor/Sensory Function   Overall Oral Motor/Sensory Function Impaired   Labial ROM Within Functional Limits   Labial Symmetry Within Functional Limits   Labial Strength Reduced Left   Labial Sensation Within Functional Limits   Labial Coordination WFL   Lingual Symmetry Within Functional Limits   Lingual Strength Reduced   Lingual Coordination Reduced   Facial ROM Within Functional Limits   Facial Symmetry Left drooping eyelid   Facial Strength Within Functional Limits   Facial Sensation Within Functional Limits   Facial Coordination WFL   Velum Impaired right;Impaired left   Mandible Within Functional Limits   Overall Oral Motor/Sensory Function Nasal air emission noted on plosive sounds suggestive of velopharyngeal incompetence   Motor Speech   Overall Motor Speech Impaired   Respiration Impaired   Level of Impairment Sentence   Phonation Normal   Resonance Hypernasality   Articulation Impaired   Level of Impairment Sentence   Intelligibility Intelligibility reduced   Word 75-100% accurate   Phrase 75-100% accurate   Sentence 75-100% accurate   Conversation 75-100% accurate   Motor Planning [place] functional limits   Motor Speech Errors Unaware;Aware   Effective Techniques Increased vocal intensity;Over-articulate   Phonation [facility]                         SLP Education  - 12/05/14 1658    Education provided Yes   Education Details oral motor/swallowing exercises and dysarthria strategies   Person(s) Educated Patient;Spouse   Methods Explanation;Demonstration;Handout;Verbal cues   Comprehension Verbalized understanding;Need further instruction          SLP Short Term Goals - 12/05/14 2139    SLP SHORT TERM GOAL #1   Title Pt will implement speech intelligibility strategies at the sentence level with 95% acc when given min cues   Baseline ~75% acc for implementation   Time 4   Period Weeks   Status New   SLP SHORT TERM GOAL #2   Title Pt will complete oral motor and pharyngeal exercises 3x/day with use of written cues as reported by pt/wife.   Baseline not completing at this time   Time 4   Period Weeks   Status New   SLP SHORT TERM GOAL #3   Title Pt will demonstrate safe and efficient  consumption of regular textures and thin liquids with use of compensatory strategies following MBSS.   Baseline currently consuming D3 and NTL   Time 4   Period Weeks   Status New          SLP Long Term Goals - 16-Dec-2014 2153-06-06    SLP LONG TERM GOAL #1   Title Same as short term          Plan - 12/16/2014 1705    Clinical Impression Statement Mr. Hurrell is known to this SLP from recent hospitalization for possible myasthenia gravis. He was discharged on a mechanical soft diet and nectar-thick liquids and is still using thickener at home (but not as much) per report. Pt and his wife also report decreased speech intelligibility. Evaluation today revealed mild labial weakness and left eyelid droop (wife does not know if this is baseline). Pt also with lingual weakness (seems more base of tongue) and decreased velopharyngeal coordination; pt with weak production of velars (k,g), nasal air emission on plosives, and decreased production of rapid alternating movements over time/speed. Pt with seemingly reduced hyolaryngeal excursion and with delayed volitional swallow.  Mild wet vocal quality after sips of thin liquids. Recommend 1) MBSS to objectively evaluate swallow due to continued difficulty managing thin liquids 2) Skilled SLP intervention to address dysarthria and dysphagia. Pt introduced to oral motor and pharyngeal strengthening exercises this date to complete at home. SLP will request order for MBSS. Pt/wife in agreement with POC.    Speech Therapy Frequency 2x / week   Duration 4 weeks   Treatment/Interventions Aspiration precaution training;Pharyngeal strengthening exercises;Diet toleration management by SLP;Compensatory techniques;Trials of upgraded texture/liquids;Cueing hierarchy;SLP instruction and feedback;Compensatory strategies;Patient/family education;Oral motor exercises   Potential to Achieve Goals Good   SLP Home Exercise Plan Pt will be independent with HEP as assigned to facilitate carryover of treatment strategies in           G-Codes - 12/16/2014 [id number]    Functional Assessment Tool Used clinical judgement   Functional Limitations Swallowing   Swallow Current Status (T9030) At least 40 percent but less than 60 percent impaired, limited or restricted   Swallow Goal Status (S9233) At least 1 percent but less than 20 percent impaired, limited or restricted      Problem List Patient Active Problem List   Diagnosis Date Noted  . Myasthenia gravis ([place]) 11/12/2014  . Cervical myelopathy ([place]) 11/12/2014  . Dysphonia 11/11/2014  . Left thyroid nodule 11/11/2014  . SOB (shortness of breath)   . Dysphagia 11/10/2014  . History of left foot drop 11/10/2014  . Essential hypertension 11/10/2014  . Obese 05/11/2014  . S/P right TKA 05/10/2014  . S/P hip replacement 05/10/2014  . Right leg pain 02/15/2014  . Varicose veins without complication 00/76/2263   Thank you,  Genene Churn, [place]  [facility] December 16, 2014, 10:01 PM  [place], [place],  [address] Phone: [phone]   Fax:  [phone]

## 2014-12-06 ENCOUNTER — Other Ambulatory Visit: Payer: Self-pay | Admitting: Neurology

## 2014-12-06 DIAGNOSIS — D4989 Neoplasm of unspecified behavior of other specified sites: Secondary | ICD-10-CM

## 2014-12-06 DIAGNOSIS — D150 Benign neoplasm of thymus: Principal | ICD-10-CM

## 2014-12-12 ENCOUNTER — Other Ambulatory Visit: Payer: Self-pay | Admitting: Neurology

## 2014-12-12 ENCOUNTER — Ambulatory Visit (HOSPITAL_COMMUNITY): Payer: Medicare HMO

## 2014-12-12 ENCOUNTER — Ambulatory Visit (HOSPITAL_COMMUNITY)
Admission: RE | Admit: 2014-12-12 | Discharge: 2014-12-12 | Disposition: A | Discharge status: Home / Self Care | Payer: Medicare HMO | Source: Ambulatory Visit | Attending: Neurology | Admitting: Neurology

## 2014-12-12 DIAGNOSIS — D4989 Neoplasm of unspecified behavior of other specified sites: Secondary | ICD-10-CM

## 2014-12-12 DIAGNOSIS — D150 Benign neoplasm of thymus: Principal | ICD-10-CM

## 2014-12-12 DIAGNOSIS — R1310 Dysphagia, unspecified: Secondary | ICD-10-CM | POA: Diagnosis not present

## 2014-12-12 DIAGNOSIS — I2510 Atherosclerotic heart disease of native coronary artery without angina pectoris: Secondary | ICD-10-CM | POA: Insufficient documentation

## 2014-12-12 MED ORDER — IOHEXOL 300 MG/ML  SOLN
75.0000 mL | Freq: Once | INTRAMUSCULAR | Status: AC | PRN
  Administered 2014-12-12: 75 mL via INTRAVENOUS

## 2014-12-14 ENCOUNTER — Other Ambulatory Visit: Payer: Self-pay | Admitting: Neurology

## 2014-12-14 DIAGNOSIS — R1310 Dysphagia, unspecified: Secondary | ICD-10-CM

## 2015-01-04 ENCOUNTER — Ambulatory Visit (HOSPITAL_COMMUNITY)
Admission: RE | Admit: 2015-01-04 | Discharge: 2015-01-04 | Disposition: A | Discharge status: Home / Self Care | Payer: Medicare HMO | Source: Ambulatory Visit | Attending: Neurology | Admitting: Neurology

## 2015-01-04 ENCOUNTER — Ambulatory Visit (HOSPITAL_COMMUNITY): Payer: Medicare HMO | Attending: Neurology | Admitting: Speech Pathology

## 2015-01-04 DIAGNOSIS — E041 Nontoxic single thyroid nodule: Secondary | ICD-10-CM | POA: Insufficient documentation

## 2015-01-04 DIAGNOSIS — E785 Hyperlipidemia, unspecified: Secondary | ICD-10-CM | POA: Insufficient documentation

## 2015-01-04 DIAGNOSIS — R471 Dysarthria and anarthria: Secondary | ICD-10-CM | POA: Insufficient documentation

## 2015-01-04 DIAGNOSIS — R1314 Dysphagia, pharyngoesophageal phase: Secondary | ICD-10-CM | POA: Insufficient documentation

## 2015-01-04 DIAGNOSIS — R1310 Dysphagia, unspecified: Secondary | ICD-10-CM

## 2015-01-04 DIAGNOSIS — I10 Essential (primary) hypertension: Secondary | ICD-10-CM | POA: Insufficient documentation

## 2015-01-04 NOTE — Therapy (Signed)
[place], [place], [address] Phone: [phone]   Fax:  [phone]  Modified Barium Swallow  Patient Details  Name: Duane Burns MRN: [id number] Date of Birth: 24-Oct-1935 No Data Recorded  Encounter Date: 01/04/2015      End of Session - 01/04/15 1506    Visit Number 1   Number of Visits 1   Authorization Type Aetna Medicare   SLP Start Time 8101   SLP Stop Time  1355   SLP Time Calculation (min) 50 min   Activity Tolerance Patient tolerated treatment well      Past Medical History  Diagnosis Date  . Hyperlipidemia   . Hypertension   . Foot drop, left     DUE TO NERVE INJURY IN BACK PER PT  . Pneumonia     JAN 2016  . Arthritis   . HNP (herniated nucleus pulposus with myelopathy), thoracic     HISTORY OF HNP  . Cancer     History of skin cancer / hx Prostate cancer  . Urgency of urination   . Urinary leakage   . Dysphonia 11/11/2014  . Dysphagia 11/10/2014  . Myasthenia gravis 11/12/2014    versus Guillain-Barr syndrome.    Past Surgical History  Procedure Laterality Date  . Prostate surgery    . Back surgery      X 3  . Skin cancer excision    . Tonsillectomy    . Total hip arthroplasty Right 05/10/2014    Procedure: RIGHT TOTAL HIP ARTHROPLASTY ANTERIOR APPROACH ;  Surgeon: Paralee Cancel, MD;  Location: [facility];  Service: Orthopedics;  Laterality: Right;  . Cystoscopy with urethral dilatation N/A 05/10/2014    Procedure: CYSTOSCOPY WITH URETHRAL DILATATION;  Surgeon: Alexis Frock, MD;  Location: [facility];  Service: Urology;  Laterality: N/A;  with catheter placement, prior to surgery. procedure performed on the stretcher.    There were no vitals filed for this visit.  Visit Diagnosis: Dysphagia, pharyngoesophageal phase      Subjective Assessment - 01/04/15 1459    Subjective "I have a hard time swallowing anything right now."   Patient is accompained by: Family member   Special  Tests MBSS   Currently in Pain? No/denies             General - 01/04/15 1500    General Information   Date of Onset 11/09/14   Other Pertinent Information Mr. Rye Decoste is a 79 yo male who woke up over a week ago with acute onset of dysarthria and dysphagia. Patient's symptoms persisted and he decided to seek medical attention for couple days. He was treated with steroids and amoxicillin by his primary care provider [facility]) but the symptoms in fact deteriorated. He saw Dr. Benjamine Mola whose evaluation revealed that one of his vocal cords was apparently not working properly. The patient was admitted for further evaluation given the risk of aspiration. He was transferred to the unit because of some dyspnea associated with excessive salivation. Patient had to be suctioned vigorously. Dr. Merlene Laughter questions possible neuromuscular causes (? pharyngeal variant of GBS or myasthenia gravis). He was started on immunoglobulin with good results. Pt reports improved swallowing abilities. Dr. Caryn Section ordered BSE to assist with diet recommendations and compensatory strategies as needed.    Type of Study Other (Comment)  MBSS   Reason for Referral Objectively evaluate swallowing function   Diet Prior to this Study Dysphagia 1 (puree);Nectar-thick liquids   Temperature Spikes  Noted No   Respiratory Status Room air   History of Recent Intubation No   Behavior/Cognition Alert;Cooperative;Pleasant mood   Oral Cavity - Dentition Adequate natural dentition/normal for age   Oral Motor / Sensory Function Within functional limits   Self-Feeding Abilities Able to feed self   Patient Positioning Upright in chair/Tumbleform   Baseline Vocal Quality Normal  hypernasal   Volitional Cough Strong;Congested   Volitional Swallow Able to elicit  weak   Anatomy Within functional limits   Pharyngeal Secretions Not observed secondary MBS            Oral Preparation/Oral Phase - 01/04/15 1501    Oral  Preparation/Oral Phase   Oral Phase Within functional limits   Electrical stimulation - Oral Phase   Was Electrical Stimulation Used No          Pharyngeal Phase - 01/04/15 1501    Pharyngeal Phase   Pharyngeal Phase Impaired   Pharyngeal - Nectar   Pharyngeal- Nectar Cup Swallow initiation at pyriform sinus;Delayed swallow initiation;Reduced pharyngeal peristalsis;Reduced epiglottic inversion;Reduced anterior laryngeal mobility;Reduced laryngeal elevation;Reduced tongue base retraction;Pharyngeal residue - valleculae;Pharyngeal residue - pyriform;Pharyngeal residue - posterior pharnyx;Pharyngeal residue - cp segment;Lateral channel residue   Pharyngeal- Nectar Straw Swallow initiation at pyriform sinus;Delayed swallow initiation;Reduced pharyngeal peristalsis;Reduced epiglottic inversion;Reduced anterior laryngeal mobility;Reduced laryngeal elevation;Reduced tongue base retraction;Pharyngeal residue - valleculae;Pharyngeal residue - pyriform;Pharyngeal residue - posterior pharnyx;Pharyngeal residue - cp segment;Lateral channel residue   Pharyngeal - Thin   Pharyngeal- Thin Cup Swallow initiation at pyriform sinus;Delayed swallow initiation;Reduced pharyngeal peristalsis;Reduced epiglottic inversion;Reduced anterior laryngeal mobility;Reduced laryngeal elevation;Reduced airway/laryngeal closure;Reduced tongue base retraction;Penetration/Aspiration before swallow;Pharyngeal residue - valleculae;Pharyngeal residue - pyriform;Pharyngeal residue - posterior pharnyx;Pharyngeal residue - cp segment;Lateral channel residue   Pharyngeal - Solids   Pharyngeal- Puree Swallow initiation at pyriform sinus;Delayed swallow initiation;Reduced pharyngeal peristalsis;Reduced epiglottic inversion;Reduced anterior laryngeal mobility;Reduced laryngeal elevation;Reduced tongue base retraction;Pharyngeal residue - valleculae;Pharyngeal residue - pyriform;Pharyngeal residue - posterior pharnyx;Lateral channel residue    Electrical Stimulation - Pharyngeal Phase   Was Electrical Stimulation Used No          Cricopharyngeal Phase - 01/04/15 1504    Cervical Esophageal Phase   Cervical Esophageal Phase Impaired   Cervical Esophageal Phase - Solids   Puree Reduced cricopharyngeal relaxation              SLP Short Term Goals - 12/05/14 2139    SLP SHORT TERM GOAL #1   Title Pt will implement speech intelligibility strategies at the sentence level with 95% acc when given min cues   Baseline ~75% acc for implementation   Time 4   Period Weeks   Status New   SLP SHORT TERM GOAL #2   Title Pt will complete oral motor and pharyngeal exercises 3x/day with use of written cues as reported by pt/wife.   Baseline not completing at this time   Time 4   Period Weeks   Status New   SLP SHORT TERM GOAL #3   Title Pt will demonstrate safe and efficient consumption of regular textures and thin liquids with use of compensatory strategies following MBSS.   Baseline currently consuming D3 and NTL   Time 4   Period Weeks   Status New          SLP Long Term Goals - 12/05/14 2155    SLP LONG TERM GOAL #1   Title Same as short term          Plan - 01/04/15 1527  Clinical Impression Statement Mr. Grewe was last seen in my office for a clinical swallow evaluation on 12/05/2014 and a recommendation for MBSS was made. Unfortunately, swallow function appears to have declined in that time. Mr. Lofstrom states he has lost over 30 pounds, however chart review reveals it to be closer to 16 pounds. He is only able to tolerate small amounts of po (apple sauce, cream potato, some liquids) and coughs constantly throughout intake. Speech is hypernasal and pt reports liquids coming out of his nose at times. Objective testing today via MBSS reveals significant (mod/severe) pharyngeal phase dysphagia characterized by delay in swallow initiation, severe weakness with decreased tongue base retraction, no epiglottic  deflection, decreased pharyngeal pressure, with decreased relaxation of UES resulting in severe residuals in pyriforms which pt was never able to completely clear. Surprisingly, pt did an adequate job of protecting his airway and was sensate to thin liquid penetration which he could cough and clear. Although pt was able to protect his airway during today's study, it is likely that he would have more difficulty doing this over the course of a meal. Pt is also had high risk for inability to meet nutritional needs given limited passage into esophagus. Majority of po remains in pyriforms and pt only able to "squeeze" small amounts through UES at a time. Pt also ends up orally expectorating some of bolus. His wife reports that he will cough up medications 20 minutes after he has taken them. Pt is taking Mestinon 3x/day for treatment, however I have concerns about whether this is dissolving in his pharynx versus traversing to stomach. Although objective test was not completed during his hospitalization, he is coughing much more and showing obvious difficulty swallowing all textures (which is worse than when he was discharged from the hospital). Pt may need to consider alternative means of nutrition and/or follow up with neurologist for medication management. Telephone call made to Dr. Merlene Laughter and study reviewed. He plans to see pt next week and will make adjustments as needed at that time. Recommend pt come in for dysphagia treatment as well. Pt to continue D1/puree with nectar-thick liquids and dysphagia therapy.   Speech Therapy Frequency 2x / week   Duration 4 weeks   Treatment/Interventions Aspiration precaution training;Pharyngeal strengthening exercises;Diet toleration management by SLP;Compensatory techniques;Trials of upgraded texture/liquids;Cueing hierarchy;SLP instruction and feedback;Compensatory strategies;Patient/family education;Oral motor exercises   Potential to Achieve Goals Fair   Potential  Considerations Severity of impairments   SLP Home Exercise Plan Pt will be independent with HEP as assigned to facilitate carryover of treatment strategies in    Consulted and Agree with Plan of Care Patient;Family member/caregiver   Family Member Consulted wife          G-Codes - 02/02/2015 Jun 25, 1526    Functional Assessment Tool Used MBSS; clinical judgment   Functional Limitations Swallowing   Swallow Current Status [phone]) At least 60 percent but less than 80 percent impaired, limited or restricted   Swallow Goal Status (X0383) At least 1 percent but less than 20 percent impaired, limited or restricted          Recommendations/Treatment - 02/02/15 1504    Swallow Evaluation Recommendations   Recommended Consults --  Follow up with neurologist; consider PEG   SLP Diet Recommendations Dysphagia 1 (Puree);Nectar   Liquid Administration via Cup   Medication Administration Crushed with puree   Supervision Patient able to self feed   Compensations Multiple dry swallows after each bite/sip;Effortful swallow   Postural Changes Remain  semi-upright after after feeds/meals (Comment);Seated upright at 90 degrees          Prognosis - 01/04/15 1506    Prognosis   Prognosis for Safe Diet Advancement Guarded   Barriers to Reach Goals Severity of deficits;Time post onset   Individuals Consulted   Consulted and Agree with Results and Recommendations Patient;Family member/caregiver;MD   Family Member Consulted wife   Report Sent to  Referring physician      Problem List Patient Active Problem List   Diagnosis Date Noted  . Myasthenia gravis ([place]) 11/12/2014  . Cervical myelopathy ([place]) 11/12/2014  . Dysphonia 11/11/2014  . Left thyroid nodule 11/11/2014  . SOB (shortness of breath)   . Dysphagia 11/10/2014  . History of left foot drop 11/10/2014  . Essential hypertension 11/10/2014  . Obese 05/11/2014  . S/P right TKA 05/10/2014  . S/P hip replacement 05/10/2014  . Right leg pain  02/15/2014  . Varicose veins without complication 81/27/5170   Thank you,  Genene Churn, [place]  [facility] 01/04/2015, 3:29 PM  [place] [address] [place], [place], [address] Phone: [phone]   Fax:  [phone]  Name: TYLAR AMBORN MRN: [id number] Date of Birth: 1935/03/29

## 2015-01-17 ENCOUNTER — Ambulatory Visit (HOSPITAL_COMMUNITY): Payer: Medicare HMO | Admitting: Speech Pathology

## 2015-01-17 DIAGNOSIS — R1314 Dysphagia, pharyngoesophageal phase: Secondary | ICD-10-CM | POA: Diagnosis not present

## 2015-01-17 DIAGNOSIS — R471 Dysarthria and anarthria: Secondary | ICD-10-CM

## 2015-01-17 DIAGNOSIS — R1310 Dysphagia, unspecified: Secondary | ICD-10-CM

## 2015-01-17 NOTE — Therapy (Signed)
[place], [place], [address] Phone: [phone]   Fax:  [phone]  Speech Language Pathology Treatment  Patient Details  Name: Duane Burns MRN: [id number] Date of Birth: 1935/12/31 No Data Recorded  Encounter Date: 01/17/2015      End of Session - 01/17/15 1807    Visit Number 2   Number of Visits 8   Authorization Type [employer]   Authorization Time Period before 10th visit   SLP Start Time 1520   SLP Stop Time  1600   SLP Time Calculation (min) 40 min   Activity Tolerance Patient tolerated treatment well      Past Medical History  Diagnosis Date  . Hyperlipidemia   . Hypertension   . Foot drop, left     DUE TO NERVE INJURY IN BACK PER PT  . Pneumonia     JAN 2016  . Arthritis   . HNP (herniated nucleus pulposus with myelopathy), thoracic     HISTORY OF HNP  . Cancer     History of skin cancer / hx Prostate cancer  . Urgency of urination   . Urinary leakage   . Dysphonia 11/11/2014  . Dysphagia 11/10/2014  . Myasthenia gravis 11/12/2014    versus Guillain-Barr syndrome.    Past Surgical History  Procedure Laterality Date  . Prostate surgery    . Back surgery      X 3  . Skin cancer excision    . Tonsillectomy    . Total hip arthroplasty Right 05/10/2014    Procedure: RIGHT TOTAL HIP ARTHROPLASTY ANTERIOR APPROACH ;  Surgeon: Paralee Cancel, MD;  Location: [facility];  Service: Orthopedics;  Laterality: Right;  . Cystoscopy with urethral dilatation N/A 05/10/2014    Procedure: CYSTOSCOPY WITH URETHRAL DILATATION;  Surgeon: Alexis Frock, MD;  Location: [facility];  Service: Urology;  Laterality: N/A;  with catheter placement, prior to surgery. procedure performed on the stretcher.    There were no vitals filed for this visit.  Visit Diagnosis: Dysphagia  Dysarthria      Subjective Assessment - 01/17/15 1800    Subjective "I am doing much better now."   Patient is accompained by:  Family member   Currently in Pain? No/denies               ADULT SLP TREATMENT - 01/17/15 1800    General Information   Behavior/Cognition Alert;Cooperative;Pleasant mood   Patient Positioning Upright in chair   Oral care provided N/A   HPI 79 yo male referred after hospitalization for suspected Myasthenia Gravis in September 2016. Had MBSS 01/04/2015; here for treatment   Treatment Provided   Treatment provided Dysphagia;Cognitive-Linquistic   Dysphagia Treatment   Temperature Spikes Noted No   Respiratory Status Room air   Oral Cavity - Dentition Adequate natural dentition   Treatment Methods Skilled observation;Therapeutic exercise;Patient/caregiver education;Compensation strategy training   Patient observed directly with PO's Yes   Type of PO's observed Thin liquids   Feeding Able to feed self   Liquids provided via Cup   Pharyngeal Phase Signs & Symptoms Multiple swallows;Audible swallow;Watery eyes   Type of cueing Verbal;Tactile   Amount of cueing Minimal   Pain Assessment   Pain Assessment No/denies pain   Cognitive-Linquistic Treatment   Treatment focused on Dysarthria;Patient/family/caregiver education   Skilled Treatment speech intelligibility strategies, swallowing exercises   Assessment / Recommendations / Plan   Plan Discharge SLP treatment due to (comment)  Pt and  wife would like to work at home instead    Dysphagia Recommendations   Diet recommendations Dysphagia 3 (mechanical soft);Nectar-thick liquid;Thin liquid   Medication Administration Crushed with puree   Supervision Full supervision/cueing for compensatory strategies   Compensations Slow rate;Small sips/bites;Multiple dry swallows after each bite/sip;Clear throat intermittently;Hard cough after swallow;Effortful swallow   Postural Changes and/or Swallow Maneuvers Out of bed for meals;Seated upright 90 degrees;Upright 30-60 min after meal          SLP Education - 01/17/15 1806    Education  provided Yes   Education Details oral motor and swallowing exercises; speech intelligibility strategies, signs/symptoms of aspiration   Person(s) Educated Patient;Spouse   Methods Explanation;Demonstration;Tactile cues;Verbal cues;Handout   Comprehension Verbalized understanding          SLP Short Term Goals - 01/17/15 1818    SLP SHORT TERM GOAL #1   Title Pt will implement speech intelligibility strategies at the sentence level with 95% acc when given min cues   Baseline ~75% acc for implementation   Time 4   Period Weeks   Status Achieved   SLP SHORT TERM GOAL #2   Title Pt will complete oral motor and pharyngeal exercises 3x/day with use of written cues as reported by pt/wife.   Baseline not completing at this time   Time 4   Period Weeks   Status Achieved   SLP SHORT TERM GOAL #3   Title Pt will demonstrate safe and efficient consumption of regular textures and thin liquids with use of compensatory strategies following MBSS.   Baseline currently consuming D3 and NTL   Time 4   Period Weeks   Status Partially Met          SLP Long Term Goals - 01/17/15 1818    SLP LONG TERM GOAL #1   Title Same as short term          Plan - 01/17/15 1808    Clinical Impression Statement Mr. Mom was seen 13 days ago for MBSS which showed mod/severe pharyngeal phase dysphagia characterized by delay in swallow initiation, severe weakness with decreased tongue base retraction, no epiglottic deflection, decreased pharyngeal pressure, and decreased UES relaxation resulting in severe residuals in pyriforms. Pt reported extreme weight loss at that visit and stated he was unable to tolerate much of anything. He was to see Dr. Merlene Laughter last week, which he did, and was reportedly much better and so medications were not changed. Today, pt reports that he was able to eat a sausage biscuit, drink a cup of coffee, eat chili beans and cake. He reports that he occasionally still gets "choked on  water", but does well with coffee and ice tea. Pt reports that he continues to do exercises assigned by SLP previously, but states he's "doing well now". Wife accompanied him to appointment and agrees with his remarks. SLP reviewed swallowing exercises and speech intelligibility strategies and wrote all recommendations down on paper to take home. It does sound as if pt is showing improvement in swallow function, however he and his wife are cautioned about signs of aspiration PNA and need to continue exercises. They state that they would like to continue with exercises at home and decline further SLP services at this time. Pt will need to be monitored closely for weight maintenance, lung sounds, and fever/congestion. Will discharge pt from care at this time, however they were encouraged to contact MD should swallow function decline. Pt/wife in agreement with plan.    Treatment/Interventions  Aspiration precaution training;Pharyngeal strengthening exercises;Diet toleration management by SLP;Compensatory techniques;Trials of upgraded texture/liquids;Cueing hierarchy;SLP instruction and feedback;Compensatory strategies;Patient/family education;Oral motor exercises   Potential to Achieve Goals Fair   Potential Considerations Severity of impairments   SLP Home Exercise Plan Pt will be independent with HEP as assigned to facilitate carryover of treatment strategies in    Consulted and Agree with Plan of Care Patient;Family member/caregiver   Family Member Consulted wife          G-Codes - 01-21-15 1819    Functional Assessment Tool Used clinical judgment   Functional Limitations Swallowing   Swallow Goal Status [id number]) At least 1 percent but less than 20 percent impaired, limited or restricted   Swallow Discharge Status [phone]) At least 20 percent but less than 40 percent impaired, limited or restricted      Problem List Patient Active Problem List   Diagnosis Date Noted  . Myasthenia gravis ([place])  11/12/2014  . Cervical myelopathy ([place]) 11/12/2014  . Dysphonia 11/11/2014  . Left thyroid nodule 11/11/2014  . SOB (shortness of breath)   . Dysphagia 11/10/2014  . History of left foot drop 11/10/2014  . Essential hypertension 11/10/2014  . Obese 05/11/2014  . S/P right TKA 05/10/2014  . S/P hip replacement 05/10/2014  . Right leg pain 02/15/2014  . Varicose veins without complication 09/81/1914   SPEECH THERAPY DISCHARGE SUMMARY  Visits from Start of Care: 2  Current functional level related to goals / functional outcomes: Partially met all goals; see above   Remaining deficits: Pt still has some difficulty consuming mech soft diet and thin liquids; but is happy with progress   Education / Equipment: Provided in written form  Plan: Patient agrees to discharge.  Patient goals were partially met. Patient is being discharged due to being pleased with the current functional level.  ?????       Thank you,  Genene Churn, [place]  Genene Churn 2015/01/21, 6:20 PM  [facility] [address] [place], [place], [address] Phone: [phone]   Fax:  [phone]   Name: Duane Burns MRN: [id number] Date of Birth: December 27, 1935

## 2015-01-25 ENCOUNTER — Ambulatory Visit (HOSPITAL_COMMUNITY): Payer: Medicare HMO | Admitting: Speech Pathology

## 2015-01-30 ENCOUNTER — Ambulatory Visit (HOSPITAL_COMMUNITY): Payer: Medicare HMO | Admitting: Speech Pathology

## 2015-02-01 ENCOUNTER — Ambulatory Visit (HOSPITAL_COMMUNITY): Payer: Medicare HMO | Admitting: Speech Pathology

## 2015-02-06 ENCOUNTER — Ambulatory Visit (HOSPITAL_COMMUNITY): Payer: Medicare HMO | Admitting: Speech Pathology

## 2015-02-09 ENCOUNTER — Ambulatory Visit (HOSPITAL_COMMUNITY): Payer: Medicare HMO | Admitting: Speech Pathology

## 2015-09-25 ENCOUNTER — Ambulatory Visit (INDEPENDENT_AMBULATORY_CARE_PROVIDER_SITE_OTHER): Payer: PPO | Admitting: Family Medicine

## 2015-09-25 ENCOUNTER — Encounter: Payer: Self-pay | Admitting: Family Medicine

## 2015-09-25 VITALS — BP 125/67 | HR 60 | Temp 98.4°F | Ht 66.5 in | Wt 166.4 lb

## 2015-09-25 DIAGNOSIS — L089 Local infection of the skin and subcutaneous tissue, unspecified: Secondary | ICD-10-CM | POA: Diagnosis not present

## 2015-09-25 DIAGNOSIS — B958 Unspecified staphylococcus as the cause of diseases classified elsewhere: Secondary | ICD-10-CM

## 2015-09-25 MED ORDER — MINOCYCLINE HCL 100 MG PO CAPS: 100.0000 mg | ORAL_CAPSULE | Freq: Two times a day (BID) | ORAL | 0 refills | Status: DC

## 2015-09-25 NOTE — Progress Notes (Signed)
Subjective:  Patient ID: Duane Burns, male    DOB: 18-Feb-1936  Age: 80 y.o. MRN: [id number]  CC: Oral Swelling (lower, started on Friday) and Establish Care   HPI Duane Burns presents for Increased swelling at the right lower lip. This started with a little white blister several days ago. It has been painful and swollen and growing for the last 3 days. There have been no systemic symptoms such as fever chills sweats. No sore throat. His wife has noticed a little swelling posteriorly at the jawline as well.  History Duane Burns has a past medical history of Arthritis; Cancer ([place]); Dysphagia (11/10/2014); Dysphonia (11/11/2014); Foot drop, left; HNP (herniated nucleus pulposus with myelopathy), thoracic; Hyperlipidemia; Hypertension; Myasthenia gravis ( [employer]) (11/12/2014); Pneumonia; Urgency of urination; and Urinary leakage.   He has a past surgical history that includes ostate surgery; Back surgery; Skin cancer excision; Tonsillectomy; Total hip arthroplasty (Right, 05/10/2014); and Cystoscopy with urethral dilatation (N/A, 05/10/2014).   His family history includes Diabetes in his daughter; Hypertension in his daughter.He reports that he quit smoking about 21 years ago. He has never used smokeless tobacco. He reports that he does not drink alcohol or use drugs.  Current Outpatient Prescriptions on File Prior to Visit  Medication Sig Dispense Refill  . aspirin EC 81 MG tablet Take 81 mg by mouth daily.    . cetirizine (ZYRTEC) 10 MG tablet Take 10 mg by mouth at bedtime.    . cholecalciferol (VITAMIN D) 1000 UNITS tablet Take 1,000 Units by mouth daily.    . food thickener (THICK-IT #2) POWD Add enough powdered to your thin liquids to reach the nectar thickened consistency. 1 Can 3  . Multiple Vitamin (MULTIVITAMIN WITH MINERALS) TABS tablet Take 1 tablet by mouth daily.    . Omega-3 Fatty Acids (FISH OIL) 1200 MG CAPS Take 1-2 capsules (1,200-2,400 mg total) by mouth 2 (two) times daily.  RESTART IN 1 WEEK IF YOUR SWALLOWING CONTINUES TO IMPROVE. Take 2 capsules in the morning and 1 capsule in the evening    . pravastatin (PRAVACHOL) 80 MG tablet Take 80 mg by mouth daily.     No current facility-administered medications on file prior to visit.     ROS Review of Systems  Constitutional: Negative for activity change, chills, diaphoresis and fever.  HENT: Negative for congestion, drooling and sore throat.   Eyes: Negative for pain.  Respiratory: Negative for choking.   Gastrointestinal: Negative for nausea.    Objective:  BP 125/67 (BP Location: Left Arm, Patient Position: Sitting, Cuff Size: Normal)   Pulse 60   Temp 98.4 F (36.9 C) (Oral)   Ht 5' 6.5" (1.689 m)   Wt 166 lb 6.4 oz (75.5 kg)   SpO2 96%   BMI 26.46 kg/m   Physical Exam  Constitutional: He appears well-developed and well-nourished. No distress.  HENT:  Head: Atraumatic.  The lower lip at the familial border has marked erythema with an open lesion about 4 mm long that appears to have been draining pus that is now dried. There is some induration but no fluctuance. The lesion itself is about 1 cm at the chin  Eyes: Pupils are equal, round, and reactive to light.  Skin: Skin is warm and dry. There is erythema.    Assessment & Plan:   Duane Burns was seen today for oral swelling and establish care.  Diagnoses and all orders for this visit:  Staph skin infection  Other orders -  minocycline (MINOCIN) 100 MG capsule; Take 1 capsule (100 mg total) by mouth 2 (two) times daily. Take on an empty stomach   I have discontinued Duane Burns's lisinopril and omeprazole. I am also having him start on minocycline. Additionally, I am having him maintain his pravastatin, cholecalciferol, multivitamin with minerals, cetirizine, aspirin EC, Fish Oil, food thickener, and pyridostigmine.  Meds ordered this encounter  Medications  . pyridostigmine (MESTINON) 60 MG tablet    Sig: Take 60 mg by mouth 3 (three)  times daily.   . minocycline (MINOCIN) 100 MG capsule    Sig: Take 1 capsule (100 mg total) by mouth 2 (two) times daily. Take on an empty stomach    Dispense:  20 capsule    Refill:  0   WArm compresses 4-6 times daily.  Follow-up: Return if symptoms worsen or fail to improve.  Claretta Fraise, M.D.

## 2015-11-21 ENCOUNTER — Ambulatory Visit (INDEPENDENT_AMBULATORY_CARE_PROVIDER_SITE_OTHER): Payer: PPO | Admitting: Family Medicine

## 2015-11-21 ENCOUNTER — Encounter: Payer: Self-pay | Admitting: Family Medicine

## 2015-11-21 VITALS — BP 130/66 | HR 54 | Temp 97.6°F | Ht 66.5 in | Wt 166.2 lb

## 2015-11-21 DIAGNOSIS — R011 Cardiac murmur, unspecified: Secondary | ICD-10-CM | POA: Diagnosis not present

## 2015-11-21 DIAGNOSIS — E041 Nontoxic single thyroid nodule: Secondary | ICD-10-CM

## 2015-11-21 DIAGNOSIS — G7000 Myasthenia gravis without (acute) exacerbation: Secondary | ICD-10-CM

## 2015-11-21 DIAGNOSIS — I10 Essential (primary) hypertension: Secondary | ICD-10-CM

## 2015-11-21 NOTE — Patient Instructions (Signed)
Great to meet you!  Lets see yo uagain in 4 months, you are always welcome to come back sooner if needed.

## 2015-11-21 NOTE — Progress Notes (Signed)
   HPI  Patient presents today here to establish care and discuss chronic medical conditions.  He has myasthenia gravis treated with pyridostigmine with good effect.   His hypertension is diet controlled.  He has a history of HLD- Last LDL 94 Denies any complaints today.  He does not remember getting a thyroid nodule biopsy were addressed. Thyroid nodule is noted on CT neck in September 2016   PMH: Smoking status noted ROS: Per HPI  Objective: BP 130/66   Pulse (!) 54   Temp 97.6 F (36.4 C) (Oral)   Ht 5' 6.5" (1.689 m)   Wt 166 lb 3.2 oz (75.4 kg)   BMI 26.42 kg/m  Gen: NAD, alert, cooperative with exam HEENT: NCAT, PERRLA, oropharynx clear, TMs normal bilaterally CV: RRR, good S1/S2, no murmur Resp: CTABL, no wheezes, non-labored Abd: SNTND, BS present, no guarding or organomegaly Ext: No edema, warm Neuro: Alert and oriented, 2+ symmetric patellar tendon reflexes bilaterally, strength or/5 and symmetric in bilateral lower extremities, AFO on the left lower extremity  Assessment and plan:  # Hypertension Diet controlled, monitor Labs  # Hyperlipidemia Diet controlled, repeating labs  # Myasthenia gravis Managed well, continue pyridostigmine  # Left-sided thyroid nodule Repeat ultrasound, unclear if this was worked up, however pain patient and family does not remember it. Likely needs biopsy. Thyroid studies    Orders Placed This Encounter  Procedures  . US Soft Tissue Head/Neck    Standing Status:   Future    Standing Expiration Date:   01/20/2017    Order Specific Question:   Reason for Exam (SYMPTOM  OR DIAGNOSIS REQUIRED)    Answer:   Hx of thyroid nodule 10/2014    Order Specific Question:   Preferred imaging location?    Answer:   [facility] CBC with Differential  . Lipid panel  . TSH  . T3, Free  . T4, Free     Laroy Apple, MD [facility] 11/21/2015, 9:08 AM

## 2015-11-22 LAB — CBC WITH DIFFERENTIAL/PLATELET
BASOS ABS: 0 10*3/uL (ref 0.0–0.2)
Basos: 1 %
EOS (ABSOLUTE): 0.3 10*3/uL (ref 0.0–0.4)
Eos: 4 %
HEMOGLOBIN: 13.9 g/dL (ref 12.6–17.7)
Hematocrit: 42.9 % (ref 37.5–51.0)
IMMATURE GRANS (ABS): 0 10*3/uL (ref 0.0–0.1)
Immature Granulocytes: 0 %
LYMPHS ABS: 2 10*3/uL (ref 0.7–3.1)
LYMPHS: 28 %
MCH: 29.1 pg (ref 26.6–33.0)
MCHC: 32.4 g/dL (ref 31.5–35.7)
MCV: 90 fL (ref 79–97)
MONOCYTES: 9 %
Monocytes Absolute: 0.7 10*3/uL (ref 0.1–0.9)
NEUTROS PCT: 58 %
Neutrophils Absolute: 4.2 10*3/uL (ref 1.4–7.0)
Platelets: 240 10*3/uL (ref 150–379)
RBC: 4.78 x10E6/uL (ref 4.14–5.80)
RDW: 13.7 % (ref 12.3–15.4)
WBC: 7.2 10*3/uL (ref 3.4–10.8)

## 2015-11-22 LAB — T3, FREE: T3 FREE: 3.3 pg/mL (ref 2.0–4.4)

## 2015-11-22 LAB — CMP14+EGFR
A/G RATIO: 1.9 (ref 1.2–2.2)
ALBUMIN: 4.4 g/dL (ref 3.5–4.8)
ALK PHOS: 73 IU/L (ref 39–117)
ALT: 16 IU/L (ref 0–44)
AST: 19 IU/L (ref 0–40)
BILIRUBIN TOTAL: 0.7 mg/dL (ref 0.0–1.2)
BUN/Creatinine Ratio: 15 (ref 10–24)
BUN: 14 mg/dL (ref 8–27)
CHLORIDE: 102 mmol/L (ref 96–106)
CO2: 27 mmol/L (ref 18–29)
Calcium: 9.4 mg/dL (ref 8.6–10.2)
Creatinine, Ser: 0.92 mg/dL (ref 0.76–1.27)
GFR calc Af Amer: 92 mL/min/{1.73_m2} (ref >59)
GFR calc non Af Amer: 79 mL/min/{1.73_m2} (ref >59)
GLOBULIN, TOTAL: 2.3 g/dL (ref 1.5–4.5)
Glucose: 90 mg/dL (ref 65–99)
Potassium: 4.8 mmol/L (ref 3.5–5.2)
Sodium: 141 mmol/L (ref 134–144)
TOTAL PROTEIN: 6.7 g/dL (ref 6.0–8.5)

## 2015-11-22 LAB — LIPID PANEL
CHOLESTEROL TOTAL: 160 mg/dL (ref 100–199)
Chol/HDL Ratio: 3 ratio units (ref 0.0–5.0)
HDL: 53 mg/dL (ref >39)
LDL CALC: 92 mg/dL (ref 0–99)
TRIGLYCERIDES: 73 mg/dL (ref 0–149)
VLDL Cholesterol Cal: 15 mg/dL (ref 5–40)

## 2015-11-22 LAB — TSH: TSH: 3.48 u[IU]/mL (ref 0.450–4.500)

## 2015-11-22 LAB — T4, FREE: Free T4: 1.14 ng/dL (ref 0.82–1.77)

## 2015-11-28 ENCOUNTER — Ambulatory Visit (HOSPITAL_COMMUNITY)
Admission: RE | Admit: 2015-11-28 | Discharge: 2015-11-28 | Disposition: A | Discharge status: Home / Self Care | Payer: PPO | Source: Ambulatory Visit | Attending: Family Medicine | Admitting: Family Medicine

## 2015-11-28 DIAGNOSIS — E042 Nontoxic multinodular goiter: Secondary | ICD-10-CM | POA: Insufficient documentation

## 2015-11-28 DIAGNOSIS — E041 Nontoxic single thyroid nodule: Secondary | ICD-10-CM

## 2015-11-29 ENCOUNTER — Telehealth: Payer: Self-pay | Admitting: Family Medicine

## 2015-11-29 NOTE — Telephone Encounter (Signed)
Aware of results. 

## 2016-02-01 ENCOUNTER — Other Ambulatory Visit: Payer: Self-pay | Admitting: Family Medicine

## 2016-02-28 DIAGNOSIS — R1310 Dysphagia, unspecified: Secondary | ICD-10-CM | POA: Diagnosis not present

## 2016-02-28 DIAGNOSIS — M5416 Radiculopathy, lumbar region: Secondary | ICD-10-CM | POA: Diagnosis not present

## 2016-02-28 DIAGNOSIS — D519 Vitamin B12 deficiency anemia, unspecified: Secondary | ICD-10-CM | POA: Diagnosis not present

## 2016-02-28 DIAGNOSIS — G5603 Carpal tunnel syndrome, bilateral upper limbs: Secondary | ICD-10-CM | POA: Diagnosis not present

## 2016-02-28 DIAGNOSIS — G603 Idiopathic progressive neuropathy: Secondary | ICD-10-CM | POA: Diagnosis not present

## 2016-02-28 DIAGNOSIS — Z79899 Other long term (current) drug therapy: Secondary | ICD-10-CM | POA: Diagnosis not present

## 2016-02-28 DIAGNOSIS — G7000 Myasthenia gravis without (acute) exacerbation: Secondary | ICD-10-CM | POA: Diagnosis not present

## 2016-02-28 DIAGNOSIS — R471 Dysarthria and anarthria: Secondary | ICD-10-CM | POA: Diagnosis not present

## 2016-03-22 ENCOUNTER — Encounter (INDEPENDENT_AMBULATORY_CARE_PROVIDER_SITE_OTHER): Payer: Self-pay

## 2016-03-22 ENCOUNTER — Ambulatory Visit (INDEPENDENT_AMBULATORY_CARE_PROVIDER_SITE_OTHER): Payer: Medicare HMO | Admitting: Family Medicine

## 2016-03-22 ENCOUNTER — Encounter: Payer: Self-pay | Admitting: Family Medicine

## 2016-03-22 VITALS — BP 131/67 | HR 65 | Temp 98.1°F | Ht 66.5 in | Wt 176.4 lb

## 2016-03-22 DIAGNOSIS — R7303 Prediabetes: Secondary | ICD-10-CM | POA: Diagnosis not present

## 2016-03-22 DIAGNOSIS — G7000 Myasthenia gravis without (acute) exacerbation: Secondary | ICD-10-CM | POA: Diagnosis not present

## 2016-03-22 DIAGNOSIS — E041 Nontoxic single thyroid nodule: Secondary | ICD-10-CM

## 2016-03-22 DIAGNOSIS — R490 Dysphonia: Secondary | ICD-10-CM

## 2016-03-22 LAB — BAYER DCA HB A1C WAIVED: HB A1C: 5.5 % (ref <7.0)

## 2016-03-22 NOTE — Patient Instructions (Signed)
Great to see you!  We will work on an appointment with Dr. Merlene Laughter, a neurologist, to discuss myasthenia gravis.   We will call with labs within 1 week.

## 2016-03-22 NOTE — Progress Notes (Signed)
   HPI  Patient presents today to discuss chronic medical problems including myasthenia gravis, prediabetes,  left thyroid nodule  Is diagnosed with myasthenia gravis during hospitalization in Sept 2016. He has done well with pyridostigmine. He has had onset of hoarseness since his last neuro f/u about 1-2 months ago. Describes off and on hoarseness without throat clearing or sore thraot.   In general he feels well. He denies any fever, chills, sweats. He is swallowing normally.  Prediabetes Patient is unaware of diagnosis, does not watch his diet or exercise regularly He has a foot drop that limits his exercise.  Left thyroid nodule Reviewed imaging from the fall, no new throat fullness.   PMH: Smoking status noted ROS: Per HPI  Objective: BP 131/67   Pulse 65   Temp 98.1 F (36.7 C) (Oral)   Ht 5' 6.5" (1.689 m)   Wt 176 lb 6.4 oz (80 kg)   BMI 28.05 kg/m  Gen: NAD, alert, cooperative with exam HEENT: NCAT, oropharynx clear and moist, nares clear without significant swelling of the turbinates, TMs obscured by cerumen bilaterally CV: RRR, good S1/S2, no murmur Resp: CTABL, no wheezes, non-labored Ext: No edema, warm Neuro: Alert and oriented, No gross deficits  Assessment and plan:  # Hoarseness Unclear etiology, differential includes postnasal drip (possible, but less likely without throat clearing, sore throat, or signs on exam), myasthenia gravis, or worsening of his thyroid nodule. Recommended labs including TSH, follow-up with neurology to see if this is typical of myasthenia gravis. If these are unrevealing would consider ENT versus imaging of the neck to evaluate thyroid nodule.  # Myasthenia gravis Doing well on pyridostigmine, has had recent neurology follow-up Recommending follow-up in the next 2-4 weeks  # Left thyroid nodule Clinically not appreciated easily Patient is due for follow-up imaging with repeat ultrasound in 9 months. Consider CT of the neck  if hoarseness is not explained by myasthenia gravis  # Prediabetes Repeating A1c and labs today     Orders Placed This Encounter  Procedures  . Ambulatory referral to Neurology    Referral Priority:   Routine    Referral Type:   Consultation    Referral Reason:   Specialty Services Required    Requested Specialty:   Neurology    Number of Visits Requested:   [place], MD [facility] 03/22/2016, 9:34 AM

## 2016-03-23 LAB — CMP14+EGFR
A/G RATIO: 1.8 (ref 1.2–2.2)
ALT: 17 IU/L (ref 0–44)
AST: 21 IU/L (ref 0–40)
Albumin: 4.3 g/dL (ref 3.5–4.8)
Alkaline Phosphatase: 80 IU/L (ref 39–117)
BUN/Creatinine Ratio: 19 (ref 10–24)
BUN: 18 mg/dL (ref 8–27)
Bilirubin Total: 0.5 mg/dL (ref 0.0–1.2)
CHLORIDE: 103 mmol/L (ref 96–106)
CO2: 26 mmol/L (ref 18–29)
Calcium: 9.3 mg/dL (ref 8.6–10.2)
Creatinine, Ser: 0.96 mg/dL (ref 0.76–1.27)
GFR calc non Af Amer: 75 mL/min/{1.73_m2} (ref >59)
GFR, EST AFRICAN AMERICAN: 87 mL/min/{1.73_m2} (ref >59)
GLUCOSE: 86 mg/dL (ref 65–99)
Globulin, Total: 2.4 g/dL (ref 1.5–4.5)
POTASSIUM: 4.5 mmol/L (ref 3.5–5.2)
Sodium: 142 mmol/L (ref 134–144)
TOTAL PROTEIN: 6.7 g/dL (ref 6.0–8.5)

## 2016-03-23 LAB — THYROID PANEL WITH TSH
FREE THYROXINE INDEX: 2.2 (ref 1.2–4.9)
T3 UPTAKE RATIO: 30 % (ref 24–39)
T4 TOTAL: 7.3 ug/dL (ref 4.5–12.0)
TSH: 4.98 u[IU]/mL — ABNORMAL HIGH (ref 0.450–4.500)

## 2016-04-16 DIAGNOSIS — D519 Vitamin B12 deficiency anemia, unspecified: Secondary | ICD-10-CM | POA: Diagnosis not present

## 2016-04-16 DIAGNOSIS — G5603 Carpal tunnel syndrome, bilateral upper limbs: Secondary | ICD-10-CM | POA: Diagnosis not present

## 2016-04-16 DIAGNOSIS — Z79899 Other long term (current) drug therapy: Secondary | ICD-10-CM | POA: Diagnosis not present

## 2016-04-16 DIAGNOSIS — G603 Idiopathic progressive neuropathy: Secondary | ICD-10-CM | POA: Diagnosis not present

## 2016-04-16 DIAGNOSIS — M5416 Radiculopathy, lumbar region: Secondary | ICD-10-CM | POA: Diagnosis not present

## 2016-04-16 DIAGNOSIS — G7000 Myasthenia gravis without (acute) exacerbation: Secondary | ICD-10-CM | POA: Diagnosis not present

## 2016-04-16 DIAGNOSIS — R1310 Dysphagia, unspecified: Secondary | ICD-10-CM | POA: Diagnosis not present

## 2016-04-16 DIAGNOSIS — R471 Dysarthria and anarthria: Secondary | ICD-10-CM | POA: Diagnosis not present

## 2016-04-28 ENCOUNTER — Other Ambulatory Visit: Payer: Self-pay | Admitting: Family Medicine

## 2016-06-13 DIAGNOSIS — Z7982 Long term (current) use of aspirin: Secondary | ICD-10-CM | POA: Diagnosis not present

## 2016-06-13 DIAGNOSIS — J309 Allergic rhinitis, unspecified: Secondary | ICD-10-CM | POA: Diagnosis not present

## 2016-06-13 DIAGNOSIS — Z79899 Other long term (current) drug therapy: Secondary | ICD-10-CM | POA: Diagnosis not present

## 2016-06-13 DIAGNOSIS — M21379 Foot drop, unspecified foot: Secondary | ICD-10-CM | POA: Diagnosis not present

## 2016-06-13 DIAGNOSIS — G7000 Myasthenia gravis without (acute) exacerbation: Secondary | ICD-10-CM | POA: Diagnosis not present

## 2016-06-13 DIAGNOSIS — E785 Hyperlipidemia, unspecified: Secondary | ICD-10-CM | POA: Diagnosis not present

## 2016-06-13 DIAGNOSIS — Z7952 Long term (current) use of systemic steroids: Secondary | ICD-10-CM | POA: Diagnosis not present

## 2016-06-13 DIAGNOSIS — Z0000 Encounter for general adult medical examination without abnormal findings: Secondary | ICD-10-CM | POA: Diagnosis not present

## 2016-07-29 DIAGNOSIS — G7000 Myasthenia gravis without (acute) exacerbation: Secondary | ICD-10-CM | POA: Diagnosis not present

## 2016-07-29 DIAGNOSIS — Z79899 Other long term (current) drug therapy: Secondary | ICD-10-CM | POA: Diagnosis not present

## 2016-07-29 DIAGNOSIS — R1310 Dysphagia, unspecified: Secondary | ICD-10-CM | POA: Diagnosis not present

## 2016-07-29 DIAGNOSIS — M5416 Radiculopathy, lumbar region: Secondary | ICD-10-CM | POA: Diagnosis not present

## 2016-07-29 DIAGNOSIS — R269 Unspecified abnormalities of gait and mobility: Secondary | ICD-10-CM | POA: Diagnosis not present

## 2016-07-29 DIAGNOSIS — G5603 Carpal tunnel syndrome, bilateral upper limbs: Secondary | ICD-10-CM | POA: Diagnosis not present

## 2016-07-29 DIAGNOSIS — G603 Idiopathic progressive neuropathy: Secondary | ICD-10-CM | POA: Diagnosis not present

## 2016-07-29 DIAGNOSIS — R471 Dysarthria and anarthria: Secondary | ICD-10-CM | POA: Diagnosis not present

## 2016-07-29 DIAGNOSIS — D519 Vitamin B12 deficiency anemia, unspecified: Secondary | ICD-10-CM | POA: Diagnosis not present

## 2016-08-01 ENCOUNTER — Ambulatory Visit (INDEPENDENT_AMBULATORY_CARE_PROVIDER_SITE_OTHER): Payer: Medicare HMO | Admitting: Family Medicine

## 2016-08-01 ENCOUNTER — Encounter: Payer: Self-pay | Admitting: Family Medicine

## 2016-08-01 VITALS — BP 134/59 | HR 61 | Temp 98.1°F | Ht 66.5 in | Wt 169.6 lb

## 2016-08-01 DIAGNOSIS — I10 Essential (primary) hypertension: Secondary | ICD-10-CM | POA: Diagnosis not present

## 2016-08-01 DIAGNOSIS — R7303 Prediabetes: Secondary | ICD-10-CM

## 2016-08-01 NOTE — Progress Notes (Signed)
   HPI  Patient presents today here for follow-up chronic medical conditions.  Hypertension Doing well without any medications. No chest pain, dyspnea, palpitations, leg edema.  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 He has good strength, denies fatigue.  PMH: Smoking status noted ROS: Per HPI  Objective: BP (!) 134/59   Pulse 61   Temp 98.1 F (36.7 C) (Oral)   Ht 5' 6.5" (1.689 m)   Wt 169 lb 9.6 oz (76.9 kg)   BMI 26.96 kg/m  Gen: NAD, alert, cooperative with exam HEENT: NCAT,  CV: RRR, good E1/V4, 3/6 systolic murmur loudest at the right sternal border Resp: CTABL, no wheezes, non-labored Ext: No edema, warm Neuro: Alert and oriented, No gross deficits  Assessment and plan:  # Hypertension Diet controlled, well controlled without medications Continue to monitor  # Prediabetes Well-controlled, A1c previously checked at 5.5 Plan repeat A1c at least annually, repeat labs next follow-up in 4 months.    Duane Apple, MD [facility] 08/01/2016, 4:19 PM

## 2016-08-01 NOTE — Patient Instructions (Signed)
Great to see you!  Come back in 4 months unless you need us sooner.    

## 2016-08-02 ENCOUNTER — Other Ambulatory Visit: Payer: Self-pay | Admitting: Family Medicine

## 2016-08-07 ENCOUNTER — Other Ambulatory Visit: Payer: Medicare HMO

## 2016-08-07 DIAGNOSIS — Z1212 Encounter for screening for malignant neoplasm of rectum: Secondary | ICD-10-CM | POA: Diagnosis not present

## 2016-08-08 LAB — FECAL OCCULT BLOOD, IMMUNOCHEMICAL: Fecal Occult Bld: NEGATIVE

## 2016-11-05 ENCOUNTER — Other Ambulatory Visit: Payer: Self-pay | Admitting: Family Medicine

## 2016-11-27 ENCOUNTER — Other Ambulatory Visit: Payer: Medicare HMO

## 2016-11-27 DIAGNOSIS — I10 Essential (primary) hypertension: Secondary | ICD-10-CM

## 2016-11-28 LAB — CBC WITH DIFFERENTIAL/PLATELET
Basophils Absolute: 0 10*3/uL (ref 0.0–0.2)
Basos: 0 %
EOS (ABSOLUTE): 0 10*3/uL (ref 0.0–0.4)
EOS: 0 %
HEMATOCRIT: 43.7 % (ref 37.5–51.0)
HEMOGLOBIN: 14.1 g/dL (ref 13.0–17.7)
IMMATURE GRANS (ABS): 0 10*3/uL (ref 0.0–0.1)
IMMATURE GRANULOCYTES: 0 %
LYMPHS ABS: 2 10*3/uL (ref 0.7–3.1)
Lymphs: 15 %
MCH: 29.6 pg (ref 26.6–33.0)
MCHC: 32.3 g/dL (ref 31.5–35.7)
MCV: 92 fL (ref 79–97)
Monocytes Absolute: 0.6 10*3/uL (ref 0.1–0.9)
Monocytes: 5 %
NEUTROS PCT: 80 %
Neutrophils Absolute: 10.6 10*3/uL — ABNORMAL HIGH (ref 1.4–7.0)
Platelets: 238 10*3/uL (ref 150–379)
RBC: 4.77 x10E6/uL (ref 4.14–5.80)
RDW: 13.7 % (ref 12.3–15.4)
WBC: 13.3 10*3/uL — AB (ref 3.4–10.8)

## 2016-11-28 LAB — CMP14+EGFR
ALBUMIN: 4.5 g/dL (ref 3.5–4.8)
ALK PHOS: 65 IU/L (ref 39–117)
ALT: 28 IU/L (ref 0–44)
AST: 26 IU/L (ref 0–40)
Albumin/Globulin Ratio: 2.1 (ref 1.2–2.2)
BUN / CREAT RATIO: 25 — AB (ref 10–24)
BUN: 22 mg/dL (ref 8–27)
Bilirubin Total: 0.7 mg/dL (ref 0.0–1.2)
CO2: 25 mmol/L (ref 20–29)
CREATININE: 0.88 mg/dL (ref 0.76–1.27)
Calcium: 9.5 mg/dL (ref 8.6–10.2)
Chloride: 102 mmol/L (ref 96–106)
GFR calc Af Amer: 94 mL/min/{1.73_m2} (ref >59)
GFR calc non Af Amer: 82 mL/min/{1.73_m2} (ref >59)
GLUCOSE: 112 mg/dL — AB (ref 65–99)
Globulin, Total: 2.1 g/dL (ref 1.5–4.5)
Potassium: 4.7 mmol/L (ref 3.5–5.2)
Sodium: 143 mmol/L (ref 134–144)
Total Protein: 6.6 g/dL (ref 6.0–8.5)

## 2016-11-28 LAB — LIPID PANEL
Chol/HDL Ratio: 2.4 ratio (ref 0.0–5.0)
Cholesterol, Total: 176 mg/dL (ref 100–199)
HDL: 73 mg/dL (ref >39)
LDL CALC: 92 mg/dL (ref 0–99)
Triglycerides: 53 mg/dL (ref 0–149)
VLDL CHOLESTEROL CAL: 11 mg/dL (ref 5–40)

## 2016-11-28 LAB — TSH: TSH: 1.69 u[IU]/mL (ref 0.450–4.500)

## 2016-12-02 ENCOUNTER — Ambulatory Visit (INDEPENDENT_AMBULATORY_CARE_PROVIDER_SITE_OTHER): Payer: Medicare HMO | Admitting: Family Medicine

## 2016-12-02 ENCOUNTER — Encounter: Payer: Self-pay | Admitting: Family Medicine

## 2016-12-02 VITALS — BP 130/66 | HR 63 | Temp 98.7°F | Ht 66.5 in | Wt 180.8 lb

## 2016-12-02 DIAGNOSIS — R7303 Prediabetes: Secondary | ICD-10-CM

## 2016-12-02 DIAGNOSIS — B079 Viral wart, unspecified: Secondary | ICD-10-CM

## 2016-12-02 DIAGNOSIS — D72829 Elevated white blood cell count, unspecified: Secondary | ICD-10-CM

## 2016-12-02 DIAGNOSIS — I10 Essential (primary) hypertension: Secondary | ICD-10-CM

## 2016-12-02 DIAGNOSIS — Z23 Encounter for immunization: Secondary | ICD-10-CM | POA: Diagnosis not present

## 2016-12-02 LAB — BAYER DCA HB A1C WAIVED: HB A1C (BAYER DCA - WAIVED): 5.7 % (ref <7.0)

## 2016-12-02 LAB — CBC WITH DIFFERENTIAL/PLATELET
BASOS: 0 %
Basophils Absolute: 0 10*3/uL (ref 0.0–0.2)
EOS (ABSOLUTE): 0 10*3/uL (ref 0.0–0.4)
Eos: 0 %
HEMOGLOBIN: 13.9 g/dL (ref 13.0–17.7)
Hematocrit: 42 % (ref 37.5–51.0)
IMMATURE GRANS (ABS): 0 10*3/uL (ref 0.0–0.1)
Immature Granulocytes: 0 %
LYMPHS ABS: 1.5 10*3/uL (ref 0.7–3.1)
LYMPHS: 16 %
MCH: 29.8 pg (ref 26.6–33.0)
MCHC: 33.1 g/dL (ref 31.5–35.7)
MCV: 90 fL (ref 79–97)
MONOCYTES: 8 %
Monocytes Absolute: 0.8 10*3/uL (ref 0.1–0.9)
NEUTROS ABS: 7.2 10*3/uL — AB (ref 1.4–7.0)
Neutrophils: 76 %
Platelets: 237 10*3/uL (ref 150–379)
RBC: 4.66 x10E6/uL (ref 4.14–5.80)
RDW: 13.7 % (ref 12.3–15.4)
WBC: 9.7 10*3/uL (ref 3.4–10.8)

## 2016-12-02 NOTE — Progress Notes (Addendum)
.    HPI  Patient presents today here for follow-up chronic medical conditions as well as new warts.  Patient states that he has set a new warts that have popped up over the last month or so. He's tried Compound W over-the-counter which actually made the warts get larger.  HTN No chest pain or dyspnea.   Prediabetes He does watch his diet. discussed elevated fasting BG  We discussed his elevated WBC, he denies any signs or symptoms of illness. He feels well and has a normal appetite currently. He states he has a mild throat clearing cough which has been going on for many months now, he attributes this to myasthenia gravis  PMH: Smoking status noted ROS: Per HPI  Objective: BP 130/66   Pulse 63   Temp 98.7 F (37.1 C) (Oral)   Ht 5' 6.5" (1.689 m)   Wt 180 lb 12.8 oz (82 kg)   BMI 28.74 kg/m  Gen: NAD, alert, cooperative with exam HEENT: NCAT CV: RRR, good S1/S2, no murmur Resp: CTABL, no wheezes, non-labored Ext: No edema, warm Neuro: Alert and oriented, No gross deficits Skin:  Verruca approximately 7-8 mm in diameter 2 on L knee, 2 smaller verruca approximately 3 mm in diameter on theL knee, 1 verruca proximally 4-5 mm diameter on right knee 1 verruca on the right posterior forearm approximately 3-4 mm in diameter 1 verruca on the right upper chest approximately 2-3 mm in   Assessment and plan:  # Viral warts, verruca Considering the number and new numbness in each of the lesions I offered dermatology which he declines for now. Cryotherapy 7  # Hypertension Well controlled without medications, continue to monitor  # Leukocytosis Unexplained, mild No signs or symptoms of serious bacterial infection. Repeat labs  Consider abnormality of immune system given way to multiple viral warts recently, however her signs of this either.  # Prediabetes Discussed, recommended therapeutic lifestyle changes which she has undergone  Need for influenza vaccine Flu vaccine  given today, counseling provided for all vaccine components    Orders Placed This Encounter  Procedures  . CBC with Differential/Platelet  . [facility] Hb A1c Carney Bern, MD [facility] 12/02/2016, 8:59 AM

## 2016-12-02 NOTE — Patient Instructions (Signed)
Great to see you!  Come back in 4 months unless you need us sooner.    

## 2016-12-09 ENCOUNTER — Encounter: Payer: Self-pay | Admitting: *Deleted

## 2016-12-09 ENCOUNTER — Ambulatory Visit (INDEPENDENT_AMBULATORY_CARE_PROVIDER_SITE_OTHER): Payer: Medicare HMO | Admitting: *Deleted

## 2016-12-09 VITALS — BP 163/65 | HR 64 | Ht 66.5 in | Wt 181.0 lb

## 2016-12-09 DIAGNOSIS — Z0000 Encounter for general adult medical examination without abnormal findings: Secondary | ICD-10-CM | POA: Diagnosis not present

## 2016-12-09 NOTE — Patient Instructions (Signed)
  Duane Burns , Thank you for taking time to come for your Medicare Wellness Visit. I appreciate your ongoing commitment to your health goals. Please review the following plan we discussed and let me know if I can assist you in the future.   These are the goals we discussed: Try to stay active and add 15 minutes of walking to your daily routine.  This is a list of the screening recommended for you and due dates:  Health Maintenance  Topic Date Due  . Tetanus Vaccine  10/16/1956  . Pneumonia vaccines (1 of 2 - PCV13) 10/17/2002  . Flu Shot  Completed    Review Advance Directives and bring a signed, notarized copy to our office

## 2016-12-09 NOTE — Progress Notes (Signed)
Subjective:   Duane Burns is a 81 y.o. male who presents for an Initial Medicare Annual Wellness Visit. 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 He lives at home with his wife and they have 3 adult biological children, 5 grandchildren, and 5 great grandchildren.   Review of Systems  Reports that his health is better than last year.   Cardiac Risk Factors include: advanced age (>62men, >58 women);dyslipidemia;male gender;sedentary lifestyle  Musculoskeletal: left foot drop  Other systems negative today.    Objective:    Today's Vitals   12/09/16 0842  BP: (!) 163/65  Pulse: 64  Weight: 181 lb (82.1 kg)  Height: 5' 6.5" (1.689 m)   Body mass index is 28.78 kg/m.  Current Medications (verified) Outpatient Encounter Prescriptions as of 12/09/2016  Medication Sig  . aspirin EC 81 MG tablet Take 81 mg by mouth daily.  . cetirizine (ZYRTEC) 10 MG tablet Take 10 mg by mouth at bedtime.  . cholecalciferol (VITAMIN D) 1000 UNITS tablet Take 1,000 Units by mouth daily.  . Multiple Vitamin (MULTIVITAMIN WITH MINERALS) TABS tablet Take 1 tablet by mouth daily.  . Omega-3 Fatty Acids (FISH OIL) 1200 MG CAPS Take 1-2 capsules (1,200-2,400 mg total) by mouth 2 (two) times daily. RESTART IN 1 WEEK IF YOUR SWALLOWING CONTINUES TO IMPROVE. Take 2 capsules in the morning and 1 capsule in the evening  . pravastatin (PRAVACHOL) 80 MG tablet TAKE 1 TABLET EVERY EVENING  . pyridostigmine (MESTINON) 60 MG tablet Take 60 mg by mouth 3 (three) times daily.    No facility-administered encounter medications on file as of 12/09/2016.     Allergies (verified) Patient has no known allergies.   History: Past Medical History:  Diagnosis Date  . Arthritis   . Cancer [facility])    History of skin cancer / hx Prostate cancer  . Dysphagia 11/10/2014  . Dysphonia 11/11/2014  . Foot drop, left    DUE TO  NERVE INJURY IN BACK PER PT  . HNP (herniated nucleus pulposus with myelopathy), thoracic    HISTORY OF HNP  . Hyperlipidemia   . Hypertension   . Myasthenia gravis ([place]) 11/12/2014   versus Guillain-Barr syndrome.  . Pneumonia    JAN 2016  . Urgency of urination   . Urinary leakage    Past Surgical History:  Procedure Laterality Date  . BACK SURGERY     X 3  . CYSTOSCOPY WITH URETHRAL DILATATION N/A 05/10/2014   Procedure: CYSTOSCOPY WITH URETHRAL DILATATION;  Surgeon: Alexis Frock, MD;  Location: [facility];  Service: Urology;  Laterality: N/A;  with catheter placement, prior to surgery. procedure performed on the stretcher.  Marland Kitchen PROSTATE SURGERY    . SKIN CANCER EXCISION    . TONSILLECTOMY    . TOTAL HIP ARTHROPLASTY Right 05/10/2014   Procedure: RIGHT TOTAL HIP ARTHROPLASTY ANTERIOR APPROACH ;  Surgeon: Paralee Cancel, MD;  Location: [facility];  Service: Orthopedics;  Laterality: Right;   Family History  Problem Relation Age of Onset  . Diabetes Other   . Hypertension Other    Social History   Occupational History  . Not on file.   Social History Main Topics  . Smoking status: Former Smoker    Quit date: 05/05/1994  . Smokeless tobacco: Never Used  . Alcohol use No  . Drug use: No  . Sexual activity: Not  Currently   Tobacco Counseling No tobacco use  Activities of Daily Living In your present state of health, do you have any difficulty performing the following activities: 12/09/2016  Hearing? Y  Vision? Y  Comment some blurred vision with glasses. Last eye exam was a little over a year ago  Difficulty concentrating or making decisions? Y  Comment some trouble with remembering people names for the past several months  Walking or climbing stairs? Y  Comment Left foot drop, uses a brace and walks with a cane. Uses hand rail at home.   Dressing or bathing? N  Doing errands, shopping? N  Preparing Food and eating ? N  Using the Toilet? N  In the past six months, have you  accidently leaked urine? Y  Comment hx of prostate cancer and has had bladder leakage since then  Do you have problems with loss of bowel control? Y  Comment Has some urgency and may not make it to the bathroom in time  Managing your Medications? N  Comment his wife keeps up with all of their medicines  Managing your Finances? N  Housekeeping or managing your Housekeeping? N  Some recent data might be hidden    Immunizations and Health Maintenance Immunization History  Administered Date(s) Administered  . Influenza, High Dose Seasonal PF 10/31/2015, 12/02/2016  . Influenza,inj,Quad PF,6+ Mos 11/15/2014   Health Maintenance Due  Topic Date Due  . TETANUS/TDAP  10/16/1956  . PNA vac Low Risk Adult (1 of 2 - PCV13) 10/17/2002    Patient Care Team: Timmothy Euler, MD as PCP - General (Family Medicine)  Venetia Night, OD-Eye doctor  No hospitalizations, ER visits, or surgeries this past year    Assessment:   This is a routine wellness examination for [place].   Hearing/Vision screen Hearing deficit noted during visit. Eye exam is past due.   Dietary issues and exercise activities discussed: Current Exercise Habits: The patient does not participate in regular exercise at present (stays active around home and yard. Cuts woods, mows, and weedeats), Exercise limited by: orthopedic condition(s) (left foot drop)  Diet: 3 meals a day prepared by his wife and sometimes a snack at bedtime  Goals    . Exercise 150 minutes per week (moderate activity)       Depression Screen PHQ 2/9 Scores 12/09/2016 12/02/2016 08/01/2016 03/22/2016  PHQ - 2 Score 0 0 0 0    Fall Risk Fall Risk  12/09/2016 12/02/2016 08/01/2016 03/22/2016 11/21/2015  Falls in the past year? No No No No No  Number falls in past yr: - - - - -  Injury with Fall? - - - - -  Risk for fall due to : - - - - -    Cognitive Function: MMSE - Mini Mental State Exam 12/09/2016  Orientation to time 5  Orientation to Place 5    Registration 3  Attention/ Calculation 0  Attention/Calculation-comments did not attempt  Recall 1  Language- name 2 objects 2  Language- repeat 0  Language- follow 3 step command 3  Language- read & follow direction 1  Write a sentence 1  Copy design 0  Total score 21  did not attempt the attention/calculation part but there is still some concern for an abnormal exam      Screening Tests Health Maintenance  Topic Date Due  . TETANUS/TDAP  10/16/1956  . PNA vac Low Risk Adult (1 of 2 - PCV13) 10/17/2002  . INFLUENZA VACCINE  Completed  Declined prevnar today    Plan:   Advance directives given and reviewed. Patient will bring back a signed/notarized copy Try to incorporate more psychical activity into daily routine.  Schedule eye exam Consider pneumonia vaccine Keep f/u with PCP  I have personally reviewed and noted the following in the patient's chart:   . Medical and social history . Use of alcohol, tobacco or illicit drugs  . Current medications and supplements . Functional ability and status . Nutritional status . Physical activity . Advanced directives . List of other physicians . Hospitalizations, surgeries, and ER visits in previous 12 months . Vitals . Screenings to include cognitive, depression, and falls . Referrals and appointments  In addition, I have reviewed and discussed with patient certain preventive protocols, quality metrics, and best practice recommendations. A written personalized care plan for preventive services as well as general preventive health recommendations were provided to patient.     Chong Sicilian, RN  12/09/2016     I have reviewed and agree with the above AWV documentation.   Laroy Apple, MD [facility] 12/09/2016, 11:54 AM

## 2017-01-20 DIAGNOSIS — G7000 Myasthenia gravis without (acute) exacerbation: Secondary | ICD-10-CM | POA: Diagnosis not present

## 2017-01-20 DIAGNOSIS — D519 Vitamin B12 deficiency anemia, unspecified: Secondary | ICD-10-CM | POA: Diagnosis not present

## 2017-01-20 DIAGNOSIS — M5416 Radiculopathy, lumbar region: Secondary | ICD-10-CM | POA: Diagnosis not present

## 2017-01-20 DIAGNOSIS — G603 Idiopathic progressive neuropathy: Secondary | ICD-10-CM | POA: Diagnosis not present

## 2017-02-11 DIAGNOSIS — C61 Malignant neoplasm of prostate: Secondary | ICD-10-CM | POA: Diagnosis not present

## 2017-02-13 ENCOUNTER — Encounter: Payer: Self-pay | Admitting: Family Medicine

## 2017-02-13 DIAGNOSIS — Z8546 Personal history of malignant neoplasm of prostate: Secondary | ICD-10-CM | POA: Insufficient documentation

## 2017-03-01 ENCOUNTER — Other Ambulatory Visit: Payer: Self-pay | Admitting: Family Medicine

## 2017-03-06 DIAGNOSIS — G603 Idiopathic progressive neuropathy: Secondary | ICD-10-CM | POA: Diagnosis not present

## 2017-03-06 DIAGNOSIS — M5412 Radiculopathy, cervical region: Secondary | ICD-10-CM | POA: Diagnosis not present

## 2017-03-20 DIAGNOSIS — H5200 Hypermetropia, unspecified eye: Secondary | ICD-10-CM | POA: Diagnosis not present

## 2017-03-20 DIAGNOSIS — I10 Essential (primary) hypertension: Secondary | ICD-10-CM | POA: Diagnosis not present

## 2017-03-20 DIAGNOSIS — H25813 Combined forms of age-related cataract, bilateral: Secondary | ICD-10-CM | POA: Diagnosis not present

## 2017-04-02 ENCOUNTER — Other Ambulatory Visit: Payer: Medicare HMO

## 2017-04-02 DIAGNOSIS — R7303 Prediabetes: Secondary | ICD-10-CM

## 2017-04-02 DIAGNOSIS — I10 Essential (primary) hypertension: Secondary | ICD-10-CM | POA: Diagnosis not present

## 2017-04-02 LAB — BAYER DCA HB A1C WAIVED: HB A1C (BAYER DCA - WAIVED): 5.5 % (ref <7.0)

## 2017-04-03 LAB — LIPID PANEL
CHOLESTEROL TOTAL: 189 mg/dL (ref 100–199)
Chol/HDL Ratio: 3.2 ratio (ref 0.0–5.0)
HDL: 59 mg/dL (ref >39)
LDL Calculated: 116 mg/dL — ABNORMAL HIGH (ref 0–99)
Triglycerides: 72 mg/dL (ref 0–149)
VLDL Cholesterol Cal: 14 mg/dL (ref 5–40)

## 2017-04-03 LAB — CMP14+EGFR
ALBUMIN: 4.4 g/dL (ref 3.5–4.8)
ALT: 22 IU/L (ref 0–44)
AST: 22 IU/L (ref 0–40)
Albumin/Globulin Ratio: 1.8 (ref 1.2–2.2)
Alkaline Phosphatase: 67 IU/L (ref 39–117)
BUN / CREAT RATIO: 18 (ref 10–24)
BUN: 21 mg/dL (ref 8–27)
Bilirubin Total: 0.4 mg/dL (ref 0.0–1.2)
CALCIUM: 9.4 mg/dL (ref 8.6–10.2)
CO2: 24 mmol/L (ref 20–29)
Chloride: 104 mmol/L (ref 96–106)
Creatinine, Ser: 1.17 mg/dL (ref 0.76–1.27)
GFR, EST AFRICAN AMERICAN: 68 mL/min/{1.73_m2} (ref >59)
GFR, EST NON AFRICAN AMERICAN: 59 mL/min/{1.73_m2} — AB (ref >59)
Globulin, Total: 2.5 g/dL (ref 1.5–4.5)
Glucose: 94 mg/dL (ref 65–99)
POTASSIUM: 5.4 mmol/L — AB (ref 3.5–5.2)
Sodium: 144 mmol/L (ref 134–144)
TOTAL PROTEIN: 6.9 g/dL (ref 6.0–8.5)

## 2017-04-03 LAB — CBC WITH DIFFERENTIAL/PLATELET
BASOS ABS: 0 10*3/uL (ref 0.0–0.2)
BASOS: 0 %
EOS (ABSOLUTE): 0.1 10*3/uL (ref 0.0–0.4)
Eos: 1 %
HEMATOCRIT: 44.3 % (ref 37.5–51.0)
Hemoglobin: 14.6 g/dL (ref 13.0–17.7)
Immature Grans (Abs): 0.1 10*3/uL (ref 0.0–0.1)
Immature Granulocytes: 1 %
LYMPHS ABS: 2.9 10*3/uL (ref 0.7–3.1)
LYMPHS: 21 %
MCH: 29.9 pg (ref 26.6–33.0)
MCHC: 33 g/dL (ref 31.5–35.7)
MCV: 91 fL (ref 79–97)
MONOCYTES: 8 %
Monocytes Absolute: 1.1 10*3/uL — ABNORMAL HIGH (ref 0.1–0.9)
NEUTROS ABS: 10.5 10*3/uL — AB (ref 1.4–7.0)
Neutrophils: 69 %
Platelets: 303 10*3/uL (ref 150–379)
RBC: 4.89 x10E6/uL (ref 4.14–5.80)
RDW: 13.8 % (ref 12.3–15.4)
WBC: 14.7 10*3/uL — ABNORMAL HIGH (ref 3.4–10.8)

## 2017-04-07 ENCOUNTER — Ambulatory Visit (INDEPENDENT_AMBULATORY_CARE_PROVIDER_SITE_OTHER): Payer: Medicare HMO | Admitting: Family Medicine

## 2017-04-07 ENCOUNTER — Encounter: Payer: Self-pay | Admitting: Family Medicine

## 2017-04-07 VITALS — BP 155/57 | HR 67 | Temp 97.0°F | Ht 66.5 in | Wt 191.0 lb

## 2017-04-07 DIAGNOSIS — E785 Hyperlipidemia, unspecified: Secondary | ICD-10-CM | POA: Diagnosis not present

## 2017-04-07 DIAGNOSIS — I441 Atrioventricular block, second degree: Secondary | ICD-10-CM

## 2017-04-07 DIAGNOSIS — R7303 Prediabetes: Secondary | ICD-10-CM

## 2017-04-07 DIAGNOSIS — D72829 Elevated white blood cell count, unspecified: Secondary | ICD-10-CM

## 2017-04-07 DIAGNOSIS — E875 Hyperkalemia: Secondary | ICD-10-CM

## 2017-04-07 NOTE — Progress Notes (Signed)
HPI  Patient presents today for routine follow-up.  Patient feels well and has no complaints.  He has myasthenia gravis followed by neurology in [place].  He reports good medication compliance and pravastatin.  He denies any side effects from medications.  He denies any palpitations.  PMH: Smoking status noted ROS: Per HPI  Objective: BP (!) 155/57   Pulse 67   Temp (!) 97 F (36.1 C) (Oral)   Ht 5' 6.5" (1.689 m)   Wt 191 lb (86.6 kg)   BMI 30.37 kg/m  Gen: NAD, alert, cooperative with exam HEENT: NCAT CV: Re-/6 systolic murmur, irregularly irregular. Resp: CTABL, no wheezes, non-labored Ext: No edema, warm Neuro: Alert and oriented, No gross deficits  Assessment and plan:  #Irregular heartbeat - mobitz Type 2  New onset EKG shows second-degree type II heart block, recommended cardiology referral which she is agreeable to. Repeat potassium  #Hyperkalemia Mild, slightly reduced renal function, repeat BMP Asymptomatic  #Leukocytosis Unclear etiology Repeat labs, smear as well  #Hyperlipidemia LDL slightly up, however cholesterol overall controlled, no changes  Prediabetes-A1c controlled, no changes    Orders Placed This Encounter  Procedures  . Pathologist smear review  . BMP8+EGFR  . CBC with Differential/Platelet  . EKG 12-Lead     Sam , MD [facility] 04/07/2017, 9:22 AM     

## 2017-04-08 LAB — BMP8+EGFR
BUN / CREAT RATIO: 18 (ref 10–24)
BUN: 19 mg/dL (ref 8–27)
CALCIUM: 9.4 mg/dL (ref 8.6–10.2)
CHLORIDE: 101 mmol/L (ref 96–106)
CO2: 26 mmol/L (ref 20–29)
Creatinine, Ser: 1.07 mg/dL (ref 0.76–1.27)
GFR calc Af Amer: 76 mL/min/{1.73_m2} (ref >59)
GFR calc non Af Amer: 66 mL/min/{1.73_m2} (ref >59)
GLUCOSE: 76 mg/dL (ref 65–99)
Potassium: 4.5 mmol/L (ref 3.5–5.2)
Sodium: 145 mmol/L — ABNORMAL HIGH (ref 134–144)

## 2017-04-08 LAB — CBC WITH DIFFERENTIAL/PLATELET
BASOS ABS: 0.1 10*3/uL (ref 0.0–0.2)
BASOS: 0 %
EOS (ABSOLUTE): 0.1 10*3/uL (ref 0.0–0.4)
Eos: 1 %
HEMOGLOBIN: 14.7 g/dL (ref 13.0–17.7)
Hematocrit: 45.2 % (ref 37.5–51.0)
IMMATURE GRANS (ABS): 0 10*3/uL (ref 0.0–0.1)
IMMATURE GRANULOCYTES: 0 %
Lymphocytes Absolute: 2.2 10*3/uL (ref 0.7–3.1)
Lymphs: 17 %
MCH: 29.8 pg (ref 26.6–33.0)
MCHC: 32.5 g/dL (ref 31.5–35.7)
MCV: 92 fL (ref 79–97)
MONOCYTES: 7 %
Monocytes Absolute: 0.9 10*3/uL (ref 0.1–0.9)
NEUTROS ABS: 9.4 10*3/uL — AB (ref 1.4–7.0)
NEUTROS PCT: 75 %
PLATELETS: 285 10*3/uL (ref 150–379)
RBC: 4.94 x10E6/uL (ref 4.14–5.80)
RDW: 14 % (ref 12.3–15.4)
WBC: 12.7 10*3/uL — ABNORMAL HIGH (ref 3.4–10.8)

## 2017-04-13 LAB — PATHOLOGIST SMEAR REVIEW
BASOS ABS: 0.1 10*3/uL (ref 0.0–0.2)
Basos: 0 %
EOS (ABSOLUTE): 0.1 10*3/uL (ref 0.0–0.4)
Eos: 1 %
HEMATOCRIT: 43.9 % (ref 37.5–51.0)
Hemoglobin: 14.6 g/dL (ref 13.0–17.7)
IMMATURE GRANULOCYTES: 1 %
Immature Grans (Abs): 0.1 10*3/uL (ref 0.0–0.1)
LYMPHS: 17 %
Lymphocytes Absolute: 2.4 10*3/uL (ref 0.7–3.1)
MCH: 29.8 pg (ref 26.6–33.0)
MCHC: 33.3 g/dL (ref 31.5–35.7)
MCV: 90 fL (ref 79–97)
MONOS ABS: 1 10*3/uL — AB (ref 0.1–0.9)
Monocytes: 7 %
NEUTROS PCT: 74 %
Neutrophils Absolute: 10.1 10*3/uL — ABNORMAL HIGH (ref 1.4–7.0)
Path Rev PLTs: NORMAL
Path Rev RBC: NORMAL
Platelets: 300 10*3/uL (ref 150–379)
RBC: 4.9 x10E6/uL (ref 4.14–5.80)
RDW: 13.9 % (ref 12.3–15.4)
WBC: 13.6 10*3/uL — ABNORMAL HIGH (ref 3.4–10.8)

## 2017-05-01 ENCOUNTER — Ambulatory Visit: Payer: Medicare HMO | Admitting: Cardiovascular Disease

## 2017-05-01 ENCOUNTER — Encounter: Payer: Self-pay | Admitting: Cardiovascular Disease

## 2017-05-01 VITALS — BP 134/76 | HR 72 | Ht 66.0 in | Wt 190.0 lb

## 2017-05-01 DIAGNOSIS — R011 Cardiac murmur, unspecified: Secondary | ICD-10-CM

## 2017-05-01 DIAGNOSIS — I4430 Unspecified atrioventricular block: Secondary | ICD-10-CM

## 2017-05-01 NOTE — Patient Instructions (Signed)
Your physician recommends that you schedule a follow-up appointment in: 2 months with [place]     Your physician has recommended that you wear an event monitor for 21 days. Event monitors are medical devices that record the heart's electrical activity. Doctors most often [place] these monitors to diagnose arrhythmias. Arrhythmias are problems with the speed or rhythm of the heartbeat. The monitor is a small, portable device. You can wear one while you do your normal daily activities. This is usually used to diagnose what is causing palpitations/syncope (passing out).    Your physician has requested that you have an echocardiogram. Echocardiography is a painless test that uses sound waves to create images of your heart. It provides your doctor with information about the size and shape of your heart and how well your heart's chambers and valves are working. This procedure takes approximately one hour. There are no restrictions for this procedure.      Your physician recommends that you continue on your current medications as directed. Please refer to the Current Medication list given to you today.     No lab work today     Thank you for [place] !

## 2017-05-01 NOTE — Progress Notes (Signed)
CARDIOLOGY CONSULT NOTE  Patient ID: Duane Burns MRN: [id number] DOB/AGE: Mar 18, 1935 82 y.o.  Admit date: (Not on file) Primary Physician: Timmothy Euler, MD Referring Physician: Dr. Wendi Snipes  Reason for Consultation: Arrhythmia  HPI: Duane Burns is a 82 y.o. male who is being seen today for the evaluation of arrhythmia at the request of Timmothy Euler, MD.  Past medical history includes myasthenia gravis and hyperlipidemia.  I reviewed an ECG performed on 04/07/17 which demonstrated sinus bradycardia, 55 bpm, with what appears to be possibly intermittent 2:1 AV block.  RR intervals appear to be equal during nonconducted beats.  I compared this to an ECG performed on 11/10/14 which demonstrated sinus rhythm.  He appears to be asymptomatic from this and denies chest pain, shortness of breath, lightheadedness, dizziness, palpitations, leg swelling, and syncope.  He has been on pyridostigmine for at least 1-2 years and is followed by Dr. Merlene Laughter.  He has undergone 4 back surgeries and developed left foot drop after his most recent back surgery 8-9 years ago.  He and his wife have foster 24 kids.  They have 5 grandchildren and 5 great-grandchildren.  Family history: Their daughter underwent aortic valve replacement at the age of 67.    No Known Allergies  Current Outpatient Medications  Medication Sig Dispense Refill  . aspirin EC 81 MG tablet Take 81 mg by mouth daily.    . cetirizine (ZYRTEC) 10 MG tablet Take 10 mg by mouth at bedtime.    . cholecalciferol (VITAMIN D) 1000 UNITS tablet Take 1,000 Units by mouth daily.    . Multiple Vitamin (MULTIVITAMIN WITH MINERALS) TABS tablet Take 1 tablet by mouth daily.    . Omega-3 Fatty Acids (FISH OIL) 1200 MG CAPS Take 1-2 capsules (1,200-2,400 mg total) by mouth 2 (two) times daily. RESTART IN 1 WEEK IF YOUR SWALLOWING CONTINUES TO IMPROVE. Take 2 capsules in the morning and 1 capsule in the evening    .  pravastatin (PRAVACHOL) 80 MG tablet TAKE 1 TABLET EVERY EVENING 90 tablet 0  . predniSONE (DELTASONE) 10 MG tablet Take 10 mg by mouth daily.  5  . pyridostigmine (MESTINON) 60 MG tablet Take 60 mg by mouth 3 (three) times daily.      No current facility-administered medications for this visit.     Past Medical History:  Diagnosis Date  . Arthritis   . Cancer [facility])    History of skin cancer / hx Prostate cancer  . Dysphagia 11/10/2014  . Dysphonia 11/11/2014  . Foot drop, left    DUE TO NERVE INJURY IN BACK PER PT  . HNP (herniated nucleus pulposus with myelopathy), thoracic    HISTORY OF HNP  . Hyperlipidemia   . Hypertension   . Myasthenia gravis ([place]) 11/12/2014   versus Guillain-Barr syndrome.  . Pneumonia    JAN 2016  . Urgency of urination   . Urinary leakage     Past Surgical History:  Procedure Laterality Date  . BACK SURGERY     X 3  . CYSTOSCOPY WITH URETHRAL DILATATION N/A 05/10/2014   Procedure: CYSTOSCOPY WITH URETHRAL DILATATION;  Surgeon: Alexis Frock, MD;  Location: [facility];  Service: Urology;  Laterality: N/A;  with catheter placement, prior to surgery. procedure performed on the stretcher.  Marland Kitchen PROSTATE SURGERY    . SKIN CANCER EXCISION    . TONSILLECTOMY    . TOTAL HIP ARTHROPLASTY Right 05/10/2014   Procedure: RIGHT TOTAL HIP  ARTHROPLASTY ANTERIOR APPROACH ;  Surgeon: Paralee Cancel, MD;  Location: [facility];  Service: Orthopedics;  Laterality: Right;    Social History   Socioeconomic History  . Marital status: Married    Spouse name: Not on file  . Number of children: Not on file  . Years of education: Not on file  . Highest education level: Not on file  Social Needs  . Financial resource strain: Not on file  . Food insecurity - worry: Not on file  . Food insecurity - inability: Not on file  . Transportation needs - medical: Not on file  . Transportation needs - non-medical: Not on file  Occupational History  . Not on file  Tobacco Use  . Smoking  status: Former Smoker    Last attempt to quit: 05/05/1994    Years since quitting: 23.0  . Smokeless tobacco: Never Used  Substance and Sexual Activity  . Alcohol use: No  . Drug use: No  . Sexual activity: Not Currently  Other Topics Concern  . Not on file  Social History Narrative  . Not on file     Current Meds  Medication Sig  . aspirin EC 81 MG tablet Take 81 mg by mouth daily.  . cetirizine (ZYRTEC) 10 MG tablet Take 10 mg by mouth at bedtime.  . cholecalciferol (VITAMIN D) 1000 UNITS tablet Take 1,000 Units by mouth daily.  . Multiple Vitamin (MULTIVITAMIN WITH MINERALS) TABS tablet Take 1 tablet by mouth daily.  . Omega-3 Fatty Acids (FISH OIL) 1200 MG CAPS Take 1-2 capsules (1,200-2,400 mg total) by mouth 2 (two) times daily. RESTART IN 1 WEEK IF YOUR SWALLOWING CONTINUES TO IMPROVE. Take 2 capsules in the morning and 1 capsule in the evening  . pravastatin (PRAVACHOL) 80 MG tablet TAKE 1 TABLET EVERY EVENING  . predniSONE (DELTASONE) 10 MG tablet Take 10 mg by mouth daily.  Marland Kitchen pyridostigmine (MESTINON) 60 MG tablet Take 60 mg by mouth 3 (three) times daily.       Review of systems complete and found to be negative unless listed above in HPI    Physical exam Blood pressure 134/76, pulse 72, height 5\' 6"  (1.676 m), weight 190 lb (86.2 kg), SpO2 96 %. General: NAD Neck: No JVD, no thyromegaly or thyroid nodule.  Lungs: Clear to auscultation bilaterally with normal respiratory effort. CV: Nondisplaced PMI. Regular rate and rhythm, normal S1/S2, no S3/S4, 3/6 ejection systolic murmur loudest over right upper sternal border.  No peripheral edema.  No carotid bruit.    Abdomen: Soft, nontender, no distention.  Skin: Intact without lesions or rashes.  Neurologic: Alert and oriented x 3.  Psych: Normal affect. Extremities: No clubbing or cyanosis.  HEENT: Normal.   ECG: Most recent ECG reviewed.   Labs: Lab Results  Component Value Date/Time   K 4.5 04/07/2017 09:47  AM   BUN 19 04/07/2017 09:47 AM   CREATININE 1.07 04/07/2017 09:47 AM   ALT 22 04/02/2017 10:59 AM   TSH 1.690 11/27/2016 11:50 AM   HGB 14.6 04/07/2017 09:47 AM   HGB 14.7 04/07/2017 09:47 AM     Lipids: Lab Results  Component Value Date/Time   LDLCALC 116 (H) 04/02/2017 10:59 AM   CHOL 189 04/02/2017 10:59 AM   TRIG 72 04/02/2017 10:59 AM   HDL 59 04/02/2017 10:59 AM        ASSESSMENT AND PLAN:  1.  Intermittent 2:1 AV block: He appears to be asymptomatic from this.  I will obtain  a 3-week event monitor to assess for conduction block frequency as well as possible symptoms.  This is likely due to myasthenia gravis vs pyridostigmine itself.  I would defer to neurology regarding alternative agents over pyridostigmine if deemed feasible.  2.  Cardiac murmur: He has a murmur loudest over the aortic area suggestive of aortic stenosis.  It is interesting to note that their daughter underwent aortic valve replacement at the age of 69, leading me to believe that she may have had bicuspid aortic valve stenosis.  Given that this is congenital, I wonder if he too has bicuspid aortic valve stenosis.  If this is the case, he will also require CTA versus MRA to characterize the thoracic aorta given that it is an aortopathy.  I will obtain an echocardiogram for further clarification.   Disposition: Follow up in 2 months  Signed: Kate Sable, M.D., F.A.C.C.  05/01/2017, 8:42 AM

## 2017-05-06 ENCOUNTER — Ambulatory Visit (HOSPITAL_COMMUNITY)
Admission: RE | Admit: 2017-05-06 | Discharge: 2017-05-06 | Disposition: A | Discharge status: Home / Self Care | Payer: Medicare HMO | Source: Ambulatory Visit | Attending: Cardiovascular Disease | Admitting: Cardiovascular Disease

## 2017-05-06 DIAGNOSIS — E785 Hyperlipidemia, unspecified: Secondary | ICD-10-CM | POA: Diagnosis not present

## 2017-05-06 DIAGNOSIS — I351 Nonrheumatic aortic (valve) insufficiency: Secondary | ICD-10-CM | POA: Diagnosis not present

## 2017-05-06 DIAGNOSIS — R011 Cardiac murmur, unspecified: Secondary | ICD-10-CM | POA: Insufficient documentation

## 2017-05-06 DIAGNOSIS — Z8546 Personal history of malignant neoplasm of prostate: Secondary | ICD-10-CM | POA: Insufficient documentation

## 2017-05-06 DIAGNOSIS — I10 Essential (primary) hypertension: Secondary | ICD-10-CM | POA: Insufficient documentation

## 2017-05-06 NOTE — Progress Notes (Signed)
*  PRELIMINARY RESULTS* Echocardiogram 2D Echocardiogram has been performed.  Leavy Cella 05/06/2017, 1:45 PM

## 2017-05-08 ENCOUNTER — Telehealth: Payer: Self-pay

## 2017-05-08 DIAGNOSIS — I350 Nonrheumatic aortic (valve) stenosis: Secondary | ICD-10-CM

## 2017-05-08 NOTE — Telephone Encounter (Signed)
-----   Message from Herminio Commons, MD sent at 05/06/2017  3:21 PM EDT ----- Normal pumping function with moderate to severe aortic valve narrowing as suspected by physical exam.  This should be repeated in 6-12 months. Please obtain CT angiogram of aorta to evaluate for aneurysm.

## 2017-05-08 NOTE — Telephone Encounter (Signed)
Pt notified of results, will schedule chest CT and pick up lab slip for BMET

## 2017-05-13 DIAGNOSIS — Z833 Family history of diabetes mellitus: Secondary | ICD-10-CM | POA: Diagnosis not present

## 2017-05-13 DIAGNOSIS — G7000 Myasthenia gravis without (acute) exacerbation: Secondary | ICD-10-CM | POA: Diagnosis not present

## 2017-05-13 DIAGNOSIS — Z87891 Personal history of nicotine dependence: Secondary | ICD-10-CM | POA: Diagnosis not present

## 2017-05-13 DIAGNOSIS — Z7952 Long term (current) use of systemic steroids: Secondary | ICD-10-CM | POA: Diagnosis not present

## 2017-05-13 DIAGNOSIS — Z823 Family history of stroke: Secondary | ICD-10-CM | POA: Diagnosis not present

## 2017-05-13 DIAGNOSIS — E785 Hyperlipidemia, unspecified: Secondary | ICD-10-CM | POA: Diagnosis not present

## 2017-05-13 DIAGNOSIS — R030 Elevated blood-pressure reading, without diagnosis of hypertension: Secondary | ICD-10-CM | POA: Diagnosis not present

## 2017-05-14 ENCOUNTER — Encounter (INDEPENDENT_AMBULATORY_CARE_PROVIDER_SITE_OTHER): Payer: Medicare HMO

## 2017-05-14 DIAGNOSIS — I455 Other specified heart block: Secondary | ICD-10-CM | POA: Diagnosis not present

## 2017-05-14 DIAGNOSIS — I4430 Unspecified atrioventricular block: Secondary | ICD-10-CM | POA: Diagnosis not present

## 2017-05-21 ENCOUNTER — Ambulatory Visit (HOSPITAL_COMMUNITY)
Admission: RE | Admit: 2017-05-21 | Discharge: 2017-05-21 | Disposition: A | Discharge status: Home / Self Care | Payer: Medicare HMO | Source: Ambulatory Visit | Attending: Cardiovascular Disease | Admitting: Cardiovascular Disease

## 2017-05-21 DIAGNOSIS — E041 Nontoxic single thyroid nodule: Secondary | ICD-10-CM | POA: Diagnosis not present

## 2017-05-21 DIAGNOSIS — I2510 Atherosclerotic heart disease of native coronary artery without angina pectoris: Secondary | ICD-10-CM | POA: Diagnosis not present

## 2017-05-21 DIAGNOSIS — I350 Nonrheumatic aortic (valve) stenosis: Secondary | ICD-10-CM | POA: Diagnosis present

## 2017-05-21 DIAGNOSIS — I77810 Thoracic aortic ectasia: Secondary | ICD-10-CM | POA: Insufficient documentation

## 2017-05-21 DIAGNOSIS — I700 Atherosclerosis of aorta: Secondary | ICD-10-CM | POA: Insufficient documentation

## 2017-05-21 LAB — POCT I-STAT CREATININE: Creatinine, Ser: 1 mg/dL (ref 0.61–1.24)

## 2017-05-21 MED ORDER — IOPAMIDOL (ISOVUE-370) INJECTION 76%
100.0000 mL | Freq: Once | INTRAVENOUS | Status: AC | PRN
  Administered 2017-05-21: 100 mL via INTRAVENOUS

## 2017-05-22 ENCOUNTER — Other Ambulatory Visit: Payer: Self-pay

## 2017-05-22 DIAGNOSIS — E041 Nontoxic single thyroid nodule: Secondary | ICD-10-CM

## 2017-06-01 ENCOUNTER — Other Ambulatory Visit: Payer: Self-pay | Admitting: Family Medicine

## 2017-06-02 ENCOUNTER — Ambulatory Visit (HOSPITAL_COMMUNITY): Admission: RE | Admit: 2017-06-02 | Payer: Medicare HMO | Source: Ambulatory Visit

## 2017-06-19 ENCOUNTER — Ambulatory Visit (HOSPITAL_COMMUNITY)
Admission: RE | Admit: 2017-06-19 | Discharge: 2017-06-19 | Disposition: A | Discharge status: Home / Self Care | Payer: Medicare HMO | Source: Ambulatory Visit | Attending: Family Medicine | Admitting: Family Medicine

## 2017-06-19 DIAGNOSIS — E041 Nontoxic single thyroid nodule: Secondary | ICD-10-CM | POA: Diagnosis not present

## 2017-06-23 ENCOUNTER — Telehealth: Payer: Self-pay | Admitting: Family Medicine

## 2017-06-23 NOTE — Telephone Encounter (Signed)
Pt aware of results 

## 2017-07-03 ENCOUNTER — Encounter: Payer: Self-pay | Admitting: Cardiovascular Disease

## 2017-07-03 ENCOUNTER — Ambulatory Visit: Payer: Medicare HMO | Admitting: Cardiovascular Disease

## 2017-07-03 VITALS — BP 120/64 | HR 70 | Ht 69.0 in | Wt 190.0 lb

## 2017-07-03 DIAGNOSIS — I77810 Thoracic aortic ectasia: Secondary | ICD-10-CM

## 2017-07-03 DIAGNOSIS — I350 Nonrheumatic aortic (valve) stenosis: Secondary | ICD-10-CM | POA: Diagnosis not present

## 2017-07-03 DIAGNOSIS — I4430 Unspecified atrioventricular block: Secondary | ICD-10-CM

## 2017-07-03 NOTE — Patient Instructions (Signed)
Your physician wants you to follow-up in: 6 months with Dr.Koneswaran You will receive a reminder letter in the mail two months in advance. If you don't receive a letter, please call our office to schedule the follow-up appointment.    Your physician recommends that you continue on your current medications as directed. Please refer to the Current Medication list given to you today.   If you need a refill on your cardiac medications before your next appointment, please call your pharmacy.     No lab work or tests  Ordered today      Thank you for choosing [facility] !

## 2017-07-03 NOTE — Progress Notes (Signed)
SUBJECTIVE: The patient returns for follow-up after undergoing cardiovascular testing performed for the evaluation of arrhythmia and cardiac murmur.  Past medical history includes myasthenia gravis and hyperlipidemia.  He has undergone 4 back surgeries and developed left foot drop after his most recent back surgery 8-9 years ago.  Cardiac monitoring demonstrated sinus rhythm with PACs and occasionally blocked PACs.  Echocardiogram demonstrated normal left ventricular systolic function, LVEF 60 to 65%, mild LVH, grade 1 diastolic dysfunction, and moderate to severe aortic stenosis, mean gradient 26 mmHg.  I ordered a chest CT with angiography to evaluate for aortopathy once aortic stenosis was discovered.  The aortic root was mildly dilated at 4.2 cm.  Coronary artery calcifications were also noted.  The patient denies any symptoms of chest pain, palpitations, shortness of breath, lightheadedness, dizziness, leg swelling, orthopnea, PND, and syncope.  He stays very active chopping trees, mowing his lawn, weed eating, and fixing cars.  Social Hx: He and his wife have foster 60 kids.  They have 5 grandchildren and 5 great-grandchildren.  His wife met him when she was 78, got engaged at 11, and got married at the age of 22.  They will have been married 76 years in July 2019.   Family history: Their daughter underwent aortic valve replacement at the age of 21.   Review of Systems: As per "subjective", otherwise negative.  No Known Allergies  Current Outpatient Medications  Medication Sig Dispense Refill  . aspirin EC 81 MG tablet Take 81 mg by mouth daily.    . cetirizine (ZYRTEC) 10 MG tablet Take 10 mg by mouth at bedtime.    . cholecalciferol (VITAMIN D) 1000 UNITS tablet Take 1,000 Units by mouth daily.    . Multiple Vitamin (MULTIVITAMIN WITH MINERALS) TABS tablet Take 1 tablet by mouth daily.    . Omega-3 Fatty Acids (FISH OIL) 1200 MG CAPS Take 1-2 capsules (1,200-2,400 mg  total) by mouth 2 (two) times daily. RESTART IN 1 WEEK IF YOUR SWALLOWING CONTINUES TO IMPROVE. Take 2 capsules in the morning and 1 capsule in the evening    . pravastatin (PRAVACHOL) 80 MG tablet TAKE 1 TABLET EVERY EVENING 90 tablet 0  . predniSONE (DELTASONE) 10 MG tablet Take 10 mg by mouth daily.  5  . pyridostigmine (MESTINON) 60 MG tablet Take 60 mg by mouth 3 (three) times daily.      No current facility-administered medications for this visit.     Past Medical History:  Diagnosis Date  . Arthritis   . Cancer [facility])    History of skin cancer / hx Prostate cancer  . Dysphagia 11/10/2014  . Dysphonia 11/11/2014  . Foot drop, left    DUE TO NERVE INJURY IN BACK PER PT  . HNP (herniated nucleus pulposus with myelopathy), thoracic    HISTORY OF HNP  . Hyperlipidemia   . Hypertension   . Myasthenia gravis ([place]) 11/12/2014   versus Guillain-Barr syndrome.  . Pneumonia    JAN 2016  . Urgency of urination   . Urinary leakage     Past Surgical History:  Procedure Laterality Date  . BACK SURGERY     X 3  . CYSTOSCOPY WITH URETHRAL DILATATION N/A 05/10/2014   Procedure: CYSTOSCOPY WITH URETHRAL DILATATION;  Surgeon: Alexis Frock, MD;  Location: [facility];  Service: Urology;  Laterality: N/A;  with catheter placement, prior to surgery. procedure performed on the stretcher.  Marland Kitchen PROSTATE SURGERY    . SKIN CANCER EXCISION    .  TONSILLECTOMY    . TOTAL HIP ARTHROPLASTY Right 05/10/2014   Procedure: RIGHT TOTAL HIP ARTHROPLASTY ANTERIOR APPROACH ;  Surgeon: Paralee Cancel, MD;  Location: [facility];  Service: Orthopedics;  Laterality: Right;    Social History   Socioeconomic History  . Marital status: Married    Spouse name: Not on file  . Number of children: Not on file  . Years of education: Not on file  . Highest education level: Not on file  Occupational History  . Not on file  Social Needs  . Financial resource strain: Not on file  . Food insecurity:    Worry: Not on file     Inability: Not on file  . Transportation needs:    Medical: Not on file    Non-medical: Not on file  Tobacco Use  . Smoking status: Former Smoker    Last attempt to quit: 05/05/1994    Years since quitting: 23.1  . Smokeless tobacco: Never Used  Substance and Sexual Activity  . Alcohol use: No  . Drug use: No  . Sexual activity: Not Currently  Lifestyle  . Physical activity:    Days per week: Not on file    Minutes per session: Not on file  . Stress: Not on file  Relationships  . Social connections:    Talks on phone: Not on file    Gets together: Not on file    Attends religious service: Not on file    Active member of club or organization: Not on file    Attends meetings of clubs or organizations: Not on file    Relationship status: Not on file  . Intimate partner violence:    Fear of current or ex partner: Not on file    Emotionally abused: Not on file    Physically abused: Not on file    Forced sexual activity: Not on file  Other Topics Concern  . Not on file  Social History Narrative  . Not on file     Vitals:   07/03/17 1050  BP: 120/64  Pulse: 70  SpO2: 95%  Weight: 190 lb (86.2 kg)  Height: 5' 9"  (1.753 m)    Wt Readings from Last 3 Encounters:  07/03/17 190 lb (86.2 kg)  05/01/17 190 lb (86.2 kg)  04/07/17 191 lb (86.6 kg)     PHYSICAL EXAM General: NAD HEENT: Normal. Neck: No JVD, no thyromegaly. Lungs: Clear to auscultation bilaterally with normal respiratory effort. CV: Regular rate and rhythm, normal S1/S2, no S3/S4, 3/6 ejection systolic murmur loudest over right upper sternal border. No pretibial or periankle edema.   Abdomen: Soft, nontender, no distention.  Neurologic: Alert and oriented.  Psych: Normal affect. Skin: Normal. Musculoskeletal: No gross deformities.    ECG: Most recent ECG reviewed.   Labs: Lab Results  Component Value Date/Time   K 4.5 04/07/2017 09:47 AM   BUN 19 04/07/2017 09:47 AM   CREATININE 1.00  05/21/2017 04:23 PM   ALT 22 04/02/2017 10:59 AM   TSH 1.690 11/27/2016 11:50 AM   HGB 14.6 04/07/2017 09:47 AM   HGB 14.7 04/07/2017 09:47 AM     Lipids: Lab Results  Component Value Date/Time   LDLCALC 116 (H) 04/02/2017 10:59 AM   CHOL 189 04/02/2017 10:59 AM   TRIG 72 04/02/2017 10:59 AM   HDL 59 04/02/2017 10:59 AM       ASSESSMENT AND PLAN: 1.  Intermittent 2-1 AV block: Event monitoring results detailed above with sinus rhythm and  PACs and occasionally blocked PACs.  He is asymptomatic. This is likely due to myasthenia gravis vs pyridostigmine itself.  I would defer to neurology regarding alternative agents over pyridostigmine if deemed feasible.  2.  Moderate to severe aortic stenosis: Symptomatically stable.  I will repeat an echocardiogram in 1 year.  3.  Mild aortic root dilatation: 4.2 cm in diameter.  I will repeat imaging in 1 year.   Disposition: Follow up 6 months   Kate Sable, M.D., F.A.C.C.

## 2017-07-16 ENCOUNTER — Other Ambulatory Visit: Payer: Self-pay | Admitting: Neurology

## 2017-07-16 DIAGNOSIS — M5412 Radiculopathy, cervical region: Secondary | ICD-10-CM | POA: Diagnosis not present

## 2017-07-16 DIAGNOSIS — G603 Idiopathic progressive neuropathy: Secondary | ICD-10-CM | POA: Diagnosis not present

## 2017-07-16 DIAGNOSIS — D519 Vitamin B12 deficiency anemia, unspecified: Secondary | ICD-10-CM | POA: Diagnosis not present

## 2017-07-16 DIAGNOSIS — G7000 Myasthenia gravis without (acute) exacerbation: Secondary | ICD-10-CM | POA: Diagnosis not present

## 2017-07-25 ENCOUNTER — Ambulatory Visit (HOSPITAL_COMMUNITY): Payer: Medicare HMO

## 2017-08-01 ENCOUNTER — Ambulatory Visit (HOSPITAL_COMMUNITY)
Admission: RE | Admit: 2017-08-01 | Discharge: 2017-08-01 | Disposition: A | Discharge status: Home / Self Care | Payer: Medicare HMO | Source: Ambulatory Visit | Attending: Neurology | Admitting: Neurology

## 2017-08-01 DIAGNOSIS — M4802 Spinal stenosis, cervical region: Secondary | ICD-10-CM | POA: Diagnosis not present

## 2017-08-01 DIAGNOSIS — M5412 Radiculopathy, cervical region: Secondary | ICD-10-CM | POA: Insufficient documentation

## 2017-08-01 DIAGNOSIS — M4312 Spondylolisthesis, cervical region: Secondary | ICD-10-CM | POA: Insufficient documentation

## 2017-08-04 ENCOUNTER — Encounter: Payer: Self-pay | Admitting: Family Medicine

## 2017-08-04 ENCOUNTER — Ambulatory Visit (INDEPENDENT_AMBULATORY_CARE_PROVIDER_SITE_OTHER): Payer: Medicare HMO

## 2017-08-04 ENCOUNTER — Ambulatory Visit (INDEPENDENT_AMBULATORY_CARE_PROVIDER_SITE_OTHER): Payer: Medicare HMO | Admitting: Family Medicine

## 2017-08-04 VITALS — BP 150/85 | HR 78 | Temp 98.0°F | Ht 68.0 in | Wt 186.0 lb

## 2017-08-04 DIAGNOSIS — R059 Cough, unspecified: Secondary | ICD-10-CM

## 2017-08-04 DIAGNOSIS — J9801 Acute bronchospasm: Secondary | ICD-10-CM

## 2017-08-04 DIAGNOSIS — R05 Cough: Secondary | ICD-10-CM

## 2017-08-04 DIAGNOSIS — R0602 Shortness of breath: Secondary | ICD-10-CM

## 2017-08-04 MED ORDER — ALBUTEROL SULFATE HFA 108 (90 BASE) MCG/ACT IN AERS: 2.0000 | INHALATION_SPRAY | Freq: Four times a day (QID) | RESPIRATORY_TRACT | 0 refills | Status: DC | PRN

## 2017-08-04 MED ORDER — BENZONATATE 200 MG PO CAPS: 200.0000 mg | ORAL_CAPSULE | Freq: Two times a day (BID) | ORAL | 0 refills | Status: DC | PRN

## 2017-08-04 NOTE — Patient Instructions (Addendum)
There is no evidence of pneumonia on your chest x-ray today.  However, I do think that you are having bronchospasm related to a virus.  For this reason, I have prescribed you albuterol to use 2 puffs every 6 hours as needed for shortness of breath.  I would prefer that you use this scheduled every 6 hours for the next 2 days and then use it if needed.  I have sent you in a cough medication to use twice a day if needed.  If your symptoms worsen or your fever comes back, please contact our office and I will send in an antibiotic.  You may continue the Mucinex for chest decongestion.  It appears that you have a viral upper respiratory infection (cold).  Cold symptoms can last up to 2 weeks.  I recommend that you only use cold medications that are safe in high blood pressure like Coricidin (generic is fine).  Other cold medications can increase your blood pressure.    - Get plenty of rest and drink plenty of fluids. - Try to breathe moist air. Use a cold mist humidifier. - Consume warm fluids (soup or tea) to provide relief for a stuffy nose and to loosen phlegm. - For nasal stuffiness, try saline nasal spray or a Neti Pot.  Afrin nasal spray can also be used but this product should not be used longer than 3 days or it will cause rebound nasal stuffiness (worsening nasal congestion). - For sore throat pain relief: use chloraseptic spray, suck on throat lozenges, hard candy or popsicles; gargle with warm salt water (1/4 tsp. salt per 8 oz. of water); and eat soft, bland foods. - Eat a well-balanced diet. If you cannot, ensure you are getting enough nutrients by taking a daily multivitamin. - Avoid dairy products, as they can thicken phlegm. - Avoid alcohol, as it impairs your body's immune system.  CONTACT YOUR DOCTOR IF YOU EXPERIENCE ANY OF THE FOLLOWING: - High fever - Ear pain - Sinus-type headache - Unusually severe cold symptoms - Cough that gets worse while other cold symptoms improve -  Flare up of any chronic lung problem, such as asthma - Your symptoms persist longer than 2 weeks    Bronchospasm, Adult Bronchospasm is when airways in the lungs get smaller. When this happens, it can be hard to breathe. You may cough. You may also make a whistling sound when you breathe (wheeze). Follow these instructions at home: Medicines  Take over-the-counter and prescription medicines only as told by your doctor.  If you need to use an inhaler or nebulizer to take your medicine, ask your doctor how to use it.  If you were given a spacer, always use it with your inhaler. Lifestyle  Change your heating and air conditioning filter. Do this at least once a month.  Try not to use fireplaces and wood stoves.  Do not  smoke. Do not  allow smoking in your home.  Try not to use things that have a strong smell, like perfume.  Get rid of pests (such as roaches and mice) and their poop.  Remove any mold from your home.  Keep your house clean. Get rid of dust.  Use cleaning products that have no smell.  Replace carpet with wood, tile, or vinyl flooring.  Use allergy-proof pillows, mattress covers, and box spring covers.  Wash bed sheets and blankets every week. Use hot water. Dry them in a dryer.  Use blankets that are made of polyester or cotton.  Wash your hands often.  Keep pets out of your bedroom.  When you exercise, try not to breathe in cold air. General instructions  Have a plan for getting medical care. Know these things: ? When to call your doctor. ? When to call local emergency services (911 in the [place] ? Where to go in an emergency.  Stay up to date on your shots (immunizations).  When you have an episode: ? Stay calm. ? Relax. ? Breathe slowly. Contact a doctor if:  Your muscles ache.  Your chest hurts.  The color of the mucus you cough up (sputum) changes from clear or white to yellow, green, gray, or bloody.  The mucus you cough up gets  thicker.  You have a fever. Get help right away if:  The whistling sound gets worse, even after you take your medicines.  Your coughing gets worse.  You find it even harder to breathe.  Your chest hurts very much. Summary  Bronchospasm is when airways in the lungs get smaller.  When this happens, it can be hard to breathe. You may cough. You may also make a whistling sound when you breathe.  Stay away from things that cause you to have episodes. These include smoke or dust. This information is not intended to replace advice given to you by your health care provider. Make sure you discuss any questions you have with your health care provider. Document Released: 12/09/2008 Document Revised: 02/15/2016 Document Reviewed: 02/15/2016 Elsevier Interactive Patient Education  2017 [employer].

## 2017-08-04 NOTE — Progress Notes (Signed)
Subjective: CC: URI PCP: Timmothy Euler, MD Duane Burns is a 82 y.o. male presenting to clinic today for:  1. Febrile illness Patient reports a 2-day history of fever and productive cough.  He reports T-max was 100.6 F.  He has had new onset shortness of breath as well.  He has been using Mucinex extra strength and has had 2 doses of amoxicillin from his wife.  He reports an associated headache.  No nausea, vomiting, chest pain, heart palpitations.  His wife was sick with a URI recently.  Past medical history is significant for myasthenia gravis.  He is currently treated with prednisone 10 mg daily.   ROS: Per HPI  No Known Allergies Past Medical History:  Diagnosis Date  . Arthritis   . Cancer [facility])    History of skin cancer / hx Prostate cancer  . Dysphagia 11/10/2014  . Dysphonia 11/11/2014  . Foot drop, left    DUE TO NERVE INJURY IN BACK PER PT  . HNP (herniated nucleus pulposus with myelopathy), thoracic    HISTORY OF HNP  . Hyperlipidemia   . Hypertension   . Myasthenia gravis ([place]) 11/12/2014   versus Guillain-Barr syndrome.  . Pneumonia    JAN 2016  . Urgency of urination   . Urinary leakage     Current Outpatient Medications:  .  aspirin EC 81 MG tablet, Take 81 mg by mouth daily., Disp: , Rfl:  .  cetirizine (ZYRTEC) 10 MG tablet, Take 10 mg by mouth at bedtime., Disp: , Rfl:  .  cholecalciferol (VITAMIN D) 1000 UNITS tablet, Take 1,000 Units by mouth daily., Disp: , Rfl:  .  Multiple Vitamin (MULTIVITAMIN WITH MINERALS) TABS tablet, Take 1 tablet by mouth daily., Disp: , Rfl:  .  Omega-3 Fatty Acids (FISH OIL) 1200 MG CAPS, Take 1-2 capsules (1,200-2,400 mg total) by mouth 2 (two) times daily. RESTART IN 1 WEEK IF YOUR SWALLOWING CONTINUES TO IMPROVE. Take 2 capsules in the morning and 1 capsule in the evening, Disp: , Rfl:  .  pravastatin (PRAVACHOL) 80 MG tablet, TAKE 1 TABLET EVERY EVENING, Disp: 90 tablet, Rfl: 0 .  predniSONE (DELTASONE) 10 MG  tablet, Take 10 mg by mouth daily., Disp: , Rfl: 5 .  pyridostigmine (MESTINON) 60 MG tablet, Take 60 mg by mouth 3 (three) times daily. , Disp: , Rfl:  Social History   Socioeconomic History  . Marital status: Married    Spouse name: Not on file  . Number of children: Not on file  . Years of education: Not on file  . Highest education level: Not on file  Occupational History  . Not on file  Social Needs  . Financial resource strain: Not on file  . Food insecurity:    Worry: Not on file    Inability: Not on file  . Transportation needs:    Medical: Not on file    Non-medical: Not on file  Tobacco Use  . Smoking status: Former Smoker    Last attempt to quit: 05/05/1994    Years since quitting: 23.2  . Smokeless tobacco: Never Used  Substance and Sexual Activity  . Alcohol use: No  . Drug use: No  . Sexual activity: Not Currently  Lifestyle  . Physical activity:    Days per week: Not on file    Minutes per session: Not on file  . Stress: Not on file  Relationships  . Social connections:    Talks on phone: Not  on file    Gets together: Not on file    Attends religious service: Not on file    Active member of club or organization: Not on file    Attends meetings of clubs or organizations: Not on file    Relationship status: Not on file  . Intimate partner violence:    Fear of current or ex partner: Not on file    Emotionally abused: Not on file    Physically abused: Not on file    Forced sexual activity: Not on file  Other Topics Concern  . Not on file  Social History Narrative  . Not on file   Family History  Problem Relation Age of Onset  . Diabetes Other   . Hypertension Other     Objective: Office vital signs reviewed. BP (!) 150/85   Pulse 78   Temp 98 F (36.7 C) (Oral)   Ht 5\' 8"  (1.727 m)   Wt 186 lb (84.4 kg)   SpO2 92%   BMI 28.28 kg/m   Physical Examination:  General: Awake, alert, nontoxic, No acute distress HEENT: Normal    Neck: No  masses palpated. No lymphadenopathy    Ears: Tympanic membranes intact, normal light reflex, no erythema, no bulging    Eyes:  sclera white, no ocular discharge    Nose: nasal turbinates moist, no nasal discharge    Throat: moist mucus membranes, no erythema.  Airway is patent Cardio: regular rate and rhythm, S1S2 heard, no murmurs appreciated Pulm: Global expiratory wheezes noted.  Air movement fair. No rhonchi or rales; normal work of breathing on room air  Dg Chest 2 View  Result Date: 08/04/2017 CLINICAL DATA:  Cough and shortness of breath. EXAM: CHEST - 2 VIEW COMPARISON:  CT chest dated May 21, 2017. Chest x-ray dated November 11, 2014. FINDINGS: The heart size and mediastinal contours are within normal limits. Atherosclerotic calcification of the aortic arch. Normal pulmonary vascularity. No focal consolidation, pleural effusion, or pneumothorax. Unchanged elevation of the right hemidiaphragm. No acute osseous abnormality. IMPRESSION: No active cardiopulmonary disease. Electronically Signed   By: Titus Dubin M.D.   On: 08/04/2017 09:37    Assessment/ Plan: 82 y.o. male   1. Cough in adult Patient is afebrile and nontoxic-appearing.  He has normal work of breathing on room air.  Cardiopulmonary exam was remarkable for global expiratory wheeze.  He is most certainly having a component of bronchospasm in the setting of what I suspect to be a viral URI.  Chest x-ray was obtained and demonstrated no acute pulmonary infiltrates.  Radiology confirmed no active cardiopulmonary disease.  I prescribed him an albuterol inhaler to use 2 puffs every 6 hours for the next 2 days scheduled then as needed as directed.  Tessalon Perles sent for cough. May continue Mucinex.  If fever were to return or symptoms to worsen, I instructed patient to call the office and I will send in an empiric antibiotic.  Home care instructions were reviewed with the patient.  A detailed AVS was provided to the  instructions may be reviewed with his wife is well.  Follow-up as needed. - DG Chest 2 View; Future  2. Shortness of breath - DG Chest 2 View; Future  3. Bronchospasm As above   Orders Placed This Encounter  Procedures  . DG Chest 2 View    Standing Status:   Future    Number of Occurrences:   1    Standing Expiration Date:   10/05/2018  Order Specific Question:   Reason for Exam (SYMPTOM  OR DIAGNOSIS REQUIRED)    Answer:   febrile illness    Order Specific Question:   Preferred imaging location?    Answer:   Internal    Order Specific Question:   Radiology Contrast Protocol - do NOT remove file path    Answer:   [email or web address]   Meds ordered this encounter  Medications  . albuterol (PROVENTIL HFA;VENTOLIN HFA) 108 (90 Base) MCG/ACT inhaler    Sig: Inhale 2 puffs into the lungs every 6 (six) hours as needed for wheezing or shortness of breath.    Dispense:  1 Inhaler    Refill:  0  . benzonatate (TESSALON) 200 MG capsule    Sig: Take 1 capsule (200 mg total) by mouth 2 (two) times daily as needed for cough.    Dispense:  20 capsule    Refill:  [place], DO [place] [phone]

## 2017-08-11 ENCOUNTER — Telehealth: Payer: Self-pay | Admitting: Family Medicine

## 2017-08-11 MED ORDER — AZITHROMYCIN 250 MG PO TABS: ORAL_TABLET | ORAL | 0 refills | Status: DC

## 2017-08-11 NOTE — Telephone Encounter (Signed)
See nursing note and last OV note. Azithro sent in.   Laroy Apple, MD [facility] 08/11/2017, 8:41 AM

## 2017-08-11 NOTE — Telephone Encounter (Signed)
Pt wife has called stating that the pt is still coughing and coughing up some stuff, states that he was seen last Monday and was told if he wasn't better antibiotic would be sent to [facility].

## 2017-08-11 NOTE — Telephone Encounter (Signed)
Patient aware that rx sent to pharmacy. 

## 2017-08-27 ENCOUNTER — Other Ambulatory Visit: Payer: Self-pay | Admitting: Family Medicine

## 2017-08-31 ENCOUNTER — Other Ambulatory Visit: Payer: Self-pay | Admitting: Family Medicine

## 2017-09-06 IMAGING — CT CT CHEST W/ CM
2 of 3 series · 15 of 36 positions shown, 18 images · IV contrast (Omnipaque 300)
Comparison: None.

CLINICAL DATA: Difficulty swallowing for 4 weeks.

EXAM:
CT CHEST WITH CONTRAST
TECHNIQUE: Multidetector CT imaging of the chest was performed during
intravenous contrast administration.
CONTRAST:  75mL OMNIPAQUE IOHEXOL 300 MG/ML  SOLN

[Series 2: chestroutine 5.0 b40f · axial · 0.72mm/px · z∈[-276,-16]mm · 12 of 62 slices shown, 15 images]
[im 5/62  mediastinal]
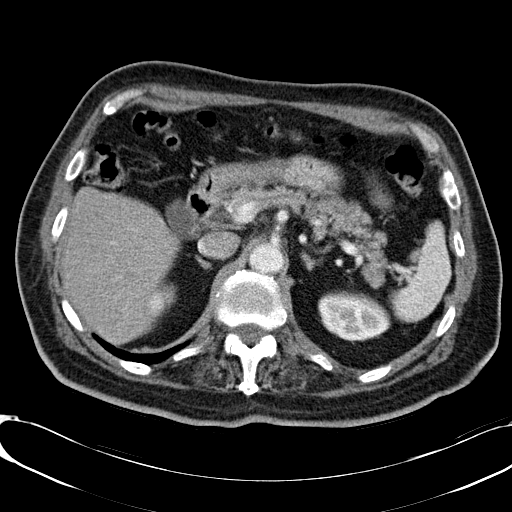
[im 5/62  lung]
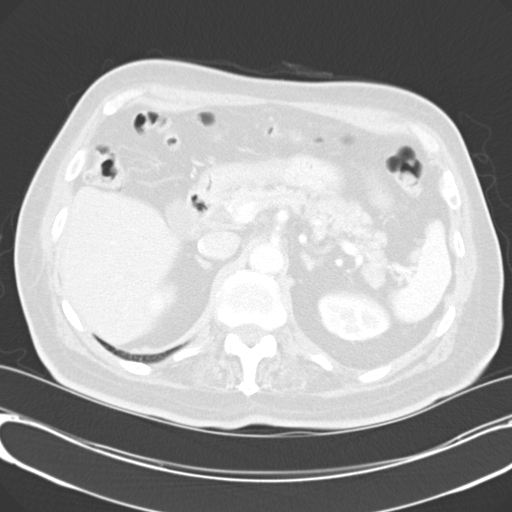
[im 10/62  lung]
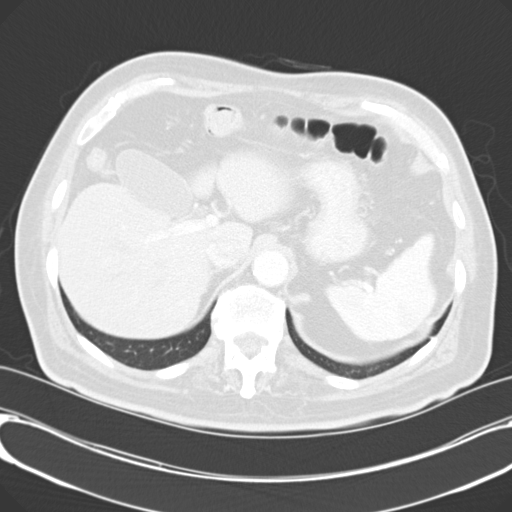
[im 14/62  lung]
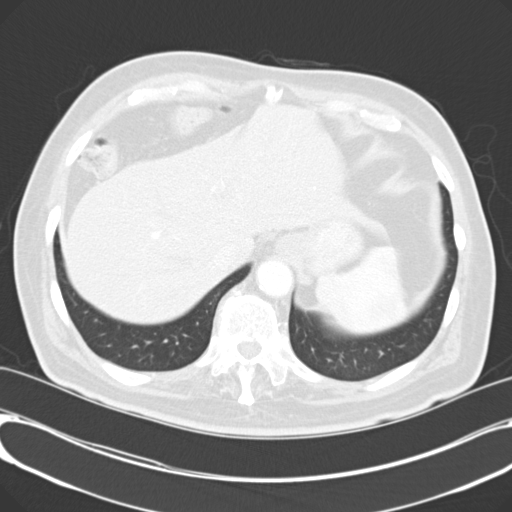
[im 19/62  lung]
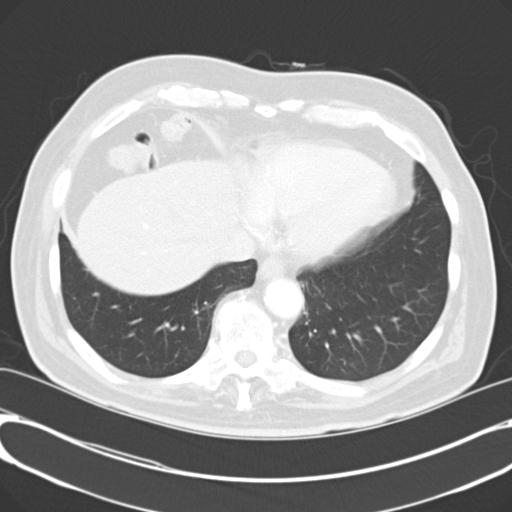
[im 23/62  mediastinal]
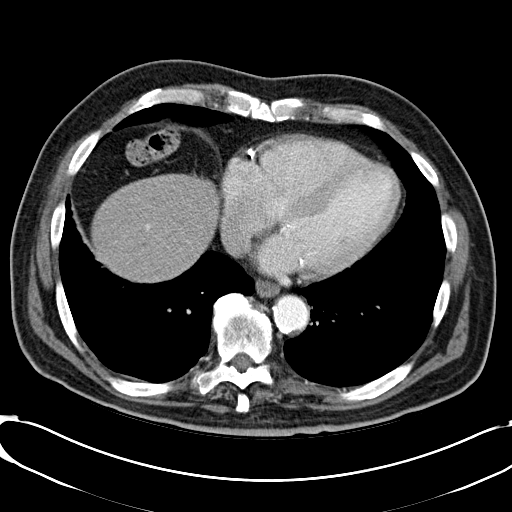
[im 23/62  lung]
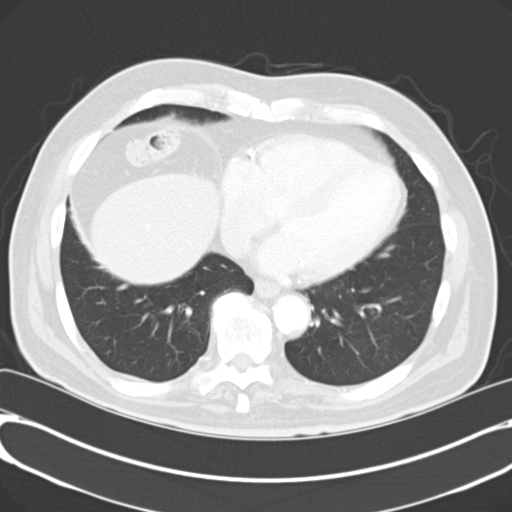
[im 28/62  lung]
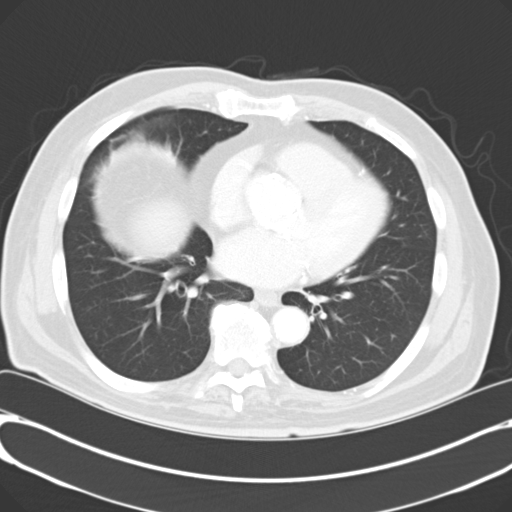
[im 34/62  lung]
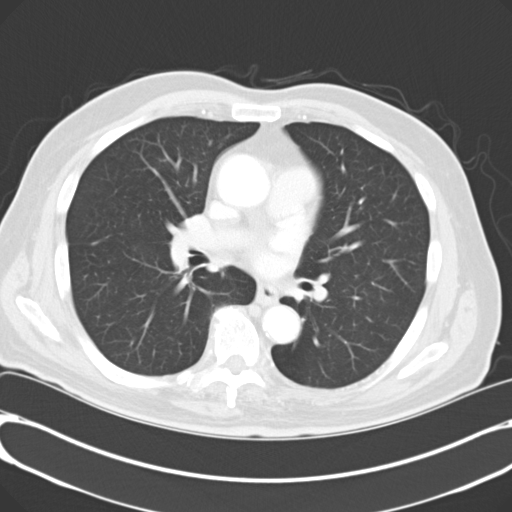
[im 39/62  lung]
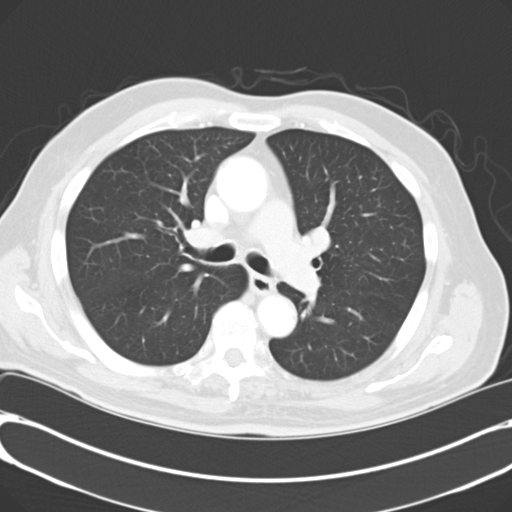
[im 43/62  mediastinal]
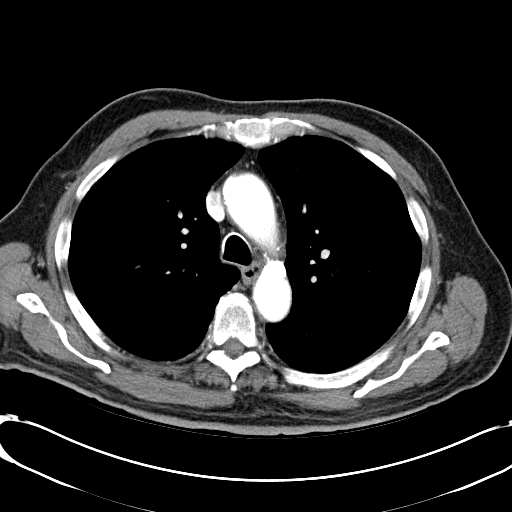
[im 43/62  lung]
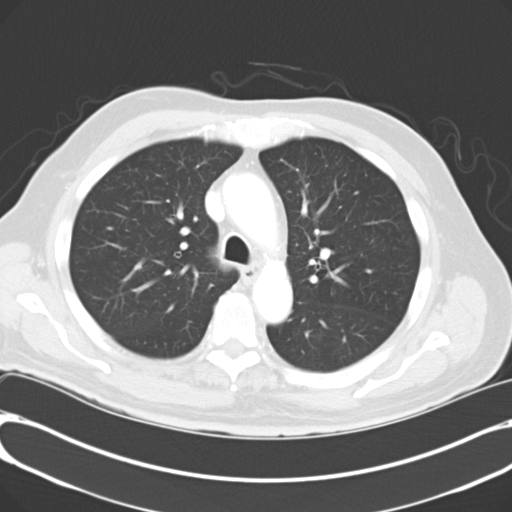
[im 48/62  lung]
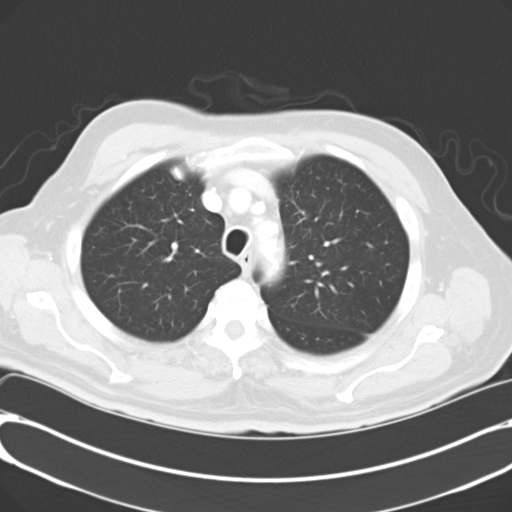
[im 52/62  lung]
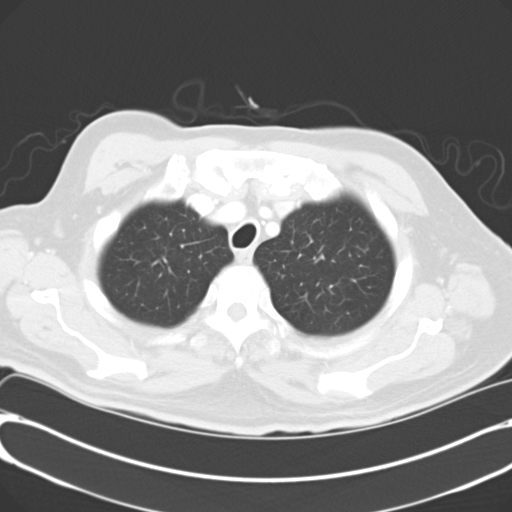
[im 57/62  lung]
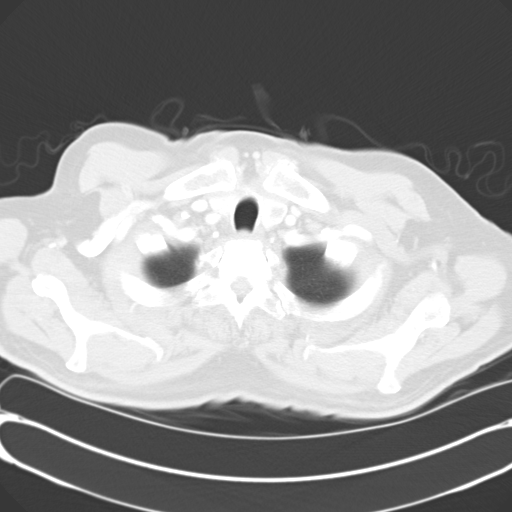

[Series 4: mpr coronal chest 3mm · coronal · 0.62mm/px · 3 of 91 slices shown]
[im 19/91  lung]
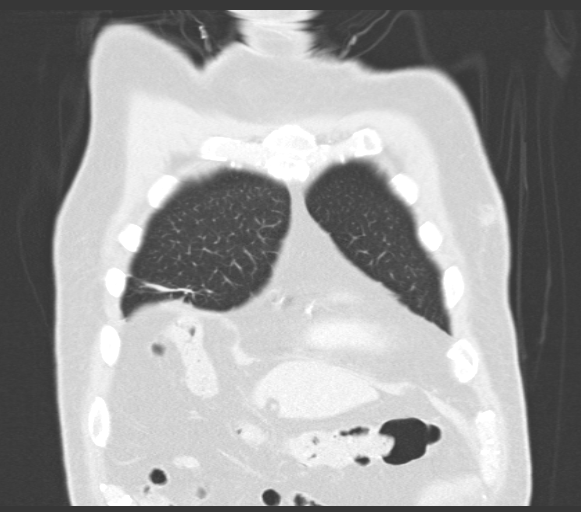
[im 37/91  lung]
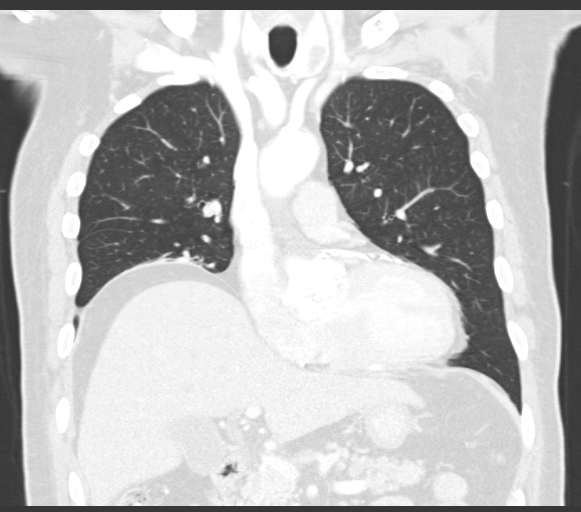
[im 55/91  lung]
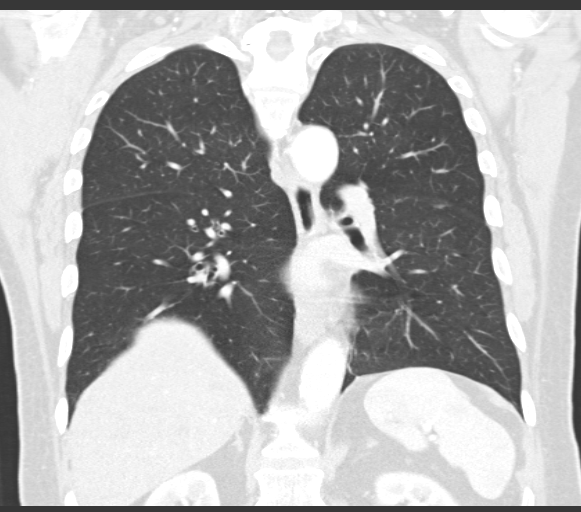

[15 of 36 positions shown; findings below may reference images not displayed]

FINDINGS: Mediastinum/Nodes: No chest wall mass, supraclavicular or axillary
lymphadenopathy. 10 mm left thyroid nodule.

The heart is normal in size. No pericardial effusion. Moderate
scattered atherosclerotic calcifications involving the thoracic
aorta but no focal aneurysm or dissection. The branch vessels are
patent. Scattered atherosclerotic calcifications. There are fairly
extensive 3 vessel coronary artery calcifications. The esophagus is
grossly normal. No mass, wall thickening or extrinsic compression to
account for the patient's difficulty swallowing. Upper endoscopy or
barium esophagram may be helpful for further evaluation.

No mediastinal or hilar mass or lymphadenopathy.

Lungs/Pleura: The lungs are clear. No infiltrates, edema, effusions,
interstitial lung disease or bronchiectasis. No worrisome pulmonary
lesions.

Upper abdomen: Diffuse fatty infiltration of the liver is noted. No
focal hepatic lesions. Moderate atherosclerotic calcifications
involving the abdominal aorta.

Musculoskeletal: No significant bony findings. Moderate degenerative
changes involving the lower thoracic spine with bridging
osteophytes.
IMPRESSION: 1. No acute pulmonary findings, pulmonary lesions or pleural
effusion.
2. Aortic and coronary artery calcifications.
3. No mediastinal or hilar mass or adenopathy.
4. Normal CT appearance of the esophagus. Correlation with barium
esophagram or endoscopy may be useful.

## 2017-11-27 ENCOUNTER — Other Ambulatory Visit: Payer: Self-pay | Admitting: *Deleted

## 2017-11-27 MED ORDER — PRAVASTATIN SODIUM 80 MG PO TABS: 80.0000 mg | ORAL_TABLET | Freq: Every evening | ORAL | 0 refills | Status: DC

## 2017-12-12 ENCOUNTER — Ambulatory Visit (INDEPENDENT_AMBULATORY_CARE_PROVIDER_SITE_OTHER): Payer: Medicare HMO

## 2017-12-12 DIAGNOSIS — Z23 Encounter for immunization: Secondary | ICD-10-CM | POA: Diagnosis not present

## 2018-01-12 ENCOUNTER — Other Ambulatory Visit: Payer: Self-pay | Admitting: Family Medicine

## 2018-01-12 DIAGNOSIS — G7000 Myasthenia gravis without (acute) exacerbation: Secondary | ICD-10-CM | POA: Diagnosis not present

## 2018-01-12 DIAGNOSIS — G603 Idiopathic progressive neuropathy: Secondary | ICD-10-CM | POA: Diagnosis not present

## 2018-01-12 DIAGNOSIS — D519 Vitamin B12 deficiency anemia, unspecified: Secondary | ICD-10-CM | POA: Diagnosis not present

## 2018-01-12 DIAGNOSIS — M5412 Radiculopathy, cervical region: Secondary | ICD-10-CM | POA: Diagnosis not present

## 2018-01-12 MED ORDER — PRAVASTATIN SODIUM 80 MG PO TABS: 80.0000 mg | ORAL_TABLET | Freq: Every evening | ORAL | 0 refills | Status: DC

## 2018-01-12 NOTE — Telephone Encounter (Signed)
Pt aware refill sent to pharmacy & appt made for 01/27/18

## 2018-01-27 ENCOUNTER — Encounter: Payer: Self-pay | Admitting: Family Medicine

## 2018-01-27 ENCOUNTER — Ambulatory Visit (INDEPENDENT_AMBULATORY_CARE_PROVIDER_SITE_OTHER): Payer: Medicare HMO | Admitting: Family Medicine

## 2018-01-27 VITALS — BP 171/64 | HR 64 | Temp 98.6°F | Ht 68.0 in | Wt 192.0 lb

## 2018-01-27 DIAGNOSIS — D72829 Elevated white blood cell count, unspecified: Secondary | ICD-10-CM

## 2018-01-27 DIAGNOSIS — E7800 Pure hypercholesterolemia, unspecified: Secondary | ICD-10-CM | POA: Diagnosis not present

## 2018-01-27 DIAGNOSIS — M21372 Foot drop, left foot: Secondary | ICD-10-CM | POA: Diagnosis not present

## 2018-01-27 DIAGNOSIS — I10 Essential (primary) hypertension: Secondary | ICD-10-CM

## 2018-01-27 DIAGNOSIS — R011 Cardiac murmur, unspecified: Secondary | ICD-10-CM | POA: Diagnosis not present

## 2018-01-27 DIAGNOSIS — R7303 Prediabetes: Secondary | ICD-10-CM | POA: Diagnosis not present

## 2018-01-27 DIAGNOSIS — G7000 Myasthenia gravis without (acute) exacerbation: Secondary | ICD-10-CM

## 2018-01-27 LAB — BAYER DCA HB A1C WAIVED: HB A1C (BAYER DCA - WAIVED): 5.6 % (ref <7.0)

## 2018-01-27 NOTE — Patient Instructions (Signed)
You had labs performed today.  You will be contacted with the results of the labs once they are available, usually in the next 3 business days for routine lab work.   

## 2018-01-27 NOTE — Progress Notes (Signed)
Subjective: CC: Chronic f/u PCP: [name], DO ZDG:LOVFI Duane Burns is a 82 y.o. male presenting to clinic today for:  1. HTN Patient not currently on any medications for hypertension.  He denies any chest pain, shortness of breath.  No lower extremity edema.  He states that he has been using Mucinex DM over the last couple of days for a cough.  No other OTC cough and cold medications.  He takes a daily aspirin.  He has a follow-up visit with his cardiologist on Friday.  2.  Hyperlipidemia Patient reports compliance with pravastatin daily.  Again, no chest pain, shortness of breath or lower extremity edema.  He tries to remain physically active and asks if he can chop wood.  3.  Myasthenia gravis/left foot drop Patient followed by Dr. Merlene Laughter with recent visit.  He states that he was told he was doing well.  He is taking prednisone and Mestinon daily as directed.  He does report atrophy in bilateral hands and feels that he has associated weakness.  He uses a cane for ambulation.  He also has a history of left foot drop for which he uses a brace.  His braces several years old.  He does have an area that it rubs on the left anterior shin.   ROS: Per HPI  No Known Allergies Past Medical History:  Diagnosis Date  . Arthritis   . Cancer [facility])    History of skin cancer / hx Prostate cancer  . Dysphagia 11/10/2014  . Dysphonia 11/11/2014  . Foot drop, left    DUE TO NERVE INJURY IN BACK PER PT  . HNP (herniated nucleus pulposus with myelopathy), thoracic    HISTORY OF HNP  . Hyperlipidemia   . Hypertension   . Myasthenia gravis ([place]) 11/12/2014   versus Guillain-Barr syndrome.  . Pneumonia    JAN 2016  . Urgency of urination   . Urinary leakage     Current Outpatient Medications:  .  aspirin EC 81 MG tablet, Take 81 mg by mouth daily., Disp: , Rfl:  .  cetirizine (ZYRTEC) 10 MG tablet, Take 10 mg by mouth at bedtime., Disp: , Rfl:  .  cholecalciferol (VITAMIN D) 1000  UNITS tablet, Take 1,000 Units by mouth daily., Disp: , Rfl:  .  Multiple Vitamin (MULTIVITAMIN WITH MINERALS) TABS tablet, Take 1 tablet by mouth daily., Disp: , Rfl:  .  Omega-3 Fatty Acids (FISH OIL) 1200 MG CAPS, Take 1-2 capsules (1,200-2,400 mg total) by mouth 2 (two) times daily. RESTART IN 1 WEEK IF YOUR SWALLOWING CONTINUES TO IMPROVE. Take 2 capsules in the morning and 1 capsule in the evening, Disp: , Rfl:  .  pravastatin (PRAVACHOL) 80 MG tablet, Take 1 tablet (80 mg total) by mouth every evening., Disp: 30 tablet, Rfl: 0 .  predniSONE (DELTASONE) 10 MG tablet, Take 10 mg by mouth daily., Disp: , Rfl: 5 .  pyridostigmine (MESTINON) 60 MG tablet, Take 60 mg by mouth 3 (three) times daily. , Disp: , Rfl:  Social History   Socioeconomic History  . Marital status: Married    Spouse name: Not on file  . Number of children: Not on file  . Years of education: Not on file  . Highest education level: Not on file  Occupational History  . Not on file  Social Needs  . Financial resource strain: Not on file  . Food insecurity:    Worry: Not on file    Inability: Not on  file  . Transportation needs:    Medical: Not on file    Non-medical: Not on file  Tobacco Use  . Smoking status: Former Smoker    Last attempt to quit: 05/05/1994    Years since quitting: 23.7  . Smokeless tobacco: Never Used  Substance and Sexual Activity  . Alcohol use: No  . Drug use: No  . Sexual activity: Not Currently  Lifestyle  . Physical activity:    Days per week: Not on file    Minutes per session: Not on file  . Stress: Not on file  Relationships  . Social connections:    Talks on phone: Not on file    Gets together: Not on file    Attends religious service: Not on file    Active member of club or organization: Not on file    Attends meetings of clubs or organizations: Not on file    Relationship status: Not on file  . Intimate partner violence:    Fear of current or ex partner: Not on file     Emotionally abused: Not on file    Physically abused: Not on file    Forced sexual activity: Not on file  Other Topics Concern  . Not on file  Social History Narrative  . Not on file   Family History  Problem Relation Age of Onset  . Diabetes Other   . Hypertension Other     Objective: Office vital signs reviewed. BP (!) 171/64   Pulse 64   Temp 98.6 F (37 C) (Oral)   Ht 5' 8"  (1.727 m)   Wt 192 lb (87.1 kg)   BMI 29.19 kg/m   Physical Examination:  General: Awake, alert, well nourished, No acute distress HEENT: Normal, MMM Cardio: regular rate and rhythm, S1S2 heard, 2/6 SEM appreciated at RSB Pulm: clear to auscultation bilaterally, no wheezes, rhonchi or rales; normal work of breathing on room air Extremities: warm, well perfused, No edema, cyanosis or clubbing; +2 pulses bilaterally MSK: uses cane for ambulation. He has atrophy of thenar and interdigital muscles of bilateral hands.  Left LE w/ brace for foot drop.  Assessment/ Plan: 82 y.o. male   1. Essential hypertension Blood pressure not at goal.  Manual was still elevated.  May be related to use of over-the-counter cough and cold medication.  I recommended that he follow-up in 2 weeks for recheck with nurse.  If persistently elevated, may need to consider initiating antihypertensives.  Currently asymptomatic.  Follow-up with cardiology as directed as well. - Lipid Panel - CMP14+EGFR  2. Myasthenia gravis ([place]) Under the care of Dr. Merlene Laughter, neurology.  Recent reports were good.  3. Pure hypercholesterolemia Fasting. Check lipid.  Continue Pravastatin - Lipid Panel - CMP14+EGFR  4. Prediabetes On chronic steroids. - Bayer DCA Hb A1c Waived  5. Leukocytosis, unspecified type Likely from chronic steroids.  Previous smear normal. - CBC  6. Foot drop, left Rx given for new brace - Brace  7. Heart murmur, systolic Known.  Followed by Cardiology    Orders Placed This Encounter  Procedures  .  Brace    AFO for Left foot drop Dx:  Marland Kitchen Lipid Panel  . CMP14+EGFR  . Bayer DCA Hb A1c Waived  . CBC   No orders of the defined types were placed in this encounter.    [name], DO [place] [phone]

## 2018-01-28 LAB — CMP14+EGFR
A/G RATIO: 2.1 (ref 1.2–2.2)
ALK PHOS: 67 IU/L (ref 39–117)
ALT: 21 IU/L (ref 0–44)
AST: 18 IU/L (ref 0–40)
Albumin: 4.2 g/dL (ref 3.5–4.7)
BUN / CREAT RATIO: 17 (ref 10–24)
BUN: 18 mg/dL (ref 8–27)
Bilirubin Total: 0.7 mg/dL (ref 0.0–1.2)
CO2: 25 mmol/L (ref 20–29)
Calcium: 9.3 mg/dL (ref 8.6–10.2)
Chloride: 103 mmol/L (ref 96–106)
Creatinine, Ser: 1.07 mg/dL (ref 0.76–1.27)
GFR calc non Af Amer: 65 mL/min/{1.73_m2} (ref >59)
GFR, EST AFRICAN AMERICAN: 75 mL/min/{1.73_m2} (ref >59)
GLUCOSE: 86 mg/dL (ref 65–99)
Globulin, Total: 2 g/dL (ref 1.5–4.5)
Potassium: 4.4 mmol/L (ref 3.5–5.2)
Sodium: 142 mmol/L (ref 134–144)
TOTAL PROTEIN: 6.2 g/dL (ref 6.0–8.5)

## 2018-01-28 LAB — CBC
HEMATOCRIT: 42.1 % (ref 37.5–51.0)
HEMOGLOBIN: 14.4 g/dL (ref 13.0–17.7)
MCH: 29.9 pg (ref 26.6–33.0)
MCHC: 34.2 g/dL (ref 31.5–35.7)
MCV: 88 fL (ref 79–97)
Platelets: 219 10*3/uL (ref 150–450)
RBC: 4.81 x10E6/uL (ref 4.14–5.80)
RDW: 12.5 % (ref 12.3–15.4)
WBC: 12.8 10*3/uL — ABNORMAL HIGH (ref 3.4–10.8)

## 2018-01-28 LAB — LIPID PANEL
Chol/HDL Ratio: 2.8 ratio (ref 0.0–5.0)
Cholesterol, Total: 171 mg/dL (ref 100–199)
HDL: 61 mg/dL (ref >39)
LDL CALC: 96 mg/dL (ref 0–99)
Triglycerides: 69 mg/dL (ref 0–149)
VLDL Cholesterol Cal: 14 mg/dL (ref 5–40)

## 2018-01-30 ENCOUNTER — Encounter: Payer: Self-pay | Admitting: Cardiovascular Disease

## 2018-01-30 ENCOUNTER — Ambulatory Visit: Payer: Medicare HMO | Admitting: Cardiovascular Disease

## 2018-01-30 VITALS — BP 160/70 | HR 72 | Ht 68.0 in | Wt 192.0 lb

## 2018-01-30 DIAGNOSIS — I712 Thoracic aortic aneurysm, without rupture, unspecified: Secondary | ICD-10-CM

## 2018-01-30 DIAGNOSIS — I350 Nonrheumatic aortic (valve) stenosis: Secondary | ICD-10-CM | POA: Diagnosis not present

## 2018-01-30 DIAGNOSIS — I4430 Unspecified atrioventricular block: Secondary | ICD-10-CM | POA: Diagnosis not present

## 2018-01-30 DIAGNOSIS — I77810 Thoracic aortic ectasia: Secondary | ICD-10-CM

## 2018-01-30 NOTE — Patient Instructions (Signed)
Medication Instructions:  Your physician recommends that you continue on your current medications as directed. Please refer to the Current Medication list given to you today.  If you need a refill on your cardiac medications before your next appointment, please call your pharmacy.   Lab work: none If you have labs (blood work) drawn today and your tests are completely normal, you will receive your results only by: Marland Kitchen MyChart Message (if you have MyChart) OR . A paper copy in the mail If you have any lab test that is abnormal or we need to change your treatment, we will call you to review the results.  Testing/Procedures: Your physician has requested that you have an echocardiogram. Echocardiography is a painless test that uses sound waves to create images of your heart. It provides your doctor with information about the size and shape of your heart and how well your heart's chambers and valves are working. This procedure takes approximately one hour. There are no restrictions for this procedure.  Your physician has requested that you have cardiac CT. Cardiac computed tomography (CT) is a painless test that uses an x-ray machine to take clear, detailed pictures of your heart. For further information please visit [email or web address]. Please follow instruction sheet as given. GET LAB WORK ! WEEK BEFORE TEST: BMET     Follow-Up: At [facility], you and your health needs are our priority.  As part of our continuing mission to provide you with exceptional heart care, we have created designated Provider Care Teams.  These Care Teams include your primary Cardiologist (physician) and Advanced Practice Providers (APPs -  Physician Assistants and Nurse Practitioners) who all work together to provide you with the care you need, when you need it. You will need a follow up appointment in 6 months.  Please call our office 2 months in advance to schedule this appointment.  You may see Kate Sable, MD  or one of the following Advanced Practice Providers on your designated Care Team:   Bernerd Pho, PA-C [facility]) . Ermalinda Barrios, PA-C ([place])  Any Other Special Instructions Will Be Listed Below (If Applicable). None

## 2018-01-30 NOTE — Progress Notes (Signed)
SUBJECTIVE: The patient presents for routine follow-up.  I also reviewed office notes from his PCP visit on 01/27/2018.  He was noted to be hypertensive at that time, 171/64.  He is hypertensive in our office today.  He is due to have it rechecked with a nurse at his PCPs office.  He has moderate to severe aortic stenosis, myasthenic gravis, and hyperlipidemia.  In summary, cardiac monitoring in April 2019 demonstrated sinus rhythm with PACs and occasionally blocked PACs.  Echocardiogram on 05/06/2017 demonstrated normal left ventricular systolic function, LVEF 60 to 65%, mild LVH, grade 1 diastolic dysfunction, and moderate to severe aortic stenosis, mean gradient 26 mmHg.  Chest CT on 05/22/2017 showed that the aortic root was mildly dilated at 4.2 cm.  Coronary artery calcifications were also noted.  The patient denies any symptoms of chest pain, palpitations, shortness of breath, lightheadedness, dizziness, leg swelling, orthopnea, PND, and syncope.  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   Social Hx: He and his wife have foster 19 kids. They have 5 grandchildren and 5 great-grandchildren.  His wife met him when she was 50, got engaged at 87, and got married at the age of 41.  They have been married 79 years in July 2019.  Family history: Theirdaughter underwent aortic valve replacement at the age of 60.  Review of Systems: As per "subjective", otherwise negative.  No Known Allergies  Current Outpatient Medications  Medication Sig Dispense Refill  . aspirin EC 81 MG tablet Take 81 mg by mouth daily.    . cholecalciferol (VITAMIN D) 1000 UNITS tablet Take 1,000 Units by mouth daily.    . Multiple Vitamin (MULTIVITAMIN WITH MINERALS) TABS tablet Take 1 tablet by mouth daily.    . Omega-3 Fatty Acids (FISH OIL) 1200 MG CAPS Take 1-2 capsules (1,200-2,400 mg total) by mouth 2 (two) times daily. RESTART IN 1 WEEK IF YOUR SWALLOWING CONTINUES TO IMPROVE.  Take 2 capsules in the morning and 1 capsule in the evening    . pravastatin (PRAVACHOL) 80 MG tablet Take 1 tablet (80 mg total) by mouth every evening. 30 tablet 0  . predniSONE (DELTASONE) 10 MG tablet Take 10 mg by mouth daily.  5  . pyridostigmine (MESTINON) 60 MG tablet Take 60 mg by mouth 3 (three) times daily.      No current facility-administered medications for this visit.     Past Medical History:  Diagnosis Date  . Arthritis   . Cancer [facility])    History of skin cancer / hx Prostate cancer  . Dysphagia 11/10/2014  . Dysphonia 11/11/2014  . Foot drop, left    DUE TO NERVE INJURY IN BACK PER PT  . HNP (herniated nucleus pulposus with myelopathy), thoracic    HISTORY OF HNP  . Hyperlipidemia   . Hypertension   . Myasthenia gravis ([place]) 11/12/2014   versus Guillain-Barr syndrome.  . Pneumonia    JAN 2016  . Urgency of urination   . Urinary leakage     Past Surgical History:  Procedure Laterality Date  . BACK SURGERY     X 3  . CYSTOSCOPY WITH URETHRAL DILATATION N/A 05/10/2014   Procedure: CYSTOSCOPY WITH URETHRAL DILATATION;  Surgeon: Alexis Frock, MD;  Location: [facility];  Service: Urology;  Laterality: N/A;  with catheter placement, prior to surgery. procedure performed on the stretcher.  Marland Kitchen PROSTATE SURGERY    . SKIN CANCER EXCISION    . TONSILLECTOMY    .  TOTAL HIP ARTHROPLASTY Right 05/10/2014   Procedure: RIGHT TOTAL HIP ARTHROPLASTY ANTERIOR APPROACH ;  Surgeon: Paralee Cancel, MD;  Location: [facility];  Service: Orthopedics;  Laterality: Right;    Social History   Socioeconomic History  . Marital status: Married    Spouse name: Not on file  . Number of children: Not on file  . Years of education: Not on file  . Highest education level: Not on file  Occupational History  . Not on file  Social Needs  . Financial resource strain: Not on file  . Food insecurity:    Worry: Not on file    Inability: Not on file  . Transportation needs:    Medical: Not on file      Non-medical: Not on file  Tobacco Use  . Smoking status: Former Smoker    Last attempt to quit: 05/05/1994    Years since quitting: 23.7  . Smokeless tobacco: Never Used  Substance and Sexual Activity  . Alcohol use: No  . Drug use: No  . Sexual activity: Not Currently  Lifestyle  . Physical activity:    Days per week: Not on file    Minutes per session: Not on file  . Stress: Not on file  Relationships  . Social connections:    Talks on phone: Not on file    Gets together: Not on file    Attends religious service: Not on file    Active member of club or organization: Not on file    Attends meetings of clubs or organizations: Not on file    Relationship status: Not on file  . Intimate partner violence:    Fear of current or ex partner: Not on file    Emotionally abused: Not on file    Physically abused: Not on file    Forced sexual activity: Not on file  Other Topics Concern  . Not on file  Social History Narrative  . Not on file     Vitals:   01/30/18 1112  BP: (!) 160/70  Pulse: 72  SpO2: 97%  Weight: 192 lb (87.1 kg)  Height: 5' 8"  (1.727 m)    Wt Readings from Last 3 Encounters:  01/30/18 192 lb (87.1 kg)  01/27/18 192 lb (87.1 kg)  08/04/17 186 lb (84.4 kg)     PHYSICAL EXAM General: NAD HEENT: Normal. Neck: No JVD, no thyromegaly. Lungs: Clear to auscultation bilaterally with normal respiratory effort. CV: Regular rate and rhythm, normal S1/S2, no S3/S4, 3/6 ejection systolicmurmurloudest over right upper sternal border. No pretibial or periankle edema.  No carotid bruit.   Abdomen: Soft, nontender, no distention.  Neurologic: Alert and oriented.  Psych: Normal affect. Skin: Normal. Musculoskeletal: No gross deformities.    ECG: Reviewed above under Subjective   Labs: Lab Results  Component Value Date/Time   K 4.4 01/27/2018 09:11 AM   BUN 18 01/27/2018 09:11 AM   CREATININE 1.07 01/27/2018 09:11 AM   ALT 21 01/27/2018 09:11 AM    TSH 1.690 11/27/2016 11:50 AM   HGB 14.4 01/27/2018 09:11 AM     Lipids: Lab Results  Component Value Date/Time   LDLCALC 96 01/27/2018 09:11 AM   CHOL 171 01/27/2018 09:11 AM   TRIG 69 01/27/2018 09:11 AM   HDL 61 01/27/2018 09:11 AM       ASSESSMENT AND PLAN:  1.  Intermittent 2-1 AV block: Event monitoring results detailed above with sinus rhythm and PACs and occasionally blocked PACs.  He is  asymptomatic. This is likely due to myasthenia gravis vs pyridostigmine itself. I would defer to neurology regarding alternative agents over pyridostigmineif deemed feasible.  2.  Moderate to severe aortic stenosis: Symptomatically stable.  I will repeat an echocardiogram in 6 months.  I informed him to notify me should he develop symptoms such as chest pain, shortness of breath, or near syncope/syncope and to come and see me sooner.  3.  Mild aortic root dilatation: 4.2 cm in diameter.  I will repeat imaging in 6 months.  4.  Hypertension: He is due to have his blood pressure checked with a nurse visit at his PCPs office on 02/10/2018.   Disposition: Follow up 6 months   Kate Sable, M.D., F.A.C.C.

## 2018-02-04 ENCOUNTER — Other Ambulatory Visit: Payer: Self-pay | Admitting: Family Medicine

## 2018-02-10 ENCOUNTER — Ambulatory Visit: Payer: Medicare HMO | Admitting: *Deleted

## 2018-02-10 DIAGNOSIS — Z0130 Encounter for examination of blood pressure without abnormal findings: Secondary | ICD-10-CM

## 2018-02-10 NOTE — Progress Notes (Signed)
Pt here for BP check BP 155 63 P 74   Rck BP BP  135 62 P 70

## 2018-03-03 ENCOUNTER — Encounter: Payer: Self-pay | Admitting: Family Medicine

## 2018-03-03 ENCOUNTER — Ambulatory Visit (INDEPENDENT_AMBULATORY_CARE_PROVIDER_SITE_OTHER): Payer: Medicare HMO | Admitting: Family Medicine

## 2018-03-03 VITALS — BP 150/82 | HR 66 | Temp 97.3°F | Ht 67.0 in | Wt 193.0 lb

## 2018-03-03 DIAGNOSIS — I10 Essential (primary) hypertension: Secondary | ICD-10-CM | POA: Diagnosis not present

## 2018-03-03 MED ORDER — LISINOPRIL 5 MG PO TABS: 5.0000 mg | ORAL_TABLET | Freq: Every day | ORAL | 2 refills | Status: DC

## 2018-03-03 NOTE — Patient Instructions (Signed)
Schedule a visit with the nurse to have Blood pressure checked in 1 week.  You will get labs done that day as well.  If blood pressure is at goal during that visit, we can see each other in 3 months.  Start the Lisinopril today. Keep an eye on blood pressure.

## 2018-03-03 NOTE — Progress Notes (Signed)
Subjective: CC: Follow up HTN PCP: [name], DO GYI:RSWNI Duane Burns is a 83 y.o. male presenting to clinic today for:  1. HTN Patient here for one-month follow-up on his blood pressure.  He was seen a couple of weeks ago by RN and initially had an elevated blood pressure.  Upon recheck it was within normal limits for age.  He has had elevated blood pressure during multiple office visits including at the cardiologist office.  Today he has elevated blood pressure as well.  He reports fair amounts of physical activity at home.  He denies excessive salt intake.  Blood pressure was diet controlled previously.  He denies any chest pain, headaches, dizziness, visual disturbance, lower extremity edema or shortness of breath.  He has a medical history significant for hyperlipidemia, myasthenia gravis, moderate to severe aortic stenosis, and mild dilation of his aortic root with associated coronary artery calcifications.  ROS: Per HPI  No Known Allergies Past Medical History:  Diagnosis Date  . Arthritis   . Cancer [facility])    History of skin cancer / hx Prostate cancer  . Dysphagia 11/10/2014  . Dysphonia 11/11/2014  . Foot drop, left    DUE TO NERVE INJURY IN BACK PER PT  . HNP (herniated nucleus pulposus with myelopathy), thoracic    HISTORY OF HNP  . Hyperlipidemia   . Hypertension   . Myasthenia gravis ([place]) 11/12/2014   versus Guillain-Barr syndrome.  . Pneumonia    JAN 2016  . Urgency of urination   . Urinary leakage     Current Outpatient Medications:  .  aspirin EC 81 MG tablet, Take 81 mg by mouth daily., Disp: , Rfl:  .  cholecalciferol (VITAMIN D) 1000 UNITS tablet, Take 1,000 Units by mouth daily., Disp: , Rfl:  .  Multiple Vitamin (MULTIVITAMIN WITH MINERALS) TABS tablet, Take 1 tablet by mouth daily., Disp: , Rfl:  .  Omega-3 Fatty Acids (FISH OIL) 1200 MG CAPS, Take 1-2 capsules (1,200-2,400 mg total) by mouth 2 (two) times daily. RESTART IN 1 WEEK IF YOUR  SWALLOWING CONTINUES TO IMPROVE. Take 2 capsules in the morning and 1 capsule in the evening, Disp: , Rfl:  .  pravastatin (PRAVACHOL) 80 MG tablet, TAKE 1 TABLET BY MOUTH EVERY EVENING, Disp: 90 tablet, Rfl: 1 .  predniSONE (DELTASONE) 10 MG tablet, Take 10 mg by mouth daily., Disp: , Rfl: 5 .  pyridostigmine (MESTINON) 60 MG tablet, Take 60 mg by mouth 3 (three) times daily. , Disp: , Rfl:  Social History   Socioeconomic History  . Marital status: Married    Spouse name: Not on file  . Number of children: Not on file  . Years of education: Not on file  . Highest education level: Not on file  Occupational History  . Not on file  Social Needs  . Financial resource strain: Not on file  . Food insecurity:    Worry: Not on file    Inability: Not on file  . Transportation needs:    Medical: Not on file    Non-medical: Not on file  Tobacco Use  . Smoking status: Former Smoker    Last attempt to quit: 05/05/1994    Years since quitting: 23.8  . Smokeless tobacco: Never Used  Substance and Sexual Activity  . Alcohol use: No  . Drug use: No  . Sexual activity: Not Currently  Lifestyle  . Physical activity:    Days per week: Not on file  Minutes per session: Not on file  . Stress: Not on file  Relationships  . Social connections:    Talks on phone: Not on file    Gets together: Not on file    Attends religious service: Not on file    Active member of club or organization: Not on file    Attends meetings of clubs or organizations: Not on file    Relationship status: Not on file  . Intimate partner violence:    Fear of current or ex partner: Not on file    Emotionally abused: Not on file    Physically abused: Not on file    Forced sexual activity: Not on file  Other Topics Concern  . Not on file  Social History Narrative  . Not on file   Family History  Problem Relation Age of Onset  . Diabetes Other   . Hypertension Other     Objective: Office vital signs  reviewed. BP (!) 150/82   Pulse 66   Temp (!) 97.3 F (36.3 C) (Oral)   Ht 5\' 7"  (1.702 m)   Wt 193 lb (87.5 kg)   BMI 30.23 kg/m   Physical Examination:  General: Awake, alert, well nourished, No acute distress HEENT: Normal, MMM, sclera white Cardio: regular rate and rhythm, S1S2 heard, 3/6 SEM appreciated at RSB Pulm: clear to auscultation bilaterally, no wheezes, rhonchi or rales; normal work of breathing on room air MSK: Using cane for ambulation.   Assessment/ Plan: 83 y.o. male   1. Essential hypertension Continues to be uncontrolled.  We discussed starting ACE inhibitor versus calcium channel blocker.  Since there is theoretical risk reduction given his aortic dilation we will start ACE inhibitor.  Lisinopril 5 mg daily started.  He is to follow-up in 1 week for nurse check of BP and BMP recheck.  He is to continue monitoring blood pressures at home as well.  He will follow-up in 1 to 3 months pending what his blood pressure check is with the nurse next week.   Orders Placed This Encounter  Procedures  . Basic Metabolic Panel    Standing Status:   Future    Standing Expiration Date:   03/04/2019   Meds ordered this encounter  Medications  . lisinopril (PRINIVIL,ZESTRIL) 5 MG tablet    Sig: Take 1 tablet (5 mg total) by mouth daily.    Dispense:  30 tablet    Refill:  [place], [place] [phone]

## 2018-03-06 DIAGNOSIS — R32 Unspecified urinary incontinence: Secondary | ICD-10-CM | POA: Diagnosis not present

## 2018-03-06 DIAGNOSIS — Z7952 Long term (current) use of systemic steroids: Secondary | ICD-10-CM | POA: Diagnosis not present

## 2018-03-06 DIAGNOSIS — E785 Hyperlipidemia, unspecified: Secondary | ICD-10-CM | POA: Diagnosis not present

## 2018-03-06 DIAGNOSIS — I10 Essential (primary) hypertension: Secondary | ICD-10-CM | POA: Diagnosis not present

## 2018-03-06 DIAGNOSIS — G7000 Myasthenia gravis without (acute) exacerbation: Secondary | ICD-10-CM | POA: Diagnosis not present

## 2018-03-06 DIAGNOSIS — Z6830 Body mass index (BMI) 30.0-30.9, adult: Secondary | ICD-10-CM | POA: Diagnosis not present

## 2018-03-06 DIAGNOSIS — N529 Male erectile dysfunction, unspecified: Secondary | ICD-10-CM | POA: Diagnosis not present

## 2018-03-06 DIAGNOSIS — R269 Unspecified abnormalities of gait and mobility: Secondary | ICD-10-CM | POA: Diagnosis not present

## 2018-03-06 DIAGNOSIS — Z7982 Long term (current) use of aspirin: Secondary | ICD-10-CM | POA: Diagnosis not present

## 2018-03-06 DIAGNOSIS — E669 Obesity, unspecified: Secondary | ICD-10-CM | POA: Diagnosis not present

## 2018-03-10 ENCOUNTER — Ambulatory Visit: Payer: Medicare HMO | Admitting: *Deleted

## 2018-03-10 VITALS — BP 140/61 | HR 59

## 2018-03-10 DIAGNOSIS — Z0130 Encounter for examination of blood pressure without abnormal findings: Secondary | ICD-10-CM

## 2018-03-10 NOTE — Progress Notes (Signed)
Pt here for BP check BP 140 61 P 59

## 2018-04-08 DIAGNOSIS — H02053 Trichiasis without entropian right eye, unspecified eyelid: Secondary | ICD-10-CM | POA: Diagnosis not present

## 2018-04-08 DIAGNOSIS — H2512 Age-related nuclear cataract, left eye: Secondary | ICD-10-CM | POA: Diagnosis not present

## 2018-04-08 DIAGNOSIS — H25812 Combined forms of age-related cataract, left eye: Secondary | ICD-10-CM | POA: Diagnosis not present

## 2018-04-08 DIAGNOSIS — Z01818 Encounter for other preprocedural examination: Secondary | ICD-10-CM | POA: Diagnosis not present

## 2018-04-22 DIAGNOSIS — H2511 Age-related nuclear cataract, right eye: Secondary | ICD-10-CM | POA: Diagnosis not present

## 2018-04-22 DIAGNOSIS — H25811 Combined forms of age-related cataract, right eye: Secondary | ICD-10-CM | POA: Diagnosis not present

## 2018-05-28 ENCOUNTER — Other Ambulatory Visit: Payer: Self-pay | Admitting: Family Medicine

## 2018-06-08 ENCOUNTER — Other Ambulatory Visit: Payer: Self-pay

## 2018-06-08 ENCOUNTER — Ambulatory Visit (INDEPENDENT_AMBULATORY_CARE_PROVIDER_SITE_OTHER): Payer: Medicare HMO | Admitting: Family Medicine

## 2018-06-08 ENCOUNTER — Encounter: Payer: Self-pay | Admitting: Family Medicine

## 2018-06-08 DIAGNOSIS — E7800 Pure hypercholesterolemia, unspecified: Secondary | ICD-10-CM

## 2018-06-08 DIAGNOSIS — I10 Essential (primary) hypertension: Secondary | ICD-10-CM

## 2018-06-08 DIAGNOSIS — G7000 Myasthenia gravis without (acute) exacerbation: Secondary | ICD-10-CM

## 2018-06-08 NOTE — Progress Notes (Signed)
Telephone visit  Subjective: OJ:[id number] follow up PCP: [name], DO Duane Burns is a 83 y.o. male calls for telephone consult today. Patient provides verbal consent for consult held via phone.  Location of patient: home Location of provider: [facility] Others present for call: none  1. Hypertension Patient was last seen in January for hypertension follow-up.  He has had multiple elevated blood pressures throughout the last year and was started on lisinopril 5 mg daily.  He followed up on January 14 and blood pressure was within normal limits.  He did not have his BMP collected as directed.  He reports to me that he has not been checking BPs but is compliant with Lisinopril.  No chest pain, shortness of breath, edema or lightheadedness.  2.  Hyperlipidemia Patient reports compliance with pravastatin.  He does not need refills.  Again, no chest pain, shortness of breath.  3.  Myasthenia gravis Patient reports compliance with both the prednisone and Mestinon.  He occasionally continues to have weakness in the hands but this seems to work itself out if he moves his hands around a bit.  No difficulty swallowing.  He has follow-up with his specialist but this is been delayed due to COVID-19 outbreak.   ROS: Per HPI  No Known Allergies Past Medical History:  Diagnosis Date  . Arthritis   . Cancer [facility])    History of skin cancer / hx Prostate cancer  . Dysphagia 11/10/2014  . Dysphonia 11/11/2014  . Foot drop, left    DUE TO NERVE INJURY IN BACK PER PT  . HNP (herniated nucleus pulposus with myelopathy), thoracic    HISTORY OF HNP  . Hyperlipidemia   . Hypertension   . Myasthenia gravis ([place]) 11/12/2014   versus Guillain-Barr syndrome.  . Pneumonia    JAN 2016  . Urgency of urination   . Urinary leakage     Current Outpatient Medications:  .  aspirin EC 81 MG tablet, Take 81 mg by mouth daily., Disp: , Rfl:  .  cholecalciferol (VITAMIN D) 1000 UNITS tablet, Take  1,000 Units by mouth daily., Disp: , Rfl:  .  lisinopril (PRINIVIL,ZESTRIL) 5 MG tablet, TAKE 1 TABLET BY MOUTH EVERY DAY, Disp: 90 tablet, Rfl: 0 .  Multiple Vitamin (MULTIVITAMIN WITH MINERALS) TABS tablet, Take 1 tablet by mouth daily., Disp: , Rfl:  .  Omega-3 Fatty Acids (FISH OIL) 1200 MG CAPS, Take 1-2 capsules (1,200-2,400 mg total) by mouth 2 (two) times daily. RESTART IN 1 WEEK IF YOUR SWALLOWING CONTINUES TO IMPROVE. Take 2 capsules in the morning and 1 capsule in the evening, Disp: , Rfl:  .  pravastatin (PRAVACHOL) 80 MG tablet, TAKE 1 TABLET BY MOUTH EVERY EVENING, Disp: 90 tablet, Rfl: 1 .  predniSONE (DELTASONE) 10 MG tablet, Take 10 mg by mouth daily., Disp: , Rfl: 5 .  pyridostigmine (MESTINON) 60 MG tablet, Take 60 mg by mouth 3 (three) times daily. , Disp: , Rfl:   Assessment/ Plan: 83 y.o. male   1. Essential hypertension Was controlled upon his last check here in the office.  He is not monitoring blood pressures at home.  He is compliant with lisinopril does not require refills at this time.  He understands that we should check his kidney function and will do this as soon as he is able to come out.   2. Pure hypercholesterolemia Stable.  No refills needed on statin.  3. Myasthenia gravis ([place]) Stable.  Continues to follow-up with specialist.  Start time: 09:09am End time: 09:13am  Total time spent on patient care (including telephone call/ virtual visit): 10 minutes  [place], [place] [phone]

## 2018-06-18 ENCOUNTER — Other Ambulatory Visit: Payer: Self-pay

## 2018-06-18 DIAGNOSIS — Z01818 Encounter for other preprocedural examination: Secondary | ICD-10-CM

## 2018-06-18 NOTE — Progress Notes (Signed)
Orders for bmet for annual chest ct

## 2018-07-07 DIAGNOSIS — M5412 Radiculopathy, cervical region: Secondary | ICD-10-CM | POA: Diagnosis not present

## 2018-07-07 DIAGNOSIS — G7000 Myasthenia gravis without (acute) exacerbation: Secondary | ICD-10-CM | POA: Diagnosis not present

## 2018-07-07 DIAGNOSIS — G603 Idiopathic progressive neuropathy: Secondary | ICD-10-CM | POA: Diagnosis not present

## 2018-07-07 DIAGNOSIS — D519 Vitamin B12 deficiency anemia, unspecified: Secondary | ICD-10-CM | POA: Diagnosis not present

## 2018-07-22 ENCOUNTER — Other Ambulatory Visit: Payer: Self-pay

## 2018-07-22 ENCOUNTER — Ambulatory Visit (HOSPITAL_COMMUNITY)
Admission: RE | Admit: 2018-07-22 | Discharge: 2018-07-22 | Disposition: A | Discharge status: Home / Self Care | Payer: Medicare HMO | Source: Ambulatory Visit | Attending: Cardiovascular Disease | Admitting: Cardiovascular Disease

## 2018-07-22 DIAGNOSIS — I712 Thoracic aortic aneurysm, without rupture, unspecified: Secondary | ICD-10-CM

## 2018-07-22 DIAGNOSIS — I350 Nonrheumatic aortic (valve) stenosis: Secondary | ICD-10-CM | POA: Diagnosis not present

## 2018-07-22 DIAGNOSIS — R911 Solitary pulmonary nodule: Secondary | ICD-10-CM | POA: Diagnosis not present

## 2018-07-22 DIAGNOSIS — I2510 Atherosclerotic heart disease of native coronary artery without angina pectoris: Secondary | ICD-10-CM | POA: Diagnosis not present

## 2018-07-22 LAB — POCT I-STAT CREATININE: Creatinine, Ser: 1 mg/dL (ref 0.61–1.24)

## 2018-07-22 MED ORDER — IOHEXOL 350 MG/ML SOLN
100.0000 mL | Freq: Once | INTRAVENOUS | Status: AC | PRN
  Administered 2018-07-22: 09:00:00 100 mL via INTRAVENOUS

## 2018-07-22 NOTE — Progress Notes (Signed)
*  PRELIMINARY RESULTS* Echocardiogram 2D Echocardiogram has been performed.  Duane Burns 07/22/2018, 12:13 PM

## 2018-08-04 ENCOUNTER — Other Ambulatory Visit: Payer: Self-pay | Admitting: Family Medicine

## 2018-08-20 ENCOUNTER — Other Ambulatory Visit: Payer: Self-pay | Admitting: Family Medicine

## 2018-09-07 ENCOUNTER — Telehealth: Payer: Self-pay

## 2018-09-07 DIAGNOSIS — I350 Nonrheumatic aortic (valve) stenosis: Secondary | ICD-10-CM

## 2018-09-07 NOTE — Telephone Encounter (Signed)
chest CT due May 2021, echo due 12/2018    Order placed for echo

## 2018-09-07 NOTE — Telephone Encounter (Signed)
-----   Message from Orinda Kenner sent at 09/07/2018  2:12 PM EDT ----- Regarding: Order Needs order for Echo and Chest CTA per recall letter.  Thanks, [employer]

## 2018-10-29 ENCOUNTER — Other Ambulatory Visit: Payer: Self-pay | Admitting: Family Medicine

## 2018-11-12 ENCOUNTER — Other Ambulatory Visit: Payer: Self-pay | Admitting: Family Medicine

## 2018-11-16 ENCOUNTER — Telehealth: Payer: Self-pay | Admitting: Cardiovascular Disease

## 2018-11-16 DIAGNOSIS — R69 Illness, unspecified: Secondary | ICD-10-CM | POA: Diagnosis not present

## 2018-11-16 NOTE — Telephone Encounter (Signed)
Virtual Visit Pre-Appointment Phone Call  "(Name), I am calling you today to discuss your upcoming appointment. We are currently trying to limit exposure to the virus that causes COVID-19 by seeing patients at home rather than in the office."  1. "What is the BEST phone number to call the day of the visit?" - include this in appointment notes  2. Do you have or have access to (through a family member/friend) a smartphone with video capability that we can use for your visit?" a. If yes - list this number in appt notes as cell (if different from BEST phone #) and list the appointment type as a VIDEO visit in appointment notes b. If no - list the appointment type as a PHONE visit in appointment notes  3. Confirm consent - "In the setting of the current Covid19 crisis, you are scheduled for a (phone or video) visit with your provider on (date) at (time).  Just as we do with many in-office visits, in order for you to participate in this visit, we must obtain consent.  If you'd like, I can send this to your mychart (if signed up) or email for you to review.  Otherwise, I can obtain your verbal consent now.  All virtual visits are billed to your insurance company just like a normal visit would be.  By agreeing to a virtual visit, we'd like you to understand that the technology does not allow for your provider to perform an examination, and thus may limit your provider's ability to fully assess your condition. If your provider identifies any concerns that need to be evaluated in person, we will make arrangements to do so.  Finally, though the technology is pretty good, we cannot assure that it will always work on either your or our end, and in the setting of a video visit, we may have to convert it to a phone-only visit.  In either situation, we cannot ensure that we have a secure connection.  Are you willing to proceed?" STAFF: Did the patient verbally acknowledge consent to telehealth visit? Document  YES/NO here: yes  4. Advise patient to be prepared - "Two hours prior to your appointment, go ahead and check your blood pressure, pulse, oxygen saturation, and your weight (if you have the equipment to check those) and write them all down. When your visit starts, your provider will ask you for this information. If you have an Apple Watch or Kardia device, please plan to have heart rate information ready on the day of your appointment. Please have a pen and paper handy nearby the day of the visit as well."  5. Give patient instructions for MyChart download to smartphone OR [email or web address] as below if video visit (depending on what platform provider is using)  6. Inform patient they will receive a phone call 15 minutes prior to their appointment time (may be from unknown caller ID) so they should be prepared to answer    TELEPHONE CALL NOTE  LAYMON MAHALA has been deemed a candidate for a follow-up tele-health visit to limit community exposure during the Covid-19 pandemic. I spoke with the patient via phone to ensure availability of phone/video source, confirm preferred email & phone number, and discuss instructions and expectations.  I reminded SHAQVILLE GASQUE to be prepared with any vital sign and/or heart rhythm information that could potentially be obtained via home monitoring, at the time of his visit. I reminded MAMADI FERRARIS to expect a phone call prior to  his visit.  Terry L Goins 11/16/2018 9:25 AM

## 2018-11-17 ENCOUNTER — Encounter: Payer: Self-pay | Admitting: Cardiovascular Disease

## 2018-11-17 ENCOUNTER — Telehealth (INDEPENDENT_AMBULATORY_CARE_PROVIDER_SITE_OTHER): Payer: Medicare HMO | Admitting: Cardiovascular Disease

## 2018-11-17 ENCOUNTER — Other Ambulatory Visit: Payer: Self-pay

## 2018-11-17 VITALS — BP 141/58 | HR 68 | Ht 71.0 in | Wt 192.0 lb

## 2018-11-17 DIAGNOSIS — I77810 Thoracic aortic ectasia: Secondary | ICD-10-CM

## 2018-11-17 DIAGNOSIS — I712 Thoracic aortic aneurysm, without rupture, unspecified: Secondary | ICD-10-CM

## 2018-11-17 DIAGNOSIS — I350 Nonrheumatic aortic (valve) stenosis: Secondary | ICD-10-CM | POA: Diagnosis not present

## 2018-11-17 DIAGNOSIS — Z01818 Encounter for other preprocedural examination: Secondary | ICD-10-CM

## 2018-11-17 DIAGNOSIS — I10 Essential (primary) hypertension: Secondary | ICD-10-CM

## 2018-11-17 DIAGNOSIS — I4430 Unspecified atrioventricular block: Secondary | ICD-10-CM

## 2018-11-17 NOTE — Addendum Note (Signed)
Addended by: Barbarann Ehlers A on: 11/17/2018 10:53 AM   Modules accepted: Orders

## 2018-11-17 NOTE — Patient Instructions (Signed)
Medication Instructions: Your physician recommends that you continue on your current medications as directed. Please refer to the Current Medication list given to you today.   Labwork: BMET 1 week before chest CT in November  Procedures/Testing: Your physician has requested that you have an echocardiogram at the end of November. Echocardiography is a painless test that uses sound waves to create images of your heart. It provides your doctor with information about the size and shape of your heart and how well your heart's chambers and valves are working. This procedure takes approximately one hour. There are no restrictions for this procedure.   Schedule your Chest CT for the end of November  Follow-Up:  6 months virtual (PHONE) apt with Dr.Koneswaran  Any Additional Special Instructions Will Be Listed Below (If Applicable).     If you need a refill on your cardiac medications before your next appointment, please call your pharmacy.

## 2018-11-17 NOTE — Progress Notes (Signed)
Virtual Visit via Telephone Note   This visit type was conducted due to national recommendations for restrictions regarding the COVID-19 Pandemic (e.g. social distancing) in an effort to limit this patient's exposure and mitigate transmission in our community.  Due to his co-morbid illnesses, this patient is at least at moderate risk for complications without adequate follow up.  This format is felt to be most appropriate for this patient at this time.  The patient did not have access to video technology/had technical difficulties with video requiring transitioning to audio format only (telephone).  All issues noted in this document were discussed and addressed.  No physical exam could be performed with this format.  Please refer to the patient's chart for his  consent to telehealth for [facility].   Date:  11/17/2018   ID:  Duane Burns, DOB 1935/07/16, MRN [id number]  Patient Location: Home Provider Location: Home  PCP:  [name], DO  Cardiologist:  Kate Sable, MD  Electrophysiologist:  None   Evaluation Performed:  Follow-Up Visit  Chief Complaint:  Aortic stenosis  History of Present Illness:    Duane Burns is a 83 y.o. male with moderate to severe aortic stenosis, myasthenic gravis, and hyperlipidemia.  Cardiac monitoring in April 2019 demonstrated sinus rhythm with PACs and occasionallyblockedPACs.  The patient denies any symptoms of chest pain, palpitations, shortness of breath, lightheadedness, dizziness, leg swelling, orthopnea, PND, and syncope.  The patient does not have symptoms concerning for COVID-19 infection (fever, chills, cough, or new shortness of breath).   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  Social Hx:He and his wife have foster 24 kids. They have 5 grandchildren and 5 great-grandchildren.His wife met him when she was 83, got engaged at 76, and got married at the age of 64. They  have been married 16 years in July 2020.   Past Medical History:  Diagnosis Date  . Arthritis   . Cancer [facility])    History of skin cancer / hx Prostate cancer  . Dysphagia 11/10/2014  . Dysphonia 11/11/2014  . Foot drop, left    DUE TO NERVE INJURY IN BACK PER PT  . HNP (herniated nucleus pulposus with myelopathy), thoracic    HISTORY OF HNP  . Hyperlipidemia   . Hypertension   . Myasthenia gravis ([place]) 11/12/2014   versus Guillain-Barr syndrome.  . Pneumonia    JAN 2016  . Urgency of urination   . Urinary leakage    Past Surgical History:  Procedure Laterality Date  . BACK SURGERY     X 3  . CYSTOSCOPY WITH URETHRAL DILATATION N/A 05/10/2014   Procedure: CYSTOSCOPY WITH URETHRAL DILATATION;  Surgeon: Alexis Frock, MD;  Location: [facility];  Service: Urology;  Laterality: N/A;  with catheter placement, prior to surgery. procedure performed on the stretcher.  Marland Kitchen PROSTATE SURGERY    . SKIN CANCER EXCISION    . TONSILLECTOMY    . TOTAL HIP ARTHROPLASTY Right 05/10/2014   Procedure: RIGHT TOTAL HIP ARTHROPLASTY ANTERIOR APPROACH ;  Surgeon: Paralee Cancel, MD;  Location: [facility];  Service: Orthopedics;  Laterality: Right;     Current Meds  Medication Sig  . aspirin EC 81 MG tablet Take 81 mg by mouth daily.  . cholecalciferol (VITAMIN D) 1000 UNITS tablet Take 1,000 Units by mouth daily.  Marland Kitchen lisinopril (ZESTRIL) 5 MG tablet TAKE 1 TABLET BY MOUTH EVERY DAY  . Multiple Vitamin (MULTIVITAMIN WITH MINERALS)  TABS tablet Take 1 tablet by mouth daily.  . Omega-3 Fatty Acids (FISH OIL) 1200 MG CAPS Take 1-2 capsules (1,200-2,400 mg total) by mouth 2 (two) times daily. RESTART IN 1 WEEK IF YOUR SWALLOWING CONTINUES TO IMPROVE. Take 2 capsules in the morning and 1 capsule in the evening  . pravastatin (PRAVACHOL) 80 MG tablet Take 1 tablet (80 mg total) by mouth every evening. (Needs to be seen before next refill)  . predniSONE (DELTASONE) 10 MG tablet Take 10 mg by mouth daily.  Marland Kitchen  pyridostigmine (MESTINON) 60 MG tablet Take 60 mg by mouth 3 (three) times daily.      Allergies:   Patient has no known allergies.   Social History   Tobacco Use  . Smoking status: Former Smoker    Quit date: 05/05/1994    Years since quitting: 24.5  . Smokeless tobacco: Never Used  Substance Use Topics  . Alcohol use: No  . Drug use: No     Family Hx: The patient's family history includes Diabetes in an other family member; Hypertension in an other family member.  ROS:   Please see the history of present illness.     All other systems reviewed and are negative.   Prior CV studies:   The following studies were reviewed today:  Echocardiogram 07/22/2018:   1. The left ventricle has normal systolic function with an ejection fraction of 60-65%. The cavity size was normal. There is moderately increased left ventricular wall thickness. Left ventricular diastolic Doppler parameters are consistent with impaired  relaxation.  2. The right ventricle has normal systolic function. The cavity was normal. There is no increase in right ventricular wall thickness.  3. Left atrial size was moderately dilated.  4. Right atrial size was mildly dilated.  5. The aortic valve has an indeterminate number of cusps. Severely thickening of the aortic valve. Severe calcifcation of the aortic valve. Aortic valve regurgitation is mild to moderate by color flow Doppler. Moderate-severe stenosis of the aortic valve. Severe aortic annular calcification noted.  6. The mitral valve is abnormal. Mild thickening of the mitral valve leaflet. Mild calcification of the mitral valve leaflet. There is moderate to severe mitral annular calcification present. No evidence of mitral valve stenosis, with a calculated valve  area of 1.54 cm.  7. The tricuspid valve is grossly normal.  8. The aortic root is normal in size and structure.  9. Pulmonary hypertension is indeterminant, inadequate TR jet.   Labs/Other Tests  and Data Reviewed:    EKG:  No ECG reviewed.  Recent Labs: 01/27/2018: ALT 21; BUN 18; Hemoglobin 14.4; Platelets 219; Potassium 4.4; Sodium 142 07/22/2018: Creatinine, Ser 1.00   Recent Lipid Panel Lab Results  Component Value Date/Time   CHOL 171 01/27/2018 09:11 AM   TRIG 69 01/27/2018 09:11 AM   HDL 61 01/27/2018 09:11 AM   CHOLHDL 2.8 01/27/2018 09:11 AM   LDLCALC 96 01/27/2018 09:11 AM    Wt Readings from Last 3 Encounters:  11/17/18 192 lb (87.1 kg)  03/03/18 193 lb (87.5 kg)  01/30/18 192 lb (87.1 kg)     Objective:    Vital Signs:  BP (!) 141/58   Pulse 68   Ht _0  (1.803 m)   Wt 192 lb (87.1 kg)   BMI 26.78 kg/m    VITAL SIGNS:  reviewed  ASSESSMENT & PLAN:    1. Intermittent 2-1 AV block: Event monitoring results detailed above with sinus rhythm and PACs and occasionally  blocked PACs. He is asymptomatic. This is likely due to myasthenia gravis vs pyridostigmine itself. I would defer to neurology regarding alternative agents over pyridostigmineif deemed feasible.  2. Moderate to severe aortic stenosis: Symptomatically stable. I will repeat an echocardiogram in November.  I informed him to notify me should he develop symptoms such as chest pain, shortness of breath, or near syncope/syncope and to come and see me sooner.  3. Mild aortic root dilatation: 4.2 cm in diameter by CT on 07/22/18. I will repeat imaging in November.  4.  Hypertension: Mildly elevated. No changes today.   COVID-19 Education: The signs and symptoms of COVID-19 were discussed with the patient and how to seek care for testing (follow up with PCP or arrange E-visit).  The importance of social distancing was discussed today.  Time:   Today, I have spent 15 minutes with the patient with telehealth technology discussing the above problems.     Medication Adjustments/Labs and Tests Ordered: Current medicines are reviewed at length with the patient today.  Concerns regarding  medicines are outlined above.   Tests Ordered: No orders of the defined types were placed in this encounter.   Medication Changes: No orders of the defined types were placed in this encounter.   Follow Up:  Virtual Visit in 6 month(s)  Signed, Kate Sable, MD  11/17/2018 9:42 AM    [place]

## 2018-11-21 ENCOUNTER — Other Ambulatory Visit: Payer: Self-pay | Admitting: Family Medicine

## 2018-12-03 ENCOUNTER — Other Ambulatory Visit (HOSPITAL_COMMUNITY): Payer: Medicare HMO

## 2018-12-13 DIAGNOSIS — R05 Cough: Secondary | ICD-10-CM | POA: Diagnosis not present

## 2018-12-13 DIAGNOSIS — N200 Calculus of kidney: Secondary | ICD-10-CM | POA: Diagnosis not present

## 2018-12-13 DIAGNOSIS — R0602 Shortness of breath: Secondary | ICD-10-CM | POA: Diagnosis not present

## 2018-12-13 DIAGNOSIS — A419 Sepsis, unspecified organism: Secondary | ICD-10-CM | POA: Diagnosis not present

## 2018-12-13 DIAGNOSIS — E7800 Pure hypercholesterolemia, unspecified: Secondary | ICD-10-CM | POA: Diagnosis not present

## 2018-12-13 DIAGNOSIS — J986 Disorders of diaphragm: Secondary | ICD-10-CM | POA: Diagnosis not present

## 2018-12-13 DIAGNOSIS — R4781 Slurred speech: Secondary | ICD-10-CM | POA: Diagnosis not present

## 2018-12-13 DIAGNOSIS — M6282 Rhabdomyolysis: Secondary | ICD-10-CM | POA: Diagnosis not present

## 2018-12-13 DIAGNOSIS — R69 Illness, unspecified: Secondary | ICD-10-CM | POA: Diagnosis not present

## 2018-12-13 DIAGNOSIS — R410 Disorientation, unspecified: Secondary | ICD-10-CM | POA: Diagnosis not present

## 2018-12-13 DIAGNOSIS — R4789 Other speech disturbances: Secondary | ICD-10-CM | POA: Diagnosis not present

## 2018-12-13 DIAGNOSIS — N19 Unspecified kidney failure: Secondary | ICD-10-CM | POA: Diagnosis not present

## 2018-12-13 DIAGNOSIS — E876 Hypokalemia: Secondary | ICD-10-CM | POA: Diagnosis not present

## 2018-12-13 DIAGNOSIS — Z79899 Other long term (current) drug therapy: Secondary | ICD-10-CM | POA: Diagnosis not present

## 2018-12-13 DIAGNOSIS — K5730 Diverticulosis of large intestine without perforation or abscess without bleeding: Secondary | ICD-10-CM | POA: Diagnosis not present

## 2018-12-13 DIAGNOSIS — I959 Hypotension, unspecified: Secondary | ICD-10-CM | POA: Diagnosis not present

## 2018-12-13 DIAGNOSIS — J9811 Atelectasis: Secondary | ICD-10-CM | POA: Diagnosis not present

## 2018-12-13 DIAGNOSIS — R4182 Altered mental status, unspecified: Secondary | ICD-10-CM | POA: Diagnosis not present

## 2018-12-13 DIAGNOSIS — R0902 Hypoxemia: Secondary | ICD-10-CM | POA: Diagnosis not present

## 2018-12-13 DIAGNOSIS — R1011 Right upper quadrant pain: Secondary | ICD-10-CM | POA: Diagnosis not present

## 2018-12-14 ENCOUNTER — Telehealth: Payer: Self-pay | Admitting: Family Medicine

## 2018-12-14 DIAGNOSIS — R05 Cough: Secondary | ICD-10-CM | POA: Diagnosis not present

## 2018-12-14 DIAGNOSIS — Z20828 Contact with and (suspected) exposure to other viral communicable diseases: Secondary | ICD-10-CM | POA: Diagnosis not present

## 2018-12-14 DIAGNOSIS — N19 Unspecified kidney failure: Secondary | ICD-10-CM | POA: Diagnosis not present

## 2018-12-14 DIAGNOSIS — R112 Nausea with vomiting, unspecified: Secondary | ICD-10-CM | POA: Diagnosis not present

## 2018-12-14 DIAGNOSIS — R778 Other specified abnormalities of plasma proteins: Secondary | ICD-10-CM | POA: Diagnosis not present

## 2018-12-14 DIAGNOSIS — N200 Calculus of kidney: Secondary | ICD-10-CM | POA: Diagnosis not present

## 2018-12-14 DIAGNOSIS — R1084 Generalized abdominal pain: Secondary | ICD-10-CM | POA: Diagnosis not present

## 2018-12-14 DIAGNOSIS — E7800 Pure hypercholesterolemia, unspecified: Secondary | ICD-10-CM | POA: Diagnosis not present

## 2018-12-14 DIAGNOSIS — G9341 Metabolic encephalopathy: Secondary | ICD-10-CM | POA: Diagnosis not present

## 2018-12-14 DIAGNOSIS — G7000 Myasthenia gravis without (acute) exacerbation: Secondary | ICD-10-CM | POA: Diagnosis not present

## 2018-12-14 DIAGNOSIS — E876 Hypokalemia: Secondary | ICD-10-CM | POA: Diagnosis not present

## 2018-12-14 DIAGNOSIS — A419 Sepsis, unspecified organism: Secondary | ICD-10-CM | POA: Diagnosis not present

## 2018-12-14 DIAGNOSIS — R1011 Right upper quadrant pain: Secondary | ICD-10-CM | POA: Diagnosis not present

## 2018-12-14 DIAGNOSIS — K8042 Calculus of bile duct with acute cholecystitis without obstruction: Secondary | ICD-10-CM | POA: Diagnosis not present

## 2018-12-14 DIAGNOSIS — N179 Acute kidney failure, unspecified: Secondary | ICD-10-CM | POA: Diagnosis not present

## 2018-12-14 DIAGNOSIS — R4182 Altered mental status, unspecified: Secondary | ICD-10-CM | POA: Diagnosis not present

## 2018-12-14 DIAGNOSIS — J986 Disorders of diaphragm: Secondary | ICD-10-CM | POA: Diagnosis not present

## 2018-12-14 DIAGNOSIS — M6282 Rhabdomyolysis: Secondary | ICD-10-CM | POA: Diagnosis not present

## 2018-12-14 DIAGNOSIS — A4151 Sepsis due to Escherichia coli [E. coli]: Secondary | ICD-10-CM | POA: Diagnosis not present

## 2018-12-14 DIAGNOSIS — I350 Nonrheumatic aortic (valve) stenosis: Secondary | ICD-10-CM | POA: Diagnosis not present

## 2018-12-14 DIAGNOSIS — R4789 Other speech disturbances: Secondary | ICD-10-CM | POA: Diagnosis not present

## 2018-12-14 DIAGNOSIS — K5730 Diverticulosis of large intestine without perforation or abscess without bleeding: Secondary | ICD-10-CM | POA: Diagnosis not present

## 2018-12-14 DIAGNOSIS — I248 Other forms of acute ischemic heart disease: Secondary | ICD-10-CM | POA: Diagnosis not present

## 2018-12-14 DIAGNOSIS — R410 Disorientation, unspecified: Secondary | ICD-10-CM | POA: Diagnosis not present

## 2018-12-14 DIAGNOSIS — Z79899 Other long term (current) drug therapy: Secondary | ICD-10-CM | POA: Diagnosis not present

## 2018-12-14 DIAGNOSIS — R6520 Severe sepsis without septic shock: Secondary | ICD-10-CM | POA: Diagnosis not present

## 2018-12-14 DIAGNOSIS — R0602 Shortness of breath: Secondary | ICD-10-CM | POA: Diagnosis not present

## 2018-12-14 DIAGNOSIS — R4781 Slurred speech: Secondary | ICD-10-CM | POA: Diagnosis not present

## 2018-12-14 DIAGNOSIS — J9811 Atelectasis: Secondary | ICD-10-CM | POA: Diagnosis not present

## 2018-12-14 DIAGNOSIS — N390 Urinary tract infection, site not specified: Secondary | ICD-10-CM | POA: Diagnosis not present

## 2018-12-14 DIAGNOSIS — R7989 Other specified abnormal findings of blood chemistry: Secondary | ICD-10-CM | POA: Diagnosis not present

## 2018-12-14 DIAGNOSIS — R197 Diarrhea, unspecified: Secondary | ICD-10-CM | POA: Diagnosis not present

## 2018-12-14 NOTE — Telephone Encounter (Signed)
Patient of Dr Darnell Level. FYI

## 2018-12-15 DIAGNOSIS — R1084 Generalized abdominal pain: Secondary | ICD-10-CM | POA: Diagnosis not present

## 2018-12-15 DIAGNOSIS — G9341 Metabolic encephalopathy: Secondary | ICD-10-CM | POA: Diagnosis not present

## 2018-12-15 DIAGNOSIS — J986 Disorders of diaphragm: Secondary | ICD-10-CM | POA: Diagnosis not present

## 2018-12-15 DIAGNOSIS — N179 Acute kidney failure, unspecified: Secondary | ICD-10-CM | POA: Diagnosis not present

## 2018-12-15 DIAGNOSIS — Z20828 Contact with and (suspected) exposure to other viral communicable diseases: Secondary | ICD-10-CM | POA: Diagnosis not present

## 2018-12-15 DIAGNOSIS — G7000 Myasthenia gravis without (acute) exacerbation: Secondary | ICD-10-CM | POA: Diagnosis not present

## 2018-12-15 DIAGNOSIS — R7989 Other specified abnormal findings of blood chemistry: Secondary | ICD-10-CM | POA: Diagnosis not present

## 2018-12-15 DIAGNOSIS — I248 Other forms of acute ischemic heart disease: Secondary | ICD-10-CM | POA: Diagnosis not present

## 2018-12-15 DIAGNOSIS — A4151 Sepsis due to Escherichia coli [E. coli]: Secondary | ICD-10-CM | POA: Diagnosis not present

## 2018-12-15 DIAGNOSIS — R6520 Severe sepsis without septic shock: Secondary | ICD-10-CM | POA: Diagnosis not present

## 2018-12-15 DIAGNOSIS — R112 Nausea with vomiting, unspecified: Secondary | ICD-10-CM | POA: Diagnosis not present

## 2018-12-15 DIAGNOSIS — R0602 Shortness of breath: Secondary | ICD-10-CM | POA: Diagnosis not present

## 2018-12-15 DIAGNOSIS — I350 Nonrheumatic aortic (valve) stenosis: Secondary | ICD-10-CM | POA: Diagnosis not present

## 2018-12-15 DIAGNOSIS — R197 Diarrhea, unspecified: Secondary | ICD-10-CM | POA: Diagnosis not present

## 2018-12-15 DIAGNOSIS — N390 Urinary tract infection, site not specified: Secondary | ICD-10-CM | POA: Diagnosis not present

## 2018-12-15 DIAGNOSIS — R778 Other specified abnormalities of plasma proteins: Secondary | ICD-10-CM | POA: Diagnosis not present

## 2018-12-15 DIAGNOSIS — K8042 Calculus of bile duct with acute cholecystitis without obstruction: Secondary | ICD-10-CM | POA: Diagnosis not present

## 2018-12-16 DIAGNOSIS — G7000 Myasthenia gravis without (acute) exacerbation: Secondary | ICD-10-CM | POA: Diagnosis not present

## 2018-12-16 DIAGNOSIS — N179 Acute kidney failure, unspecified: Secondary | ICD-10-CM | POA: Diagnosis not present

## 2018-12-16 DIAGNOSIS — R7989 Other specified abnormal findings of blood chemistry: Secondary | ICD-10-CM | POA: Diagnosis not present

## 2018-12-16 DIAGNOSIS — A419 Sepsis, unspecified organism: Secondary | ICD-10-CM | POA: Diagnosis not present

## 2018-12-16 DIAGNOSIS — R112 Nausea with vomiting, unspecified: Secondary | ICD-10-CM | POA: Diagnosis not present

## 2018-12-16 DIAGNOSIS — R197 Diarrhea, unspecified: Secondary | ICD-10-CM | POA: Diagnosis not present

## 2018-12-16 DIAGNOSIS — R7881 Bacteremia: Secondary | ICD-10-CM | POA: Diagnosis not present

## 2018-12-16 DIAGNOSIS — R1084 Generalized abdominal pain: Secondary | ICD-10-CM | POA: Diagnosis not present

## 2018-12-16 DIAGNOSIS — K810 Acute cholecystitis: Secondary | ICD-10-CM | POA: Diagnosis not present

## 2018-12-16 DIAGNOSIS — R6520 Severe sepsis without septic shock: Secondary | ICD-10-CM | POA: Diagnosis not present

## 2018-12-16 DIAGNOSIS — R778 Other specified abnormalities of plasma proteins: Secondary | ICD-10-CM | POA: Diagnosis not present

## 2018-12-17 DIAGNOSIS — A419 Sepsis, unspecified organism: Secondary | ICD-10-CM | POA: Diagnosis not present

## 2018-12-17 DIAGNOSIS — G7000 Myasthenia gravis without (acute) exacerbation: Secondary | ICD-10-CM | POA: Diagnosis not present

## 2018-12-17 DIAGNOSIS — R0989 Other specified symptoms and signs involving the circulatory and respiratory systems: Secondary | ICD-10-CM | POA: Diagnosis not present

## 2018-12-17 DIAGNOSIS — N179 Acute kidney failure, unspecified: Secondary | ICD-10-CM | POA: Diagnosis not present

## 2018-12-17 DIAGNOSIS — R7881 Bacteremia: Secondary | ICD-10-CM | POA: Diagnosis not present

## 2018-12-17 DIAGNOSIS — R6520 Severe sepsis without septic shock: Secondary | ICD-10-CM | POA: Diagnosis not present

## 2018-12-17 DIAGNOSIS — K810 Acute cholecystitis: Secondary | ICD-10-CM | POA: Diagnosis not present

## 2018-12-17 DIAGNOSIS — R778 Other specified abnormalities of plasma proteins: Secondary | ICD-10-CM | POA: Diagnosis not present

## 2018-12-17 DIAGNOSIS — R0600 Dyspnea, unspecified: Secondary | ICD-10-CM | POA: Diagnosis not present

## 2018-12-17 DIAGNOSIS — I700 Atherosclerosis of aorta: Secondary | ICD-10-CM | POA: Diagnosis not present

## 2018-12-17 DIAGNOSIS — R918 Other nonspecific abnormal finding of lung field: Secondary | ICD-10-CM | POA: Diagnosis not present

## 2018-12-18 DIAGNOSIS — M25552 Pain in left hip: Secondary | ICD-10-CM | POA: Diagnosis not present

## 2018-12-18 DIAGNOSIS — R778 Other specified abnormalities of plasma proteins: Secondary | ICD-10-CM | POA: Diagnosis not present

## 2018-12-18 DIAGNOSIS — M79605 Pain in left leg: Secondary | ICD-10-CM | POA: Diagnosis not present

## 2018-12-18 DIAGNOSIS — R6520 Severe sepsis without septic shock: Secondary | ICD-10-CM | POA: Diagnosis not present

## 2018-12-18 DIAGNOSIS — M79652 Pain in left thigh: Secondary | ICD-10-CM | POA: Diagnosis not present

## 2018-12-18 DIAGNOSIS — A419 Sepsis, unspecified organism: Secondary | ICD-10-CM | POA: Diagnosis not present

## 2018-12-18 DIAGNOSIS — G7000 Myasthenia gravis without (acute) exacerbation: Secondary | ICD-10-CM | POA: Diagnosis not present

## 2018-12-18 DIAGNOSIS — M1612 Unilateral primary osteoarthritis, left hip: Secondary | ICD-10-CM | POA: Diagnosis not present

## 2018-12-18 DIAGNOSIS — R7881 Bacteremia: Secondary | ICD-10-CM | POA: Diagnosis not present

## 2018-12-18 DIAGNOSIS — N179 Acute kidney failure, unspecified: Secondary | ICD-10-CM | POA: Diagnosis not present

## 2018-12-19 ENCOUNTER — Other Ambulatory Visit: Payer: Self-pay | Admitting: Family Medicine

## 2018-12-19 DIAGNOSIS — R6520 Severe sepsis without septic shock: Secondary | ICD-10-CM | POA: Diagnosis not present

## 2018-12-19 DIAGNOSIS — R7881 Bacteremia: Secondary | ICD-10-CM | POA: Diagnosis not present

## 2018-12-19 DIAGNOSIS — A419 Sepsis, unspecified organism: Secondary | ICD-10-CM | POA: Diagnosis not present

## 2018-12-19 DIAGNOSIS — N179 Acute kidney failure, unspecified: Secondary | ICD-10-CM | POA: Diagnosis not present

## 2018-12-19 DIAGNOSIS — G7000 Myasthenia gravis without (acute) exacerbation: Secondary | ICD-10-CM | POA: Diagnosis not present

## 2018-12-19 DIAGNOSIS — R778 Other specified abnormalities of plasma proteins: Secondary | ICD-10-CM | POA: Diagnosis not present

## 2018-12-20 DIAGNOSIS — R7881 Bacteremia: Secondary | ICD-10-CM | POA: Diagnosis not present

## 2018-12-20 DIAGNOSIS — N179 Acute kidney failure, unspecified: Secondary | ICD-10-CM | POA: Diagnosis not present

## 2018-12-20 DIAGNOSIS — A419 Sepsis, unspecified organism: Secondary | ICD-10-CM | POA: Diagnosis not present

## 2018-12-20 DIAGNOSIS — G7000 Myasthenia gravis without (acute) exacerbation: Secondary | ICD-10-CM | POA: Diagnosis not present

## 2018-12-20 DIAGNOSIS — R6520 Severe sepsis without septic shock: Secondary | ICD-10-CM | POA: Diagnosis not present

## 2018-12-20 DIAGNOSIS — R778 Other specified abnormalities of plasma proteins: Secondary | ICD-10-CM | POA: Diagnosis not present

## 2018-12-21 DIAGNOSIS — R6520 Severe sepsis without septic shock: Secondary | ICD-10-CM | POA: Diagnosis not present

## 2018-12-21 DIAGNOSIS — R7881 Bacteremia: Secondary | ICD-10-CM | POA: Diagnosis not present

## 2018-12-21 DIAGNOSIS — G7000 Myasthenia gravis without (acute) exacerbation: Secondary | ICD-10-CM | POA: Diagnosis not present

## 2018-12-21 DIAGNOSIS — A419 Sepsis, unspecified organism: Secondary | ICD-10-CM | POA: Diagnosis not present

## 2018-12-21 DIAGNOSIS — R778 Other specified abnormalities of plasma proteins: Secondary | ICD-10-CM | POA: Diagnosis not present

## 2018-12-21 DIAGNOSIS — N179 Acute kidney failure, unspecified: Secondary | ICD-10-CM | POA: Diagnosis not present

## 2018-12-21 NOTE — Telephone Encounter (Signed)
Left patient message to make appt.

## 2018-12-21 NOTE — Telephone Encounter (Signed)
[place]. NTBS 30 days given 11/23/18

## 2018-12-22 DIAGNOSIS — I10 Essential (primary) hypertension: Secondary | ICD-10-CM | POA: Diagnosis not present

## 2018-12-22 DIAGNOSIS — E785 Hyperlipidemia, unspecified: Secondary | ICD-10-CM | POA: Diagnosis not present

## 2018-12-22 DIAGNOSIS — N179 Acute kidney failure, unspecified: Secondary | ICD-10-CM | POA: Diagnosis not present

## 2018-12-22 DIAGNOSIS — I350 Nonrheumatic aortic (valve) stenosis: Secondary | ICD-10-CM | POA: Diagnosis not present

## 2018-12-22 DIAGNOSIS — B9620 Unspecified Escherichia coli [E. coli] as the cause of diseases classified elsewhere: Secondary | ICD-10-CM | POA: Diagnosis not present

## 2018-12-22 DIAGNOSIS — R7881 Bacteremia: Secondary | ICD-10-CM | POA: Diagnosis not present

## 2018-12-22 DIAGNOSIS — G7000 Myasthenia gravis without (acute) exacerbation: Secondary | ICD-10-CM | POA: Diagnosis not present

## 2018-12-23 DIAGNOSIS — Z96641 Presence of right artificial hip joint: Secondary | ICD-10-CM | POA: Diagnosis not present

## 2018-12-23 DIAGNOSIS — R600 Localized edema: Secondary | ICD-10-CM | POA: Diagnosis not present

## 2018-12-23 DIAGNOSIS — N179 Acute kidney failure, unspecified: Secondary | ICD-10-CM | POA: Diagnosis not present

## 2018-12-23 DIAGNOSIS — S72002A Fracture of unspecified part of neck of left femur, initial encounter for closed fracture: Secondary | ICD-10-CM | POA: Diagnosis not present

## 2018-12-23 DIAGNOSIS — I350 Nonrheumatic aortic (valve) stenosis: Secondary | ICD-10-CM | POA: Diagnosis not present

## 2018-12-23 DIAGNOSIS — E785 Hyperlipidemia, unspecified: Secondary | ICD-10-CM | POA: Diagnosis not present

## 2018-12-23 DIAGNOSIS — I10 Essential (primary) hypertension: Secondary | ICD-10-CM | POA: Diagnosis not present

## 2018-12-23 DIAGNOSIS — G7000 Myasthenia gravis without (acute) exacerbation: Secondary | ICD-10-CM | POA: Diagnosis not present

## 2018-12-23 DIAGNOSIS — B9620 Unspecified Escherichia coli [E. coli] as the cause of diseases classified elsewhere: Secondary | ICD-10-CM | POA: Diagnosis not present

## 2018-12-23 DIAGNOSIS — S76012A Strain of muscle, fascia and tendon of left hip, initial encounter: Secondary | ICD-10-CM | POA: Diagnosis not present

## 2018-12-23 DIAGNOSIS — R7881 Bacteremia: Secondary | ICD-10-CM | POA: Diagnosis not present

## 2018-12-23 DIAGNOSIS — R102 Pelvic and perineal pain: Secondary | ICD-10-CM | POA: Diagnosis not present

## 2018-12-23 DIAGNOSIS — M1612 Unilateral primary osteoarthritis, left hip: Secondary | ICD-10-CM | POA: Diagnosis not present

## 2018-12-24 DIAGNOSIS — I10 Essential (primary) hypertension: Secondary | ICD-10-CM | POA: Diagnosis not present

## 2018-12-24 DIAGNOSIS — B9620 Unspecified Escherichia coli [E. coli] as the cause of diseases classified elsewhere: Secondary | ICD-10-CM | POA: Diagnosis not present

## 2018-12-24 DIAGNOSIS — N179 Acute kidney failure, unspecified: Secondary | ICD-10-CM | POA: Diagnosis not present

## 2018-12-24 DIAGNOSIS — I350 Nonrheumatic aortic (valve) stenosis: Secondary | ICD-10-CM | POA: Diagnosis not present

## 2018-12-24 DIAGNOSIS — E785 Hyperlipidemia, unspecified: Secondary | ICD-10-CM | POA: Diagnosis not present

## 2018-12-24 DIAGNOSIS — R7881 Bacteremia: Secondary | ICD-10-CM | POA: Diagnosis not present

## 2018-12-24 DIAGNOSIS — R6520 Severe sepsis without septic shock: Secondary | ICD-10-CM | POA: Diagnosis not present

## 2018-12-24 DIAGNOSIS — G7000 Myasthenia gravis without (acute) exacerbation: Secondary | ICD-10-CM | POA: Diagnosis not present

## 2018-12-25 ENCOUNTER — Telehealth: Payer: Self-pay | Admitting: Family Medicine

## 2018-12-28 ENCOUNTER — Ambulatory Visit (INDEPENDENT_AMBULATORY_CARE_PROVIDER_SITE_OTHER): Payer: Medicare HMO | Admitting: Family Medicine

## 2018-12-28 ENCOUNTER — Other Ambulatory Visit: Payer: Self-pay | Admitting: Family Medicine

## 2018-12-28 VITALS — BP 152/82 | HR 78 | Temp 97.9°F | Resp 18

## 2018-12-28 DIAGNOSIS — M1612 Unilateral primary osteoarthritis, left hip: Secondary | ICD-10-CM | POA: Diagnosis not present

## 2018-12-28 DIAGNOSIS — R21 Rash and other nonspecific skin eruption: Secondary | ICD-10-CM

## 2018-12-28 DIAGNOSIS — Z09 Encounter for follow-up examination after completed treatment for conditions other than malignant neoplasm: Secondary | ICD-10-CM

## 2018-12-28 MED ORDER — PRAVASTATIN SODIUM 80 MG PO TABS: 80.0000 mg | ORAL_TABLET | Freq: Every evening | ORAL | 0 refills | Status: AC

## 2018-12-28 NOTE — Progress Notes (Signed)
Telephone visit  Subjective: CC: Hospital follow-up PCP: Duane Norlander, DO PVX:YIAXK [name] is a 83 y.o. male calls for telephone consult today. Patient provides verbal consent for consult held via phone.  Location of patient: home Location of provider: Working remotely from home Others present for call: wife, Duane Burns  1. Hospital follow-up Patient was hospitalized for 10 days in [place] for E. coli bacteremia of unknown etiology, ?closed fracture of left femur.  Initial presentation was altered mental status at [facility] emergency department.  At that time he was complaining of right upper quadrant pain with associated nausea, vomiting and diarrhea and generalized weakness.  His blood pressure was noted to be significantly hypotensive at 82/53 with both elevated ALT, AST and alk phos.  White blood cell count was significantly elevated at 43 and lactic acid was also elevated at greater than 5.    CT suggestive of cholecystitis at [facility] but upon arrival to [place] his repeat scan was not consistent with acute gallbladder pathology.  However they did recommend consideration for eval with GEN surgery on the outpatient setting for possible cholecystectomy for possible chronic cholecystitis.  His IV antibiotics were continued and repeat blood culture was negative for growth.  He was transitioned over to oral antibiotics (Augmentin) to complete a 14-day course given severe sepsis.    The fracture thought to be noted in the left femoral neck was actually not present on repeat x-ray and MRI and recheck by orthopedics.  However, left hip replacement was recommended in the future.  Patient was to follow-up with Duane Burns. Ward on the outpatient setting.  He notes this is scheduled.  He was discharged home with home health. PT came today but the family didn't feel that he needed it.  PT thought he was doing well.  He notes that he is getting around without difficulty.  They were  presently surprised at how well he was doing outpatient.  He notes a left sided thigh rash that is mild.  Denies any increased warmth, significant erythema, itching or pain.  He simply wanted to mention it.  He reports eating well since discharge from the hospital.  Bowel movements are normal.  Denies any abdominal pain, nausea, vomiting, fever.  He has a couple of days left of his Augmentin to finish.  ROS: Per HPI  No Known Allergies Past Medical History:  Diagnosis Date  . Arthritis   . Cancer [facility])    History of skin cancer / hx Prostate cancer  . Dysphagia 11/10/2014  . Dysphonia 11/11/2014  . Foot drop, left    DUE TO NERVE INJURY IN BACK PER PT  . HNP (herniated nucleus pulposus with myelopathy), thoracic    HISTORY OF HNP  . Hyperlipidemia   . Hypertension   . Myasthenia gravis ([place]) 11/12/2014   versus Guillain-Barr syndrome.  . Pneumonia    JAN 2016  . Urgency of urination   . Urinary leakage     Current Outpatient Medications:  .  amoxicillin-clavulanate (AUGMENTIN) 875-125 MG tablet, Take 1 tablet by mouth 2 (two) times daily., Disp: , Rfl:  .  aspirin EC 81 MG tablet, Take 81 mg by mouth daily., Disp: , Rfl:  .  cholecalciferol (VITAMIN D) 1000 UNITS tablet, Take 1,000 Units by mouth daily., Disp: , Rfl:  .  lisinopril (ZESTRIL) 5 MG tablet, TAKE 1 TABLET BY MOUTH EVERY DAY, Disp: 90 tablet, Rfl: 0 .  Multiple Vitamin (MULTIVITAMIN WITH MINERALS) TABS tablet, Take 1  tablet by mouth daily., Disp: , Rfl:  .  Omega-3 Fatty Acids (FISH OIL) 1200 MG CAPS, Take 1-2 capsules (1,200-2,400 mg total) by mouth 2 (two) times daily. RESTART IN 1 WEEK IF YOUR SWALLOWING CONTINUES TO IMPROVE. Take 2 capsules in the morning and 1 capsule in the evening, Disp: , Rfl:  .  pravastatin (PRAVACHOL) 80 MG tablet, TAKE 1 TABLET (80 MG TOTAL) BY MOUTH EVERY EVENING. (NEEDS TO BE SEEN BEFORE NEXT REFILL), Disp: 30 tablet, Rfl: 0 .  predniSONE (DELTASONE) 10 MG tablet, Take 10 mg by mouth  daily., Disp: , Rfl: 5 .  pyridostigmine (MESTINON) 60 MG tablet, Take 60 mg by mouth 3 (three) times daily. , Disp: , Rfl:   Assessment/ Plan: 83 y.o. male   1. Hospital discharge follow-up He is doing extremely well after discharge from hospital.  We discussed completing the antibiotics as prescribed.  I reviewed his notes in the EMR.  2. Rash and nonspecific skin eruption Uncertain etiology.  At this point is not bothering him.  Nothing to suggest DVT at this point but I did discuss red flag signs and symptoms warranting further evaluation with both he and his wife.  They voiced good understanding.  3. Primary osteoarthritis of left hip He has outpatient follow-up with Duane Burns for consideration of elective hip replacement given degenerative hip disease.   Start time: 1:13pm End time: 1:25pm  Total time spent on patient care (including telephone call/ virtual visit): 19 minutes  [place], [place] [phone]

## 2019-01-15 ENCOUNTER — Inpatient Hospital Stay (HOSPITAL_COMMUNITY)
Admission: EM | Admit: 2019-01-15 | Discharge: 2019-02-26 | DRG: 870 | Disposition: E | Payer: Medicare HMO | Attending: Internal Medicine | Admitting: Internal Medicine

## 2019-01-15 ENCOUNTER — Inpatient Hospital Stay (HOSPITAL_COMMUNITY): Payer: Medicare HMO

## 2019-01-15 ENCOUNTER — Encounter (HOSPITAL_COMMUNITY): Payer: Self-pay | Admitting: Emergency Medicine

## 2019-01-15 ENCOUNTER — Other Ambulatory Visit: Payer: Self-pay

## 2019-01-15 ENCOUNTER — Emergency Department (HOSPITAL_COMMUNITY): Payer: Medicare HMO

## 2019-01-15 DIAGNOSIS — J9690 Respiratory failure, unspecified, unspecified whether with hypoxia or hypercapnia: Secondary | ICD-10-CM

## 2019-01-15 DIAGNOSIS — I350 Nonrheumatic aortic (valve) stenosis: Secondary | ICD-10-CM | POA: Diagnosis not present

## 2019-01-15 DIAGNOSIS — Z9289 Personal history of other medical treatment: Secondary | ICD-10-CM | POA: Diagnosis not present

## 2019-01-15 DIAGNOSIS — N170 Acute kidney failure with tubular necrosis: Secondary | ICD-10-CM | POA: Diagnosis not present

## 2019-01-15 DIAGNOSIS — Z4682 Encounter for fitting and adjustment of non-vascular catheter: Secondary | ICD-10-CM | POA: Diagnosis not present

## 2019-01-15 DIAGNOSIS — I63419 Cerebral infarction due to embolism of unspecified middle cerebral artery: Secondary | ICD-10-CM | POA: Diagnosis not present

## 2019-01-15 DIAGNOSIS — A419 Sepsis, unspecified organism: Secondary | ICD-10-CM | POA: Diagnosis not present

## 2019-01-15 DIAGNOSIS — I63443 Cerebral infarction due to embolism of bilateral cerebellar arteries: Secondary | ICD-10-CM | POA: Diagnosis not present

## 2019-01-15 DIAGNOSIS — I361 Nonrheumatic tricuspid (valve) insufficiency: Secondary | ICD-10-CM | POA: Diagnosis not present

## 2019-01-15 DIAGNOSIS — Z0189 Encounter for other specified special examinations: Secondary | ICD-10-CM

## 2019-01-15 DIAGNOSIS — R109 Unspecified abdominal pain: Secondary | ICD-10-CM

## 2019-01-15 DIAGNOSIS — I447 Left bundle-branch block, unspecified: Secondary | ICD-10-CM | POA: Diagnosis not present

## 2019-01-15 DIAGNOSIS — I441 Atrioventricular block, second degree: Secondary | ICD-10-CM | POA: Diagnosis present

## 2019-01-15 DIAGNOSIS — R509 Fever, unspecified: Secondary | ICD-10-CM | POA: Diagnosis not present

## 2019-01-15 DIAGNOSIS — R402314 Coma scale, best motor response, none, 24 hours or more after hospital admission: Secondary | ICD-10-CM | POA: Diagnosis not present

## 2019-01-15 DIAGNOSIS — I639 Cerebral infarction, unspecified: Secondary | ICD-10-CM

## 2019-01-15 DIAGNOSIS — R52 Pain, unspecified: Secondary | ICD-10-CM | POA: Diagnosis not present

## 2019-01-15 DIAGNOSIS — I472 Ventricular tachycardia: Secondary | ICD-10-CM | POA: Diagnosis not present

## 2019-01-15 DIAGNOSIS — D72829 Elevated white blood cell count, unspecified: Secondary | ICD-10-CM | POA: Diagnosis not present

## 2019-01-15 DIAGNOSIS — E872 Acidosis: Secondary | ICD-10-CM | POA: Diagnosis not present

## 2019-01-15 DIAGNOSIS — R1011 Right upper quadrant pain: Secondary | ICD-10-CM | POA: Diagnosis not present

## 2019-01-15 DIAGNOSIS — I509 Heart failure, unspecified: Secondary | ICD-10-CM

## 2019-01-15 DIAGNOSIS — Z87891 Personal history of nicotine dependence: Secondary | ICD-10-CM

## 2019-01-15 DIAGNOSIS — I5023 Acute on chronic systolic (congestive) heart failure: Secondary | ICD-10-CM | POA: Diagnosis not present

## 2019-01-15 DIAGNOSIS — I10 Essential (primary) hypertension: Secondary | ICD-10-CM | POA: Diagnosis present

## 2019-01-15 DIAGNOSIS — G9340 Encephalopathy, unspecified: Secondary | ICD-10-CM

## 2019-01-15 DIAGNOSIS — I480 Paroxysmal atrial fibrillation: Secondary | ICD-10-CM | POA: Diagnosis not present

## 2019-01-15 DIAGNOSIS — Z978 Presence of other specified devices: Secondary | ICD-10-CM | POA: Diagnosis not present

## 2019-01-15 DIAGNOSIS — Z20828 Contact with and (suspected) exposure to other viral communicable diseases: Secondary | ICD-10-CM | POA: Diagnosis not present

## 2019-01-15 DIAGNOSIS — R000 Tachycardia, unspecified: Secondary | ICD-10-CM | POA: Diagnosis not present

## 2019-01-15 DIAGNOSIS — A4151 Sepsis due to Escherichia coli [E. coli]: Secondary | ICD-10-CM | POA: Diagnosis not present

## 2019-01-15 DIAGNOSIS — Z8546 Personal history of malignant neoplasm of prostate: Secondary | ICD-10-CM

## 2019-01-15 DIAGNOSIS — Z4659 Encounter for fitting and adjustment of other gastrointestinal appliance and device: Secondary | ICD-10-CM

## 2019-01-15 DIAGNOSIS — G7000 Myasthenia gravis without (acute) exacerbation: Secondary | ICD-10-CM | POA: Diagnosis not present

## 2019-01-15 DIAGNOSIS — E785 Hyperlipidemia, unspecified: Secondary | ICD-10-CM | POA: Diagnosis not present

## 2019-01-15 DIAGNOSIS — Z01818 Encounter for other preprocedural examination: Secondary | ICD-10-CM

## 2019-01-15 DIAGNOSIS — J9601 Acute respiratory failure with hypoxia: Secondary | ICD-10-CM | POA: Diagnosis not present

## 2019-01-15 DIAGNOSIS — M84459A Pathological fracture, hip, unspecified, initial encounter for fracture: Secondary | ICD-10-CM | POA: Diagnosis present

## 2019-01-15 DIAGNOSIS — R079 Chest pain, unspecified: Secondary | ICD-10-CM | POA: Diagnosis not present

## 2019-01-15 DIAGNOSIS — K810 Acute cholecystitis: Secondary | ICD-10-CM | POA: Diagnosis not present

## 2019-01-15 DIAGNOSIS — R7989 Other specified abnormal findings of blood chemistry: Secondary | ICD-10-CM | POA: Diagnosis not present

## 2019-01-15 DIAGNOSIS — R627 Adult failure to thrive: Secondary | ICD-10-CM

## 2019-01-15 DIAGNOSIS — Z96651 Presence of right artificial knee joint: Secondary | ICD-10-CM | POA: Diagnosis present

## 2019-01-15 DIAGNOSIS — Z6828 Body mass index (BMI) 28.0-28.9, adult: Secondary | ICD-10-CM

## 2019-01-15 DIAGNOSIS — I6389 Other cerebral infarction: Secondary | ICD-10-CM | POA: Diagnosis not present

## 2019-01-15 DIAGNOSIS — K661 Hemoperitoneum: Secondary | ICD-10-CM | POA: Diagnosis not present

## 2019-01-15 DIAGNOSIS — D72819 Decreased white blood cell count, unspecified: Secondary | ICD-10-CM | POA: Diagnosis not present

## 2019-01-15 DIAGNOSIS — I959 Hypotension, unspecified: Secondary | ICD-10-CM | POA: Diagnosis not present

## 2019-01-15 DIAGNOSIS — R570 Cardiogenic shock: Secondary | ICD-10-CM | POA: Diagnosis not present

## 2019-01-15 DIAGNOSIS — R05 Cough: Secondary | ICD-10-CM | POA: Diagnosis not present

## 2019-01-15 DIAGNOSIS — R197 Diarrhea, unspecified: Secondary | ICD-10-CM | POA: Diagnosis not present

## 2019-01-15 DIAGNOSIS — F05 Delirium due to known physiological condition: Secondary | ICD-10-CM | POA: Diagnosis not present

## 2019-01-15 DIAGNOSIS — I214 Non-ST elevation (NSTEMI) myocardial infarction: Secondary | ICD-10-CM

## 2019-01-15 DIAGNOSIS — K859 Acute pancreatitis without necrosis or infection, unspecified: Secondary | ICD-10-CM | POA: Diagnosis not present

## 2019-01-15 DIAGNOSIS — I6322 Cerebral infarction due to unspecified occlusion or stenosis of basilar arteries: Secondary | ICD-10-CM | POA: Diagnosis not present

## 2019-01-15 DIAGNOSIS — J811 Chronic pulmonary edema: Secondary | ICD-10-CM | POA: Diagnosis not present

## 2019-01-15 DIAGNOSIS — J810 Acute pulmonary edema: Secondary | ICD-10-CM | POA: Diagnosis not present

## 2019-01-15 DIAGNOSIS — N179 Acute kidney failure, unspecified: Secondary | ICD-10-CM

## 2019-01-15 DIAGNOSIS — R29718 NIHSS score 18: Secondary | ICD-10-CM | POA: Diagnosis not present

## 2019-01-15 DIAGNOSIS — R579 Shock, unspecified: Secondary | ICD-10-CM | POA: Diagnosis not present

## 2019-01-15 DIAGNOSIS — R451 Restlessness and agitation: Secondary | ICD-10-CM | POA: Diagnosis not present

## 2019-01-15 DIAGNOSIS — R4189 Other symptoms and signs involving cognitive functions and awareness: Secondary | ICD-10-CM | POA: Diagnosis not present

## 2019-01-15 DIAGNOSIS — R402114 Coma scale, eyes open, never, 24 hours or more after hospital admission: Secondary | ICD-10-CM | POA: Diagnosis not present

## 2019-01-15 DIAGNOSIS — Z66 Do not resuscitate: Secondary | ICD-10-CM | POA: Diagnosis not present

## 2019-01-15 DIAGNOSIS — R6521 Severe sepsis with septic shock: Secondary | ICD-10-CM | POA: Diagnosis present

## 2019-01-15 DIAGNOSIS — Z7982 Long term (current) use of aspirin: Secondary | ICD-10-CM

## 2019-01-15 DIAGNOSIS — T380X5A Adverse effect of glucocorticoids and synthetic analogues, initial encounter: Secondary | ICD-10-CM | POA: Diagnosis not present

## 2019-01-15 DIAGNOSIS — R471 Dysarthria and anarthria: Secondary | ICD-10-CM | POA: Diagnosis not present

## 2019-01-15 DIAGNOSIS — R402214 Coma scale, best verbal response, none, 24 hours or more after hospital admission: Secondary | ICD-10-CM | POA: Diagnosis not present

## 2019-01-15 DIAGNOSIS — R062 Wheezing: Secondary | ICD-10-CM

## 2019-01-15 DIAGNOSIS — R6520 Severe sepsis without septic shock: Secondary | ICD-10-CM

## 2019-01-15 DIAGNOSIS — I501 Left ventricular failure: Secondary | ICD-10-CM | POA: Diagnosis not present

## 2019-01-15 DIAGNOSIS — R001 Bradycardia, unspecified: Secondary | ICD-10-CM | POA: Diagnosis not present

## 2019-01-15 DIAGNOSIS — E870 Hyperosmolality and hypernatremia: Secondary | ICD-10-CM | POA: Diagnosis not present

## 2019-01-15 DIAGNOSIS — D62 Acute posthemorrhagic anemia: Secondary | ICD-10-CM | POA: Diagnosis not present

## 2019-01-15 DIAGNOSIS — I63319 Cerebral infarction due to thrombosis of unspecified middle cerebral artery: Secondary | ICD-10-CM

## 2019-01-15 DIAGNOSIS — R918 Other nonspecific abnormal finding of lung field: Secondary | ICD-10-CM | POA: Diagnosis not present

## 2019-01-15 DIAGNOSIS — I429 Cardiomyopathy, unspecified: Secondary | ICD-10-CM | POA: Diagnosis present

## 2019-01-15 DIAGNOSIS — Z7189 Other specified counseling: Secondary | ICD-10-CM | POA: Diagnosis not present

## 2019-01-15 DIAGNOSIS — K828 Other specified diseases of gallbladder: Secondary | ICD-10-CM | POA: Diagnosis not present

## 2019-01-15 DIAGNOSIS — Z7952 Long term (current) use of systemic steroids: Secondary | ICD-10-CM

## 2019-01-15 DIAGNOSIS — R0689 Other abnormalities of breathing: Secondary | ICD-10-CM | POA: Diagnosis not present

## 2019-01-15 DIAGNOSIS — I9581 Postprocedural hypotension: Secondary | ICD-10-CM | POA: Diagnosis not present

## 2019-01-15 DIAGNOSIS — I63411 Cerebral infarction due to embolism of right middle cerebral artery: Secondary | ICD-10-CM | POA: Diagnosis not present

## 2019-01-15 DIAGNOSIS — Z515 Encounter for palliative care: Secondary | ICD-10-CM | POA: Diagnosis not present

## 2019-01-15 DIAGNOSIS — R0902 Hypoxemia: Secondary | ICD-10-CM | POA: Diagnosis not present

## 2019-01-15 DIAGNOSIS — N200 Calculus of kidney: Secondary | ICD-10-CM | POA: Diagnosis present

## 2019-01-15 DIAGNOSIS — Z85828 Personal history of other malignant neoplasm of skin: Secondary | ICD-10-CM

## 2019-01-15 DIAGNOSIS — I110 Hypertensive heart disease with heart failure: Secondary | ICD-10-CM | POA: Diagnosis present

## 2019-01-15 DIAGNOSIS — Z79899 Other long term (current) drug therapy: Secondary | ICD-10-CM

## 2019-01-15 DIAGNOSIS — Z452 Encounter for adjustment and management of vascular access device: Secondary | ICD-10-CM | POA: Diagnosis not present

## 2019-01-15 DIAGNOSIS — Z96641 Presence of right artificial hip joint: Secondary | ICD-10-CM | POA: Diagnosis present

## 2019-01-15 DIAGNOSIS — R069 Unspecified abnormalities of breathing: Secondary | ICD-10-CM

## 2019-01-15 DIAGNOSIS — E876 Hypokalemia: Secondary | ICD-10-CM | POA: Diagnosis not present

## 2019-01-15 DIAGNOSIS — Z781 Physical restraint status: Secondary | ICD-10-CM

## 2019-01-15 DIAGNOSIS — I6502 Occlusion and stenosis of left vertebral artery: Secondary | ICD-10-CM | POA: Diagnosis present

## 2019-01-15 DIAGNOSIS — Z8249 Family history of ischemic heart disease and other diseases of the circulatory system: Secondary | ICD-10-CM

## 2019-01-15 DIAGNOSIS — I2510 Atherosclerotic heart disease of native coronary artery without angina pectoris: Secondary | ICD-10-CM | POA: Diagnosis present

## 2019-01-15 DIAGNOSIS — Z9911 Dependence on respirator [ventilator] status: Secondary | ICD-10-CM | POA: Diagnosis not present

## 2019-01-15 DIAGNOSIS — I6523 Occlusion and stenosis of bilateral carotid arteries: Secondary | ICD-10-CM | POA: Diagnosis not present

## 2019-01-15 DIAGNOSIS — E119 Type 2 diabetes mellitus without complications: Secondary | ICD-10-CM | POA: Diagnosis present

## 2019-01-15 DIAGNOSIS — L270 Generalized skin eruption due to drugs and medicaments taken internally: Secondary | ICD-10-CM | POA: Diagnosis not present

## 2019-01-15 LAB — URINALYSIS, ROUTINE W REFLEX MICROSCOPIC
Bacteria, UA: NONE SEEN
Bilirubin Urine: NEGATIVE
Glucose, UA: NEGATIVE mg/dL
Hgb urine dipstick: NEGATIVE
Ketones, ur: NEGATIVE mg/dL
Leukocytes,Ua: NEGATIVE
Nitrite: NEGATIVE
Protein, ur: 30 mg/dL — AB
Specific Gravity, Urine: 1.016 (ref 1.005–1.030)
pH: 5 (ref 5.0–8.0)

## 2019-01-15 LAB — PROTIME-INR
INR: 1.2 (ref 0.8–1.2)
Prothrombin Time: 14.8 seconds (ref 11.4–15.2)

## 2019-01-15 LAB — CBC WITH DIFFERENTIAL/PLATELET
Abs Immature Granulocytes: 0.65 10*3/uL — ABNORMAL HIGH (ref 0.00–0.07)
Basophils Absolute: 0.1 10*3/uL (ref 0.0–0.1)
Basophils Relative: 0 %
Eosinophils Absolute: 0.3 10*3/uL (ref 0.0–0.5)
Eosinophils Relative: 1 %
HCT: 39.8 % (ref 39.0–52.0)
Hemoglobin: 12.7 g/dL — ABNORMAL LOW (ref 13.0–17.0)
Immature Granulocytes: 2 %
Lymphocytes Relative: 2 %
Lymphs Abs: 0.7 10*3/uL (ref 0.7–4.0)
MCH: 29.5 pg (ref 26.0–34.0)
MCHC: 31.9 g/dL (ref 30.0–36.0)
MCV: 92.3 fL (ref 80.0–100.0)
Monocytes Absolute: 0.9 10*3/uL (ref 0.1–1.0)
Monocytes Relative: 2 %
Neutro Abs: 35.2 10*3/uL — ABNORMAL HIGH (ref 1.7–7.7)
Neutrophils Relative %: 93 %
Platelets: 210 10*3/uL (ref 150–400)
RBC: 4.31 MIL/uL (ref 4.22–5.81)
RDW: 13.5 % (ref 11.5–15.5)
WBC: 37.8 10*3/uL — ABNORMAL HIGH (ref 4.0–10.5)
nRBC: 0 % (ref 0.0–0.2)

## 2019-01-15 LAB — COMPREHENSIVE METABOLIC PANEL
ALT: 17 U/L (ref 0–44)
AST: 52 U/L — ABNORMAL HIGH (ref 15–41)
Albumin: 3.4 g/dL — ABNORMAL LOW (ref 3.5–5.0)
Alkaline Phosphatase: 61 U/L (ref 38–126)
Anion gap: 13 (ref 5–15)
BUN: 25 mg/dL — ABNORMAL HIGH (ref 8–23)
CO2: 22 mmol/L (ref 22–32)
Calcium: 9.1 mg/dL (ref 8.9–10.3)
Chloride: 105 mmol/L (ref 98–111)
Creatinine, Ser: 1.65 mg/dL — ABNORMAL HIGH (ref 0.61–1.24)
GFR calc Af Amer: 44 mL/min — ABNORMAL LOW (ref >60)
GFR calc non Af Amer: 38 mL/min — ABNORMAL LOW (ref >60)
Glucose, Bld: 92 mg/dL (ref 70–99)
Potassium: 3.8 mmol/L (ref 3.5–5.1)
Sodium: 140 mmol/L (ref 135–145)
Total Bilirubin: 0.9 mg/dL (ref 0.3–1.2)
Total Protein: 6.2 g/dL — ABNORMAL LOW (ref 6.5–8.1)

## 2019-01-15 LAB — LACTIC ACID, PLASMA
Lactic Acid, Venous: 3.7 mmol/L (ref 0.5–1.9)
Lactic Acid, Venous: 2.9 mmol/L (ref 0.5–1.9)

## 2019-01-15 LAB — SARS CORONAVIRUS 2 BY RT PCR (HOSPITAL ORDER, PERFORMED IN ~~LOC~~ HOSPITAL LAB): SARS Coronavirus 2: NEGATIVE

## 2019-01-15 LAB — POC SARS CORONAVIRUS 2 AG -  ED: SARS Coronavirus 2 Ag: NEGATIVE

## 2019-01-15 LAB — APTT: aPTT: 29 seconds (ref 24–36)

## 2019-01-15 MED ORDER — ALBUTEROL SULFATE (2.5 MG/3ML) 0.083% IN NEBU: 2.5000 mg | INHALATION_SOLUTION | RESPIRATORY_TRACT | Status: DC | PRN

## 2019-01-15 MED ORDER — ONDANSETRON HCL 4 MG/2ML IJ SOLN
4.0000 mg | Freq: Four times a day (QID) | INTRAMUSCULAR | Status: DC | PRN
  Administered 2019-01-15 – 2019-01-17 (×2): 4 mg via INTRAVENOUS
  Filled 2019-01-15 (×2): qty 2

## 2019-01-15 MED ORDER — ONDANSETRON HCL 4 MG PO TABS: 4.0000 mg | ORAL_TABLET | Freq: Four times a day (QID) | ORAL | Status: DC | PRN

## 2019-01-15 MED ORDER — POLYETHYLENE GLYCOL 3350 17 G PO PACK: 17.0000 g | PACK | Freq: Every day | ORAL | Status: DC | PRN

## 2019-01-15 MED ORDER — SODIUM CHLORIDE 0.9 % IV BOLUS
1000.0000 mL | Freq: Once | INTRAVENOUS | Status: AC
  Administered 2019-01-15: 1000 mL via INTRAVENOUS

## 2019-01-15 MED ORDER — IOHEXOL 300 MG/ML  SOLN
75.0000 mL | Freq: Once | INTRAMUSCULAR | Status: AC | PRN
  Administered 2019-01-15: 75 mL via INTRAVENOUS

## 2019-01-15 MED ORDER — NOREPINEPHRINE 4 MG/250ML-% IV SOLN
0.0000 ug/min | INTRAVENOUS | Status: DC
  Administered 2019-01-15: 18 ug/min via INTRAVENOUS
  Administered 2019-01-15: 2 ug/min via INTRAVENOUS
  Administered 2019-01-15: 18 ug/min via INTRAVENOUS
  Administered 2019-01-16: 19 ug/min via INTRAVENOUS
  Administered 2019-01-16: 16 ug/min via INTRAVENOUS
  Filled 2019-01-15 (×6): qty 250

## 2019-01-15 MED ORDER — ACETAMINOPHEN 325 MG PO TABS: 650.0000 mg | ORAL_TABLET | Freq: Four times a day (QID) | ORAL | Status: DC | PRN

## 2019-01-15 MED ORDER — VANCOMYCIN HCL IN DEXTROSE 1-5 GM/200ML-% IV SOLN
1000.0000 mg | INTRAVENOUS | Status: DC
  Administered 2019-01-16: 1000 mg via INTRAVENOUS
  Filled 2019-01-15 (×2): qty 200

## 2019-01-15 MED ORDER — IPRATROPIUM BROMIDE 0.02 % IN SOLN
RESPIRATORY_TRACT | Status: AC
  Administered 2019-01-15: 0.5 mg
  Filled 2019-01-15: qty 2.5

## 2019-01-15 MED ORDER — SODIUM CHLORIDE 0.9 % IV SOLN
INTRAVENOUS | Status: DC
  Administered 2019-01-15 – 2019-01-16 (×2): via INTRAVENOUS

## 2019-01-15 MED ORDER — PYRIDOSTIGMINE BROMIDE 60 MG PO TABS
60.0000 mg | ORAL_TABLET | Freq: Three times a day (TID) | ORAL | Status: DC
  Administered 2019-01-15 – 2019-01-16 (×3): 60 mg via ORAL
  Filled 2019-01-15 (×8): qty 1

## 2019-01-15 MED ORDER — HYDROCORTISONE NA SUCCINATE PF 100 MG IJ SOLR
50.0000 mg | Freq: Three times a day (TID) | INTRAMUSCULAR | Status: AC
  Administered 2019-01-16 – 2019-01-17 (×5): 50 mg via INTRAVENOUS
  Filled 2019-01-15 (×5): qty 2

## 2019-01-15 MED ORDER — ACETAMINOPHEN 650 MG RE SUPP: 650.0000 mg | Freq: Four times a day (QID) | RECTAL | Status: DC | PRN

## 2019-01-15 MED ORDER — LEVALBUTEROL HCL 0.63 MG/3ML IN NEBU
INHALATION_SOLUTION | RESPIRATORY_TRACT | Status: AC
  Administered 2019-01-15: 0.63 mg
  Filled 2019-01-15: qty 3

## 2019-01-15 MED ORDER — ASPIRIN EC 81 MG PO TBEC
81.0000 mg | DELAYED_RELEASE_TABLET | Freq: Every day | ORAL | Status: DC
  Administered 2019-01-16: 81 mg via ORAL
  Filled 2019-01-15 (×2): qty 1

## 2019-01-15 MED ORDER — SENNOSIDES-DOCUSATE SODIUM 8.6-50 MG PO TABS
2.0000 | ORAL_TABLET | Freq: Two times a day (BID) | ORAL | Status: DC
  Administered 2019-01-16: 2 via ORAL
  Filled 2019-01-15 (×3): qty 2

## 2019-01-15 MED ORDER — VANCOMYCIN HCL IN DEXTROSE 1-5 GM/200ML-% IV SOLN
1000.0000 mg | Freq: Once | INTRAVENOUS | Status: AC
  Administered 2019-01-15: 1000 mg via INTRAVENOUS
  Filled 2019-01-15: qty 200

## 2019-01-15 MED ORDER — SODIUM CHLORIDE 0.9% FLUSH
3.0000 mL | Freq: Two times a day (BID) | INTRAVENOUS | Status: DC
  Administered 2019-01-15 – 2019-01-28 (×17): 3 mL via INTRAVENOUS

## 2019-01-15 MED ORDER — SODIUM CHLORIDE 0.9 % IV SOLN
250.0000 mL | INTRAVENOUS | Status: DC | PRN
  Administered 2019-01-18 – 2019-01-23 (×2): 250 mL via INTRAVENOUS
  Administered 2019-01-24 – 2019-01-25 (×2): via INTRAVENOUS
  Administered 2019-01-26 – 2019-01-28 (×3): 250 mL via INTRAVENOUS

## 2019-01-15 MED ORDER — TRAZODONE HCL 50 MG PO TABS
50.0000 mg | ORAL_TABLET | Freq: Every evening | ORAL | Status: DC | PRN
  Administered 2019-01-20 – 2019-01-26 (×4): 50 mg via ORAL
  Filled 2019-01-15 (×6): qty 1

## 2019-01-15 MED ORDER — HEPARIN SODIUM (PORCINE) 5000 UNIT/ML IJ SOLN
5000.0000 [IU] | Freq: Three times a day (TID) | INTRAMUSCULAR | Status: DC
  Administered 2019-01-15: 5000 [IU] via SUBCUTANEOUS
  Filled 2019-01-15: qty 1

## 2019-01-15 MED ORDER — SODIUM CHLORIDE 0.9% FLUSH: 3.0000 mL | INTRAVENOUS | Status: DC | PRN

## 2019-01-15 MED ORDER — HYDROCORTISONE NA SUCCINATE PF 100 MG IJ SOLR
100.0000 mg | Freq: Once | INTRAMUSCULAR | Status: AC
  Administered 2019-01-15: 100 mg via INTRAVENOUS
  Filled 2019-01-15: qty 2

## 2019-01-15 MED ORDER — SODIUM CHLORIDE 0.9 % IV SOLN
1.0000 g | Freq: Two times a day (BID) | INTRAVENOUS | Status: DC
  Administered 2019-01-15 – 2019-01-21 (×12): 1 g via INTRAVENOUS
  Filled 2019-01-15 (×13): qty 1

## 2019-01-15 MED ORDER — ADULT MULTIVITAMIN W/MINERALS CH
1.0000 | ORAL_TABLET | Freq: Every day | ORAL | Status: DC
  Filled 2019-01-15: qty 1

## 2019-01-15 MED ORDER — VITAMIN D 25 MCG (1000 UNIT) PO TABS
1000.0000 [IU] | ORAL_TABLET | Freq: Every day | ORAL | Status: DC
  Administered 2019-01-18 – 2019-01-24 (×6): 1000 [IU] via ORAL
  Filled 2019-01-15 (×8): qty 1

## 2019-01-15 NOTE — ED Notes (Signed)
Daughter called to get update on the Pt and to leave her contact # if there were questions.  Wants updated calls please. [place]

## 2019-01-15 NOTE — ED Triage Notes (Signed)
Patient was in [place] from October 18-29. He experienced sudden onset weakness last night. EMS called a code sepsis in route. Patient was given 1 g of rocephin and 500 bolus in route.

## 2019-01-15 NOTE — ED Notes (Signed)
Date and time results received: 01/18/2019 12:36 PM    Test: Lactic acid Critical Value: 3.7   Name of Provider Notified: Evalee Jefferson Orders Received? Or Actions Taken?:see orders

## 2019-01-15 NOTE — H&P (Signed)
Patient Demographics:    Duane Burns, is a 83 y.o. male  MRN: [id number]   DOB - May 20, 1935  Admit Date - 01/20/2019  Outpatient Primary MD for the patient is [name], DO   Assessment & Plan:    Principal Problem:   Sepsis [facility]) Active Problems:   Essential hypertension   Myasthenia gravis ([place])   1)Sepsis with septic shock of unknown Etiology--patient with recent E. coli bacteremia at [facility]--- -blood cultures from 12/14/2018 at [facility] with positive for E. coli, repeat blood cultures 12/20/2018 was negative -Patient was treated with Rocephin -Treat empirically with meropenem for possible ESBL organism -Persistently low BP in the setting of sepsis and concerns about septic shock -Patient met sepsis criteria with fever, tachypnea, tachycardia, leukocytosis and persistent hypotension on presentation -CT chest abdomen and pelvis shows pericholecystic edema and left renal stones  lactic acid is 3.7, WBC 37.8 however patient is on chronic prednisone therapy -IV fluid boluses per sepsis protocol given, repeat lactic acid pending -Blood cultures pending  -start stress dose IV Solu-Cortef due to persistent hypotension and the patient was chronically on steroids -IV Levophed to keep MAP close to 60 -Add IV vancomycin pending MRSA PCR  2)Possible Cholecystitis--- CT abdomen suggest possible cholecystitis, recent work-up at [facility] in October 2020 revealed possible chronic cholecystitis pain HIDA scan with morphine (cystic duct was patent and there was no evidence of acute cholecystitis) -AST 52, ALT 70 9T bili 0.9   3)HTN-- Hold Lisinopril due to AKI and hypotension  4)History of moderate to severe aortic stenosis----presented with hypotension most likely secondary to  sepsis rather than AS, repeat echo pending  5)Social/Ethics--plan of care and advanced directives discussed with patient and his wife, wife is at bedside, patient is a full code Unable to reach daughter at [phone]  6) myasthenia gravis--continue Mestinon, hold prednisone, start stress dose IV Solu-Cortef due to persistent hypotension and the patient was chronically on steroids  7)AKI----acute kidney injury secondary to sepsis with hypotension   creatinine on admission= 1.65,   baseline creatinine = 1.0   , renally adjust medications, avoid nephrotoxic agents/dehydration/hypotension -Patient had recent AKI as well with his creatinine was as high as 4.0 on 12/14/2018 during admission to [place] lisinopril  8) subacute/chronic left hip fracture--- evaluated by orthopedic surgeon at [facility] patient told no operative treatment needed at this time  HLD--- Hold Pravastatin  With History of - Reviewed by me  Past Medical History:  Diagnosis Date  . Arthritis   . Cancer [facility])    History of skin cancer / hx Prostate cancer  . Dysphagia 11/10/2014  . Dysphonia 11/11/2014  . Foot drop, left    DUE TO NERVE INJURY IN BACK PER PT  . HNP (herniated nucleus pulposus with myelopathy), thoracic    HISTORY OF HNP  . Hyperlipidemia   . Hypertension   . Myasthenia gravis ([place]) 11/12/2014   versus Guillain-Barr  syndrome.  . Pneumonia    JAN 2016  . Urgency of urination   . Urinary leakage       Past Surgical History:  Procedure Laterality Date  . BACK SURGERY     X 3  . CYSTOSCOPY WITH URETHRAL DILATATION N/A 05/10/2014   Procedure: CYSTOSCOPY WITH URETHRAL DILATATION;  Surgeon: Alexis Frock, MD;  Location: [facility]S;  Service: Urology;  Laterality: N/A;  with catheter placement, prior to surgery. procedure performed on the stretcher.  Marland Kitchen PROSTATE SURGERY    . SKIN CANCER EXCISION    . TONSILLECTOMY    . TOTAL HIP ARTHROPLASTY Right 05/10/2014   Procedure: RIGHT  TOTAL HIP ARTHROPLASTY ANTERIOR APPROACH ;  Surgeon: Paralee Cancel, MD;  Location: [facility];  Service: Orthopedics;  Laterality: Right;    Chief Complaint  Patient presents with  . Code Sepsis      HPI:    Duane Burns  is a 83 y.o. male with past medical history relevant for myasthenia gravis on chronic Mestinon and prednisone, HTN, HLD, and prior history of prostate cancer presented to ED on 01/22/2019 with hypotension and high fevers-EMS gave patient Rocephin and IV fluid bolus due to concerns about sepsis -Patient's wife reports that last evening patient started to feel very weak, he was dizzy and had ambulatory dysfunction  In ED--temp 100.7, heart rate 106, respiratory rate 36, BP 71/59 -BP remained low despite IV fluid boluses -EDP placed femoral line and start the patient on IV Levophed for pressure support -Please note that patient was recently admitted at [facility] from 12/14/2018 through 12/24/2018 with E. coli bacteremia and sepsis from unknown source -Patient was initially seen on 12/13/2018 at [facility] culture was negative patient was transferred to [place] on 12/14/2018  --Additional history obtained from patient's wife at bedside In ED--COVID-19 test is negative ,  -CT chest abdomen and pelvis shows pericholecystic edema and left renal stones In ED--creatinine is 1.65, lactic acid is 3.7, WBC 37.8 however patient is on chronic prednisone therapy -AST is 52 ALT 17 with T bili of 0.9   Review of systems:    In addition to the HPI above,   A full Review of  Systems was done, all other systems reviewed are negative except as noted above in HPI , .    Social History:  Reviewed by me    Social History   Tobacco Use  . Smoking status: Former Smoker    Quit date: 05/05/1994    Years since quitting: 24.7  . Smokeless tobacco: Never Used  Substance Use Topics  . Alcohol use: No     Family History :  Reviewed by me    Family History  Problem  Relation Age of Onset  . Diabetes Other   . Hypertension Other      Home Medications:   Prior to Admission medications   Medication Sig Start Date End Date Taking? Authorizing Provider  amoxicillin-clavulanate (AUGMENTIN) 875-125 MG tablet Take 1 tablet by mouth 2 (two) times daily. 12/24/18   [provider]  aspirin EC 81 MG tablet Take 81 mg by mouth daily.    [provider]  cholecalciferol (VITAMIN D) 1000 UNITS tablet Take 1,000 Units by mouth daily.    [provider]  lisinopril (ZESTRIL) 5 MG tablet TAKE 1 TABLET BY MOUTH EVERY DAY 11/12/18   Ronnie Doss M, DO  Multiple Vitamin (MULTIVITAMIN WITH MINERALS) TABS tablet Take 1 tablet by mouth daily.    [provider]  Omega-3 Fatty Acids (FISH OIL) 1200 MG CAPS Take 1-2 capsules (1,200-2,400 mg total) by mouth 2 (two) times daily. RESTART IN 1 WEEK IF YOUR SWALLOWING CONTINUES TO IMPROVE. Take 2 capsules in the morning and 1 capsule in the evening 11/16/14   Rexene Alberts, MD  pravastatin (PRAVACHOL) 80 MG tablet Take 1 tablet (80 mg total) by mouth every evening. 12/28/18   [name], DO  predniSONE (DELTASONE) 10 MG tablet Take 10 mg by mouth daily. 04/07/17   [provider]  pyridostigmine (MESTINON) 60 MG tablet Take 60 mg by mouth 3 (three) times daily.     [provider]     Allergies:    No Known Allergies   Physical Exam:   Vitals  Blood pressure 127/86, pulse 91, temperature 98.3 F (36.8 C), temperature source Oral, resp. rate (!) 23, height '5\' 8"'$  (1.727 m), weight 87.1 kg, SpO2 94 %.  Physical Examination: General appearance - alert, ill-appearing,  mental status - alert, oriented to person, place, and time,  Eyes - sclera anicteric Neck - supple, no JVD elevation , Chest - clear  to auscultation bilaterally, symmetrical air movement,  Heart - S1 and S2 normal, regular  Abdomen - soft, , nondistended, no significant abdominal tenderness at  this time  neurological - screening mental status exam normal, neck supple without rigidity, cranial nerves II through XII intact, DTR's normal and symmetric Extremities - no pedal edema noted, intact peripheral pulses Skin - warm, dry     Data Review:    CBC Recent Labs  Lab 01/14/2019 1207  WBC 37.8*  HGB 12.7*  HCT 39.8  PLT 210  MCV 92.3  MCH 29.5  MCHC 31.9  RDW 13.5  LYMPHSABS 0.7  MONOABS 0.9  EOSABS 0.3  BASOSABS 0.1   ------------------------------------------------------------------------------------------------------------------  Chemistries  Recent Labs  Lab 01/07/2019 1207  NA 140  K 3.8  CL 105  CO2 22  GLUCOSE 92  BUN 25*  CREATININE 1.65*  CALCIUM 9.1  AST 52*  ALT 17  ALKPHOS 61  BILITOT 0.9   ------------------------------------------------------------------------------------------------------------------ estimated creatinine clearance is 37.7 mL/min (A) (by C-G formula based on SCr of 1.65 mg/dL (H)). ------------------------------------------------------------------------------------------------------------------ No results for input(s): TSH, T4TOTAL, T3FREE, THYROIDAB in the last 72 hours.  Invalid input(s): FREET3   Coagulation profile Recent Labs  Lab 01/09/2019 1207  INR 1.2   ------------------------------------------------------------------------------------------------------------------- No results for input(s): DDIMER in the last 72 hours. -------------------------------------------------------------------------------------------------------------------  Cardiac Enzymes No results for input(s): CKMB, TROPONINI, MYOGLOBIN in the last 168 hours.  Invalid input(s): CK ------------------------------------------------------------------------------------------------------------------ No results found for: BNP   ---------------------------------------------------------------------------------------------------------------   Urinalysis    Component Value Date/Time   COLORURINE YELLOW 12/28/2018 1147   APPEARANCEUR HAZY (A) 01/03/2019 1147   LABSPEC 1.016 01/10/2019 1147   PHURINE 5.0 01/03/2019 1147   GLUCOSEU NEGATIVE 12/31/2018 1147   [place] 01/08/2019 1147   [place] 01/05/2019 1147   [place] 01/17/2019 1147   PROTEINUR 30 (A) 01/14/2019 1147   UROBILINOGEN 0.2 11/10/2014 1742   NITRITE NEGATIVE 01/21/2019 1147   LEUKOCYTESUR NEGATIVE 01/13/2019 1147    ----------------------------------------------------------------------------------------------------------------   Imaging Results:    Dg Abdomen 1 View  Result Date: 01/01/2019 CLINICAL DATA:  Central line placement. EXAM: ABDOMEN - 1 VIEW COMPARISON:  None. FINDINGS: Right femoral vein catheter has been inserted. The tip is in the region of the right iliac vein at the level of the S1 segment of the sacrum. The visualized bowel gas  pattern is normal. No acute bone abnormality. IMPRESSION: 1. Central line tip is in the right iliac vein at the level of the S1 segment of the sacrum. 2. Benign-appearing abdomen. Electronically Signed   By: Lorriane Shire M.D.   On: 01/08/2019 15:02   Ct Chest W Contrast  Result Date: 01/01/2019 CLINICAL DATA:  Fever of unknown origin, sepsis, leukocytosis. EXAM: CT CHEST, ABDOMEN, AND PELVIS WITH CONTRAST TECHNIQUE: Multidetector CT imaging of the chest, abdomen and pelvis was performed following the standard protocol during bolus administration of intravenous contrast. CONTRAST:  59m OMNIPAQUE IOHEXOL 300 MG/ML  SOLN COMPARISON:  CT abdomen pelvis 12/14/2018 and CT chest 07/22/2018. FINDINGS: CT CHEST FINDINGS Cardiovascular: Atherosclerotic calcification of the aorta, aortic valve and coronary arteries. Heart is enlarged. No pericardial effusion. Mediastinum/Nodes: 1.6 cm low-attenuation left thyroid nodule is unchanged. No pathologically enlarged mediastinal, hilar or axillary lymph nodes.  Esophagus is unremarkable. Lungs/Pleura: Fairly diffuse septal thickening. Mild dependent atelectasis bilaterally. No pleural fluid. Debris is seen dependently in the lower trachea and mainstem bronchi. Musculoskeletal: Degenerative changes in the spine. No worrisome lytic or sclerotic lesions. CT ABDOMEN PELVIS FINDINGS Hepatobiliary: Hyperattenuating lesion in the dome of the liver measures 1.2 cm (2/42) and is nonspecific, possibly representing a flash fill hemangioma or perfusion anomaly. Gallbladder appears mildly distended and there is surrounding haziness and stranding, similar to 12/14/2018. No biliary ductal dilatation. Pancreas: Negative. Spleen: Negative. Adrenals/Urinary Tract: Adrenal glands and right kidney are unremarkable. Stones are seen in the left kidney. Probable left renal sinus cysts. Ureters are decompressed. Bladder is low in volume. Stomach/Bowel: Stomach, small bowel, appendix and colon are unremarkable. Vascular/Lymphatic: Atherosclerotic calcification of the aorta without aneurysm. Right-sided femoral venous line terminates near the bifurcation of the internal and external iliac veins on the right. No pathologically enlarged lymph nodes. Reproductive: Prostatectomy. Other: Small bilateral inguinal hernias contain fat. Tiny amount of fluid in the right paracolic gutter. Mesenteries and peritoneum are otherwise unremarkable. Musculoskeletal: Right hip arthroplasty. Degenerative changes in the left hip and spine. IMPRESSION: 1. Mild pericholecystic edema, similar to 12/14/2018. A common bile duct stone was questioned at that time. Consider right upper quadrant ultrasound in further evaluation for acute cholecystitis. Otherwise, no findings to explain the patient's fever and leukocytosis. 2. Pulmonary edema. 3. Left renal stones. 4. Aortic atherosclerosis (ICD10-170.0). Coronary artery calcification. Electronically Signed   By: MLorin PicketM.D.   On: 01/24/2019 16:34   Ct Abdomen Pelvis  W Contrast  Result Date: 01/20/2019 CLINICAL DATA:  Fever of unknown origin, sepsis, leukocytosis. EXAM: CT CHEST, ABDOMEN, AND PELVIS WITH CONTRAST TECHNIQUE: Multidetector CT imaging of the chest, abdomen and pelvis was performed following the standard protocol during bolus administration of intravenous contrast. CONTRAST:  760mOMNIPAQUE IOHEXOL 300 MG/ML  SOLN COMPARISON:  CT abdomen pelvis 12/14/2018 and CT chest 07/22/2018. FINDINGS: CT CHEST FINDINGS Cardiovascular: Atherosclerotic calcification of the aorta, aortic valve and coronary arteries. Heart is enlarged. No pericardial effusion. Mediastinum/Nodes: 1.6 cm low-attenuation left thyroid nodule is unchanged. No pathologically enlarged mediastinal, hilar or axillary lymph nodes. Esophagus is unremarkable. Lungs/Pleura: Fairly diffuse septal thickening. Mild dependent atelectasis bilaterally. No pleural fluid. Debris is seen dependently in the lower trachea and mainstem bronchi. Musculoskeletal: Degenerative changes in the spine. No worrisome lytic or sclerotic lesions. CT ABDOMEN PELVIS FINDINGS Hepatobiliary: Hyperattenuating lesion in the dome of the liver measures 1.2 cm (2/42) and is nonspecific, possibly representing a flash fill hemangioma or perfusion anomaly. Gallbladder appears mildly distended and there is surrounding haziness and  stranding, similar to 12/14/2018. No biliary ductal dilatation. Pancreas: Negative. Spleen: Negative. Adrenals/Urinary Tract: Adrenal glands and right kidney are unremarkable. Stones are seen in the left kidney. Probable left renal sinus cysts. Ureters are decompressed. Bladder is low in volume. Stomach/Bowel: Stomach, small bowel, appendix and colon are unremarkable. Vascular/Lymphatic: Atherosclerotic calcification of the aorta without aneurysm. Right-sided femoral venous line terminates near the bifurcation of the internal and external iliac veins on the right. No pathologically enlarged lymph nodes. Reproductive:  Prostatectomy. Other: Small bilateral inguinal hernias contain fat. Tiny amount of fluid in the right paracolic gutter. Mesenteries and peritoneum are otherwise unremarkable. Musculoskeletal: Right hip arthroplasty. Degenerative changes in the left hip and spine. IMPRESSION: 1. Mild pericholecystic edema, similar to 12/14/2018. A common bile duct stone was questioned at that time. Consider right upper quadrant ultrasound in further evaluation for acute cholecystitis. Otherwise, no findings to explain the patient's fever and leukocytosis. 2. Pulmonary edema. 3. Left renal stones. 4. Aortic atherosclerosis (ICD10-170.0). Coronary artery calcification. Electronically Signed   By: Lorin Picket M.D.   On: 01/08/2019 16:34   Dg Chest Port 1 View  Result Date: 12/31/2018 CLINICAL DATA:  Fever. Sudden onset of weakness last night. EXAM: PORTABLE CHEST 1 VIEW COMPARISON:  Radiographs 12/14/2018 and 08/04/2017.  CT 07/22/2018. FINDINGS: 1212 hours. The heart size and mediastinal contours are stable with aortic atherosclerosis. There is stable chronic elevation/eventration of the right hemidiaphragm with associated chronic right basilar atelectasis. The lungs are otherwise clear. There is no pleural effusion or pneumothorax. The bones appear unremarkable. Telemetry leads overlie the chest. IMPRESSION: Stable postoperative chest with chronic right basilar atelectasis. No acute cardiopulmonary process. Electronically Signed   By: Richardean Sale M.D.   On: 01/13/2019 12:42    Radiological Exams on Admission: Dg Abdomen 1 View  Result Date: 01/06/2019 CLINICAL DATA:  Central line placement. EXAM: ABDOMEN - 1 VIEW COMPARISON:  None. FINDINGS: Right femoral vein catheter has been inserted. The tip is in the region of the right iliac vein at the level of the S1 segment of the sacrum. The visualized bowel gas pattern is normal. No acute bone abnormality. IMPRESSION: 1. Central line tip is in the right iliac vein at the  level of the S1 segment of the sacrum. 2. Benign-appearing abdomen. Electronically Signed   By: Lorriane Shire M.D.   On: 12/29/2018 15:02   Ct Chest W Contrast  Result Date: 12/27/2018 CLINICAL DATA:  Fever of unknown origin, sepsis, leukocytosis. EXAM: CT CHEST, ABDOMEN, AND PELVIS WITH CONTRAST TECHNIQUE: Multidetector CT imaging of the chest, abdomen and pelvis was performed following the standard protocol during bolus administration of intravenous contrast. CONTRAST:  83m OMNIPAQUE IOHEXOL 300 MG/ML  SOLN COMPARISON:  CT abdomen pelvis 12/14/2018 and CT chest 07/22/2018. FINDINGS: CT CHEST FINDINGS Cardiovascular: Atherosclerotic calcification of the aorta, aortic valve and coronary arteries. Heart is enlarged. No pericardial effusion. Mediastinum/Nodes: 1.6 cm low-attenuation left thyroid nodule is unchanged. No pathologically enlarged mediastinal, hilar or axillary lymph nodes. Esophagus is unremarkable. Lungs/Pleura: Fairly diffuse septal thickening. Mild dependent atelectasis bilaterally. No pleural fluid. Debris is seen dependently in the lower trachea and mainstem bronchi. Musculoskeletal: Degenerative changes in the spine. No worrisome lytic or sclerotic lesions. CT ABDOMEN PELVIS FINDINGS Hepatobiliary: Hyperattenuating lesion in the dome of the liver measures 1.2 cm (2/42) and is nonspecific, possibly representing a flash fill hemangioma or perfusion anomaly. Gallbladder appears mildly distended and there is surrounding haziness and stranding, similar to 12/14/2018. No biliary ductal dilatation. Pancreas: Negative. Spleen:  Negative. Adrenals/Urinary Tract: Adrenal glands and right kidney are unremarkable. Stones are seen in the left kidney. Probable left renal sinus cysts. Ureters are decompressed. Bladder is low in volume. Stomach/Bowel: Stomach, small bowel, appendix and colon are unremarkable. Vascular/Lymphatic: Atherosclerotic calcification of the aorta without aneurysm. Right-sided femoral  venous line terminates near the bifurcation of the internal and external iliac veins on the right. No pathologically enlarged lymph nodes. Reproductive: Prostatectomy. Other: Small bilateral inguinal hernias contain fat. Tiny amount of fluid in the right paracolic gutter. Mesenteries and peritoneum are otherwise unremarkable. Musculoskeletal: Right hip arthroplasty. Degenerative changes in the left hip and spine. IMPRESSION: 1. Mild pericholecystic edema, similar to 12/14/2018. A common bile duct stone was questioned at that time. Consider right upper quadrant ultrasound in further evaluation for acute cholecystitis. Otherwise, no findings to explain the patient's fever and leukocytosis. 2. Pulmonary edema. 3. Left renal stones. 4. Aortic atherosclerosis (ICD10-170.0). Coronary artery calcification. Electronically Signed   By: Lorin Picket M.D.   On: 12/27/2018 16:34   Ct Abdomen Pelvis W Contrast  Result Date: 01/20/2019 CLINICAL DATA:  Fever of unknown origin, sepsis, leukocytosis. EXAM: CT CHEST, ABDOMEN, AND PELVIS WITH CONTRAST TECHNIQUE: Multidetector CT imaging of the chest, abdomen and pelvis was performed following the standard protocol during bolus administration of intravenous contrast. CONTRAST:  60m OMNIPAQUE IOHEXOL 300 MG/ML  SOLN COMPARISON:  CT abdomen pelvis 12/14/2018 and CT chest 07/22/2018. FINDINGS: CT CHEST FINDINGS Cardiovascular: Atherosclerotic calcification of the aorta, aortic valve and coronary arteries. Heart is enlarged. No pericardial effusion. Mediastinum/Nodes: 1.6 cm low-attenuation left thyroid nodule is unchanged. No pathologically enlarged mediastinal, hilar or axillary lymph nodes. Esophagus is unremarkable. Lungs/Pleura: Fairly diffuse septal thickening. Mild dependent atelectasis bilaterally. No pleural fluid. Debris is seen dependently in the lower trachea and mainstem bronchi. Musculoskeletal: Degenerative changes in the spine. No worrisome lytic or sclerotic  lesions. CT ABDOMEN PELVIS FINDINGS Hepatobiliary: Hyperattenuating lesion in the dome of the liver measures 1.2 cm (2/42) and is nonspecific, possibly representing a flash fill hemangioma or perfusion anomaly. Gallbladder appears mildly distended and there is surrounding haziness and stranding, similar to 12/14/2018. No biliary ductal dilatation. Pancreas: Negative. Spleen: Negative. Adrenals/Urinary Tract: Adrenal glands and right kidney are unremarkable. Stones are seen in the left kidney. Probable left renal sinus cysts. Ureters are decompressed. Bladder is low in volume. Stomach/Bowel: Stomach, small bowel, appendix and colon are unremarkable. Vascular/Lymphatic: Atherosclerotic calcification of the aorta without aneurysm. Right-sided femoral venous line terminates near the bifurcation of the internal and external iliac veins on the right. No pathologically enlarged lymph nodes. Reproductive: Prostatectomy. Other: Small bilateral inguinal hernias contain fat. Tiny amount of fluid in the right paracolic gutter. Mesenteries and peritoneum are otherwise unremarkable. Musculoskeletal: Right hip arthroplasty. Degenerative changes in the left hip and spine. IMPRESSION: 1. Mild pericholecystic edema, similar to 12/14/2018. A common bile duct stone was questioned at that time. Consider right upper quadrant ultrasound in further evaluation for acute cholecystitis. Otherwise, no findings to explain the patient's fever and leukocytosis. 2. Pulmonary edema. 3. Left renal stones. 4. Aortic atherosclerosis (ICD10-170.0). Coronary artery calcification. Electronically Signed   By: MLorin PicketM.D.   On: 01/04/2019 16:34   Dg Chest Port 1 View  Result Date: 01/24/2019 CLINICAL DATA:  Fever. Sudden onset of weakness last night. EXAM: PORTABLE CHEST 1 VIEW COMPARISON:  Radiographs 12/14/2018 and 08/04/2017.  CT 07/22/2018. FINDINGS: 1212 hours. The heart size and mediastinal contours are stable with aortic  atherosclerosis. There is stable chronic elevation/eventration of  the right hemidiaphragm with associated chronic right basilar atelectasis. The lungs are otherwise clear. There is no pleural effusion or pneumothorax. The bones appear unremarkable. Telemetry leads overlie the chest. IMPRESSION: Stable postoperative chest with chronic right basilar atelectasis. No acute cardiopulmonary process. Electronically Signed   By: Richardean Sale M.D.   On: 01/17/2019 12:42    DVT Prophylaxis -SCD /Heparin AM Labs Ordered, also please review Full Orders  Family Communication: Admission, patients condition and plan of care including tests being ordered have been discussed with the patient and wife who indicate understanding and agree with the plan   Code Status - Full Code  Likely DC to TBD  Condition - stable  Roxan Hockey M.D on 01/18/2019 at 5:44 PM Go to [email or web address] -  for contact info  [facility] - Office  [phone]

## 2019-01-15 NOTE — ED Notes (Signed)
Patient transported to CT 

## 2019-01-15 NOTE — Progress Notes (Addendum)
Patient given xopenex /atrovent neb treatment and started on 2lpm / . His wob running around  30-40 , he is not wheezing but has congested cough , also started on Incentive for atelectasis.  He also has PVC's on monitor. Past smoking hx long quit. WBC 37.8 No acute pulmonary on lungs. Most likely heart, Fluids positive almost 4000, kidney output low.

## 2019-01-15 NOTE — ED Provider Notes (Addendum)
[facility] EMERGENCY DEPARTMENT Provider Note   CSN: [id number] Arrival date & time: 01/24/2019  1121     History   Chief Complaint Chief Complaint  Patient presents with  . Code Sepsis    HPI Duane Burns is a 83 y.o. male with a history as outlined below, most significant for hypertension, hyperlipidemia, history of pneumonia and history of myasthenia gravis was discharged from [place] the end of October where he was treated for sepsis, but per patient and EMS a source of infection was never found.  He felt well until this evening he developed sudden onset of profound weakness.  He denies any other symptoms but was found by EMS this morning with a fever of 101, tachycardic and hypotensive.  He was given 500 cc fluid bolus and a gram of Rocephin prior to arrival.  He specifically denies headache, cough or shortness of breath, no abdominal pain, nausea, vomiting, diarrhea or dysuria.  He has had no antipyretics prior to arrival.  No known exposures to Covid.     The history is provided by the patient and the EMS personnel.    Past Medical History:  Diagnosis Date  . Arthritis   . Cancer [facility])    History of skin cancer / hx Prostate cancer  . Dysphagia 11/10/2014  . Dysphonia 11/11/2014  . Foot drop, left    DUE TO NERVE INJURY IN BACK PER PT  . HNP (herniated nucleus pulposus with myelopathy), thoracic    HISTORY OF HNP  . Hyperlipidemia   . Hypertension   . Myasthenia gravis ([place]) 11/12/2014   versus Guillain-Barr syndrome.  . Pneumonia    JAN 2016  . Urgency of urination   . Urinary leakage     Patient Active Problem List   Diagnosis Date Noted  . Sepsis ([place]) 01/18/2019  . History of prostate cancer 02/13/2017  . Prediabetes 03/22/2016  . Systolic murmur [id number]  . Myasthenia gravis ([place]) 11/12/2014  . Cervical myelopathy ([place]) 11/12/2014  . Dysphonia 11/11/2014  . Left thyroid nodule 11/11/2014  . SOB (shortness of breath)   . Dysphagia 11/10/2014   . Foot drop, left 11/10/2014  . S/P right TKA 05/10/2014  . S/P hip replacement 05/10/2014  . Varicose veins without complication [id number]  . Essential hypertension 10/05/2013  . Hyperlipidemia 10/05/2013    Past Surgical History:  Procedure Laterality Date  . BACK SURGERY     X 3  . CYSTOSCOPY WITH URETHRAL DILATATION N/A 05/10/2014   Procedure: CYSTOSCOPY WITH URETHRAL DILATATION;  Surgeon: Alexis Frock, MD;  Location: [facility] ORS;  Service: Urology;  Laterality: N/A;  with catheter placement, prior to surgery. procedure performed on the stretcher.  Marland Kitchen PROSTATE SURGERY    . SKIN CANCER EXCISION    . TONSILLECTOMY    . TOTAL HIP ARTHROPLASTY Right 05/10/2014   Procedure: RIGHT TOTAL HIP ARTHROPLASTY ANTERIOR APPROACH ;  Surgeon: Paralee Cancel, MD;  Location: [facility] ORS;  Service: Orthopedics;  Laterality: Right;        Home Medications    Prior to Admission medications   Medication Sig Start Date End Date Taking? Authorizing Provider  amoxicillin-clavulanate (AUGMENTIN) 875-125 MG tablet Take 1 tablet by mouth 2 (two) times daily. 12/24/18   [provider]  aspirin EC 81 MG tablet Take 81 mg by mouth daily.    [provider]  cholecalciferol (VITAMIN D) 1000 UNITS tablet Take 1,000 Units by mouth daily.    [provider]  lisinopril (ZESTRIL) 5  MG tablet TAKE 1 TABLET BY MOUTH EVERY DAY 11/12/18   Ronnie Doss M, DO  Multiple Vitamin (MULTIVITAMIN WITH MINERALS) TABS tablet Take 1 tablet by mouth daily.    [provider]  Omega-3 Fatty Acids (FISH OIL) 1200 MG CAPS Take 1-2 capsules (1,200-2,400 mg total) by mouth 2 (two) times daily. RESTART IN 1 WEEK IF YOUR SWALLOWING CONTINUES TO IMPROVE. Take 2 capsules in the morning and 1 capsule in the evening 11/16/14   Rexene Alberts, MD  pravastatin (PRAVACHOL) 80 MG tablet Take 1 tablet (80 mg total) by mouth every evening. 12/28/18   [name], DO  predniSONE (DELTASONE) 10 MG tablet Take 10  mg by mouth daily. 04/07/17   [provider]  pyridostigmine (MESTINON) 60 MG tablet Take 60 mg by mouth 3 (three) times daily.     [provider]    Family History Family History  Problem Relation Age of Onset  . Diabetes Other   . Hypertension Other     Social History Social History   Tobacco Use  . Smoking status: Former Smoker    Quit date: 05/05/1994    Years since quitting: 24.7  . Smokeless tobacco: Never Used  Substance Use Topics  . Alcohol use: No  . Drug use: No     Allergies   Patient has no known allergies.   Review of Systems Review of Systems  Constitutional: Positive for chills, fatigue and fever.  HENT: Negative for congestion and sore throat.   Eyes: Negative.  Negative for photophobia.  Respiratory: Negative for chest tightness and shortness of breath.   Cardiovascular: Negative for chest pain.  Gastrointestinal: Negative for abdominal pain, nausea and vomiting.  Genitourinary: Negative.   Musculoskeletal: Negative for arthralgias, joint swelling, neck pain and neck stiffness.  Skin: Negative.  Negative for rash and wound.  Neurological: Positive for weakness. Negative for dizziness, light-headedness, numbness and headaches.  Psychiatric/Behavioral: Negative.      Physical Exam Updated Vital Signs BP (!) 92/35   Pulse 99   Temp (!) 100.7 F (38.2 C) (Rectal)   Resp (!) 25   Ht 5\' 8"  (1.727 m)   Wt 87.1 kg   SpO2 95%   BMI 29.19 kg/m   Physical Exam Vitals signs and nursing note reviewed.  Constitutional:      General: He is not in acute distress.    Appearance: Normal appearance. He is well-developed.  HENT:     Head: Normocephalic and atraumatic.     Mouth/Throat:     Mouth: Mucous membranes are moist.     Pharynx: No posterior oropharyngeal erythema.  Eyes:     Conjunctiva/sclera: Conjunctivae normal.  Neck:     Musculoskeletal: Normal range of motion.  Cardiovascular:     Rate and Rhythm: Regular rhythm.  Tachycardia present.     Heart sounds: Normal heart sounds.  Pulmonary:     Effort: Pulmonary effort is normal. No respiratory distress.     Breath sounds: Normal breath sounds. No wheezing.  Abdominal:     General: Bowel sounds are normal.     Palpations: Abdomen is soft.     Tenderness: There is no abdominal tenderness. There is no guarding.  Musculoskeletal: Normal range of motion.  Skin:    General: Skin is warm and dry.     Capillary Refill: Capillary refill takes 2 to 3 seconds.  Neurological:     General: No focal deficit present.     Mental Status: He is  alert.      ED Treatments / Results  Labs (all labs ordered are listed, but only abnormal results are displayed) Labs Reviewed  LACTIC ACID, PLASMA - Abnormal; Notable for the following components:      Result Value   Lactic Acid, Venous 3.7 (*)    All other components within normal limits  COMPREHENSIVE METABOLIC PANEL - Abnormal; Notable for the following components:   BUN 25 (*)    Creatinine, Ser 1.65 (*)    Total Protein 6.2 (*)    Albumin 3.4 (*)    AST 52 (*)    GFR calc non Af Amer 38 (*)    GFR calc Af Amer 44 (*)    All other components within normal limits  CBC WITH DIFFERENTIAL/PLATELET - Abnormal; Notable for the following components:   WBC 37.8 (*)    Hemoglobin 12.7 (*)    Neutro Abs 35.2 (*)    Abs Immature Granulocytes 0.65 (*)    All other components within normal limits  URINALYSIS, ROUTINE W REFLEX MICROSCOPIC - Abnormal; Notable for the following components:   APPearance HAZY (*)    Protein, ur 30 (*)    All other components within normal limits  CULTURE, BLOOD (ROUTINE X 2)  CULTURE, BLOOD (ROUTINE X 2)  URINE CULTURE  SARS CORONAVIRUS 2 BY RT PCR (HOSPITAL ORDER, [place] [facility])  APTT  PROTIME-INR  LACTIC ACID, PLASMA  POC SARS CORONAVIRUS 2 AG -  ED    EKG None  Radiology Dg Chest Port 1 View  Result Date: 12/28/2018 CLINICAL DATA:  Fever. Sudden  onset of weakness last night. EXAM: PORTABLE CHEST 1 VIEW COMPARISON:  Radiographs 12/14/2018 and 08/04/2017.  CT 07/22/2018. FINDINGS: 1212 hours. The heart size and mediastinal contours are stable with aortic atherosclerosis. There is stable chronic elevation/eventration of the right hemidiaphragm with associated chronic right basilar atelectasis. The lungs are otherwise clear. There is no pleural effusion or pneumothorax. The bones appear unremarkable. Telemetry leads overlie the chest. IMPRESSION: Stable postoperative chest with chronic right basilar atelectasis. No acute cardiopulmonary process. Electronically Signed   By: Richardean Sale M.D.   On: 01/06/2019 12:42    Procedures Procedures (including critical care time)  Medications Ordered in ED Medications  vancomycin (VANCOCIN) IVPB 1000 mg/200 mL premix (1,000 mg Intravenous New Bag/Given 01/02/2019 1254)  norepinephrine (LEVOPHED) 4mg  in 233mL premix infusion (5 mcg/min Intravenous Rate/Dose Change 01/22/2019 1331)  sodium chloride 0.9 % bolus 1,000 mL (0 mLs Intravenous Stopped 01/22/2019 1302)  sodium chloride 0.9 % bolus 1,000 mL (1,000 mLs Intravenous New Bag/Given 01/11/2019 1205)  sodium chloride 0.9 % bolus 1,000 mL (1,000 mLs Intravenous New Bag/Given 01/17/2019 1303)     Initial Impression / Assessment and Plan / ED Course  I have reviewed the triage vital signs and the nursing notes.  Pertinent labs & imaging results that were available during my care of the patient were reviewed by me and considered in my medical decision making (see chart for details).        Pt with sepsis of unclear source with normal UA, no obvious pneumonia or sx suggesting this infection.  He has a profound leukocytosis.  Felt well until sudden onset of sx last night.  Is receiving 30 cc/kg fluids,  Rocephin prior to arrival, added vanc given no obvious source of infection.  H/o right hip arthroplasty 2016, denies pain in this hip.  Rapid covid negative,   Will send confirmatory test.  Pt  will be admitted for supportive care and further evaluation.  Call to [place] regarding Tier 2 Covid test - he will approve and order  this test.   Call placed to hospitalist for admission.  Discussed with Dr. Denton Brick who will admit pt.  Requested CT chest, abd/pelvis with no IV contrast, oral only to assess for source of infection.  Ordered.  Also requested central line placed prior to moving upstairs.   CRITICAL CARE Performed by: Evalee Jefferson Total critical care time: 40 minutes Critical care time was exclusive of separately billable procedures and treating other patients. Critical care was necessary to treat or prevent imminent or life-threatening deterioration. Critical care was time spent personally by me on the following activities: development of treatment plan with patient and/or surrogate as well as nursing, discussions with consultants, evaluation of patient's response to treatment, examination of patient, obtaining history from patient or surrogate, ordering and performing treatments and interventions, ordering and review of laboratory studies, ordering and review of radiographic studies, pulse oximetry and re-evaluation of patient's condition.   1:37 PM Attempt to contact daughter with update per family request - no answer.   Final Clinical Impressions(s) / ED Diagnoses   Final diagnoses:  Sepsis with acute renal failure, due to unspecified organism, unspecified acute renal failure type, unspecified whether septic shock present ([place])  Leukocytosis, unspecified type    ED Discharge Orders    None       Landis Martins 01/05/2019 1337    Evalee Jefferson, PA-C 01/07/2019 1438    Milton Ferguson, MD 01/18/19 [phone]

## 2019-01-15 NOTE — Progress Notes (Signed)
Pharmacy Antibiotic Note  Duane Burns is a 83 y.o. male admitted on 01/21/2019 with sepsis.  Pharmacy has been consulted for vancomycin and meropenem dosing.  Plan: Meropenem 1g IV q12h Vancomycin 1g IV q24h for estimated AUC 489 (goal 400-550) Consider vancomycin levels at steady state, sooner if renal function warrants Follow up renal function & cultures  Height: 5\' 8"  (172.7 cm) Weight: 192 lb (87.1 kg) IBW/kg (Calculated) : 68.4  Temp (24hrs), Avg:99.4 F (37.4 C), Min:98.3 F (36.8 C), Max:100.7 F (38.2 C)  Recent Labs  Lab 01/16/2019 1207  WBC 37.8*  CREATININE 1.65*  LATICACIDVEN 3.7*    Estimated Creatinine Clearance: 37.7 mL/min (A) (by C-G formula based on SCr of 1.65 mg/dL (H)).    No Known Allergies  Antimicrobials this admission:  11/20 Vanc >> 11/20 Meropenem >>  Dose adjustments this admission:   Microbiology results:  11/20 COVID: neg 11/20 BCx: 11/20 UCx: 11/20 MRSA PCR:  Thank you for allowing pharmacy to be a part of this patient's care.  Peggyann Juba, PharmD, BCPS 01/12/2019 6:03 PM

## 2019-01-15 NOTE — Progress Notes (Signed)
Pt reporting no pain, reports no problem breathing. Occasional increased RR; has developed an occasional dry cough; frequent PVC's and levo is at 19 mcg. Placed 2L O2 on patient. Will continue to assess

## 2019-01-15 NOTE — Progress Notes (Signed)
*  PRELIMINARY RESULTS* Echocardiogram 2D Echocardiogram has been performed.  Samuel Germany 01/08/2019, 5:30 PM

## 2019-01-16 ENCOUNTER — Inpatient Hospital Stay (HOSPITAL_COMMUNITY): Payer: Medicare HMO

## 2019-01-16 ENCOUNTER — Inpatient Hospital Stay: Admission: AD | Admit: 2019-01-16 | Payer: Medicare HMO | Admitting: Pulmonary Disease

## 2019-01-16 DIAGNOSIS — R079 Chest pain, unspecified: Secondary | ICD-10-CM

## 2019-01-16 DIAGNOSIS — I214 Non-ST elevation (NSTEMI) myocardial infarction: Secondary | ICD-10-CM

## 2019-01-16 DIAGNOSIS — I959 Hypotension, unspecified: Secondary | ICD-10-CM

## 2019-01-16 DIAGNOSIS — I501 Left ventricular failure: Secondary | ICD-10-CM

## 2019-01-16 DIAGNOSIS — J9601 Acute respiratory failure with hypoxia: Secondary | ICD-10-CM

## 2019-01-16 DIAGNOSIS — R570 Cardiogenic shock: Secondary | ICD-10-CM

## 2019-01-16 LAB — COMPREHENSIVE METABOLIC PANEL
ALT: 73 U/L — ABNORMAL HIGH (ref 0–44)
ALT: 183 U/L — ABNORMAL HIGH (ref 0–44)
AST: 384 U/L — ABNORMAL HIGH (ref 15–41)
AST: 544 U/L — ABNORMAL HIGH (ref 15–41)
Albumin: 2.7 g/dL — ABNORMAL LOW (ref 3.5–5.0)
Albumin: 2.9 g/dL — ABNORMAL LOW (ref 3.5–5.0)
Alkaline Phosphatase: 53 U/L (ref 38–126)
Alkaline Phosphatase: 65 U/L (ref 38–126)
Anion gap: 6 (ref 5–15)
Anion gap: 14 (ref 5–15)
BUN: 27 mg/dL — ABNORMAL HIGH (ref 8–23)
BUN: 32 mg/dL — ABNORMAL HIGH (ref 8–23)
CO2: 18 mmol/L — ABNORMAL LOW (ref 22–32)
CO2: 18 mmol/L — ABNORMAL LOW (ref 22–32)
Calcium: 7.1 mg/dL — ABNORMAL LOW (ref 8.9–10.3)
Calcium: 7.6 mg/dL — ABNORMAL LOW (ref 8.9–10.3)
Chloride: 114 mmol/L — ABNORMAL HIGH (ref 98–111)
Chloride: 110 mmol/L (ref 98–111)
Creatinine, Ser: 1.88 mg/dL — ABNORMAL HIGH (ref 0.61–1.24)
Creatinine, Ser: 2.44 mg/dL — ABNORMAL HIGH (ref 0.61–1.24)
GFR calc Af Amer: 38 mL/min — ABNORMAL LOW (ref >60)
GFR calc Af Amer: 28 mL/min — ABNORMAL LOW (ref >60)
GFR calc non Af Amer: 33 mL/min — ABNORMAL LOW (ref >60)
GFR calc non Af Amer: 24 mL/min — ABNORMAL LOW (ref >60)
Glucose, Bld: 131 mg/dL — ABNORMAL HIGH (ref 70–99)
Glucose, Bld: 136 mg/dL — ABNORMAL HIGH (ref 70–99)
Potassium: 4.2 mmol/L (ref 3.5–5.1)
Potassium: 4.8 mmol/L (ref 3.5–5.1)
Sodium: 138 mmol/L (ref 135–145)
Sodium: 142 mmol/L (ref 135–145)
Total Bilirubin: 0.8 mg/dL (ref 0.3–1.2)
Total Bilirubin: 0.8 mg/dL (ref 0.3–1.2)
Total Protein: 5.3 g/dL — ABNORMAL LOW (ref 6.5–8.1)
Total Protein: 5.6 g/dL — ABNORMAL LOW (ref 6.5–8.1)

## 2019-01-16 LAB — HEPARIN LEVEL (UNFRACTIONATED)
Heparin Unfractionated: <0.1 IU/mL — ABNORMAL LOW (ref 0.30–0.70)
Heparin Unfractionated: 1.09 IU/mL — ABNORMAL HIGH (ref 0.30–0.70)
Heparin Unfractionated: 1.08 IU/mL — ABNORMAL HIGH (ref 0.30–0.70)
Heparin Unfractionated: 0.58 IU/mL (ref 0.30–0.70)

## 2019-01-16 LAB — PROTIME-INR
INR: 1.5 — ABNORMAL HIGH (ref 0.8–1.2)
Prothrombin Time: 18.4 seconds — ABNORMAL HIGH (ref 11.4–15.2)

## 2019-01-16 LAB — MAGNESIUM
Magnesium: 1.5 mg/dL — ABNORMAL LOW (ref 1.7–2.4)
Magnesium: 2.2 mg/dL (ref 1.7–2.4)

## 2019-01-16 LAB — URINE CULTURE: Culture: NO GROWTH

## 2019-01-16 LAB — CBC
HCT: 33.9 % — ABNORMAL LOW (ref 39.0–52.0)
HCT: 36.4 % — ABNORMAL LOW (ref 39.0–52.0)
HCT: 40.6 % (ref 39.0–52.0)
Hemoglobin: 10.6 g/dL — ABNORMAL LOW (ref 13.0–17.0)
Hemoglobin: 11.3 g/dL — ABNORMAL LOW (ref 13.0–17.0)
Hemoglobin: 12.7 g/dL — ABNORMAL LOW (ref 13.0–17.0)
MCH: 30 pg (ref 26.0–34.0)
MCH: 29.8 pg (ref 26.0–34.0)
MCH: 30 pg (ref 26.0–34.0)
MCHC: 31.3 g/dL (ref 30.0–36.0)
MCHC: 31 g/dL (ref 30.0–36.0)
MCHC: 31.3 g/dL (ref 30.0–36.0)
MCV: 96 fL (ref 80.0–100.0)
MCV: 96 fL (ref 80.0–100.0)
MCV: 95.8 fL (ref 80.0–100.0)
Platelets: 214 10*3/uL (ref 150–400)
Platelets: 222 10*3/uL (ref 150–400)
Platelets: 253 10*3/uL (ref 150–400)
RBC: 3.53 MIL/uL — ABNORMAL LOW (ref 4.22–5.81)
RBC: 3.79 MIL/uL — ABNORMAL LOW (ref 4.22–5.81)
RBC: 4.24 MIL/uL (ref 4.22–5.81)
RDW: 14.2 % (ref 11.5–15.5)
RDW: 14.1 % (ref 11.5–15.5)
RDW: 14.1 % (ref 11.5–15.5)
WBC: 49.9 10*3/uL — ABNORMAL HIGH (ref 4.0–10.5)
WBC: 47.9 10*3/uL — ABNORMAL HIGH (ref 4.0–10.5)
WBC: 57 10*3/uL (ref 4.0–10.5)
nRBC: 0 % (ref 0.0–0.2)
nRBC: 0 % (ref 0.0–0.2)
nRBC: 0 % (ref 0.0–0.2)

## 2019-01-16 LAB — TROPONIN I (HIGH SENSITIVITY)
Troponin I (High Sensitivity): >27858 ng/L (ref <18)
Troponin I (High Sensitivity): >26548 ng/L (ref <18)

## 2019-01-16 LAB — CORTISOL: Cortisol, Plasma: >100 ug/dL

## 2019-01-16 LAB — ECHOCARDIOGRAM LIMITED
Height: 68 in
Weight: 2948.87 oz

## 2019-01-16 LAB — PROCALCITONIN: Procalcitonin: 104.05 ng/mL

## 2019-01-16 LAB — LACTIC ACID, PLASMA: Lactic Acid, Venous: 4.7 mmol/L (ref 0.5–1.9)

## 2019-01-16 LAB — MRSA PCR SCREENING: MRSA by PCR: NEGATIVE

## 2019-01-16 LAB — BRAIN NATRIURETIC PEPTIDE: B Natriuretic Peptide: 4082.7 pg/mL — ABNORMAL HIGH (ref 0.0–100.0)

## 2019-01-16 MED ORDER — HEPARIN BOLUS VIA INFUSION
2000.0000 [IU] | Freq: Once | INTRAVENOUS | Status: AC
  Administered 2019-01-16: 2000 [IU] via INTRAVENOUS
  Filled 2019-01-16: qty 2000

## 2019-01-16 MED ORDER — FUROSEMIDE 10 MG/ML IJ SOLN
80.0000 mg | Freq: Once | INTRAMUSCULAR | Status: AC
  Administered 2019-01-16: 80 mg via INTRAVENOUS
  Filled 2019-01-16: qty 8

## 2019-01-16 MED ORDER — METOPROLOL TARTRATE 12.5 MG HALF TABLET
12.5000 mg | ORAL_TABLET | Freq: Two times a day (BID) | ORAL | Status: DC
  Administered 2019-01-16: 12.5 mg via ORAL
  Filled 2019-01-16: qty 1

## 2019-01-16 MED ORDER — NOREPINEPHRINE 16 MG/250ML-% IV SOLN
0.0000 ug/min | INTRAVENOUS | Status: DC
  Filled 2019-01-16: qty 250

## 2019-01-16 MED ORDER — HEPARIN (PORCINE) 25000 UT/250ML-% IV SOLN
1300.0000 [IU]/h | INTRAVENOUS | Status: DC
  Administered 2019-01-18: 1000 [IU]/h via INTRAVENOUS
  Administered 2019-01-19: 1400 [IU]/h via INTRAVENOUS
  Filled 2019-01-16 (×3): qty 250

## 2019-01-16 MED ORDER — ASPIRIN 81 MG PO CHEW
324.0000 mg | CHEWABLE_TABLET | Freq: Once | ORAL | Status: AC
  Administered 2019-01-16: 324 mg via ORAL
  Filled 2019-01-16: qty 4

## 2019-01-16 MED ORDER — MAGNESIUM SULFATE 2 GM/50ML IV SOLN
2.0000 g | Freq: Once | INTRAVENOUS | Status: AC
  Administered 2019-01-16: 2 g via INTRAVENOUS
  Filled 2019-01-16: qty 50

## 2019-01-16 MED ORDER — PERFLUTREN LIPID MICROSPHERE
1.0000 mL | INTRAVENOUS | Status: AC | PRN
  Administered 2019-01-16: 1 mL via INTRAVENOUS
  Filled 2019-01-16: qty 10

## 2019-01-16 MED ORDER — CHLORHEXIDINE GLUCONATE CLOTH 2 % EX PADS
6.0000 | MEDICATED_PAD | Freq: Every day | CUTANEOUS | Status: DC
  Administered 2019-01-16 – 2019-01-30 (×14): 6 via TOPICAL

## 2019-01-16 MED ORDER — FUROSEMIDE 10 MG/ML IJ SOLN
80.0000 mg | Freq: Once | INTRAMUSCULAR | Status: AC
  Administered 2019-01-16: 80 mg via INTRAVENOUS

## 2019-01-16 MED ORDER — NOREPINEPHRINE 4 MG/250ML-% IV SOLN
0.0000 ug/min | INTRAVENOUS | Status: DC
  Administered 2019-01-16: 7 ug/min via INTRAVENOUS
  Administered 2019-01-17: 8 ug/min via INTRAVENOUS
  Filled 2019-01-16 (×2): qty 250

## 2019-01-16 MED ORDER — HEPARIN (PORCINE) 25000 UT/250ML-% IV SOLN
1000.0000 [IU]/h | INTRAVENOUS | Status: DC
  Administered 2019-01-16: 1000 [IU]/h via INTRAVENOUS
  Filled 2019-01-16: qty 250

## 2019-01-16 MED ORDER — FUROSEMIDE 10 MG/ML IJ SOLN
INTRAMUSCULAR | Status: AC
  Filled 2019-01-16: qty 8

## 2019-01-16 MED ORDER — CLOPIDOGREL BISULFATE 75 MG PO TABS
600.0000 mg | ORAL_TABLET | Freq: Once | ORAL | Status: AC
  Administered 2019-01-16: 600 mg via ORAL
  Filled 2019-01-16: qty 8

## 2019-01-16 MED ORDER — MORPHINE SULFATE (PF) 2 MG/ML IV SOLN
2.0000 mg | INTRAVENOUS | Status: DC | PRN
  Administered 2019-01-16 – 2019-01-17 (×6): 2 mg via INTRAVENOUS
  Filled 2019-01-16 (×7): qty 1

## 2019-01-16 MED ORDER — CLOPIDOGREL BISULFATE 75 MG PO TABS
75.0000 mg | ORAL_TABLET | Freq: Every day | ORAL | Status: DC
  Filled 2019-01-16: qty 1

## 2019-01-16 MED ORDER — LEVALBUTEROL HCL 0.63 MG/3ML IN NEBU
0.6300 mg | INHALATION_SOLUTION | Freq: Four times a day (QID) | RESPIRATORY_TRACT | Status: DC | PRN
  Administered 2019-01-17 (×3): 0.63 mg via RESPIRATORY_TRACT
  Filled 2019-01-16 (×3): qty 3

## 2019-01-16 MED ORDER — GUAIFENESIN 100 MG/5ML PO SOLN
5.0000 mL | ORAL | Status: DC | PRN
  Administered 2019-01-16: 100 mg via ORAL
  Filled 2019-01-16: qty 5

## 2019-01-16 MED ORDER — FUROSEMIDE 10 MG/ML IJ SOLN: 40.0000 mg | Freq: Three times a day (TID) | INTRAMUSCULAR | Status: DC

## 2019-01-16 NOTE — Progress Notes (Signed)
Critical Troponin greater than 26,548 reported to Dr. Darrell Jewel.

## 2019-01-16 NOTE — Progress Notes (Signed)
ANTICOAGULATION CONSULT NOTE  Pharmacy Consult for:  Heparin Indication: ACS/STEMI  No Known Allergies  Patient Measurements: Height: 5\' 8"  (172.7 cm) Weight: 184 lb 4.9 oz (83.6 kg) IBW/kg (Calculated) : 68.4 HEPARIN DW (KG): 86 kg  Vital Signs: Temp: 97.2 F (36.2 C) (11/21 1530) Temp Source: Axillary (11/21 1530) BP: 95/69 (11/21 2200) Pulse Rate: 78 (11/21 2200)  Labs: Recent Labs    01/01/2019 1207 01/16/19 0230  01/16/19 0344 01/16/19 0442 01/16/19 1024 01/16/19 1257 01/16/19 2145  HGB 12.7* 10.6*  --  11.3*  --  12.7*  --   --   HCT 39.8 33.9*  --  36.4*  --  40.6  --   --   PLT 210 214  --  222  --  253  --   --   APTT 29  --   --   --   --   --   --   --   LABPROT 14.8 18.4*  --   --   --   --   --   --   INR 1.2 1.5*  --   --   --   --   --   --   HEPARINUNFRC  --   --    < > <0.10*  --  1.09* 1.08* 0.58  CREATININE 1.65* 1.88*  --   --   --  2.44*  --   --   TROPONINIHS  --  >27,858.0*  --   --  >26,548.0*  --   --   --    < > = values in this interval not displayed.    Estimated Creatinine Clearance: 25 mL/min (A) (by C-G formula based on SCr of 2.44 mg/dL (H)).   Assessment: 83 yo male with elevated troponin to continue on IV heparin.  Heparin level is therapeutic post rate adjustment earlier today.  No bleeding reported.  Goal of Therapy:  Heparin level 0.3-0.7 units/ml Monitor platelets by anticoagulation protocol: Yes   Plan:  Continue heparin gtt at 800 units/hr F/U AM labs  Chrystie Hagwood D. Mina Marble, PharmD, BCPS, [place] 01/16/2019, 10:23 PM

## 2019-01-16 NOTE — Progress Notes (Signed)
Pt reports chest pain has lessened but is still there. Still experiencing increased work of breathing and audible weezing. Pt understands he is pending transfer to [facility]. Updated his wife. She asks that should he transfer before 0900 that we call her.

## 2019-01-16 NOTE — Progress Notes (Signed)
  Blanchard Kelch developed chest pain with diaphoresis overnight- - Dr. Clearence Ped, [place] consulted cardiology overnight, -Dr. Clearence Ped, [place] also consulted PCCM overnight- As per Dr. Clearence Ped, [place] Patient was accepted to [facility], ICU under PCCM service with cardiology consult- Patient is seen at [facility], ICU this a.m. prior to transfer to [place], MD

## 2019-01-16 NOTE — Progress Notes (Addendum)
Pt having multiple PVC's, PAC's, Vtach, and missed beats. All are asymptomatic.    Received verbal order for STAT mag, troponin and 12 lead EKG

## 2019-01-16 NOTE — Progress Notes (Signed)
Nurse notified that patient was having non-sustained Vtach this evening. Patient was asymptomatic, mag was ordered. Patient then became diaphoretic, but without chest pain. 12 lead EKG was ordered, as well as troponin. First EKG showed: Sinus rhythm with 1st degree A-V block with Fusion complexes and Premature atrial complexes with Abberant conduction Anteroseptal infarct , age undetermined. Second EKG in the same minute showed: Sinus rhythm with marked sinus arrhythmia with 1st degree A-V block with ventricular escape complexes Anteroseptal infarct , age undetermined. No ST elevation seen. The mag reported back at 1.5, and trop 27,858. Heparin drip started. I personally called to speak with cardiology Dr. Oval Linsey. She reports that patient will need a cath on Monday, but will not need an emergent cath. Flow manager reports that there are no cardiac tele or progressive beds available. Transfer order has been placed. While he is here, we will continue trend troponins. Replace mag, repeat EKGs, and monitor until bed is available anywhere.

## 2019-01-16 NOTE — Progress Notes (Addendum)
Patient Demographics:    Duane Burns, is a 83 y.o. male, DOB - 1936-01-14, VOZ:[id number]  Admit date - 01/04/2019   Admitting Physician Rushie Brazel Denton Brick, MD  Outpatient Primary MD for the patient is [name], DO  LOS - 1  Chief Complaint  Patient presents with   Code Sepsis        Subjective:    Blanchard Kelch developed chest pain with diaphoresis overnight- - Dr. Clearence Ped, [place] consulted cardiology overnight, -Dr. Clearence Ped, [place] also consulted PCCM overnight- As per Dr. Clearence Ped, [place] Patient was accepted to [facility], ICU under PCCM service with cardiology consult- Patient is seen at [facility], ICU this a.m. prior to transfer to [place] :    Principal Problem:   Sepsis [facility]) Active Problems:   Essential hypertension   Myasthenia gravis [facility])  Brief Summary 83 y.o. male with past medical history relevant for myasthenia gravis on chronic Mestinon and prednisone, HTN, HLD, and prior history of prostate cancer presented admitted to [facility] on 01/23/2019 with hypotension and high fevers consistent with sepsis and septic shock -Required IV Levophed for pressure support  -Overnight patient developed chest pains with elevated troponin -Dr. Clearence Ped, [place] consulted cardiology overnight, -Dr. Clearence Ped, [place] also consulted PCCM overnight- As per Dr. Clearence Ped, [place] Patient was accepted to [facility], ICU under PCCM service with cardiology consult-     A/p 1) chest pains/NSTEMI--- troponin over 27,000 , EKG without acute ST changes --- please note that patient had persistent hypotension secondary to septic shock over the last 24 hours requiring aggressive IV fluids as well as IV Levophed for pressure support  -Troponin now elevated in the setting of persistent hypotension (septic shock) -received Plavix per cardiology  recommendation, -Continue IV heparin -Continue metoprolol - echo from 01/16/2019 with EF of 60 to 65%, with grade 1 diastolic dysfunction with moderate to severe AS and moderate aortic regurg  2)HFpEF----patient with hypoxia and increased work of breathing, clinical exam and chest x-ray consistent with CHF suspected due to aggressive IV hydration for sepsis with septic shock over the last 24 hours- -IV Lasix 80 mg x 1 given -Patient will need close monitoring of input/output and daily weights and electrolytes  3) cardiac arrhythmia--nonsustained V. Tach, magnesium was 1.5, replaced,  -cardiology consult pending--please see #1 and #2 above  4)Sepsis with septic shock of unknown Etiology--patient with recent E. coli bacteremia at [facility]--- -blood cultures from 12/14/2018 at [facility] with positive for E. coli, repeat blood cultures 12/20/2018 was negative -Patient was treated with Rocephin at [facility] iv  meropenem for possible ESBL organism -Persistently low BP in the setting of sepsis and concerns about septic shock -Patient met sepsis criteria on admission with fever, tachypnea, tachycardia, leukocytosis and persistent hypotension on presentation -CT chest abdomen and pelvis shows pericholecystic edema and left renal stones  lactic acid down to 2.9 from 3.7, WBC up to 49.9  however patient is on chronic prednisone therapy -Despite aggressive IV fluids patient remains persistently hypotensive requiring IV Levophed for pressure support -Blood cultures pending -Patient received stress dose IV Solu-Cortef due to persistent hypotension and the patient was chronically on steroids -Continue IV Levophed to keep MAP close to 60 -Continue  Vanco and meropenem for sepsis with septic shock requiring IV Levophed for pressure support  5)Possible Cholecystitis--- CT abdomen suggest possible cholecystitis, recent work-up at [facility] in October 2020 revealed possible  chronic cholecystitis pain HIDA scan with morphine (cystic duct was patent and there was no evidence of acute cholecystitis) -AST up to 384 from 52, ALT up to 73,T bili 0.8 -Abdominal ultrasound requested -LFT elevation may be partly due to shock liver with persistent hypotension and hypoperfusion   6)HTN--continue to Hold Lisinopril due to AKI and hypotension  7)History of moderate to severe aortic stenosis----presented with hypotension most likely secondary to sepsis rather than AS, repeat echo noted as above #1  8)Social/Ethics--plan of care and advanced directives discussed with patient and his wife, -patient is a full code Unable to reach daughter at [phone]  68) myasthenia gravis--continue Mestinon, hold prednisone, -patient received stress dose IV Solu-Cortef due to persistent hypotension and the patient was chronically on steroids  10)AKI----acute kidney injury secondary to sepsis with hypotension   creatinine on admission= 1.65,   baseline creatinine = 1.0   , creatinine is now up to 1.8 --suspect due to persistent hypotension with renal hypoperfusion, please note that patient did have low-dose contrast with CT chest abdomen and pelvis on admission - renally adjust medications, avoid nephrotoxic agents/dehydration/hypotension -Patient had recent AKI as well with his creatinine was as high as 4.0 on 12/14/2018 during admission to [facility] lisinopril -Monitor renal function closely especially with CHF and need for IV Lasix  11)subacute/chronic left hip fracture--- evaluated by orthopedic surgeon at [facility] patient told no operative treatment needed at this time  12) acute hypoxic respiratory failure--- patient requiring high flow oxygen/nonrebreather bag --- hypoxic respiratory failure is secondary to #1 and #2 above, managed as above   Disposition/Need for in-Hospital Stay- patient unable to be discharged at this time due to unstable hemodynamics  requiring IV Levophed -- Dr. Clearence Ped, [place] consulted cardiology overnight, -Dr. Clearence Ped, [place] also consulted PCCM overnight- As per Dr. Clearence Ped, [place] Patient was accepted to [facility] ICU under PCCM service with cardiology consult-  Code Status : Full  Family Communication:   (patient is alert, awake and coherent) -Discussed with wife  Disposition Plan  : Transfer to ICU at [facility]   Consults  :  PCCM/Cardiology  DVT Prophylaxis  : IV heparin /SCDs   Lab Results  Component Value Date   PLT 222 01/16/2019    Inpatient Medications  Scheduled Meds:  aspirin EC  81 mg Oral Daily   Chlorhexidine Gluconate Cloth  6 each Topical Daily   cholecalciferol  1,000 Units Oral Daily   [START ON 01/17/2019] clopidogrel  75 mg Oral Daily   furosemide       hydrocortisone sod succinate (SOLU-CORTEF) inj  50 mg Intravenous Q8H   metoprolol tartrate  12.5 mg Oral BID   multivitamin with minerals  1 tablet Oral Daily   pyridostigmine  60 mg Oral TID   senna-docusate  2 tablet Oral BID   sodium chloride flush  3 mL Intravenous Q12H   Continuous Infusions:  sodium chloride     sodium chloride 150 mL/hr at 01/16/19 0055   heparin 1,000 Units/hr (01/16/19 0407)   meropenem (MERREM) IV 1 g (01/16/19 0509)   norepinephrine (LEVOPHED) Adult infusion 15 mcg/min (01/16/19 0824)   vancomycin     PRN Meds:.sodium chloride, acetaminophen **OR** acetaminophen, guaiFENesin, levalbuterol, morphine injection, ondansetron **OR** ondansetron (ZOFRAN) IV, polyethylene glycol, sodium chloride flush, traZODone  Anti-infectives (From admission, onward)   Start     Dose/Rate Route Frequency Ordered Stop   01/16/19 1200  vancomycin (VANCOCIN) IVPB 1000 mg/200 mL premix     1,000 mg 200 mL/hr over 60 Minutes Intravenous Every 24 hours 12/27/2018 1753     12/29/2018 1800  meropenem (MERREM) 1 g in sodium chloride 0.9 % 100 mL IVPB     1 g 200 mL/hr over 30 Minutes Intravenous Every  12 hours 01/10/2019 1751     01/14/2019 1300  vancomycin (VANCOCIN) IVPB 1000 mg/200 mL premix     1,000 mg 200 mL/hr over 60 Minutes Intravenous  Once 01/12/2019 1247 01/23/2019 1404        Objective:   Vitals:   01/16/19 0500 01/16/19 0515 01/16/19 0530 01/16/19 0756  BP: (!) 104/56 97/80 106/76   Pulse: 83 74 81   Resp: (!) 22 (!) 39 18   Temp:    (!) 97.4 F (36.3 C)  TempSrc:    Oral  SpO2: 93% 91% 91%   Weight:      Height:        Wt Readings from Last 3 Encounters:  01/16/19 83.6 kg  11/17/18 87.1 kg  03/03/18 87.5 kg     Intake/Output Summary (Last 24 hours) at 01/16/2019 0943 Last data filed at 01/16/2019 0415 Gross per 24 hour  Intake 5710.26 ml  Output 300 ml  Net 5410.26 ml     Physical Exam  Gen:- Awake Alert, increased work of breathing, conversational dyspnea  HEENT:- [place].AT, No sclera icterus Nose- NRB Neck-Supple Neck, Lungs-diminished breath sounds with  bibasilar rales  CV- S1, S2 normal, irregular but not irregularly irregular , 3/6SM abd-  +ve B.Sounds, Abd Soft, No tenderness,    Extremity/Skin:- No  edema, pedal pulses present, diaphoretic Psych-affect is appropriate, oriented x3 Neuro-no new focal deficits, no tremors MSK-right groin femoral catheter in situ   Data Review:   Micro Results Recent Results (from the past 240 hour(s))  Blood Culture (routine x 2)     Status: None (Preliminary result)   Collection Time: 01/19/2019 12:07 PM   Specimen: BLOOD RIGHT FOREARM  Result Value Ref Range Status   Specimen Description BLOOD RIGHT FOREARM DRAWN BY RN  Final   Special Requests   Final    BOTTLES DRAWN AEROBIC AND ANAEROBIC Blood Culture adequate volume   Culture   Final    NO GROWTH < 24 HOURS Performed at [facility], [address] [place], [place] [address]    Report Status PENDING  Incomplete  Blood Culture (routine x 2)     Status: None (Preliminary result)   Collection Time: 01/24/2019 12:17 PM   Specimen: BLOOD LEFT HAND  Result  Value Ref Range Status   Specimen Description BLOOD LEFT HAND  Final   Special Requests   Final    BOTTLES DRAWN AEROBIC AND ANAEROBIC Blood Culture results may not be optimal due to an inadequate volume of blood received in culture bottles   Culture   Final    NO GROWTH < 24 HOURS Performed at [facility], [address] [place], [place] [address]    Report Status PENDING  Incomplete  SARS Coronavirus 2 by RT PCR (hospital order, performed in [place] hospital lab) Nasopharyngeal Nasopharyngeal Swab     Status: None   Collection Time: 01/12/2019  1:05 PM   Specimen: Nasopharyngeal Swab  Result Value Ref Range Status   SARS Coronavirus 2 NEGATIVE NEGATIVE Final  Comment: (NOTE) If result is NEGATIVE SARS-CoV-2 target nucleic acids are NOT DETECTED. The SARS-CoV-2 RNA is generally detectable in upper and lower  respiratory specimens during the acute phase of infection. The lowest  concentration of SARS-CoV-2 viral copies this assay can detect is 250  copies / mL. A negative result does not preclude SARS-CoV-2 infection  and should not be used as the sole basis for treatment or other  patient management decisions.  A negative result may occur with  improper specimen collection / handling, submission of specimen other  than nasopharyngeal swab, presence of viral mutation(s) within the  areas targeted by this assay, and inadequate number of viral copies  (<250 copies / mL). A negative result must be combined with clinical  observations, patient history, and epidemiological information. If result is POSITIVE SARS-CoV-2 target nucleic acids are DETECTED. The SARS-CoV-2 RNA is generally detectable in upper and lower  respiratory specimens dur ing the acute phase of infection.  Positive  results are indicative of active infection with SARS-CoV-2.  Clinical  correlation with patient history and other diagnostic information is  necessary to determine patient infection status.  Positive  results do  not rule out bacterial infection or co-infection with other viruses. If result is PRESUMPTIVE POSTIVE SARS-CoV-2 nucleic acids MAY BE PRESENT.   A presumptive positive result was obtained on the submitted specimen  and confirmed on repeat testing.  While 2019 novel coronavirus  (SARS-CoV-2) nucleic acids may be present in the submitted sample  additional confirmatory testing may be necessary for epidemiological  and / or clinical management purposes  to differentiate between  SARS-CoV-2 and other Sarbecovirus currently known to infect humans.  If clinically indicated additional testing with an alternate test  methodology [phone]) is advised. The SARS-CoV-2 RNA is generally  detectable in upper and lower respiratory sp ecimens during the acute  phase of infection. The expected result is Negative. Fact Sheet for Patients:  StrictlyIdeas.no Fact Sheet for Healthcare Providers: BankingDealers.co.za This test is not yet approved or cleared by the [place] FDA and has been authorized for detection and/or diagnosis of SARS-CoV-2 by FDA under an Emergency Use Authorization (EUA).  This EUA will remain in effect (meaning this test can be used) for the duration of the COVID-19 declaration under Section 564(b)(1) of the Act, 21 U.S.C. section 360bbb-3(b)(1), unless the authorization is terminated or revoked sooner. Performed at [facility], [address] [place], [place] [address]   MRSA PCR Screening     Status: None   Collection Time: 12/31/2018  4:35 PM   Specimen: Nasal Mucosa; Nasopharyngeal  Result Value Ref Range Status   MRSA by PCR NEGATIVE NEGATIVE Final    Comment:        The GeneXpert MRSA Assay (FDA approved for NASAL specimens only), is one component of a comprehensive MRSA colonization surveillance program. It is not intended to diagnose MRSA infection nor to guide or monitor treatment for MRSA  infections. Performed at [facility], [address] [place], [place] [address]     Radiology Reports Dg Abdomen 1 View  Result Date: 01/11/2019 CLINICAL DATA:  Central line placement. EXAM: ABDOMEN - 1 VIEW COMPARISON:  None. FINDINGS: Right femoral vein catheter has been inserted. The tip is in the region of the right iliac vein at the level of the S1 segment of the sacrum. The visualized bowel gas pattern is normal. No acute bone abnormality. IMPRESSION: 1. Central line tip is in the right iliac vein at the level  of the S1 segment of the sacrum. 2. Benign-appearing abdomen. Electronically Signed   By: Lorriane Shire M.D.   On: 01/21/2019 15:02   Ct Chest W Contrast  Result Date: 01/21/2019 CLINICAL DATA:  Fever of unknown origin, sepsis, leukocytosis. EXAM: CT CHEST, ABDOMEN, AND PELVIS WITH CONTRAST TECHNIQUE: Multidetector CT imaging of the chest, abdomen and pelvis was performed following the standard protocol during bolus administration of intravenous contrast. CONTRAST:  31m OMNIPAQUE IOHEXOL 300 MG/ML  SOLN COMPARISON:  CT abdomen pelvis 12/14/2018 and CT chest 07/22/2018. FINDINGS: CT CHEST FINDINGS Cardiovascular: Atherosclerotic calcification of the aorta, aortic valve and coronary arteries. Heart is enlarged. No pericardial effusion. Mediastinum/Nodes: 1.6 cm low-attenuation left thyroid nodule is unchanged. No pathologically enlarged mediastinal, hilar or axillary lymph nodes. Esophagus is unremarkable. Lungs/Pleura: Fairly diffuse septal thickening. Mild dependent atelectasis bilaterally. No pleural fluid. Debris is seen dependently in the lower trachea and mainstem bronchi. Musculoskeletal: Degenerative changes in the spine. No worrisome lytic or sclerotic lesions. CT ABDOMEN PELVIS FINDINGS Hepatobiliary: Hyperattenuating lesion in the dome of the liver measures 1.2 cm (2/42) and is nonspecific, possibly representing a flash fill hemangioma or perfusion anomaly. Gallbladder appears  mildly distended and there is surrounding haziness and stranding, similar to 12/14/2018. No biliary ductal dilatation. Pancreas: Negative. Spleen: Negative. Adrenals/Urinary Tract: Adrenal glands and right kidney are unremarkable. Stones are seen in the left kidney. Probable left renal sinus cysts. Ureters are decompressed. Bladder is low in volume. Stomach/Bowel: Stomach, small bowel, appendix and colon are unremarkable. Vascular/Lymphatic: Atherosclerotic calcification of the aorta without aneurysm. Right-sided femoral venous line terminates near the bifurcation of the internal and external iliac veins on the right. No pathologically enlarged lymph nodes. Reproductive: Prostatectomy. Other: Small bilateral inguinal hernias contain fat. Tiny amount of fluid in the right paracolic gutter. Mesenteries and peritoneum are otherwise unremarkable. Musculoskeletal: Right hip arthroplasty. Degenerative changes in the left hip and spine. IMPRESSION: 1. Mild pericholecystic edema, similar to 12/14/2018. A common bile duct stone was questioned at that time. Consider right upper quadrant ultrasound in further evaluation for acute cholecystitis. Otherwise, no findings to explain the patient's fever and leukocytosis. 2. Pulmonary edema. 3. Left renal stones. 4. Aortic atherosclerosis (ICD10-170.0). Coronary artery calcification. Electronically Signed   By: MLorin PicketM.D.   On: 01/13/2019 16:34   Ct Abdomen Pelvis W Contrast  Result Date: 01/11/2019 CLINICAL DATA:  Fever of unknown origin, sepsis, leukocytosis. EXAM: CT CHEST, ABDOMEN, AND PELVIS WITH CONTRAST TECHNIQUE: Multidetector CT imaging of the chest, abdomen and pelvis was performed following the standard protocol during bolus administration of intravenous contrast. CONTRAST:  786mOMNIPAQUE IOHEXOL 300 MG/ML  SOLN COMPARISON:  CT abdomen pelvis 12/14/2018 and CT chest 07/22/2018. FINDINGS: CT CHEST FINDINGS Cardiovascular: Atherosclerotic calcification of the  aorta, aortic valve and coronary arteries. Heart is enlarged. No pericardial effusion. Mediastinum/Nodes: 1.6 cm low-attenuation left thyroid nodule is unchanged. No pathologically enlarged mediastinal, hilar or axillary lymph nodes. Esophagus is unremarkable. Lungs/Pleura: Fairly diffuse septal thickening. Mild dependent atelectasis bilaterally. No pleural fluid. Debris is seen dependently in the lower trachea and mainstem bronchi. Musculoskeletal: Degenerative changes in the spine. No worrisome lytic or sclerotic lesions. CT ABDOMEN PELVIS FINDINGS Hepatobiliary: Hyperattenuating lesion in the dome of the liver measures 1.2 cm (2/42) and is nonspecific, possibly representing a flash fill hemangioma or perfusion anomaly. Gallbladder appears mildly distended and there is surrounding haziness and stranding, similar to 12/14/2018. No biliary ductal dilatation. Pancreas: Negative. Spleen: Negative. Adrenals/Urinary Tract: Adrenal glands and right kidney are unremarkable.  Stones are seen in the left kidney. Probable left renal sinus cysts. Ureters are decompressed. Bladder is low in volume. Stomach/Bowel: Stomach, small bowel, appendix and colon are unremarkable. Vascular/Lymphatic: Atherosclerotic calcification of the aorta without aneurysm. Right-sided femoral venous line terminates near the bifurcation of the internal and external iliac veins on the right. No pathologically enlarged lymph nodes. Reproductive: Prostatectomy. Other: Small bilateral inguinal hernias contain fat. Tiny amount of fluid in the right paracolic gutter. Mesenteries and peritoneum are otherwise unremarkable. Musculoskeletal: Right hip arthroplasty. Degenerative changes in the left hip and spine. IMPRESSION: 1. Mild pericholecystic edema, similar to 12/14/2018. A common bile duct stone was questioned at that time. Consider right upper quadrant ultrasound in further evaluation for acute cholecystitis. Otherwise, no findings to explain the  patient's fever and leukocytosis. 2. Pulmonary edema. 3. Left renal stones. 4. Aortic atherosclerosis (ICD10-170.0). Coronary artery calcification. Electronically Signed   By: Lorin Picket M.D.   On: 01/10/2019 16:34   Dg Chest Port 1 View  Result Date: 01/16/2019 CLINICAL DATA:  Wheezing and left-sided chest pain EXAM: PORTABLE CHEST 1 VIEW COMPARISON:  Chest CT from yesterday FINDINGS: Diffuse interstitial opacity with Kerley lines. There is patchy airspace opacity as well. Mild cardiomegaly. Elevated right diaphragm which is also seen previously. No visible effusion or pneumothorax IMPRESSION: CHF pattern which is worsened. Electronically Signed   By: Monte Fantasia M.D.   On: 01/16/2019 07:15   Dg Chest Port 1 View  Result Date: 12/28/2018 CLINICAL DATA:  Fever. Sudden onset of weakness last night. EXAM: PORTABLE CHEST 1 VIEW COMPARISON:  Radiographs 12/14/2018 and 08/04/2017.  CT 07/22/2018. FINDINGS: 1212 hours. The heart size and mediastinal contours are stable with aortic atherosclerosis. There is stable chronic elevation/eventration of the right hemidiaphragm with associated chronic right basilar atelectasis. The lungs are otherwise clear. There is no pleural effusion or pneumothorax. The bones appear unremarkable. Telemetry leads overlie the chest. IMPRESSION: Stable postoperative chest with chronic right basilar atelectasis. No acute cardiopulmonary process. Electronically Signed   By: Richardean Sale M.D.   On: 01/06/2019 12:42     CBC Recent Labs  Lab 01/23/2019 1207 01/16/19 0230 01/16/19 0344  WBC 37.8* 49.9* 47.9*  HGB 12.7* 10.6* 11.3*  HCT 39.8 33.9* 36.4*  PLT 210 214 222  MCV 92.3 96.0 96.0  MCH 29.5 30.0 29.8  MCHC 31.9 31.3 31.0  RDW 13.5 14.2 14.1  LYMPHSABS 0.7  --   --   MONOABS 0.9  --   --   EOSABS 0.3  --   --   BASOSABS 0.1  --   --     Chemistries  Recent Labs  Lab 01/12/2019 1207 01/16/19 0230  NA 140 138  K 3.8 4.2  CL 105 114*  CO2 22 18*   GLUCOSE 92 131*  BUN 25* 27*  CREATININE 1.65* 1.88*  CALCIUM 9.1 7.1*  MG  --  1.5*  AST 52* 384*  ALT 17 73*  ALKPHOS 61 53  BILITOT 0.9 0.8   ------------------------------------------------------------------------------------------------------------------ No results for input(s): CHOL, HDL, LDLCALC, TRIG, CHOLHDL, LDLDIRECT in the last 72 hours.  Lab Results  Component Value Date   HGBA1C 5.6 01/27/2018   ------------------------------------------------------------------------------------------------------------------ No results for input(s): TSH, T4TOTAL, T3FREE, THYROIDAB in the last 72 hours.  Invalid input(s): FREET3 ------------------------------------------------------------------------------------------------------------------ No results for input(s): VITAMINB12, FOLATE, FERRITIN, TIBC, IRON, RETICCTPCT in the last 72 hours.  Coagulation profile Recent Labs  Lab 12/29/2018 1207 01/16/19 0230  INR 1.2 1.5*    No results  for input(s): DDIMER in the last 72 hours.  Cardiac Enzymes No results for input(s): CKMB, TROPONINI, MYOGLOBIN in the last 168 hours.  Invalid input(s): CK ------------------------------------------------------------------------------------------------------------------ No results found for: BNP   Roxan Hockey M.D on 01/16/2019 at 9:43 AM  Go to [email or web address] - for contact info  [facility] - Office  [phone]

## 2019-01-16 NOTE — Progress Notes (Signed)
Report given to [facility]. Reports ETA of about 55min. RN called to inform patients wife of transfer to [facility] 15c-01

## 2019-01-16 NOTE — Progress Notes (Signed)
Echocardiogram 2D Echocardiogram has been performed.  Oneal Deputy Brace Welte 01/16/2019, 1:08 PM   Told Dr. Harl Bowie at 1:00

## 2019-01-16 NOTE — Progress Notes (Signed)
Pt still has no urine output. Bladder scan shows 250+. Pt reports having trouble urinating at times. Paged midlevel for possible in/out cath.

## 2019-01-16 NOTE — Progress Notes (Signed)
Report given to Belenda Cruise, RN on [place] at [facility]. Awaiting arrival of [facility] for transport.

## 2019-01-16 NOTE — Progress Notes (Signed)
Heparin infusing, mag infusing; pt is now diaphoretic and complaining of left sided chest pain, non radiating. Also nauseas. Administered PRN morphine.

## 2019-01-16 NOTE — H&P (Addendum)
NAME:  Duane Burns, MRN:  [id number], DOB:  19-Nov-1935, LOS: 1 ADMISSION DATE:  01/11/2019, CONSULTATION DATE:  01/15/09 REFERRING MD:  Darrell Jewel - [facility], CHIEF COMPLAINT:  Weakness, fevers  Brief History   83 yo M presented to [facility] ED with concerns for sepsis. Recent bacteremia treated at OSH. At [facility], developed chest pain. Transferred to [facility].   History of present illness    83 yo M PMH MG on mestinon and prednisone, prostate Ca, HTN, HLD, recent e coli bacteremia treated at [facility] (10/19), who presented to [place] 11/20 with weakness, dizziness, fevers. Symptoms began evening prior to presentation. In [facility], Temp 100.7, HR 106, RR 36 and pt hypotensive SBP 70s. Started on NE via CVC.   11/21 overnight patient with NSVT. [facility] team spoke with cardiology who indicated patient will need cath on Monday; and transfer order placed to [facility]. While at [facility], patient developed chest pain. Cardiology advised for patient to be bolused 600mg  plavix.   Past Medical History  Myasthenia gravis HTN Prostate Cancer HLD RAS [place] Events   11/20 admitted to [facility] with concern for sepsis 11/21 chest pain overnight, transferring to [place]   Consults:  PCCM Cardiology  Procedures:  11/20 CVC >>>  Significant Diagnostic Tests:  11/20 ECHO> LVEF 60-65%. LV with severely decreased function. Mild LVH. Gradual diastolic dysfunction.  CT c/a/p> pericholecystic edema, L renal calculi.   Micro Data:  11/20 BC >> 11/20 COVID19> neg  Antimicrobials:  mero  vanc  Interim history/subjective:  Arrives to [facility] ICU Is on NRB, too anxious for BiPAP Labs are pending and patient is anxious appearing   Objective   Blood pressure 106/76, pulse 81, temperature (!) 97.4 F (36.3 C), temperature source Oral, resp. rate 18, height 5\' 8"  (1.727 m), weight 83.6 kg, SpO2 91 %.        Intake/Output Summary (Last 24 hours) at 01/16/2019 1014 Last data filed at 01/16/2019  0415 Gross per 24 hour  Intake 5710.26 ml  Output 300 ml  Net 5410.26 ml   Filed Weights   01/14/2019 1235 01/16/19 0400  Weight: 87.1 kg 83.6 kg    Examination: General: Chronically ill appearing elderly male, reclined in bed anxious appearing  HENT: NCAT. Pink mmm trachea midline  Lungs: Bibasilar crackles. Symmetrical chest expansion, no accessory muscle use Cardiovascular: regular rate. s1s2 no rgm. Cap refill < 3 seconds  Abdomen: Soft flat ndnt Extremities: Symmetrical bulk and tone. No clubbing or cyanosis Neuro: AAO, following commands, appropriate for age and situation GU: defer  Resolved Hospital Problem list     Assessment & Plan:   Acute Respiratory insufficiency -SOB, mild hypoxia, pulm edema P -Lasix. Careful attention to BP and electrolytes as below -Supportive O2 for SpO2 > 92% -Is full code, would want intubation if indicated   Shock -sepsis in setting of recent e coli bacteremia, possible cholecystitis -Leukocytosis -- patient on chronic steroids.  -consider cardiogenic component as well  P -continue abx -follow micro data -NE for MAP goal > 65 -RUQ [place] -Ok to continue stress dose steroids (if dc, add back home dose steroids) -cardiogenic components as belw  Troponinemia > 26000 -Cards engaged at [facility] but do not see formal consult; did not meet criteria for STEMI, chart eludes to plan for Monday cath -ECG with 1 degree AV block,  -s/p plavix bolus now daily plavix Diastolic heart failure  Non-sustained VT P -cardiology will see, we appreciate their insight and  assistance -goal mag 2 K 4 -check ionized calcium -plavix, heparin gtt  -check BNP -PRN ECG  AKI Hx RAS P -hold lisinopril -trend renal indices and electrolytes   Myasthenia Gravis  -chronic steroids, mestinon P -continue mestinon. Is on stress-dose steroids in setting of hypotension so home dose pred is held   [employer] practice:  Diet: PO Pain/Anxiety/Delirium protocol (if  indicated): na VAP protocol (if indicated): na DVT prophylaxis: heparin gtt GI prophylaxis: na Glucose control: monitor Mobility: BR Code Status: Full Family Communication: Patient and wife at bedside Disposition: ICU  Labs   CBC: Recent Labs  Lab 01/23/2019 1207 01/16/19 0230 01/16/19 0344  WBC 37.8* 49.9* 47.9*  NEUTROABS 35.2*  --   --   HGB 12.7* 10.6* 11.3*  HCT 39.8 33.9* 36.4*  MCV 92.3 96.0 96.0  PLT 210 214 [id number]    Basic Metabolic Panel: Recent Labs  Lab 01/22/2019 1207 01/16/19 0230  NA 140 138  K 3.8 4.2  CL 105 114*  CO2 22 18*  GLUCOSE 92 131*  BUN 25* 27*  CREATININE 1.65* 1.88*  CALCIUM 9.1 7.1*  MG  --  1.5*   GFR: Estimated Creatinine Clearance: 32.5 mL/min (A) (by C-G formula based on SCr of 1.88 mg/dL (H)). Recent Labs  Lab 01/18/2019 1207 12/28/2018 1830 01/16/19 0230 01/16/19 0344  WBC 37.8*  --  49.9* 47.9*  LATICACIDVEN 3.7* 2.9*  --   --     Liver Function Tests: Recent Labs  Lab 01/14/2019 1207 01/16/19 0230  AST 52* 384*  ALT 17 73*  ALKPHOS 61 53  BILITOT 0.9 0.8  PROT 6.2* 5.3*  ALBUMIN 3.4* 2.7*   No results for input(s): LIPASE, AMYLASE in the last 168 hours. No results for input(s): AMMONIA in the last 168 hours.  ABG    Component Value Date/Time   TCO2 24 11/10/2014 1627     Coagulation Profile: Recent Labs  Lab 01/19/2019 1207 01/16/19 0230  INR 1.2 1.5*    Cardiac Enzymes: No results for input(s): CKTOTAL, CKMB, CKMBINDEX, TROPONINI in the last 168 hours.  HbA1C: HB A1C (BAYER DCA - WAIVED)  Date/Time Value Ref Range Status  01/27/2018 09:11 AM 5.6 <7.0 % Final    Comment:                                          Diabetic Adult            <7.0                                       Healthy Adult        4.3 - 5.7                                                           (DCCT/NGSP) American Diabetes Association's Summary of Glycemic Recommendations for Adults with Diabetes: Hemoglobin A1c <7.0%. More  stringent glycemic goals (A1c <6.0%) may further reduce complications at the cost of increased risk of hypoglycemia.   04/02/2017 08:10 AM 5.5 <7.0 % Final    Comment:  Diabetic Adult            <7.0                                       Healthy Adult        4.3 - 5.7                                                           (DCCT/NGSP) American Diabetes Association's Summary of Glycemic Recommendations for Adults with Diabetes: Hemoglobin A1c <7.0%. More stringent glycemic goals (A1c <6.0%) may further reduce complications at the cost of increased risk of hypoglycemia.     CBG: No results for input(s): GLUCAP in the last 168 hours.  Review of Systems:   Endorses fever, chills, weakness, nausea. Endorses chest pain, denies palpitations, endorses SOB denies cough denies URI sx   Past Medical History  He,  has a past medical history of Arthritis, Cancer ([place]), Dysphagia (11/10/2014), Dysphonia (11/11/2014), Foot drop, left, HNP (herniated nucleus pulposus with myelopathy), thoracic, Hyperlipidemia, Hypertension, Myasthenia gravis ([place]) (11/12/2014), Pneumonia, Urgency of urination, and Urinary leakage.   Surgical History    Past Surgical History:  Procedure Laterality Date  . BACK SURGERY     X 3  . CYSTOSCOPY WITH URETHRAL DILATATION N/A 05/10/2014   Procedure: CYSTOSCOPY WITH URETHRAL DILATATION;  Surgeon: Alexis Frock, MD;  Location: [facility];  Service: Urology;  Laterality: N/A;  with catheter placement, prior to surgery. procedure performed on the stretcher.  Marland Kitchen PROSTATE SURGERY    . SKIN CANCER EXCISION    . TONSILLECTOMY    . TOTAL HIP ARTHROPLASTY Right 05/10/2014   Procedure: RIGHT TOTAL HIP ARTHROPLASTY ANTERIOR APPROACH ;  Surgeon: Paralee Cancel, MD;  Location: [facility];  Service: Orthopedics;  Laterality: Right;     Social History   reports that he quit smoking about 24 years ago. He has never used smokeless tobacco. He reports that he  does not drink alcohol or use drugs.   Family History   His family history includes Diabetes in an other family member; Hypertension in an other family member.   Allergies No Known Allergies   Home Medications  Prior to Admission medications   Medication Sig Start Date End Date Taking? Authorizing Provider  amoxicillin-clavulanate (AUGMENTIN) 875-125 MG tablet Take 1 tablet by mouth 2 (two) times daily. 12/24/18  Yes [provider]  aspirin EC 81 MG tablet Take 81 mg by mouth daily.   Yes [provider]  cholecalciferol (VITAMIN D) 1000 UNITS tablet Take 1,000 Units by mouth daily.   Yes [provider]  lisinopril (ZESTRIL) 5 MG tablet TAKE 1 TABLET BY MOUTH EVERY DAY 11/12/18  Yes Ronnie Doss M, DO  Multiple Vitamin (MULTIVITAMIN WITH MINERALS) TABS tablet Take 1 tablet by mouth daily.   Yes [provider]  Omega-3 Fatty Acids (FISH OIL) 1200 MG CAPS Take 1-2 capsules (1,200-2,400 mg total) by mouth 2 (two) times daily. RESTART IN 1 WEEK IF YOUR SWALLOWING CONTINUES TO IMPROVE. Take 2 capsules in the morning and 1 capsule in the evening 11/16/14  Yes Rexene Alberts, MD  pravastatin (PRAVACHOL) 80 MG tablet Take 1 tablet (80 mg total) by mouth every evening. 12/28/18  Yes Ronnie Doss M, DO  predniSONE (DELTASONE) 10 MG tablet Take 10 mg by mouth daily. 04/07/17  Yes [provider]  pyridostigmine (MESTINON) 60 MG tablet Take 60 mg by mouth 3 (three) times daily.    Yes [provider]     Critical care time: 40 minutes       Eliseo Gum MSN, AGACNP-BC [place] [id number] If no answer, [id number] 01/16/2019, 10:14 AM  Attending Note:  83 year old male with MG on prednisone chronically.  Overnight, continues to complain of chest pain and cardiology has been consulted.  On exam, alert and interactive, hypoxemic with diffuse crackles.  I reviewed CXR myself, acute pulmonary edema noted.   Discussed with PCCM-NP.  Will continue active diureses.  KVO IVF.  Cardiology consult called.  Stress dose steroids ordered but if to be d/ced then will need to start back on home dose of prednisone.  Heparin IV until seen by cards, will defer plan to them.  NPO for now but if no cath then will start diet.  PCCM will admit.  Will start BiPAP pending respiratory effort, if continues to breath this way and not respond to diuretics will likely require intubation.  The patient is critically ill with multiple organ systems failure and requires high complexity decision making for assessment and support, frequent evaluation and titration of therapies, application of advanced monitoring technologies and extensive interpretation of multiple databases.   Critical Care Time devoted to patient care services described in this note is  40  Minutes. This time reflects time of care of this signee Dr Jennet Maduro. This critical care time does not reflect procedure time, or teaching time or supervisory time of PA/NP/Med student/Med Resident etc but could involve care discussion time.  Rush Farmer, M.D. [facility] Pulmonary/Critical Care Medicine.

## 2019-01-16 NOTE — Progress Notes (Signed)
ANTICOAGULATION CONSULT NOTE - Initial Consult  Pharmacy Consult for: heparin dosing  Indication: ACS/STEMI  No Known Allergies  Patient Measurements: Height: 5\' 8"  (172.7 cm) Weight: 192 lb (87.1 kg) IBW/kg (Calculated) : 68.4 Heparin Dosing Weight: HEPARIN DW (KG): 86  Vital Signs: Temp: 97.9 F (36.6 C) (11/21 0009) Temp Source: Oral (11/21 0009) BP: 117/64 (11/21 0230) Pulse Rate: 89 (11/21 0230)  Labs: Recent Labs    01/11/2019 1207 01/16/19 0230  HGB 12.7* 10.6*  HCT 39.8 33.9*  PLT 210 214  APTT 29  --   LABPROT 14.8 18.4*  INR 1.2 1.5*  CREATININE 1.65* 1.88*  TROPONINIHS  --  >27,858.0*    Estimated Creatinine Clearance: 33.1 mL/min (A) (by C-G formula based on SCr of 1.88 mg/dL (H)).  Medical History: Past Medical History:  Diagnosis Date  . Arthritis   . Cancer [facility])    History of skin cancer / hx Prostate cancer  . Dysphagia 11/10/2014  . Dysphonia 11/11/2014  . Foot drop, left    DUE TO NERVE INJURY IN BACK PER PT  . HNP (herniated nucleus pulposus with myelopathy), thoracic    HISTORY OF HNP  . Hyperlipidemia   . Hypertension   . Myasthenia gravis ([place]) 11/12/2014   versus Guillain-Barr syndrome.  . Pneumonia    JAN 2016  . Urgency of urination   . Urinary leakage    Assessment: Pharmacy consulted to dose heparin infusion for this 83 yo male with elevated HS Troponin. He wasn't on  anti-coagulantion prior to admission, but has been on heparin sub-q for prophylaxis.  Goal of Therapy:  Heparin level 0.3-0.7 units/ml Monitor platelets by anticoagulation protocol: Yes   Plan:  Give 2000 units bolus x 1 Start heparin infusion at 1000 units/hr Check anti-Xa level in 6-8 hours and daily while on heparin Continue to monitor H&H and platelets  Despina Pole 01/16/2019,3:29 AM

## 2019-01-16 NOTE — Progress Notes (Signed)
ANTICOAGULATION CONSULT NOTE  Pharmacy Consult for: heparin dosing  Indication: ACS/STEMI  No Known Allergies  Patient Measurements: Height: 5\' 8"  (172.7 cm) Weight: 184 lb 4.9 oz (83.6 kg) IBW/kg (Calculated) : 68.4 Heparin Dosing Weight: HEPARIN DW (KG): 86  Vital Signs: Temp: 97.4 F (36.3 C) (11/21 0756) Temp Source: Oral (11/21 0756) BP: 106/76 (11/21 0530) Pulse Rate: 81 (11/21 0530)  Labs: Recent Labs    01/02/2019 1207 01/16/19 0230 01/16/19 0344 01/16/19 0442  HGB 12.7* 10.6* 11.3*  --   HCT 39.8 33.9* 36.4*  --   PLT 210 214 222  --   APTT 29  --   --   --   LABPROT 14.8 18.4*  --   --   INR 1.2 1.5*  --   --   HEPARINUNFRC  --   --  <0.10*  --   CREATININE 1.65* 1.88*  --   --   TROPONINIHS  --  >27,858.0*  --  >26,548.0*    Estimated Creatinine Clearance: 32.5 mL/min (A) (by C-G formula based on SCr of 1.88 mg/dL (H)).  Medical History: Past Medical History:  Diagnosis Date  . Arthritis   . Cancer [facility])    History of skin cancer / hx Prostate cancer  . Dysphagia 11/10/2014  . Dysphonia 11/11/2014  . Foot drop, left    DUE TO NERVE INJURY IN BACK PER PT  . HNP (herniated nucleus pulposus with myelopathy), thoracic    HISTORY OF HNP  . Hyperlipidemia   . Hypertension   . Myasthenia gravis ([place]) 11/12/2014   versus Guillain-Barr syndrome.  . Pneumonia    JAN 2016  . Urgency of urination   . Urinary leakage    Assessment: Pharmacy consulted to dose heparin infusion for this 83 yo male with elevated HS Troponin. He wasn't on anti-coagulantion prior to admission, but has been on heparin sub-q for prophylaxis.  Heparin level drawn from central line this afternoon came back high >1. Heparin also running in central line so infusion was then switched to peripheral IV, level recheck was still elevated >1. Will adjust heparin. No bleeding issues noted.   Goal of Therapy:  Heparin level 0.3-0.7 units/ml Monitor platelets by anticoagulation protocol:  Yes   Plan:  Hold heparin for 30 minutes, restart at 800 units/hr Recheck level tonight  Erin Hearing PharmD., BCPS Clinical Pharmacist 01/16/2019 10:13 AM

## 2019-01-16 NOTE — Progress Notes (Signed)
Troponin resulted at 27,858 and mag 1.5. Notified midlevel who consulted with cardiology regarding possible transfer. Transfer orders are in place to transfer to cardiac telemetry as well as new med orders.

## 2019-01-16 NOTE — Progress Notes (Signed)
Received orders for in and out cath. Peri care performed before. Removed 150 mL orange urine. Peri care performed after. New condom cath placed.

## 2019-01-16 NOTE — Consult Note (Signed)
Cardiology Consultation:   Patient ID: Duane Burns MRN: [id number]; DOB: [birth year]  Admit date: 01/05/2019 Date of Consult: 01/16/2019  Primary Care Provider: [name], DO Primary Cardiologist: Kate Sable, MD  Primary Electrophysiologist:  None    Patient Profile:   Duane Burns is a 83 y.o. male with a hx of aortic stenosis, myasthenia gravis, recent admission with ecoli bacetermia who is being seen today for the evaluation of SOB at the request of Dr Sharma Covert.   History of Present Illness:   Duane Burns 83 yo male history of HTN, mod to sever AS, myasthenia gravis, HL, asymptomatic intermittent 2:1 AV block.   11/2018 admission to [place] with sepsis due to ecoli bacteremia. Imaging around that time was suggestive of cholecystitis, negative [place] and HIDA scan and thus not thought to be etiology. Manged with IV abx. Admission complicated with AKI   Presented to [facility] with fever and hypotension. Temp 100.7 in ER, SBPs in the 70s, WBC 37.8 (WBC 13.6 on 12/24/18) . Bp's remained low despite IVFs. Started on pressors and broad spectrum abx  While at [facility] multiple PACs and PVCs, some reported NSVT. Had some chest pain and diaphoresis.    Admit labs  Lactic acid 3.7 K 3.8 Cr 1.65 WBC 37.8 Hgb 12.7 Plt 210  COVID neg 12/2018 echo Trop >27,000 Repeat EKG SR, LBBB    Heart Pathway Score:     Past Medical History:  Diagnosis Date   Arthritis    Cancer ([place])    History of skin cancer / hx Prostate cancer   Dysphagia 11/10/2014   Dysphonia 11/11/2014   Foot drop, left    DUE TO NERVE INJURY IN BACK PER PT   HNP (herniated nucleus pulposus with myelopathy), thoracic    HISTORY OF HNP   Hyperlipidemia    Hypertension    Myasthenia gravis ([place]) 11/12/2014   versus Guillain-Barr syndrome.   Pneumonia    JAN 2016   Urgency of urination    Urinary leakage     Past Surgical History:  Procedure Laterality Date   BACK  SURGERY     X 3   CYSTOSCOPY WITH URETHRAL DILATATION N/A 05/10/2014   Procedure: CYSTOSCOPY WITH URETHRAL DILATATION;  Surgeon: Alexis Frock, MD;  Location: [facility];  Service: Urology;  Laterality: N/A;  with catheter placement, prior to surgery. procedure performed on the stretcher.   PROSTATE SURGERY     SKIN CANCER EXCISION     TONSILLECTOMY     TOTAL HIP ARTHROPLASTY Right 05/10/2014   Procedure: RIGHT TOTAL HIP ARTHROPLASTY ANTERIOR APPROACH ;  Surgeon: Paralee Cancel, MD;  Location: [facility];  Service: Orthopedics;  Laterality: Right;     Inpatient Medications: Scheduled Meds:  aspirin EC  81 mg Oral Daily   Chlorhexidine Gluconate Cloth  6 each Topical Daily   cholecalciferol  1,000 Units Oral Daily   [START ON 01/17/2019] clopidogrel  75 mg Oral Daily   furosemide       furosemide  40 mg Intravenous Q8H   hydrocortisone sod succinate (SOLU-CORTEF) inj  50 mg Intravenous Q8H   metoprolol tartrate  12.5 mg Oral BID   multivitamin with minerals  1 tablet Oral Daily   pyridostigmine  60 mg Oral TID   senna-docusate  2 tablet Oral BID   sodium chloride flush  3 mL Intravenous Q12H   Continuous Infusions:  sodium chloride     sodium chloride 150 mL/hr at 01/16/19 0055   heparin  1,000 Units/hr (01/16/19 1000)   meropenem (MERREM) IV 1 g (01/16/19 0509)   norepinephrine (LEVOPHED) Adult infusion 6 mcg/min (01/16/19 1147)   vancomycin 1,000 mg (01/16/19 1150)   PRN Meds: sodium chloride, acetaminophen **OR** acetaminophen, guaiFENesin, levalbuterol, morphine injection, ondansetron **OR** ondansetron (ZOFRAN) IV, polyethylene glycol, sodium chloride flush, traZODone  Allergies:   No Known Allergies  Social History:   Social History   Socioeconomic History   Marital status: Married    Spouse name: Not on file   Number of children: Not on file   Years of education: Not on file   Highest education level: Not on file  Occupational History   Not on  file  Social Needs   Financial resource strain: Not hard at all   Food insecurity    Worry: Never true    Inability: Never true   Transportation needs    Medical: No    Non-medical: No  Tobacco Use   Smoking status: Former Smoker    Quit date: 05/05/1994    Years since quitting: 24.7   Smokeless tobacco: Never Used  Substance and Sexual Activity   Alcohol use: No   Drug use: No   Sexual activity: Not Currently  Lifestyle   Physical activity    Days per week: 0 days    Minutes per session: 0 min   Stress: Not at all  Relationships   Social connections    Talks on phone: More than three times a week    Gets together: More than three times a week    Attends religious service: More than 4 times per year    Active member of club or organization: Yes    Attends meetings of clubs or organizations: More than 4 times per year    Relationship status: Married   Intimate partner violence    Fear of current or ex partner: No    Emotionally abused: No    Physically abused: No    Forced sexual activity: No  Other Topics Concern   Not on file  Social History Narrative   Not on file    Family History:    Family History  Problem Relation Age of Onset   Diabetes Other    Hypertension Other      ROS:  Please see the history of present illness.  All other ROS reviewed and negative.     Physical Exam/Data:   Vitals:   01/16/19 1030 01/16/19 1045 01/16/19 1100 01/16/19 1115  BP: (!) 117/94  107/83 111/78  Pulse: 90 88 91 82  Resp: (!) 36 (!) 28 (!) 39 (!) 30  Temp:      TempSrc:      SpO2: 100% 100% 100% 98%  Weight:      Height:        Intake/Output Summary (Last 24 hours) at 01/16/2019 1155 Last data filed at 01/16/2019 1100 Gross per 24 hour  Intake 5775.26 ml  Output 300 ml  Net 5475.26 ml   Last 3 Weights 01/16/2019 01/13/2019 11/17/2018  Weight (lbs) 184 lb 4.9 oz 192 lb 192 lb  Weight (kg) 83.6 kg 87.091 kg 87.091 kg     Body mass index is  28.02 kg/m.  General:  Well nourished, well developed, in no acute distress HEENT: normal Lymph: no adenopathy Neck: elevated JVD Endocrine:  No thryomegaly Cardiac:  normal S1, S2; RRR; no murmur  Lungs:  Coarse bilatearlly Abd: soft, nontender, no hepatomegaly  Ext: no edema, cool Musculoskeletal:  No  deformities, BUE and BLE strength normal and equal Skin: warm and dry  Neuro:  CNs 2-12 intact, no focal abnormalities noted Psych:  Normal affect    Laboratory Data:  High Sensitivity Troponin:   Recent Labs  Lab 01/16/19 0230 01/16/19 0442  TROPONINIHS >27,858.0* >26,548.0*     Chemistry Recent Labs  Lab 01/25/2019 1207 01/16/19 0230 01/16/19 1024  NA 140 138 142  K 3.8 4.2 4.8  CL 105 114* 110  CO2 22 18* 18*  GLUCOSE 92 131* 136*  BUN 25* 27* 32*  CREATININE 1.65* 1.88* 2.44*  CALCIUM 9.1 7.1* 7.6*  GFRNONAA 38* 33* 24*  GFRAA 44* 38* 28*  ANIONGAP 13 6 14     Recent Labs  Lab 01/14/2019 1207 01/16/19 0230 01/16/19 1024  PROT 6.2* 5.3* 5.6*  ALBUMIN 3.4* 2.7* 2.9*  AST 52* 384* 544*  ALT 17 73* 183*  ALKPHOS 61 53 65  BILITOT 0.9 0.8 0.8   Hematology Recent Labs  Lab 01/16/19 0230 01/16/19 0344 01/16/19 1024  WBC 49.9* 47.9* 57.0*  RBC 3.53* 3.79* 4.24  HGB 10.6* 11.3* 12.7*  HCT 33.9* 36.4* 40.6  MCV 96.0 96.0 95.8  MCH 30.0 29.8 30.0  MCHC 31.3 31.0 31.3  RDW 14.2 14.1 14.1  PLT 214 222 253   BNP Recent Labs  Lab 01/16/19 1024  BNP 4,082.7*    DDimer No results for input(s): DDIMER in the last 168 hours.   Radiology/Studies:  Dg Abdomen 1 View  Result Date: 01/24/2019 CLINICAL DATA:  Central line placement. EXAM: ABDOMEN - 1 VIEW COMPARISON:  None. FINDINGS: Right femoral vein catheter has been inserted. The tip is in the region of the right iliac vein at the level of the S1 segment of the sacrum. The visualized bowel gas pattern is normal. No acute bone abnormality. IMPRESSION: 1. Central line tip is in the right iliac vein at the  level of the S1 segment of the sacrum. 2. Benign-appearing abdomen. Electronically Signed   By: Lorriane Shire M.D.   On: 12/30/2018 15:02   Ct Chest W Contrast  Result Date: 01/14/2019 CLINICAL DATA:  Fever of unknown origin, sepsis, leukocytosis. EXAM: CT CHEST, ABDOMEN, AND PELVIS WITH CONTRAST TECHNIQUE: Multidetector CT imaging of the chest, abdomen and pelvis was performed following the standard protocol during bolus administration of intravenous contrast. CONTRAST:  33mL OMNIPAQUE IOHEXOL 300 MG/ML  SOLN COMPARISON:  CT abdomen pelvis 12/14/2018 and CT chest 07/22/2018. FINDINGS: CT CHEST FINDINGS Cardiovascular: Atherosclerotic calcification of the aorta, aortic valve and coronary arteries. Heart is enlarged. No pericardial effusion. Mediastinum/Nodes: 1.6 cm low-attenuation left thyroid nodule is unchanged. No pathologically enlarged mediastinal, hilar or axillary lymph nodes. Esophagus is unremarkable. Lungs/Pleura: Fairly diffuse septal thickening. Mild dependent atelectasis bilaterally. No pleural fluid. Debris is seen dependently in the lower trachea and mainstem bronchi. Musculoskeletal: Degenerative changes in the spine. No worrisome lytic or sclerotic lesions. CT ABDOMEN PELVIS FINDINGS Hepatobiliary: Hyperattenuating lesion in the dome of the liver measures 1.2 cm (2/42) and is nonspecific, possibly representing a flash fill hemangioma or perfusion anomaly. Gallbladder appears mildly distended and there is surrounding haziness and stranding, similar to 12/14/2018. No biliary ductal dilatation. Pancreas: Negative. Spleen: Negative. Adrenals/Urinary Tract: Adrenal glands and right kidney are unremarkable. Stones are seen in the left kidney. Probable left renal sinus cysts. Ureters are decompressed. Bladder is low in volume. Stomach/Bowel: Stomach, small bowel, appendix and colon are unremarkable. Vascular/Lymphatic: Atherosclerotic calcification of the aorta without aneurysm. Right-sided femoral  venous line terminates near  the bifurcation of the internal and external iliac veins on the right. No pathologically enlarged lymph nodes. Reproductive: Prostatectomy. Other: Small bilateral inguinal hernias contain fat. Tiny amount of fluid in the right paracolic gutter. Mesenteries and peritoneum are otherwise unremarkable. Musculoskeletal: Right hip arthroplasty. Degenerative changes in the left hip and spine. IMPRESSION: 1. Mild pericholecystic edema, similar to 12/14/2018. A common bile duct stone was questioned at that time. Consider right upper quadrant ultrasound in further evaluation for acute cholecystitis. Otherwise, no findings to explain the patient's fever and leukocytosis. 2. Pulmonary edema. 3. Left renal stones. 4. Aortic atherosclerosis (ICD10-170.0). Coronary artery calcification. Electronically Signed   By: Lorin Picket M.D.   On: 01/12/2019 16:34   Ct Abdomen Pelvis W Contrast  Result Date: 01/01/2019 CLINICAL DATA:  Fever of unknown origin, sepsis, leukocytosis. EXAM: CT CHEST, ABDOMEN, AND PELVIS WITH CONTRAST TECHNIQUE: Multidetector CT imaging of the chest, abdomen and pelvis was performed following the standard protocol during bolus administration of intravenous contrast. CONTRAST:  74mL OMNIPAQUE IOHEXOL 300 MG/ML  SOLN COMPARISON:  CT abdomen pelvis 12/14/2018 and CT chest 07/22/2018. FINDINGS: CT CHEST FINDINGS Cardiovascular: Atherosclerotic calcification of the aorta, aortic valve and coronary arteries. Heart is enlarged. No pericardial effusion. Mediastinum/Nodes: 1.6 cm low-attenuation left thyroid nodule is unchanged. No pathologically enlarged mediastinal, hilar or axillary lymph nodes. Esophagus is unremarkable. Lungs/Pleura: Fairly diffuse septal thickening. Mild dependent atelectasis bilaterally. No pleural fluid. Debris is seen dependently in the lower trachea and mainstem bronchi. Musculoskeletal: Degenerative changes in the spine. No worrisome lytic or sclerotic  lesions. CT ABDOMEN PELVIS FINDINGS Hepatobiliary: Hyperattenuating lesion in the dome of the liver measures 1.2 cm (2/42) and is nonspecific, possibly representing a flash fill hemangioma or perfusion anomaly. Gallbladder appears mildly distended and there is surrounding haziness and stranding, similar to 12/14/2018. No biliary ductal dilatation. Pancreas: Negative. Spleen: Negative. Adrenals/Urinary Tract: Adrenal glands and right kidney are unremarkable. Stones are seen in the left kidney. Probable left renal sinus cysts. Ureters are decompressed. Bladder is low in volume. Stomach/Bowel: Stomach, small bowel, appendix and colon are unremarkable. Vascular/Lymphatic: Atherosclerotic calcification of the aorta without aneurysm. Right-sided femoral venous line terminates near the bifurcation of the internal and external iliac veins on the right. No pathologically enlarged lymph nodes. Reproductive: Prostatectomy. Other: Small bilateral inguinal hernias contain fat. Tiny amount of fluid in the right paracolic gutter. Mesenteries and peritoneum are otherwise unremarkable. Musculoskeletal: Right hip arthroplasty. Degenerative changes in the left hip and spine. IMPRESSION: 1. Mild pericholecystic edema, similar to 12/14/2018. A common bile duct stone was questioned at that time. Consider right upper quadrant ultrasound in further evaluation for acute cholecystitis. Otherwise, no findings to explain the patient's fever and leukocytosis. 2. Pulmonary edema. 3. Left renal stones. 4. Aortic atherosclerosis (ICD10-170.0). Coronary artery calcification. Electronically Signed   By: Lorin Picket M.D.   On: 01/19/2019 16:34   Dg Chest Port 1 View  Result Date: 01/16/2019 CLINICAL DATA:  Wheezing and left-sided chest pain EXAM: PORTABLE CHEST 1 VIEW COMPARISON:  Chest CT from yesterday FINDINGS: Diffuse interstitial opacity with Kerley lines. There is patchy airspace opacity as well. Mild cardiomegaly. Elevated right  diaphragm which is also seen previously. No visible effusion or pneumothorax IMPRESSION: CHF pattern which is worsened. Electronically Signed   By: Monte Fantasia M.D.   On: 01/16/2019 07:15   Dg Chest Port 1 View  Result Date: 01/04/2019 CLINICAL DATA:  Fever. Sudden onset of weakness last night. EXAM: PORTABLE CHEST 1 VIEW COMPARISON:  Radiographs 12/14/2018  and 08/04/2017.  CT 07/22/2018. FINDINGS: 1212 hours. The heart size and mediastinal contours are stable with aortic atherosclerosis. There is stable chronic elevation/eventration of the right hemidiaphragm with associated chronic right basilar atelectasis. The lungs are otherwise clear. There is no pleural effusion or pneumothorax. The bones appear unremarkable. Telemetry leads overlie the chest. IMPRESSION: Stable postoperative chest with chronic right basilar atelectasis. No acute cardiopulmonary process. Electronically Signed   By: Richardean Sale M.D.   On: 12/27/2018 12:42    Assessment and Plan:   1. NSTEMI - severe trop elevation. He does not have ST elevation, does have anteroseptal Qwaves so possibly subacute infarct, lateral TWIs. On repeat EKG this afternoon new LBBB   - echo reviewed. I think his LVEF is actually decreased and likely his anteroseptum and apex are hypokinetic. There is foreshortening in several views, will order an echo with contrast to clarify.   - discussed case with Dr Tamala Julian on call for interventional. Given the patients septic shock, renal failure with possible intrabdominal source, is not a candidate acutely for cath lab at this time. Recs for medical therapy. - current medical therapy ASA 81, plavix 75, hep gtt. Would d/c lopressor with hypotension  2. Septic shock - broad spectrum abx per primary team. Remains on low dose pressures - he has femoral central access, not able to check CVP or COOX - severe leukocytosis well above WBC when discharged from [facility] last month, don't this this is related to  chronic steroids  3. NSVT - noted at [facility]. K 4.2, Mg was 1.5 received 2 g of IV Mg - arrhytmia in setting of clear ischemia - if persistent would start amio  4. Acute systolic HF - I suspect his LVEF is actually down, repeating echo with contrast to clarify - with hypotension and renal failure limited management options - limited uop with IV lasix, redose 80mg  IV.     For questions or updates, please contact [place] Please consult [email or web address] for contact info under     Signed, Carlyle Dolly, MD  01/16/2019 11:55 AM

## 2019-01-16 NOTE — Progress Notes (Signed)
Closed, Pink, Blanchable

## 2019-01-16 NOTE — Progress Notes (Signed)
Patient developed chest pain with his diaphoresis.  I again called cardiology - they advised that he would need to be accepted by ICU at [facility] to be transferred. I called the intensivist at [facility], who reported that cardiology needs to accept this patient. The intensivist reports that there are no beds available and that there haven't been beds available all day. He said that the patient can be put on a waiting list. When I explained that this patient is infarcting his heart - he reported that the patient would need to be accepted by cardiology. He did advise that if cardiology could take the patient to the holding area, and the patient could move to an ICU bed when one becomes available. I called cardiology back to explain this scenario. Dr. Paticia Stack reports that this patient is not having a STEMI so he does not meet criteria to go straight to the cath lab. He reports that the patient needs a higher level of care and that the holding area is not an ICU. I have suggested consulting the intensivist from the holding area, and I have been told that this is not possible. As it stands at this point - patient is on waiting list to go to ICU at [facility]. He has been accepted, and [facility] has "put in for a bed."   Dr. Paticia Stack did advise that patient is not likely a surgical candidate so plavix should be bolused at 600mg  and continued at 75mg  daily. These orders have been placed.

## 2019-01-16 NOTE — Progress Notes (Signed)
Changed neb to xopenex instead of albuterol prn q6 . Patient still having PVC and heart issues. Neb is prn if needed.

## 2019-01-17 ENCOUNTER — Inpatient Hospital Stay (HOSPITAL_COMMUNITY): Payer: Medicare HMO

## 2019-01-17 DIAGNOSIS — G9340 Encephalopathy, unspecified: Secondary | ICD-10-CM

## 2019-01-17 DIAGNOSIS — N170 Acute kidney failure with tubular necrosis: Secondary | ICD-10-CM

## 2019-01-17 DIAGNOSIS — D72829 Elevated white blood cell count, unspecified: Secondary | ICD-10-CM | POA: Diagnosis not present

## 2019-01-17 DIAGNOSIS — K810 Acute cholecystitis: Secondary | ICD-10-CM

## 2019-01-17 DIAGNOSIS — A419 Sepsis, unspecified organism: Secondary | ICD-10-CM

## 2019-01-17 DIAGNOSIS — R6521 Severe sepsis with septic shock: Secondary | ICD-10-CM

## 2019-01-17 LAB — BASIC METABOLIC PANEL
Anion gap: 13 (ref 5–15)
BUN: 56 mg/dL — ABNORMAL HIGH (ref 8–23)
CO2: 16 mmol/L — ABNORMAL LOW (ref 22–32)
Calcium: 7.1 mg/dL — ABNORMAL LOW (ref 8.9–10.3)
Chloride: 111 mmol/L (ref 98–111)
Creatinine, Ser: 3.29 mg/dL — ABNORMAL HIGH (ref 0.61–1.24)
GFR calc Af Amer: 19 mL/min — ABNORMAL LOW (ref >60)
GFR calc non Af Amer: 17 mL/min — ABNORMAL LOW (ref >60)
Glucose, Bld: 161 mg/dL — ABNORMAL HIGH (ref 70–99)
Potassium: 4.3 mmol/L (ref 3.5–5.1)
Sodium: 140 mmol/L (ref 135–145)

## 2019-01-17 LAB — COMPREHENSIVE METABOLIC PANEL
ALT: 159 U/L — ABNORMAL HIGH (ref 0–44)
AST: 410 U/L — ABNORMAL HIGH (ref 15–41)
Albumin: 2.6 g/dL — ABNORMAL LOW (ref 3.5–5.0)
Alkaline Phosphatase: 72 U/L (ref 38–126)
Anion gap: 11 (ref 5–15)
BUN: 44 mg/dL — ABNORMAL HIGH (ref 8–23)
CO2: 18 mmol/L — ABNORMAL LOW (ref 22–32)
Calcium: 7.5 mg/dL — ABNORMAL LOW (ref 8.9–10.3)
Chloride: 110 mmol/L (ref 98–111)
Creatinine, Ser: 2.98 mg/dL — ABNORMAL HIGH (ref 0.61–1.24)
GFR calc Af Amer: 22 mL/min — ABNORMAL LOW (ref >60)
GFR calc non Af Amer: 19 mL/min — ABNORMAL LOW (ref >60)
Glucose, Bld: 132 mg/dL — ABNORMAL HIGH (ref 70–99)
Potassium: 5 mmol/L (ref 3.5–5.1)
Sodium: 139 mmol/L (ref 135–145)
Total Bilirubin: 1 mg/dL (ref 0.3–1.2)
Total Protein: 5.5 g/dL — ABNORMAL LOW (ref 6.5–8.1)

## 2019-01-17 LAB — POCT I-STAT 7, (LYTES, BLD GAS, ICA,H+H)
Acid-base deficit: 9 mmol/L — ABNORMAL HIGH (ref 0.0–2.0)
Acid-base deficit: 10 mmol/L — ABNORMAL HIGH (ref 0.0–2.0)
Bicarbonate: 16 mmol/L — ABNORMAL LOW (ref 20.0–28.0)
Bicarbonate: 16.3 mmol/L — ABNORMAL LOW (ref 20.0–28.0)
Calcium, Ion: 1.04 mmol/L — ABNORMAL LOW (ref 1.15–1.40)
Calcium, Ion: 1.03 mmol/L — ABNORMAL LOW (ref 1.15–1.40)
HCT: 36 % — ABNORMAL LOW (ref 39.0–52.0)
HCT: 34 % — ABNORMAL LOW (ref 39.0–52.0)
Hemoglobin: 12.2 g/dL — ABNORMAL LOW (ref 13.0–17.0)
Hemoglobin: 11.6 g/dL — ABNORMAL LOW (ref 13.0–17.0)
O2 Saturation: 97 %
O2 Saturation: 92 %
Patient temperature: 96.3
Patient temperature: 98
Potassium: 4.9 mmol/L (ref 3.5–5.1)
Potassium: 4.4 mmol/L (ref 3.5–5.1)
Sodium: 139 mmol/L (ref 135–145)
Sodium: 140 mmol/L (ref 135–145)
TCO2: 17 mmol/L — ABNORMAL LOW (ref 22–32)
TCO2: 17 mmol/L — ABNORMAL LOW (ref 22–32)
pCO2 arterial: 30.1 mmHg — ABNORMAL LOW (ref 32.0–48.0)
pCO2 arterial: 34 mmHg (ref 32.0–48.0)
pH, Arterial: 7.326 — ABNORMAL LOW (ref 7.350–7.450)
pH, Arterial: 7.286 — ABNORMAL LOW (ref 7.350–7.450)
pO2, Arterial: 94 mmHg (ref 83.0–108.0)
pO2, Arterial: 70 mmHg — ABNORMAL LOW (ref 83.0–108.0)

## 2019-01-17 LAB — PROCALCITONIN: Procalcitonin: 122.51 ng/mL

## 2019-01-17 LAB — CBC
HCT: 38.4 % — ABNORMAL LOW (ref 39.0–52.0)
Hemoglobin: 12.4 g/dL — ABNORMAL LOW (ref 13.0–17.0)
MCH: 30.1 pg (ref 26.0–34.0)
MCHC: 32.3 g/dL (ref 30.0–36.0)
MCV: 93.2 fL (ref 80.0–100.0)
Platelets: 205 10*3/uL (ref 150–400)
RBC: 4.12 MIL/uL — ABNORMAL LOW (ref 4.22–5.81)
RDW: 14.3 % (ref 11.5–15.5)
WBC: 52.8 10*3/uL (ref 4.0–10.5)
nRBC: 0 % (ref 0.0–0.2)

## 2019-01-17 LAB — MAGNESIUM: Magnesium: 2.2 mg/dL (ref 1.7–2.4)

## 2019-01-17 LAB — LACTIC ACID, PLASMA
Lactic Acid, Venous: 3.5 mmol/L (ref 0.5–1.9)
Lactic Acid, Venous: 2.3 mmol/L (ref 0.5–1.9)

## 2019-01-17 LAB — SODIUM, URINE, RANDOM: Sodium, Ur: 45 mmol/L

## 2019-01-17 LAB — TYPE AND SCREEN
ABO/RH(D): B POS
Antibody Screen: NEGATIVE

## 2019-01-17 LAB — VANCOMYCIN, RANDOM: Vancomycin Rm: 16

## 2019-01-17 LAB — TROPONIN I (HIGH SENSITIVITY): Troponin I (High Sensitivity): >27000 ng/L (ref <18)

## 2019-01-17 LAB — CREATININE, URINE, RANDOM: Creatinine, Urine: 86.31 mg/dL

## 2019-01-17 LAB — PROTIME-INR
INR: 1.4 — ABNORMAL HIGH (ref 0.8–1.2)
Prothrombin Time: 16.9 seconds — ABNORMAL HIGH (ref 11.4–15.2)

## 2019-01-17 LAB — ABO/RH: ABO/RH(D): B POS

## 2019-01-17 LAB — LIPASE, BLOOD: Lipase: 462 U/L — ABNORMAL HIGH (ref 11–51)

## 2019-01-17 LAB — PHOSPHORUS: Phosphorus: 6.4 mg/dL — ABNORMAL HIGH (ref 2.5–4.6)

## 2019-01-17 LAB — HEPARIN LEVEL (UNFRACTIONATED): Heparin Unfractionated: 0.54 IU/mL (ref 0.30–0.70)

## 2019-01-17 LAB — CALCIUM, IONIZED: Calcium, Ionized, Serum: 3.9 mg/dL — ABNORMAL LOW (ref 4.5–5.6)

## 2019-01-17 MED ORDER — MIDAZOLAM HCL 2 MG/2ML IJ SOLN
INTRAMUSCULAR | Status: AC
  Filled 2019-01-17: qty 2

## 2019-01-17 MED ORDER — NOREPINEPHRINE 16 MG/250ML-% IV SOLN
0.0000 ug/min | INTRAVENOUS | Status: DC
  Administered 2019-01-17: 5 ug/min via INTRAVENOUS
  Administered 2019-01-18: 27 ug/min via INTRAVENOUS
  Administered 2019-01-20: 2 ug/min via INTRAVENOUS
  Filled 2019-01-17 (×3): qty 250

## 2019-01-17 MED ORDER — FENTANYL CITRATE (PF) 100 MCG/2ML IJ SOLN
25.0000 ug | INTRAMUSCULAR | Status: DC | PRN
  Administered 2019-01-17: 50 ug via INTRAVENOUS
  Filled 2019-01-17 (×3): qty 2

## 2019-01-17 MED ORDER — FENTANYL 2500MCG IN NS 250ML (10MCG/ML) PREMIX INFUSION
25.0000 ug/h | INTRAVENOUS | Status: DC
  Administered 2019-01-17: 25 ug/h via INTRAVENOUS
  Administered 2019-01-19: 175 ug/h via INTRAVENOUS
  Filled 2019-01-17 (×3): qty 250

## 2019-01-17 MED ORDER — ETOMIDATE 2 MG/ML IV SOLN
20.0000 mg | Freq: Once | INTRAVENOUS | Status: AC
  Administered 2019-01-17: 20 mg via INTRAVENOUS

## 2019-01-17 MED ORDER — CHLORHEXIDINE GLUCONATE 0.12% ORAL RINSE (MEDLINE KIT)
15.0000 mL | Freq: Two times a day (BID) | OROMUCOSAL | Status: DC
  Administered 2019-01-18 – 2019-01-27 (×20): 15 mL via OROMUCOSAL

## 2019-01-17 MED ORDER — ATORVASTATIN CALCIUM 80 MG PO TABS: 80.0000 mg | ORAL_TABLET | Freq: Every day | ORAL | Status: DC

## 2019-01-17 MED ORDER — SENNOSIDES 8.8 MG/5ML PO SYRP: 5.0000 mL | ORAL_SOLUTION | Freq: Two times a day (BID) | ORAL | Status: DC | PRN

## 2019-01-17 MED ORDER — VANCOMYCIN HCL IN DEXTROSE 1-5 GM/200ML-% IV SOLN
1000.0000 mg | INTRAVENOUS | Status: DC
  Administered 2019-01-17 – 2019-01-18 (×2): 1000 mg via INTRAVENOUS
  Filled 2019-01-17 (×2): qty 200

## 2019-01-17 MED ORDER — FENTANYL CITRATE (PF) 100 MCG/2ML IJ SOLN
INTRAMUSCULAR | Status: AC
  Filled 2019-01-17: qty 4

## 2019-01-17 MED ORDER — FUROSEMIDE 10 MG/ML IJ SOLN: 40.0000 mg | Freq: Once | INTRAMUSCULAR | Status: AC

## 2019-01-17 MED ORDER — ORAL CARE MOUTH RINSE: 15.0000 mL | Freq: Two times a day (BID) | OROMUCOSAL | Status: DC

## 2019-01-17 MED ORDER — FENTANYL CITRATE (PF) 100 MCG/2ML IJ SOLN: 25.0000 ug | INTRAMUSCULAR | Status: DC | PRN

## 2019-01-17 MED ORDER — LORAZEPAM 2 MG/ML IJ SOLN
INTRAMUSCULAR | Status: AC
  Filled 2019-01-17: qty 1

## 2019-01-17 MED ORDER — FENTANYL CITRATE (PF) 100 MCG/2ML IJ SOLN: 25.0000 ug | Freq: Once | INTRAMUSCULAR | Status: DC

## 2019-01-17 MED ORDER — CHLORHEXIDINE GLUCONATE 0.12 % MT SOLN
15.0000 mL | Freq: Two times a day (BID) | OROMUCOSAL | Status: DC
  Administered 2019-01-17: 15 mL via OROMUCOSAL
  Filled 2019-01-17: qty 15

## 2019-01-17 MED ORDER — FUROSEMIDE 10 MG/ML IJ SOLN
INTRAMUSCULAR | Status: AC
  Administered 2019-01-17: 40 mg
  Filled 2019-01-17: qty 4

## 2019-01-17 MED ORDER — BISACODYL 10 MG RE SUPP: 10.0000 mg | Freq: Every day | RECTAL | Status: DC | PRN

## 2019-01-17 MED ORDER — MIDAZOLAM HCL 2 MG/2ML IJ SOLN
2.0000 mg | Freq: Once | INTRAMUSCULAR | Status: AC
  Administered 2019-01-17: 2 mg via INTRAVENOUS

## 2019-01-17 MED ORDER — FENTANYL CITRATE (PF) 100 MCG/2ML IJ SOLN
100.0000 ug | Freq: Once | INTRAMUSCULAR | Status: AC
  Administered 2019-01-17: 100 ug via INTRAVENOUS

## 2019-01-17 MED ORDER — DEXMEDETOMIDINE HCL IN NACL 400 MCG/100ML IV SOLN
0.0000 ug/kg/h | INTRAVENOUS | Status: DC
  Administered 2019-01-17: 0.4 ug/kg/h via INTRAVENOUS
  Filled 2019-01-17: qty 100

## 2019-01-17 MED ORDER — LORAZEPAM 2 MG/ML IJ SOLN: 1.0000 mg | Freq: Once | INTRAMUSCULAR | Status: DC

## 2019-01-17 MED ORDER — ROCURONIUM BROMIDE 50 MG/5ML IV SOLN
100.0000 mg | Freq: Once | INTRAVENOUS | Status: AC
  Administered 2019-01-17: 60 mg via INTRAVENOUS
  Filled 2019-01-17: qty 10

## 2019-01-17 MED ORDER — FENTANYL BOLUS VIA INFUSION
25.0000 ug | INTRAVENOUS | Status: DC | PRN
  Administered 2019-01-17 – 2019-01-18 (×4): 25 ug via INTRAVENOUS
  Filled 2019-01-17: qty 25

## 2019-01-17 MED ORDER — MIDAZOLAM HCL 2 MG/2ML IJ SOLN
1.0000 mg | INTRAMUSCULAR | Status: DC | PRN
  Administered 2019-01-17 – 2019-01-19 (×8): 1 mg via INTRAVENOUS
  Filled 2019-01-17 (×7): qty 2

## 2019-01-17 MED ORDER — VANCOMYCIN VARIABLE DOSE PER UNSTABLE RENAL FUNCTION (PHARMACIST DOSING): Status: DC

## 2019-01-17 MED ORDER — ORAL CARE MOUTH RINSE
15.0000 mL | OROMUCOSAL | Status: DC
  Administered 2019-01-17 – 2019-01-23 (×43): 15 mL via OROMUCOSAL

## 2019-01-17 MED ORDER — FUROSEMIDE 10 MG/ML IJ SOLN
120.0000 mg | Freq: Once | INTRAVENOUS | Status: AC
  Administered 2019-01-17: 120 mg via INTRAVENOUS
  Filled 2019-01-17: qty 10

## 2019-01-17 MED ORDER — FAMOTIDINE IN NACL 20-0.9 MG/50ML-% IV SOLN
20.0000 mg | Freq: Every day | INTRAVENOUS | Status: DC
  Administered 2019-01-17 – 2019-01-20 (×4): 20 mg via INTRAVENOUS
  Filled 2019-01-17 (×4): qty 50

## 2019-01-17 NOTE — Progress Notes (Signed)
eLink Physician-Brief Progress Note Patient Name: OLUWAJOMILOJU DELLAPORTA DOB: [birth year] MRN: [id number]   Date of Service  01/17/2019  HPI/Events of Note  Severe agitation. Notified that patient was intubated earlier and started on precedex. I am told precedex dropped his HR and BP and was turned off. Patient now severely agitated. Seen on camera, thrashing around. RN also noted he went into A fib. Current HR is in 80s/90s. BP has improved.   eICU Interventions  Fentanyl infusion and PRNs ordered. Also wrote for PRN versed if needed in addition to fentanyl. Patient calmed down after a bolus of fentanyl. HR in 80s. Already on heparin infusion, levophed and getting antibiotics.   Post intubation ABG is pending. Asked RN to obtain when patient remains calm and let us know in case changes needed to vent.      Intervention Category Major Interventions: Delirium, psychosis, severe agitation - evaluation and management;Other:  Margaretmary Lombard 01/17/2019, 7:40 PM

## 2019-01-17 NOTE — Progress Notes (Signed)
ANTICOAGULATION CONSULT NOTE  Pharmacy Consult for:  Heparin Indication: ACS/STEMI  No Known Allergies  Patient Measurements: Height: 5\' 8"  (172.7 cm) Weight: 184 lb 4.9 oz (83.6 kg) IBW/kg (Calculated) : 68.4 HEPARIN DW (KG): 86 kg  Vital Signs: Temp: 97.4 F (36.3 C) (11/22 0718) Temp Source: Axillary (11/22 0000) BP: 118/74 (11/22 0715) Pulse Rate: 35 (11/22 0715)  Labs: Recent Labs    01/11/2019 1207 01/16/19 0230  01/16/19 0344 01/16/19 0442 01/16/19 1024 01/16/19 1257 01/16/19 2145 01/17/19 0207 01/17/19 0229  HGB 12.7* 10.6*  --  11.3*  --  12.7*  --   --  12.2* 12.4*  HCT 39.8 33.9*  --  36.4*  --  40.6  --   --  36.0* 38.4*  PLT 210 214  --  222  --  253  --   --   --  205  APTT 29  --   --   --   --   --   --   --   --   --   LABPROT 14.8 18.4*  --   --   --   --   --   --   --   --   INR 1.2 1.5*  --   --   --   --   --   --   --   --   HEPARINUNFRC  --   --    < > <0.10*  --  1.09* 1.08* 0.58  --  0.54  CREATININE 1.65* 1.88*  --   --   --  2.44*  --   --   --  2.98*  TROPONINIHS  --  >27,858.0*  --   --  >26,548.0*  --   --   --   --  >27,000*   < > = values in this interval not displayed.    Estimated Creatinine Clearance: 20.5 mL/min (A) (by C-G formula based on SCr of 2.98 mg/dL (H)).   Assessment: 83 yo male with elevated troponin to continue on IV heparin.   Heparin level continues to be therapeutic. No bleeding reported, cbc stable.   Goal of Therapy:  Heparin level 0.3-0.7 units/ml Monitor platelets by anticoagulation protocol: Yes   Plan:  Continue heparin gtt at 800 units/hr F/U AM labs  Erin Hearing PharmD., BCPS Clinical Pharmacist 01/17/2019 8:00 AM

## 2019-01-17 NOTE — Progress Notes (Addendum)
PCCM Progress:  Called to the bedside to evaluate patient after he became acutely hypoxic and tachypneic.  Bipap was initiated, ABG  7.32/30/94/16.   On evaluation pt complained of chest pain.  Recent dx of NSTEMI and cardiology is following, not a candidate right now for cardiac cath given his acute sepsis. Plan is for medical management and echo.     CXR: worsening pulmonary edema   P: -Pt given Morphine 2mg  with improvement, tolerating Bipap  -check troponin, EKG if significantly worsening notify cards -Lasix 120mg  IV -RUQ [place] suspicious for acute cholecystitis, continue vanc/meropenem, likely IR consult during in the day, follow lactic acid

## 2019-01-17 NOTE — Progress Notes (Signed)
CRITICAL VALUE ALERT  Critical Value:  Troponin > 27,000  Date & Time Notied:  01/17/19 0400  Provider Notified: elink  Orders Received/Actions taken: no new orders

## 2019-01-17 NOTE — Progress Notes (Signed)
Pt refusing Bipap at this time. MD called to bedside and spoke with pt about the importance of wearing Bipap due to increased WOB. Pt agreed to wear HFNC & breathing tx at this time. If pt does not improve pt agreed to Bipap. RT will continue to monitor

## 2019-01-17 NOTE — Consult Note (Addendum)
Reason for Consult: Acute cholecysititis Referring Physician: Dr. Hannah Beat is an 83 y.o. male.  HPI: Patient is an 83 year old male with a past medical history significant for myasthenia gravis, hypertension, prostate cancer, hyperlipidemia, recently admitted for NSTEMI, with a recent history of epigastric and right upper quadrant abdominal pain. Patient was recently admitted to an outside hospital and treated for E. coli bacteremia.  Subsequent after discharge patient was seen at [facility] developed chest pain as well as several arrhythmias.  Patient was found to have an NSTEMI.  Patient was transferred to [facility] and underwent evaluation by cardiology.  Secondary to the patient's renal failure and sepsis patient was deemed not a candidate for Cath Lab.  In discussion with the patient he states that he had pain prior to admission to [facility].  He states he was mainly epigastric/right upper and left upper quadrant.  Patient has been on liquid diet without any significant pain.  Patient underwent ultrasound which revealed thickened gallbladder wall 8 mm.  Patient also with stones on ultrasound.  Patient did have elevated transaminases.  Leukocytosis.  Patient currently on heparin drip, Levophed, and Plavix  Past Medical History:  Diagnosis Date  . Arthritis   . Cancer [facility])    History of skin cancer / hx Prostate cancer  . Dysphagia 11/10/2014  . Dysphonia 11/11/2014  . Foot drop, left    DUE TO NERVE INJURY IN BACK PER PT  . HNP (herniated nucleus pulposus with myelopathy), thoracic    HISTORY OF HNP  . Hyperlipidemia   . Hypertension   . Myasthenia gravis ([place]) 11/12/2014   versus Guillain-Barr syndrome.  . Pneumonia    JAN 2016  . Urgency of urination   . Urinary leakage     Past Surgical History:  Procedure Laterality Date  . BACK SURGERY     X 3  . CYSTOSCOPY WITH URETHRAL DILATATION N/A 05/10/2014   Procedure: CYSTOSCOPY WITH URETHRAL  DILATATION;  Surgeon: Alexis Frock, MD;  Location: [facility];  Service: Urology;  Laterality: N/A;  with catheter placement, prior to surgery. procedure performed on the stretcher.  Marland Kitchen PROSTATE SURGERY    . SKIN CANCER EXCISION    . TONSILLECTOMY    . TOTAL HIP ARTHROPLASTY Right 05/10/2014   Procedure: RIGHT TOTAL HIP ARTHROPLASTY ANTERIOR APPROACH ;  Surgeon: Paralee Cancel, MD;  Location: [facility];  Service: Orthopedics;  Laterality: Right;    Family History  Problem Relation Age of Onset  . Diabetes Other   . Hypertension Other     Social History:  reports that he quit smoking about 24 years ago. He has never used smokeless tobacco. He reports that he does not drink alcohol or use drugs.  Allergies: No Known Allergies  Medications: I have reviewed the patient's current medications.  Results for orders placed or performed during the hospital encounter of 01/23/2019 (from the past 48 hour(s))  Lactic acid, plasma     Status: Abnormal   Collection Time: 01/04/2019 12:07 PM  Result Value Ref Range   Lactic Acid, Venous 3.7 (HH) 0.5 - 1.9 mmol/L    Comment: CRITICAL RESULT CALLED TO, READ BACK BY AND VERIFIED WITH: OAKLEY B. AT 1235 ON 01/14/2019 BY THOMPSON S. Performed at [facility], [address] [place], [place] [address]   Comprehensive metabolic panel     Status: Abnormal   Collection Time: 01/05/2019 12:07 PM  Result Value Ref Range   Sodium 140 135 - 145  mmol/L   Potassium 3.8 3.5 - 5.1 mmol/L   Chloride 105 98 - 111 mmol/L   CO2 22 22 - 32 mmol/L   Glucose, Bld 92 70 - 99 mg/dL   BUN 25 (H) 8 - 23 mg/dL   Creatinine, Ser 1.65 (H) 0.61 - 1.24 mg/dL   Calcium 9.1 8.9 - 10.3 mg/dL   Total Protein 6.2 (L) 6.5 - 8.1 g/dL   Albumin 3.4 (L) 3.5 - 5.0 g/dL   AST 52 (H) 15 - 41 U/L   ALT 17 0 - 44 U/L   Alkaline Phosphatase 61 38 - 126 U/L   Total Bilirubin 0.9 0.3 - 1.2 mg/dL   GFR calc non Af Amer 38 (L) >60 mL/min   GFR calc Af Amer 44 (L) >60 mL/min   Anion gap 13 5 - 15     Comment: Performed at [facility], [address] [place], [place] [address]  CBC WITH DIFFERENTIAL     Status: Abnormal   Collection Time: 01/24/2019 12:07 PM  Result Value Ref Range   WBC 37.8 (H) 4.0 - 10.5 K/uL   RBC 4.31 4.22 - 5.81 MIL/uL   Hemoglobin 12.7 (L) 13.0 - 17.0 g/dL   HCT 39.8 39.0 - 52.0 %   MCV 92.3 80.0 - 100.0 fL   MCH 29.5 26.0 - 34.0 pg   MCHC 31.9 30.0 - 36.0 g/dL   RDW 13.5 11.5 - 15.5 %   Platelets 210 150 - 400 K/uL   nRBC 0.0 0.0 - 0.2 %   Neutrophils Relative % 93 %   Neutro Abs 35.2 (H) 1.7 - 7.7 K/uL   Lymphocytes Relative 2 %   Lymphs Abs 0.7 0.7 - 4.0 K/uL   Monocytes Relative 2 %   Monocytes Absolute 0.9 0.1 - 1.0 K/uL   Eosinophils Relative 1 %   Eosinophils Absolute 0.3 0.0 - 0.5 K/uL   Basophils Relative 0 %   Basophils Absolute 0.1 0.0 - 0.1 K/uL   WBC Morphology VACUOLATED NEUTROPHILS     Comment: WHITE CELL COUNT CONFIRMED BY SMEAR   FEW BANDS SEEN   Immature Granulocytes 2 %   Abs Immature Granulocytes 0.65 (H) 0.00 - 0.07 K/uL    Comment: Performed at [facility], [address], [place], [place] [address]  APTT     Status: None   Collection Time: 12/31/2018 12:07 PM  Result Value Ref Range   aPTT 29 24 - 36 seconds    Comment: Performed at [facility], [address], [place], [place] [address]  Protime-INR     Status: None   Collection Time: 01/05/2019 12:07 PM  Result Value Ref Range   Prothrombin Time 14.8 11.4 - 15.2 seconds   INR 1.2 0.8 - 1.2    Comment: (NOTE) INR goal varies based on device and disease states. Performed at [facility], [address] [place], [place] [address]   Blood Culture (routine x 2)     Status: None (Preliminary result)   Collection Time: 01/12/2019 12:07 PM   Specimen: BLOOD RIGHT FOREARM  Result Value Ref Range   Specimen Description BLOOD RIGHT FOREARM DRAWN BY RN    Special Requests      BOTTLES DRAWN AEROBIC AND ANAEROBIC Blood Culture adequate volume   Culture      NO GROWTH < 24  HOURS Performed at [facility], [address] [place], [place] [address]    Report Status PENDING   Blood Culture (routine x 2)  Status: None (Preliminary result)   Collection Time: 01/18/2019 12:17 PM   Specimen: BLOOD LEFT HAND  Result Value Ref Range   Specimen Description BLOOD LEFT HAND    Special Requests      BOTTLES DRAWN AEROBIC AND ANAEROBIC Blood Culture results may not be optimal due to an inadequate volume of blood received in culture bottles   Culture      NO GROWTH < 24 HOURS Performed at [facility], [address] [place], [place] [address]    Report Status PENDING   POC SARS Coronavirus 2 Ag-ED - Nasal Swab (BD Veritor Kit)     Status: None   Collection Time: 01/23/2019 12:43 PM  Result Value Ref Range   SARS Coronavirus 2 Ag NEGATIVE NEGATIVE    Comment: (NOTE) SARS-CoV-2 antigen NOT DETECTED.  Negative results are presumptive.  Negative results do not preclude SARS-CoV-2 infection and should not be used as the sole basis for treatment or other patient management decisions, including infection  control decisions, particularly in the presence of clinical signs and  symptoms consistent with COVID-19, or in those who have been in contact with the virus.  Negative results must be combined with clinical observations, patient history, and epidemiological information. The expected result is Negative. Fact Sheet for Patients: PodPark.tn Fact Sheet for Healthcare Providers: GiftContent.is This test is not yet approved or cleared by the [place] FDA and  has been authorized for detection and/or diagnosis of SARS-CoV-2 by FDA under an Emergency Use Authorization (EUA).  This EUA will remain in effect (meaning this test can be used) for the duration of  the COVID-19 de claration under Section 564(b)(1) of the Act, 21 U.S.C. section 360bbb-3(b)(1), unless the authorization is terminated or revoked sooner.    SARS Coronavirus 2 by RT PCR (hospital order, performed in [facility] hospital lab) Nasopharyngeal Nasopharyngeal Swab     Status: None   Collection Time: 01/09/2019  1:05 PM   Specimen: Nasopharyngeal Swab  Result Value Ref Range   SARS Coronavirus 2 NEGATIVE NEGATIVE    Comment: (NOTE) If result is NEGATIVE SARS-CoV-2 target nucleic acids are NOT DETECTED. The SARS-CoV-2 RNA is generally detectable in upper and lower  respiratory specimens during the acute phase of infection. The lowest  concentration of SARS-CoV-2 viral copies this assay can detect is 250  copies / mL. A negative result does not preclude SARS-CoV-2 infection  and should not be used as the sole basis for treatment or other  patient management decisions.  A negative result may occur with  improper specimen collection / handling, submission of specimen other  than nasopharyngeal swab, presence of viral mutation(s) within the  areas targeted by this assay, and inadequate number of viral copies  (<250 copies / mL). A negative result must be combined with clinical  observations, patient history, and epidemiological information. If result is POSITIVE SARS-CoV-2 target nucleic acids are DETECTED. The SARS-CoV-2 RNA is generally detectable in upper and lower  respiratory specimens dur ing the acute phase of infection.  Positive  results are indicative of active infection with SARS-CoV-2.  Clinical  correlation with patient history and other diagnostic information is  necessary to determine patient infection status.  Positive results do  not rule out bacterial infection or co-infection with other viruses. If result is PRESUMPTIVE POSTIVE SARS-CoV-2 nucleic acids MAY BE PRESENT.   A presumptive positive result was obtained on the submitted specimen  and confirmed on repeat testing.  While 2019 novel coronavirus  (SARS-CoV-2)  nucleic acids may be present in the submitted sample  additional confirmatory testing may be  necessary for epidemiological  and / or clinical management purposes  to differentiate between  SARS-CoV-2 and other Sarbecovirus currently known to infect humans.  If clinically indicated additional testing with an alternate test  methodology [phone]) is advised. The SARS-CoV-2 RNA is generally  detectable in upper and lower respiratory sp ecimens during the acute  phase of infection. The expected result is Negative. Fact Sheet for Patients:  StrictlyIdeas.no Fact Sheet for Healthcare Providers: BankingDealers.co.za This test is not yet approved or cleared by the [place] FDA and has been authorized for detection and/or diagnosis of SARS-CoV-2 by FDA under an Emergency Use Authorization (EUA).  This EUA will remain in effect (meaning this test can be used) for the duration of the COVID-19 declaration under Section 564(b)(1) of the Act, 21 U.S.C. section 360bbb-3(b)(1), unless the authorization is terminated or revoked sooner. Performed at [facility], [address], [place], [place] [address]   MRSA PCR Screening     Status: None   Collection Time: 01/18/2019  4:35 PM   Specimen: Nasal Mucosa; Nasopharyngeal  Result Value Ref Range   MRSA by PCR NEGATIVE NEGATIVE    Comment:        The GeneXpert MRSA Assay (FDA approved for NASAL specimens only), is one component of a comprehensive MRSA colonization surveillance program. It is not intended to diagnose MRSA infection nor to guide or monitor treatment for MRSA infections. Performed at [facility], [address], [place], [place] [address]   Lactic acid, plasma     Status: Abnormal   Collection Time: 01/05/2019  6:30 PM  Result Value Ref Range   Lactic Acid, Venous 2.9 (HH) 0.5 - 1.9 mmol/L    Comment: CRITICAL VALUE NOTED.  VALUE IS CONSISTENT WITH PREVIOUSLY REPORTED AND CALLED VALUE. Performed at [facility], [address] [place], [place] [address]   CBC     Status:  Abnormal   Collection Time: 01/16/19  2:30 AM  Result Value Ref Range   WBC 49.9 (H) 4.0 - 10.5 K/uL   RBC 3.53 (L) 4.22 - 5.81 MIL/uL   Hemoglobin 10.6 (L) 13.0 - 17.0 g/dL   HCT 33.9 (L) 39.0 - 52.0 %   MCV 96.0 80.0 - 100.0 fL   MCH 30.0 26.0 - 34.0 pg   MCHC 31.3 30.0 - 36.0 g/dL   RDW 14.2 11.5 - 15.5 %   Platelets 214 150 - 400 K/uL   nRBC 0.0 0.0 - 0.2 %    Comment: Performed at [facility], [address], [place],  [address]  Comprehensive metabolic panel     Status: Abnormal   Collection Time: 01/16/19  2:30 AM  Result Value Ref Range   Sodium 138 135 - 145 mmol/L   Potassium 4.2 3.5 - 5.1 mmol/L   Chloride 114 (H) 98 - 111 mmol/L   CO2 18 (L) 22 - 32 mmol/L   Glucose, Bld 131 (H) 70 - 99 mg/dL   BUN 27 (H) 8 - 23 mg/dL   Creatinine, Ser 1.88 (H) 0.61 - 1.24 mg/dL   Calcium 7.1 (L) 8.9 - 10.3 mg/dL   Total Protein 5.3 (L) 6.5 - 8.1 g/dL   Albumin 2.7 (L) 3.5 - 5.0 g/dL   AST 384 (H) 15 - 41 U/L   ALT 73 (H) 0 - 44 U/L   Alkaline Phosphatase 53 38 - 126 U/L   Total Bilirubin 0.8  0.3 - 1.2 mg/dL   GFR calc non Af Amer 33 (L) >60 mL/min   GFR calc Af Amer 38 (L) >60 mL/min   Anion gap 6 5 - 15    Comment: Performed at [facility], [address], [place], [place] [address]  Protime-INR     Status: Abnormal   Collection Time: 01/16/19  2:30 AM  Result Value Ref Range   Prothrombin Time 18.4 (H) 11.4 - 15.2 seconds   INR 1.5 (H) 0.8 - 1.2    Comment: (NOTE) INR goal varies based on device and disease states. Performed at [facility], [address] [place], [place] [address]   Magnesium     Status: Abnormal   Collection Time: 01/16/19  2:30 AM  Result Value Ref Range   Magnesium 1.5 (L) 1.7 - 2.4 mg/dL    Comment: Performed at [facility], [address], [place], [place] [address]  Troponin I (High Sensitivity)     Status: Abnormal   Collection Time: 01/16/19  2:30 AM  Result Value Ref Range   Troponin I (High Sensitivity) >27,858.0 (HH) <18 ng/L     Comment: CRITICAL RESULT CALLED TO, READ BACK BY AND VERIFIED WITH: DANIELS,J @ 0313 ON 01/16/19 BY JUW Performed at [facility], [address] [place], [place] [address]   Heparin level (unfractionated)     Status: Abnormal   Collection Time: 01/16/19  3:44 AM  Result Value Ref Range   Heparin Unfractionated <0.10 (L) 0.30 - 0.70 IU/mL    Comment: (NOTE) If heparin results are below expected values, and patient dosage has  been confirmed, suggest follow up testing of antithrombin III levels. Performed at [facility], [address] [place], [place] [address]   CBC     Status: Abnormal   Collection Time: 01/16/19  3:44 AM  Result Value Ref Range   WBC 47.9 (H) 4.0 - 10.5 K/uL   RBC 3.79 (L) 4.22 - 5.81 MIL/uL   Hemoglobin 11.3 (L) 13.0 - 17.0 g/dL   HCT 36.4 (L) 39.0 - 52.0 %   MCV 96.0 80.0 - 100.0 fL   MCH 29.8 26.0 - 34.0 pg   MCHC 31.0 30.0 - 36.0 g/dL   RDW 14.1 11.5 - 15.5 %   Platelets 222 150 - 400 K/uL   nRBC 0.0 0.0 - 0.2 %    Comment: Performed at [facility], [address] [place], [place] [address]  Troponin I (High Sensitivity)     Status: Abnormal   Collection Time: 01/16/19  4:42 AM  Result Value Ref Range   Troponin I (High Sensitivity) >26,548.0 (HH) <18 ng/L    Comment: CRITICAL RESULT CALLED TO, READ BACK BY AND VERIFIED WITH: JEFFERSON @ 0553 ON [id number] BY HENDERSON L. Performed at [facility], [address], [place], [place] [address]   Heparin level (unfractionated)     Status: Abnormal   Collection Time: 01/16/19 10:24 AM  Result Value Ref Range   Heparin Unfractionated 1.09 (H) 0.30 - 0.70 IU/mL    Comment: (NOTE) If heparin results are below expected values, and patient dosage has  been confirmed, suggest follow up testing of antithrombin III levels. Performed at [facility], [place] [address], [place],  [address]   Comprehensive metabolic panel     Status: Abnormal   Collection Time: 01/16/19 10:24 AM  Result Value Ref Range    Sodium 142 135 - 145 mmol/L   Potassium 4.8 3.5 - 5.1 mmol/L  Chloride 110 98 - 111 mmol/L   CO2 18 (L) 22 - 32 mmol/L   Glucose, Bld 136 (H) 70 - 99 mg/dL   BUN 32 (H) 8 - 23 mg/dL   Creatinine, Ser 2.44 (H) 0.61 - 1.24 mg/dL   Calcium 7.6 (L) 8.9 - 10.3 mg/dL   Total Protein 5.6 (L) 6.5 - 8.1 g/dL   Albumin 2.9 (L) 3.5 - 5.0 g/dL   AST 544 (H) 15 - 41 U/L   ALT 183 (H) 0 - 44 U/L   Alkaline Phosphatase 65 38 - 126 U/L   Total Bilirubin 0.8 0.3 - 1.2 mg/dL   GFR calc non Af Amer 24 (L) >60 mL/min   GFR calc Af Amer 28 (L) >60 mL/min   Anion gap 14 5 - 15    Comment: Performed at [facility], [place] [address], [place], [place] [address]  CBC     Status: Abnormal   Collection Time: 01/16/19 10:24 AM  Result Value Ref Range   WBC 57.0 (HH) 4.0 - 10.5 K/uL    Comment: This critical result has verified and been called to [place] RN by Lupe Carney on 11 21 2020 at 1105, and has been read back. RTV REPEATED TO VERIFY CORRECTED ON 11/21 AT 1107: PREVIOUSLY REPORTED AS 57.0 This critical result has verified and been called to [place] by Lupe Carney on 11 21 2020 at 1105, and has been read back. RTV    RBC 4.24 4.22 - 5.81 MIL/uL   Hemoglobin 12.7 (L) 13.0 - 17.0 g/dL   HCT 40.6 39.0 - 52.0 %   MCV 95.8 80.0 - 100.0 fL   MCH 30.0 26.0 - 34.0 pg   MCHC 31.3 30.0 - 36.0 g/dL   RDW 14.1 11.5 - 15.5 %   Platelets 253 150 - 400 K/uL   nRBC 0.0 0.0 - 0.2 %    Comment: Performed at [facility] [address], [place], [place] [address]  Magnesium     Status: None   Collection Time: 01/16/19 10:24 AM  Result Value Ref Range   Magnesium 2.2 1.7 - 2.4 mg/dL    Comment: Performed at [facility] [address], [place], [place] [address]  Lactic acid, plasma     Status: Abnormal   Collection Time: 01/16/19 10:24 AM  Result Value Ref Range   Lactic Acid, Venous 4.7 (HH) 0.5 - 1.9 mmol/L    Comment: CRITICAL RESULT CALLED TO, READ BACK BY AND  VERIFIED WITH: RN C OSBORNE AT 1113 01/16/2019 BY L BENFIELD Performed at [facility], [place] [address] [place], [place] [address]   Procalcitonin - Baseline     Status: None   Collection Time: 01/16/19 10:24 AM  Result Value Ref Range   Procalcitonin 104.05 ng/mL    Comment:        Interpretation: PCT >= 10 ng/mL: Important systemic inflammatory response, almost exclusively due to severe bacterial sepsis or septic shock. (NOTE)       Sepsis PCT Algorithm           Lower Respiratory Tract                                      Infection PCT Algorithm    ----------------------------     ----------------------------         PCT < 0.25  ng/mL                PCT < 0.10 ng/mL         Strongly encourage             Strongly discourage   discontinuation of antibiotics    initiation of antibiotics    ----------------------------     -----------------------------       PCT 0.25 - 0.50 ng/mL            PCT 0.10 - 0.25 ng/mL               OR       >80% decrease in PCT            Discourage initiation of                                            antibiotics      Encourage discontinuation           of antibiotics    ----------------------------     -----------------------------         PCT >= 0.50 ng/mL              PCT 0.26 - 0.50 ng/mL                AND       <80% decrease in PCT             Encourage initiation of                                             antibiotics       Encourage continuation           of antibiotics    ----------------------------     -----------------------------        PCT >= 0.50 ng/mL                  PCT > 0.50 ng/mL               AND         increase in PCT                  Strongly encourage                                      initiation of antibiotics    Strongly encourage escalation           of antibiotics                                     -----------------------------                                           PCT <= 0.25 ng/mL  OR                                        > 80% decrease in PCT                                     Discontinue / Do not initiate                                             antibiotics Performed at [facility], [place] [address], [place], [place] [address]   Brain natriuretic peptide     Status: Abnormal   Collection Time: 01/16/19 10:24 AM  Result Value Ref Range   B Natriuretic Peptide 4,082.7 (H) 0.0 - 100.0 pg/mL    Comment: Performed at [facility] [address] [place], [place] [address]  Cortisol     Status: None   Collection Time: 01/16/19 11:29 AM  Result Value Ref Range   Cortisol, Plasma >100.0 ug/dL    Comment: RESULTS CONFIRMED BY MANUAL DILUTION (NOTE) AM    6.7 - 22.6 ug/dL PM   <10.0       ug/dL Performed at [facility] [address], [place], [place] [address]   Heparin level (unfractionated)     Status: Abnormal   Collection Time: 01/16/19 12:57 PM  Result Value Ref Range   Heparin Unfractionated 1.08 (H) 0.30 - 0.70 IU/mL    Comment: (NOTE) If heparin results are below expected values, and patient dosage has  been confirmed, suggest follow up testing of antithrombin III levels. Performed at [facility], [place] [address] [place], [place] [address]   Heparin level (unfractionated)     Status: None   Collection Time: 01/16/19  9:45 PM  Result Value Ref Range   Heparin Unfractionated 0.58 0.30 - 0.70 IU/mL    Comment: (NOTE) If heparin results are below expected values, and patient dosage has  been confirmed, suggest follow up testing of antithrombin III levels. Performed at [facility], [place] [address], [place], [place] [address]   I-STAT 7, (LYTES, BLD GAS, ICA, H+H)     Status: Abnormal   Collection Time: 01/17/19  2:07 AM  Result Value Ref Range   pH, Arterial 7.326 (L) 7.350 - 7.450   pCO2 arterial 30.1 (L) 32.0 - 48.0 mmHg   pO2, Arterial 94.0 83.0 - 108.0 mmHg   Bicarbonate  16.0 (L) 20.0 - 28.0 mmol/L   TCO2 17 (L) 22 - 32 mmol/L   O2 Saturation 97.0 %   Acid-base deficit 9.0 (H) 0.0 - 2.0 mmol/L   Sodium 139 135 - 145 mmol/L   Potassium 4.9 3.5 - 5.1 mmol/L   Calcium, Ion 1.04 (L) 1.15 - 1.40 mmol/L   HCT 36.0 (L) 39.0 - 52.0 %   Hemoglobin 12.2 (L) 13.0 - 17.0 g/dL   Patient temperature 96.3 F    Sample type ARTERIAL   Heparin level (unfractionated)     Status: None   Collection Time: 01/17/19  2:29 AM  Result Value Ref Range   Heparin Unfractionated 0.54 0.30 - 0.70 IU/mL    Comment: (NOTE) If heparin results are below expected  values, and patient dosage has  been confirmed, suggest follow up testing of antithrombin III levels. Performed at [facility], [place] [address], [place], [place] [address]   CBC     Status: Abnormal   Collection Time: 01/17/19  2:29 AM  Result Value Ref Range   WBC 52.8 (HH) 4.0 - 10.5 K/uL    Comment: REPEATED TO VERIFY CRITICAL VALUE NOTED.  VALUE IS CONSISTENT WITH PREVIOUSLY REPORTED AND CALLED VALUE.    RBC 4.12 (L) 4.22 - 5.81 MIL/uL   Hemoglobin 12.4 (L) 13.0 - 17.0 g/dL   HCT 38.4 (L) 39.0 - 52.0 %   MCV 93.2 80.0 - 100.0 fL   MCH 30.1 26.0 - 34.0 pg   MCHC 32.3 30.0 - 36.0 g/dL   RDW 14.3 11.5 - 15.5 %   Platelets 205 150 - 400 K/uL   nRBC 0.0 0.0 - 0.2 %    Comment: Performed at [facility] [address] [place], [place] [address]  Procalcitonin     Status: None   Collection Time: 01/17/19  2:29 AM  Result Value Ref Range   Procalcitonin 122.51 ng/mL    Comment:        Interpretation: PCT >= 10 ng/mL: Important systemic inflammatory response, almost exclusively due to severe bacterial sepsis or septic shock. (NOTE)       Sepsis PCT Algorithm           Lower Respiratory Tract                                      Infection PCT Algorithm    ----------------------------     ----------------------------         PCT < 0.25 ng/mL                PCT < 0.10 ng/mL         Strongly encourage              Strongly discourage   discontinuation of antibiotics    initiation of antibiotics    ----------------------------     -----------------------------       PCT 0.25 - 0.50 ng/mL            PCT 0.10 - 0.25 ng/mL               OR       >80% decrease in PCT            Discourage initiation of                                            antibiotics      Encourage discontinuation           of antibiotics    ----------------------------     -----------------------------         PCT >= 0.50 ng/mL              PCT 0.26 - 0.50 ng/mL                AND       <80% decrease in PCT             Encourage initiation of  antibiotics       Encourage continuation           of antibiotics    ----------------------------     -----------------------------        PCT >= 0.50 ng/mL                  PCT > 0.50 ng/mL               AND         increase in PCT                  Strongly encourage                                      initiation of antibiotics    Strongly encourage escalation           of antibiotics                                     -----------------------------                                           PCT <= 0.25 ng/mL                                                 OR                                        > 80% decrease in PCT                                     Discontinue / Do not initiate                                             antibiotics Performed at [facility], [address] [place], [place] [address]   Magnesium     Status: None   Collection Time: 01/17/19  2:29 AM  Result Value Ref Range   Magnesium 2.2 1.7 - 2.4 mg/dL    Comment: Performed at [facility], [place] [address], [place], [place] [address]  Phosphorus     Status: Abnormal   Collection Time: 01/17/19  2:29 AM  Result Value Ref Range   Phosphorus 6.4 (H) 2.5 - 4.6 mg/dL    Comment: Performed at [facility] [address],  [place], [place] [address]  Troponin I (High Sensitivity)     Status: Abnormal   Collection Time: 01/17/19  2:29 AM  Result Value Ref Range   Troponin I (High Sensitivity) >27,000 (HH) <18 ng/L    Comment: CRITICAL RESULT CALLED TO, READ BACK BY AND VERIFIED WITH: CAMMON M,RN 01/17/19 0349 WAYK Performed at [facility], [place] [address], [place], [place]  [address]   Comprehensive metabolic panel     Status: Abnormal   Collection Time: 01/17/19  2:29 AM  Result Value Ref Range   Sodium 139 135 - 145 mmol/L   Potassium 5.0 3.5 - 5.1 mmol/L   Chloride 110 98 - 111 mmol/L   CO2 18 (L) 22 - 32 mmol/L   Glucose, Bld 132 (H) 70 - 99 mg/dL   BUN 44 (H) 8 - 23 mg/dL   Creatinine, Ser 2.98 (H) 0.61 - 1.24 mg/dL   Calcium 7.5 (L) 8.9 - 10.3 mg/dL   Total Protein 5.5 (L) 6.5 - 8.1 g/dL   Albumin 2.6 (L) 3.5 - 5.0 g/dL   AST 410 (H) 15 - 41 U/L   ALT 159 (H) 0 - 44 U/L   Alkaline Phosphatase 72 38 - 126 U/L   Total Bilirubin 1.0 0.3 - 1.2 mg/dL   GFR calc non Af Amer 19 (L) >60 mL/min   GFR calc Af Amer 22 (L) >60 mL/min   Anion gap 11 5 - 15    Comment: Performed at [facility], [place] [address], [place], [place] [address]  Lactic acid, plasma     Status: Abnormal   Collection Time: 01/17/19  2:29 AM  Result Value Ref Range   Lactic Acid, Venous 3.5 (HH) 0.5 - 1.9 mmol/L    Comment: CRITICAL VALUE NOTED.  VALUE IS CONSISTENT WITH PREVIOUSLY REPORTED AND CALLED VALUE. Performed at [facility], [place] [address] [place], [place] [address]   Lipase, blood     Status: Abnormal   Collection Time: 01/17/19  8:21 AM  Result Value Ref Range   Lipase 462 (H) 11 - 51 U/L    Comment: RESULTS CONFIRMED BY MANUAL DILUTION Performed at [facility], [place] [address] [place], [place] [address]   Vancomycin, random     Status: None   Collection Time: 01/17/19  8:21 AM  Result Value Ref Range   Vancomycin Rm 16     Comment:        Random Vancomycin therapeutic range is dependent on  dosage and time of specimen collection. A peak range is 20.0-40.0 ug/mL A trough range is 5.0-15.0 ug/mL        Performed at [facility] [address], [place], [place] [address]     Dg Abdomen 1 View  Result Date: 01/13/2019 CLINICAL DATA:  Central line placement. EXAM: ABDOMEN - 1 VIEW COMPARISON:  None. FINDINGS: Right femoral vein catheter has been inserted. The tip is in the region of the right iliac vein at the level of the S1 segment of the sacrum. The visualized bowel gas pattern is normal. No acute bone abnormality. IMPRESSION: 1. Central line tip is in the right iliac vein at the level of the S1 segment of the sacrum. 2. Benign-appearing abdomen. Electronically Signed   By: Lorriane Shire M.D.   On: 01/06/2019 15:02   Ct Chest W Contrast  Result Date: 01/19/2019 CLINICAL DATA:  Fever of unknown origin, sepsis, leukocytosis. EXAM: CT CHEST, ABDOMEN, AND PELVIS WITH CONTRAST TECHNIQUE: Multidetector CT imaging of the chest, abdomen and pelvis was performed following the standard protocol during bolus administration of intravenous contrast. CONTRAST:  71m OMNIPAQUE IOHEXOL 300 MG/ML  SOLN COMPARISON:  CT abdomen pelvis 12/14/2018 and CT chest 07/22/2018. FINDINGS: CT CHEST FINDINGS Cardiovascular: Atherosclerotic calcification of the aorta, aortic valve and coronary arteries. Heart is enlarged. No pericardial effusion. Mediastinum/Nodes: 1.6 cm low-attenuation left thyroid  nodule is unchanged. No pathologically enlarged mediastinal, hilar or axillary lymph nodes. Esophagus is unremarkable. Lungs/Pleura: Fairly diffuse septal thickening. Mild dependent atelectasis bilaterally. No pleural fluid. Debris is seen dependently in the lower trachea and mainstem bronchi. Musculoskeletal: Degenerative changes in the spine. No worrisome lytic or sclerotic lesions. CT ABDOMEN PELVIS FINDINGS Hepatobiliary: Hyperattenuating lesion in the dome of the liver measures 1.2 cm (2/42) and is  nonspecific, possibly representing a flash fill hemangioma or perfusion anomaly. Gallbladder appears mildly distended and there is surrounding haziness and stranding, similar to 12/14/2018. No biliary ductal dilatation. Pancreas: Negative. Spleen: Negative. Adrenals/Urinary Tract: Adrenal glands and right kidney are unremarkable. Stones are seen in the left kidney. Probable left renal sinus cysts. Ureters are decompressed. Bladder is low in volume. Stomach/Bowel: Stomach, small bowel, appendix and colon are unremarkable. Vascular/Lymphatic: Atherosclerotic calcification of the aorta without aneurysm. Right-sided femoral venous line terminates near the bifurcation of the internal and external iliac veins on the right. No pathologically enlarged lymph nodes. Reproductive: Prostatectomy. Other: Small bilateral inguinal hernias contain fat. Tiny amount of fluid in the right paracolic gutter. Mesenteries and peritoneum are otherwise unremarkable. Musculoskeletal: Right hip arthroplasty. Degenerative changes in the left hip and spine. IMPRESSION: 1. Mild pericholecystic edema, similar to 12/14/2018. A common bile duct stone was questioned at that time. Consider right upper quadrant ultrasound in further evaluation for acute cholecystitis. Otherwise, no findings to explain the patient's fever and leukocytosis. 2. Pulmonary edema. 3. Left renal stones. 4. Aortic atherosclerosis (ICD10-170.0). Coronary artery calcification. Electronically Signed   By: Lorin Picket M.D.   On: 01/16/2019 16:34   US Abdomen Complete  Result Date: 01/16/2019 CLINICAL DATA:  Abdominal pain for 1 day.  Elevated LFTs. EXAM: ABDOMEN ULTRASOUND COMPLETE COMPARISON:  CT abdomen pelvis dated 01/24/2019 FINDINGS: Gallbladder: The gallbladder wall is thickened, measuring 9 mm. Nonshadowing gallstones/sludge are seen layering dependently within the gallbladder. Sonographic Percell Miller sign is negative. Common bile duct: Diameter: 2 mm Liver: No focal  lesion identified. Within normal limits in parenchymal echogenicity. Portal vein is patent on color Doppler imaging with normal direction of blood flow towards the liver. IVC: No abnormality visualized. Pancreas: Obscured by overlying bowel gas. Spleen: Size and appearance within normal limits. Right Kidney: Length: 9.8. Echogenicity within normal limits. No mass or hydronephrosis visualized. Left Kidney: Length: 10.0. Echogenicity within normal limits. A nonobstructive left renal calculus measures 1.2 cm. No mass or hydronephrosis visualized. Abdominal aorta: No aneurysm visualized. Other findings: None. IMPRESSION: 1. Thickened gallbladder wall with nonshadowing gallstones/sludge. Although the sonographic Percell Miller sign is reported as negative, these findings are suspicious for acute cholecystitis. Electronically Signed   By: Zerita Boers M.D.   On: 01/16/2019 13:44   Ct Abdomen Pelvis W Contrast  Result Date: 01/20/2019 CLINICAL DATA:  Fever of unknown origin, sepsis, leukocytosis. EXAM: CT CHEST, ABDOMEN, AND PELVIS WITH CONTRAST TECHNIQUE: Multidetector CT imaging of the chest, abdomen and pelvis was performed following the standard protocol during bolus administration of intravenous contrast. CONTRAST:  72m OMNIPAQUE IOHEXOL 300 MG/ML  SOLN COMPARISON:  CT abdomen pelvis 12/14/2018 and CT chest 07/22/2018. FINDINGS: CT CHEST FINDINGS Cardiovascular: Atherosclerotic calcification of the aorta, aortic valve and coronary arteries. Heart is enlarged. No pericardial effusion. Mediastinum/Nodes: 1.6 cm low-attenuation left thyroid nodule is unchanged. No pathologically enlarged mediastinal, hilar or axillary lymph nodes. Esophagus is unremarkable. Lungs/Pleura: Fairly diffuse septal thickening. Mild dependent atelectasis bilaterally. No pleural fluid. Debris is seen dependently in the lower trachea and mainstem bronchi. Musculoskeletal: Degenerative changes in the  spine. No worrisome lytic or sclerotic lesions.  CT ABDOMEN PELVIS FINDINGS Hepatobiliary: Hyperattenuating lesion in the dome of the liver measures 1.2 cm (2/42) and is nonspecific, possibly representing a flash fill hemangioma or perfusion anomaly. Gallbladder appears mildly distended and there is surrounding haziness and stranding, similar to 12/14/2018. No biliary ductal dilatation. Pancreas: Negative. Spleen: Negative. Adrenals/Urinary Tract: Adrenal glands and right kidney are unremarkable. Stones are seen in the left kidney. Probable left renal sinus cysts. Ureters are decompressed. Bladder is low in volume. Stomach/Bowel: Stomach, small bowel, appendix and colon are unremarkable. Vascular/Lymphatic: Atherosclerotic calcification of the aorta without aneurysm. Right-sided femoral venous line terminates near the bifurcation of the internal and external iliac veins on the right. No pathologically enlarged lymph nodes. Reproductive: Prostatectomy. Other: Small bilateral inguinal hernias contain fat. Tiny amount of fluid in the right paracolic gutter. Mesenteries and peritoneum are otherwise unremarkable. Musculoskeletal: Right hip arthroplasty. Degenerative changes in the left hip and spine. IMPRESSION: 1. Mild pericholecystic edema, similar to 12/14/2018. A common bile duct stone was questioned at that time. Consider right upper quadrant ultrasound in further evaluation for acute cholecystitis. Otherwise, no findings to explain the patient's fever and leukocytosis. 2. Pulmonary edema. 3. Left renal stones. 4. Aortic atherosclerosis (ICD10-170.0). Coronary artery calcification. Electronically Signed   By: Lorin Picket M.D.   On: 01/06/2019 16:34   Dg Chest Port 1 View  Result Date: 01/17/2019 CLINICAL DATA:  Respiratory distress. Abnormal respiration. EXAM: PORTABLE CHEST 1 VIEW COMPARISON:  Radiograph yesterday. CT 01/24/2019 FINDINGS: Unchanged elevation of right hemidiaphragm. Progressive bilateral perihilar interstitial opacities and septal  thickening. Findings are slightly asymmetric increased in the right suprahilar lung. Cardiomegaly slightly progressed. No visible pleural effusions. No pneumothorax. No acute osseous abnormalities. IMPRESSION: Worsening pulmonary edema. No element of superimposed pneumonia is also considered given the developing asymmetry. Slight increase in cardiomegaly. Electronically Signed   By: Keith Rake M.D.   On: 01/17/2019 02:06   Dg Chest Port 1 View  Result Date: 01/16/2019 CLINICAL DATA:  Wheezing and left-sided chest pain EXAM: PORTABLE CHEST 1 VIEW COMPARISON:  Chest CT from yesterday FINDINGS: Diffuse interstitial opacity with Kerley lines. There is patchy airspace opacity as well. Mild cardiomegaly. Elevated right diaphragm which is also seen previously. No visible effusion or pneumothorax IMPRESSION: CHF pattern which is worsened. Electronically Signed   By: Monte Fantasia M.D.   On: 01/16/2019 07:15   Dg Chest Port 1 View  Result Date: 01/07/2019 CLINICAL DATA:  Fever. Sudden onset of weakness last night. EXAM: PORTABLE CHEST 1 VIEW COMPARISON:  Radiographs 12/14/2018 and 08/04/2017.  CT 07/22/2018. FINDINGS: 1212 hours. The heart size and mediastinal contours are stable with aortic atherosclerosis. There is stable chronic elevation/eventration of the right hemidiaphragm with associated chronic right basilar atelectasis. The lungs are otherwise clear. There is no pleural effusion or pneumothorax. The bones appear unremarkable. Telemetry leads overlie the chest. IMPRESSION: Stable postoperative chest with chronic right basilar atelectasis. No acute cardiopulmonary process. Electronically Signed   By: Richardean Sale M.D.   On: 01/22/2019 12:42    Review of Systems  Constitutional: Negative for chills, fever and malaise/fatigue.  HENT: Negative for ear discharge, hearing loss and sore throat.   Eyes: Negative for blurred vision and discharge.  Respiratory: Negative for cough and shortness of  breath.   Cardiovascular: Negative for chest pain, orthopnea and leg swelling.  Gastrointestinal: Positive for abdominal pain. Negative for constipation, diarrhea, heartburn, nausea and vomiting.  Musculoskeletal: Negative for myalgias and neck pain.  Skin: Negative for itching and rash.  Neurological: Negative for dizziness, focal weakness, seizures and loss of consciousness.  Endo/Heme/Allergies: Negative for environmental allergies. Does not bruise/bleed easily.  Psychiatric/Behavioral: Negative for depression and suicidal ideas.  All other systems reviewed and are negative.  Blood pressure (!) 124/54, pulse 94, temperature 97.8 F (36.6 C), temperature source Oral, resp. rate (!) 42, height 5' 8"  (1.727 m), weight 83.6 kg, SpO2 99 %. Physical Exam  Constitutional: He is oriented to person, place, and time. Vital signs are normal. He appears well-developed and well-nourished.  Conversant No acute distress  Eyes: Lids are normal. No scleral icterus.  No lid lag Moist conjunctiva  Neck: No tracheal tenderness present. No thyromegaly present.  No cervical lymphadenopathy  Cardiovascular: Normal rate, regular rhythm and intact distal pulses.  No murmur heard. Respiratory: Effort normal and breath sounds normal. He has no wheezes. He has no rales.  GI: Soft. Bowel sounds are normal. There is no hepatosplenomegaly. There is abdominal tenderness (RUQ). There is no rebound and no guarding. No hernia.  Neurological: He is alert and oriented to person, place, and time.  Normal gait and station  Skin: Skin is warm. No rash noted. No cyanosis. Nails show no clubbing.  Normal skin turgor  Psychiatric: Judgment normal.  Appropriate affect    Assessment/Plan: 83 year old male with NSTEMI, likely acute cholecystitis AKI History of myasthenia gravis on chronic steroids Hypertension Hyperlipidemia   1.  Patient currently being treated for NSTEMI.  Patient currently on heparin drip, Plavix,  Levophed.  Patient not be an optimal candidate for lap chole. 2.  Would recommend  con't antibiotics, IR consult for cholecystostomy tube placement.  I discussed with the patient and his wife this could potentially be a long-term treatment secondary to patient's cardiac conditions.  If unable to have c-tube placed, pt would need to be treated with IV abx 3.  We will follow along.   Ralene Ok 01/17/2019, 11:53 AM

## 2019-01-17 NOTE — Progress Notes (Signed)
CRITICAL VALUE ALERT  Critical Value:  Lactic acid 3.5, WBC 52.8  Date & Time Notied: 01/17/19 0310  Provider Notified: CCM   Orders Received/Actions taken: no new orders

## 2019-01-17 NOTE — Procedures (Signed)
OGT Placement By MD  OGT placed under direct laryngoscopy and verified by auscultation  Rush Farmer, M.D. [facility] Pulmonary/Critical Care Medicine.

## 2019-01-17 NOTE — Progress Notes (Signed)
Called by bedside RN, patient laid flat and desaturated.  Spoke with IR.  Will give platelets one hour prior to the procedure and hold heparin 2 hours prior.  After evaluating the patient.  He clearly can not lay flat and will need to lay flat for the procedure and is not desaturating will intubate and place OGT.  Precedex and fentanyl ordered.  Plan on IR for prec drainage in AM.  Family updated by RN.  The patient is critically ill with multiple organ systems failure and requires high complexity decision making for assessment and support, frequent evaluation and titration of therapies, application of advanced monitoring technologies and extensive interpretation of multiple databases.   Critical Care Time devoted to patient care services described in this note is  60  Minutes. This time reflects time of care of this signee Dr Jennet Maduro. This critical care time does not reflect procedure time, or teaching time or supervisory time of PA/NP/Med student/Med Resident etc but could involve care discussion time.  Rush Farmer, M.D. [facility] Pulmonary/Critical Care Medicine.

## 2019-01-17 NOTE — Progress Notes (Signed)
[facility] Progress Note Patient Name: VALTON HAPP DOB: 03/31/1935 MRN: [id number]   Date of Service  01/17/2019  HPI/Events of Note  ABG noted. I also saw lactate was 3.5 at 2.30 am   eICU Interventions  Increase RR to 22. VT is right at 8 cc/kg IBW. Updated vent orders placed and repeat ABG at 1 am In addition, check a BMP and lactate now  Discussed with RN over camera  Please inform results      Intervention Category Major Interventions: Respiratory failure - evaluation and management;Other:  Margaretmary Lombard 01/17/2019, 8:52 PM

## 2019-01-17 NOTE — Progress Notes (Signed)
Pharmacy Antibiotic Note  Duane Burns is a 83 y.o. male admitted on 12/31/2018 with sepsis.  Pharmacy has been consulted for vancomycin and meropenem dosing.  Patient transferred to [facility] yesterday. Afebrile overnight, wbc very elevated in 50s, LA 3.5, pct 122. Patient also having AKI with scr trending up to 2.98 today, received IV lasix challenges yesterday. Given decrease in renal function a random vancomycin level was drawn prior to redosing. Level came back within range at 16 drawn about 20 hours from last dose.   Plan: Will continue Meropenem 1g IV q12h Restart vancomycin 1g IV q24h  Consider rechecking vancomycin levels if scr worsens and LOT is extended Follow up renal function & cultures  Height: 5\' 8"  (172.7 cm) Weight: 184 lb 4.9 oz (83.6 kg) IBW/kg (Calculated) : 68.4  Temp (24hrs), Avg:97.2 F (36.2 C), Min:96.6 F (35.9 C), Max:97.4 F (36.3 C)  Recent Labs  Lab 12/27/2018 1207 01/11/2019 1830 01/16/19 0230 01/16/19 0344 01/16/19 1024 01/17/19 0229 01/17/19 0821  WBC 37.8*  --  49.9* 47.9* 57.0* 52.8*  --   CREATININE 1.65*  --  1.88*  --  2.44* 2.98*  --   LATICACIDVEN 3.7* 2.9*  --   --  4.7* 3.5*  --   VANCORANDOM  --   --   --   --   --   --  16    Estimated Creatinine Clearance: 20.5 mL/min (A) (by C-G formula based on SCr of 2.98 mg/dL (H)).    No Known Allergies  Antimicrobials this admission:  11/20 Vanc >> 11/20 Meropenem >>  Dose adjustments this admission:  11/22 Vancomycin random level of 16  Microbiology results:  11/20 COVID: neg 11/20 [id number] 11/20 UCx:ng 11/20 MRSA PCR:neg 11/22 bldx2: sent  Thank you for allowing pharmacy to be a part of this patient's care.  Erin Hearing PharmD., BCPS Clinical Pharmacist 01/17/2019 10:16 AM

## 2019-01-17 NOTE — Progress Notes (Signed)
[facility] Progress Note Patient Name: Duane Burns DOB: 1935-08-27 MRN: [id number]   Date of Service  01/17/2019  HPI/Events of Note  Notified of BMP and lactate Improving K and lactate, worsening creatinine Bicarb 16, most recent Ph was 7.29 Low EF in 20s, on pressors  eICU Interventions  Repeat lactate and ABG at 1 am Please call with results      Intervention Category Intermediate Interventions: Electrolyte abnormality - evaluation and management  Margaretmary Lombard 01/17/2019, 11:30 PM

## 2019-01-17 NOTE — Progress Notes (Addendum)
[facility] Progress Note Patient Name: SINCLAIR SCHIPP DOB: May 09, 1935 MRN: [id number]   Date of Service  01/17/2019  HPI/Events of Note  Unclear if OG tube is in position or not. Prior X ray suggested advancement  eICU Interventions  KUB ordered for confirmation     Intervention Category Minor Interventions: Other:  Margaretmary Lombard 01/17/2019, 11:40 PM   12:55 am Repeat ABG and lactate noted Improved Continue to wean fio2 Please call if needed

## 2019-01-17 NOTE — Progress Notes (Signed)
Progress Note  Patient Name: Duane Burns Date of Encounter: 01/17/2019  Primary Cardiologist: Kate Sable, MD   Subjective   SOB overnight. Confusion,agitation this AM.   Inpatient Medications    Scheduled Meds:  aspirin EC  81 mg Oral Daily   chlorhexidine  15 mL Mouth Rinse BID   Chlorhexidine Gluconate Cloth  6 each Topical Daily   cholecalciferol  1,000 Units Oral Daily   clopidogrel  75 mg Oral Daily   LORazepam  1 mg Intravenous Once   mouth rinse  15 mL Mouth Rinse q12n4p   multivitamin with minerals  1 tablet Oral Daily   pyridostigmine  60 mg Oral TID   senna-docusate  2 tablet Oral BID   sodium chloride flush  3 mL Intravenous Q12H   Continuous Infusions:  sodium chloride     heparin 800 Units/hr (01/17/19 1100)   meropenem (MERREM) IV Stopped (01/17/19 0656)   norepinephrine (LEVOPHED) Adult infusion 4 mcg/min (01/17/19 1100)   vancomycin     PRN Meds: sodium chloride, acetaminophen **OR** acetaminophen, guaiFENesin, levalbuterol, morphine injection, ondansetron **OR** ondansetron (ZOFRAN) IV, polyethylene glycol, sodium chloride flush, traZODone   Vital Signs    Vitals:   01/17/19 1000 01/17/19 1015 01/17/19 1030 01/17/19 1045  BP: (!) 88/70 (!) 123/54 122/63 (!) 116/59  Pulse: 96 86 87 96  Resp: (!) 30 (!) 35 (!) 35 (!) 21  Temp:      TempSrc:      SpO2: 98% 100% 100% 98%  Weight:      Height:        Intake/Output Summary (Last 24 hours) at 01/17/2019 1109 Last data filed at 01/17/2019 1100 Gross per 24 hour  Intake 3345.56 ml  Output 215 ml  Net 3130.56 ml   Last 3 Weights 01/16/2019 01/14/2019 11/17/2018  Weight (lbs) 184 lb 4.9 oz 192 lb 192 lb  Weight (kg) 83.6 kg 87.091 kg 87.091 kg      Telemetry    SR - Personally Reviewed  ECG    n/a - Personally Reviewed  Physical Exam   GEN: No acute distress.   Neck: elevated JVD Cardiac: RRR, no murmurs, rubs, or gallops.  Respiratory: coarse  bilatearlly GI: Soft, nontender, non-distended  MS: No edema; No deformity. Neuro:  Nonfocal  Psych: Normal affect   Labs    High Sensitivity Troponin:   Recent Labs  Lab 01/16/19 0230 01/16/19 0442 01/17/19 0229  TROPONINIHS >27,858.0* >26,548.0* >27,000*      Chemistry Recent Labs  Lab 01/16/19 0230 01/16/19 1024 01/17/19 0207 01/17/19 0229  NA 138 142 139 139  K 4.2 4.8 4.9 5.0  CL 114* 110  --  110  CO2 18* 18*  --  18*  GLUCOSE 131* 136*  --  132*  BUN 27* 32*  --  44*  CREATININE 1.88* 2.44*  --  2.98*  CALCIUM 7.1* 7.6*  --  7.5*  PROT 5.3* 5.6*  --  5.5*  ALBUMIN 2.7* 2.9*  --  2.6*  AST 384* 544*  --  410*  ALT 73* 183*  --  159*  ALKPHOS 53 65  --  72  BILITOT 0.8 0.8  --  1.0  GFRNONAA 33* 24*  --  19*  GFRAA 38* 28*  --  22*  ANIONGAP 6 14  --  11     Hematology Recent Labs  Lab 01/16/19 0344 01/16/19 1024 01/17/19 0207 01/17/19 0229  WBC 47.9* 57.0*  --  52.8*  RBC  3.79* 4.24  --  4.12*  HGB 11.3* 12.7* 12.2* 12.4*  HCT 36.4* 40.6 36.0* 38.4*  MCV 96.0 95.8  --  93.2  MCH 29.8 30.0  --  30.1  MCHC 31.0 31.3  --  32.3  RDW 14.1 14.1  --  14.3  PLT 222 253  --  205    BNP Recent Labs  Lab 01/16/19 1024  BNP 4,082.7*     DDimer No results for input(s): DDIMER in the last 168 hours.   Radiology    Dg Abdomen 1 View  Result Date: 01/10/2019 CLINICAL DATA:  Central line placement. EXAM: ABDOMEN - 1 VIEW COMPARISON:  None. FINDINGS: Right femoral vein catheter has been inserted. The tip is in the region of the right iliac vein at the level of the S1 segment of the sacrum. The visualized bowel gas pattern is normal. No acute bone abnormality. IMPRESSION: 1. Central line tip is in the right iliac vein at the level of the S1 segment of the sacrum. 2. Benign-appearing abdomen. Electronically Signed   By: Lorriane Shire M.D.   On: 01/11/2019 15:02   Ct Chest W Contrast  Result Date: 01/16/2019 CLINICAL DATA:  Fever of unknown origin,  sepsis, leukocytosis. EXAM: CT CHEST, ABDOMEN, AND PELVIS WITH CONTRAST TECHNIQUE: Multidetector CT imaging of the chest, abdomen and pelvis was performed following the standard protocol during bolus administration of intravenous contrast. CONTRAST:  71mL OMNIPAQUE IOHEXOL 300 MG/ML  SOLN COMPARISON:  CT abdomen pelvis 12/14/2018 and CT chest 07/22/2018. FINDINGS: CT CHEST FINDINGS Cardiovascular: Atherosclerotic calcification of the aorta, aortic valve and coronary arteries. Heart is enlarged. No pericardial effusion. Mediastinum/Nodes: 1.6 cm low-attenuation left thyroid nodule is unchanged. No pathologically enlarged mediastinal, hilar or axillary lymph nodes. Esophagus is unremarkable. Lungs/Pleura: Fairly diffuse septal thickening. Mild dependent atelectasis bilaterally. No pleural fluid. Debris is seen dependently in the lower trachea and mainstem bronchi. Musculoskeletal: Degenerative changes in the spine. No worrisome lytic or sclerotic lesions. CT ABDOMEN PELVIS FINDINGS Hepatobiliary: Hyperattenuating lesion in the dome of the liver measures 1.2 cm (2/42) and is nonspecific, possibly representing a flash fill hemangioma or perfusion anomaly. Gallbladder appears mildly distended and there is surrounding haziness and stranding, similar to 12/14/2018. No biliary ductal dilatation. Pancreas: Negative. Spleen: Negative. Adrenals/Urinary Tract: Adrenal glands and right kidney are unremarkable. Stones are seen in the left kidney. Probable left renal sinus cysts. Ureters are decompressed. Bladder is low in volume. Stomach/Bowel: Stomach, small bowel, appendix and colon are unremarkable. Vascular/Lymphatic: Atherosclerotic calcification of the aorta without aneurysm. Right-sided femoral venous line terminates near the bifurcation of the internal and external iliac veins on the right. No pathologically enlarged lymph nodes. Reproductive: Prostatectomy. Other: Small bilateral inguinal hernias contain fat. Tiny amount  of fluid in the right paracolic gutter. Mesenteries and peritoneum are otherwise unremarkable. Musculoskeletal: Right hip arthroplasty. Degenerative changes in the left hip and spine. IMPRESSION: 1. Mild pericholecystic edema, similar to 12/14/2018. A common bile duct stone was questioned at that time. Consider right upper quadrant ultrasound in further evaluation for acute cholecystitis. Otherwise, no findings to explain the patient's fever and leukocytosis. 2. Pulmonary edema. 3. Left renal stones. 4. Aortic atherosclerosis (ICD10-170.0). Coronary artery calcification. Electronically Signed   By: Lorin Picket M.D.   On: 01/25/2019 16:34   US Abdomen Complete  Result Date: 01/16/2019 CLINICAL DATA:  Abdominal pain for 1 day.  Elevated LFTs. EXAM: ABDOMEN ULTRASOUND COMPLETE COMPARISON:  CT abdomen pelvis dated 01/20/2019 FINDINGS: Gallbladder: The gallbladder wall is thickened,  measuring 9 mm. Nonshadowing gallstones/sludge are seen layering dependently within the gallbladder. Sonographic Percell Miller sign is negative. Common bile duct: Diameter: 2 mm Liver: No focal lesion identified. Within normal limits in parenchymal echogenicity. Portal vein is patent on color Doppler imaging with normal direction of blood flow towards the liver. IVC: No abnormality visualized. Pancreas: Obscured by overlying bowel gas. Spleen: Size and appearance within normal limits. Right Kidney: Length: 9.8. Echogenicity within normal limits. No mass or hydronephrosis visualized. Left Kidney: Length: 10.0. Echogenicity within normal limits. A nonobstructive left renal calculus measures 1.2 cm. No mass or hydronephrosis visualized. Abdominal aorta: No aneurysm visualized. Other findings: None. IMPRESSION: 1. Thickened gallbladder wall with nonshadowing gallstones/sludge. Although the sonographic Percell Miller sign is reported as negative, these findings are suspicious for acute cholecystitis. Electronically Signed   By: Zerita Boers M.D.   On:  01/16/2019 13:44   Ct Abdomen Pelvis W Contrast  Result Date: 01/08/2019 CLINICAL DATA:  Fever of unknown origin, sepsis, leukocytosis. EXAM: CT CHEST, ABDOMEN, AND PELVIS WITH CONTRAST TECHNIQUE: Multidetector CT imaging of the chest, abdomen and pelvis was performed following the standard protocol during bolus administration of intravenous contrast. CONTRAST:  18mL OMNIPAQUE IOHEXOL 300 MG/ML  SOLN COMPARISON:  CT abdomen pelvis 12/14/2018 and CT chest 07/22/2018. FINDINGS: CT CHEST FINDINGS Cardiovascular: Atherosclerotic calcification of the aorta, aortic valve and coronary arteries. Heart is enlarged. No pericardial effusion. Mediastinum/Nodes: 1.6 cm low-attenuation left thyroid nodule is unchanged. No pathologically enlarged mediastinal, hilar or axillary lymph nodes. Esophagus is unremarkable. Lungs/Pleura: Fairly diffuse septal thickening. Mild dependent atelectasis bilaterally. No pleural fluid. Debris is seen dependently in the lower trachea and mainstem bronchi. Musculoskeletal: Degenerative changes in the spine. No worrisome lytic or sclerotic lesions. CT ABDOMEN PELVIS FINDINGS Hepatobiliary: Hyperattenuating lesion in the dome of the liver measures 1.2 cm (2/42) and is nonspecific, possibly representing a flash fill hemangioma or perfusion anomaly. Gallbladder appears mildly distended and there is surrounding haziness and stranding, similar to 12/14/2018. No biliary ductal dilatation. Pancreas: Negative. Spleen: Negative. Adrenals/Urinary Tract: Adrenal glands and right kidney are unremarkable. Stones are seen in the left kidney. Probable left renal sinus cysts. Ureters are decompressed. Bladder is low in volume. Stomach/Bowel: Stomach, small bowel, appendix and colon are unremarkable. Vascular/Lymphatic: Atherosclerotic calcification of the aorta without aneurysm. Right-sided femoral venous line terminates near the bifurcation of the internal and external iliac veins on the right. No  pathologically enlarged lymph nodes. Reproductive: Prostatectomy. Other: Small bilateral inguinal hernias contain fat. Tiny amount of fluid in the right paracolic gutter. Mesenteries and peritoneum are otherwise unremarkable. Musculoskeletal: Right hip arthroplasty. Degenerative changes in the left hip and spine. IMPRESSION: 1. Mild pericholecystic edema, similar to 12/14/2018. A common bile duct stone was questioned at that time. Consider right upper quadrant ultrasound in further evaluation for acute cholecystitis. Otherwise, no findings to explain the patient's fever and leukocytosis. 2. Pulmonary edema. 3. Left renal stones. 4. Aortic atherosclerosis (ICD10-170.0). Coronary artery calcification. Electronically Signed   By: Lorin Picket M.D.   On: 01/11/2019 16:34   Dg Chest Port 1 View  Result Date: 01/17/2019 CLINICAL DATA:  Respiratory distress. Abnormal respiration. EXAM: PORTABLE CHEST 1 VIEW COMPARISON:  Radiograph yesterday. CT 01/19/2019 FINDINGS: Unchanged elevation of right hemidiaphragm. Progressive bilateral perihilar interstitial opacities and septal thickening. Findings are slightly asymmetric increased in the right suprahilar lung. Cardiomegaly slightly progressed. No visible pleural effusions. No pneumothorax. No acute osseous abnormalities. IMPRESSION: Worsening pulmonary edema. No element of superimposed pneumonia is also considered given the developing  asymmetry. Slight increase in cardiomegaly. Electronically Signed   By: Keith Rake M.D.   On: 01/17/2019 02:06   Dg Chest Port 1 View  Result Date: 01/16/2019 CLINICAL DATA:  Wheezing and left-sided chest pain EXAM: PORTABLE CHEST 1 VIEW COMPARISON:  Chest CT from yesterday FINDINGS: Diffuse interstitial opacity with Kerley lines. There is patchy airspace opacity as well. Mild cardiomegaly. Elevated right diaphragm which is also seen previously. No visible effusion or pneumothorax IMPRESSION: CHF pattern which is worsened.  Electronically Signed   By: Monte Fantasia M.D.   On: 01/16/2019 07:15   Dg Chest Port 1 View  Result Date: 01/10/2019 CLINICAL DATA:  Fever. Sudden onset of weakness last night. EXAM: PORTABLE CHEST 1 VIEW COMPARISON:  Radiographs 12/14/2018 and 08/04/2017.  CT 07/22/2018. FINDINGS: 1212 hours. The heart size and mediastinal contours are stable with aortic atherosclerosis. There is stable chronic elevation/eventration of the right hemidiaphragm with associated chronic right basilar atelectasis. The lungs are otherwise clear. There is no pleural effusion or pneumothorax. The bones appear unremarkable. Telemetry leads overlie the chest. IMPRESSION: Stable postoperative chest with chronic right basilar atelectasis. No acute cardiopulmonary process. Electronically Signed   By: Richardean Sale M.D.   On: 01/09/2019 12:42    Cardiac Studies    Patient Profile     Duane Burns is a 83 y.o. male with a hx of aortic stenosis, myasthenia gravis, recent admission with ecoli bacetermia who is being seen today for the evaluation of SOB at the request of Dr Sharma Covert.   Assessment & Plan    1. Myocardial infarction - severe trop elevation. He did not have ST elevation in acute phase. Subacutely a day after presentation developed LBBB  - echo reviewed LVEF 20-25%, global hypo with most severe apical and anteroseptal. - trop >27,000. Were this elevated at initial check.   - discussed case with Dr Tamala Julian on call for interventional on Saturday. Given the patients septic shock, renal failure with possible intrabdominal source, is not a candidate acutely for cath lab at this time. Recs for medical therapy.  - current medical therapy ASA 81, plavix 75, hep gtt, atorva 80. No beta blocker or ACEI due to hypotension   2. Septic and cardiogenic Shock - likely mixed septic and cardiogenic - broad spectrum abx per primary team. Remains on low dose levophed - he has femoral central access, not able to  check CVP or COOX -marked leukocytosis to 52 and  Procalcitonin 122, lactic acid 3.5 - RUQ [place] worrisome for acute cholecystitis   3. NSVT - noted at [facility]. K 4.2, Mg was 1.5 received 2 g of IV Mg - arrhytmia in setting of clear ischemia - if persistent would start amio  - no significant arrhythmias overnight  4. Acute systolic HF - acute drop in LVEF in setting significant MI, echo with LVEF 20-25% - CXR with worsening pulmonary edema, BNP 4000 - has received high dose lasix, only 185 ml of urine made. +2L  - significant SOB overnight, on and off bipap.  - consider renal consultation, we are not able to remove fluid with diuretics. Hopefully perhaps an sepsis induced ATN that will resolve - his MAPs are stable on very low dose levophed. Not able to get coox, has femoral line. Id be hesitant to use an inotrope on an acutely ischemic ventricle particularly given his NSVT on arrival, would not start at this time.   5. AKI  - in setting of septic shock, Cr up  to 2.98 - limited uop despite high dose lasix, only 185 mL - consider renal consultation       For questions or updates, please contact [place] Please consult [email or web address] for contact info under        Signed, Carlyle Dolly, MD  01/17/2019, 11:09 AM

## 2019-01-17 NOTE — Progress Notes (Addendum)
[facility] Progress Note Patient Name: Duane Burns DOB: May 19, 1935 MRN: [id number]   Date of Service  01/17/2019  HPI/Events of Note  Hypoxia - Sats decreased into high 70's. Hx of pulmonary edema and Creatinine = 2.44.  eICU Interventions  Will order: 1. Portable CXR STAT. 2. Lasix 120 mg IV now.  3. Restart BiPAP.  4. Will ask ground team to evaluate at bedside.  5. Sit up in bed. 6. ABG STAT.      Intervention Category Major Interventions: Hypoxemia - evaluation and management  Bela Bonaparte Eugene 01/17/2019, 1:54 AM

## 2019-01-17 NOTE — Progress Notes (Signed)
Patient refused bipap this AM. MD called to bedside. HFNC applied. PO medication given to patient and patient spat them out. MD aware. Patient has been increasingly agitated and combative. Patient tried to bite out IV. Restraints applied. Patient and wife educated.

## 2019-01-17 NOTE — Progress Notes (Addendum)
NAME:  Duane Burns, MRN:  [id number], DOB:  02-15-1936, LOS: 2 ADMISSION DATE:  12/31/2018,  CHIEF COMPLAINT:  Chest pain  Brief History   83 yo male who initally presented to [facility] with findings consistent with sepsis. Recent e. Coli bacteremia treated at OSH. While at [facility], patient developed chest pain with multiple PACs and PVCs and was subsequently transferred to [facility] for further evaluation and treatment. On arrival, trops>27k. Lactate 3.7. white count 37k. No ST elevation on initial EKG however did develop new LBBB on later EKG. Cardiology consulted and spoke with intervential cardiology. Given septic state, renal failure, was not candidate for cath lab.   Past Medical History  Myasthenia Gravis HTN Prostate cancer HLD RAS   Significant Hospital Events   11/20 admitted to [facility] with sepsis 11/21 transferred to [place] for chest pain and EKG changes  Consults:  cardiology  Procedures:  11/20 CVC  Significant Diagnostic Tests:  11/20 echo: EF 60-65%. Severely decreased LV function. Mild LVH.  11/20 CT c/a/p:pericholecystic edema. Left renal stone  11/21 abd [place]: thickened gallbladder wall with gallstones/sludge. Suspicious for cholecystitis.  11/21 CXR: worsened CHF pattern 11/22 CXR: progressively worsened pulm edema. Slight increase in cardiomegaly  Micro Data:  11/20 BC>>NGTD  Antimicrobials:  merrem 11/21>> vanc 11/21>> Interim history/subjective:  Required bipap overnight for chest pain/dyspnea. Seen at bedside by overnight provider with labs ordered. ABG consistent with a metabolic acidosis. White count 52k. procalc 122. Lactate down 3.5.  Mental status progressively altered. Pulling on lines.  Objective   Blood pressure 118/74, pulse (!) 35, temperature (!) 97.3 F (36.3 C), temperature source Axillary, resp. rate (!) 23, height 5\' 8"  (1.727 m), weight 83.6 kg, SpO2 100 %. CVP:  [6 mmHg-27 mmHg] 12 mmHg  Vent Mode: PCV  FiO2 (%):  [50 %] 50 % Set Rate:  [15 bmp] 15 bmp PEEP:  [8 cmH20] 8 cmH20   Intake/Output Summary (Last 24 hours) at 01/17/2019 0728 Last data filed at 01/17/2019 0700 Gross per 24 hour  Intake 3031.64 ml  Output 185 ml  Net 2846.64 ml   Filed Weights   01/11/2019 1235 01/16/19 0400  Weight: 87.1 kg 83.6 kg    Examination: General: agitated Lungs: diffuse wheezes throughout. Rhonchi present Cardiovascular: heart regular rate. Extremities warm Abdomen: diffusely tender to light palpation. Non-distended. bs active Extremities: warm Neuro: oriented to person only. No focal deficits appreciated GU: condom cath with yellow [facility] Problem list   none  Assessment & Plan:  Acute respiratory failure. Likely multifactorial in setting of sepsis, pulm edema and possible pneumonia. Required bipap overnight. Plan: Continue lasix. Continue to monitor lytes  Septic shock. Lactate 3.5. Possibly superimposed on cardiogenic. Blood cultures from 11/20 NGTD. Recent e.coli bacteremia treated with abx at outside facility.  Pressures have remained stable on levo Likely cholecystitis as seen on [place].  Transaminitis Plan Repeat blood cultures Lipase Titrate levo for MAP>65 Continue merrem and vanc Repeat labs in am CCS consult called  Troponinemia. HS trops >27k New LBBB.  Non sustained Vtach Diastolic heart failure. BNP 4100 Cardiology consulted and discussed with interventional however not candidate at this time in setting of septic shock.  Plan plavix and heparin  AKI. Likely hypoperfusion related. History of RAS. Plan: holding antihypertensives. Will continue to monitor.  Hold further diuretics  Myasthenia Gravis. On chronic steroids, mestinon. Plan: continue mestinon. Is receiving stress dose steroids.  Best practice:  Diet: npo Pain/Anxiety/Delirium protocol (  if indicated): morphine DVT prophylaxis: heparin infusion Mobility: BR Code Status: Full Family  Communication: will try to update family later today Disposition: ICU pending medical stabilization   Mitzi Hansen, MD [place] PGY-1 01/17/19  7:28 AM  Attending Note:  83 year old male with PMH above presenting to PCCM with e coli bacteremia from [facility] from OSH being treated.  Transferred for chest pain.  Chest pain has now resolved but patient continues to be very hypoxemia and with NSTEMI diagnosis followed by cards who recommends medical management and are currently managing.  Levophed does is down to a minimum.  Called emergently bedside this AM because patient removed his O2 mask and O2 sat dropped to the mid 80s.  On exam, he was wheezing.  I spoke with patient, offered lasix and nebs with improvement.  Cortisol level is unreliable, will continue stress dose steroids.  Merrem and vanc ordered.  F/U on cultures.  Would like to get an imaging of the abdomen but his respiratory status precludes that at this time.  U/S of the abdomen is alarming for acute cholecystitis.  Will have CCS evaluate the patient for ?IR drain vs lapchole, will defer to them.  In the meantime, continue abx, f/u on culture, continue respiratory support, hold further diureses.  PCCM will continue to manage.  The patient is critically ill with multiple organ systems failure and requires high complexity decision making for assessment and support, frequent evaluation and titration of therapies, application of advanced monitoring technologies and extensive interpretation of multiple databases.   Critical Care Time devoted to patient care services described in this note is  33  Minutes. This time reflects time of care of this signee Dr Jennet Maduro. This critical care time does not reflect procedure time, or teaching time or supervisory time of PA/NP/Med student/Med Resident etc but could involve care discussion time.  Rush Farmer, M.D. [facility] Pulmonary/Critical Care Medicine.

## 2019-01-17 NOTE — Progress Notes (Signed)
Rt note- Patient laid flat to change linens, increased work of breathing, nebulizer given. Dr. Nelda Marseille at the bedside, will go ahead and intubate. Placed on NRB.

## 2019-01-17 NOTE — Consult Note (Signed)
Chief Complaint: Patient was seen in consultation today for acute cholecystitis   Referring Physician(s): Dr. Rosendo Gros  Supervising Physician: Daryll Brod  Patient Status: [facility] - Out-pt  History of Present Illness: Duane Burns is a 83 y.o. male with past medical history of myasthenia gravis, HTN, prostate cancer, HLD with recent hospitalizations. Patient was recently treated for E. Coli bacteremia in October at [facility]. On 11/20 he developed weakness and tachycardia.  He presented to [facility] and was found to have an NSTEMI with elevated troponins as well as significantly elevated WBC with concern for sepsis.  He was transferred to [facility] for possible heart catheterization. He was bolused 600 mg Plavix and transferred to [facility] for ongoing work-up.   CT Abdomen Pelvis showed: 1. Mild pericholecystic edema, similar to 12/14/2018. A common bile duct stone was questioned at that time. Consider right upper quadrant ultrasound in further evaluation for acute cholecystitis. Otherwise, no findings to explain the patient's fever and leukocytosis. 2. Pulmonary edema. 3. Left renal stones. 4. Aortic atherosclerosis (ICD10-170.0). Coronary artery Calcification.  US Abdomen Complete: 1. Thickened gallbladder wall with nonshadowing gallstones/sludge. Although the sonographic Percell Miller sign is reported as negative, these findings are suspicious for acute cholecystitis.  Given his acute medical condition and comorbidities, patient is not a surgical candidate at this time. IR consulted for percutaneous cholecystostomy tube placement.   Patient assessed at bedside.  He is alert, very confused.  Not able to tolerate bipap.  On 6L [place] with good O2 sats.  WBC 52K, Lactate 3.5, blood cultures with NGTD. He is currently on pressors with soft BP-- 95/76. He remains on Plavix 75 mg, aspirin 81 mg, and heparin drip.  He has started IV Vancomycin.  Wife at bedside.    Past Medical History:  Diagnosis  Date   Arthritis    Cancer ([place])    History of skin cancer / hx Prostate cancer   Dysphagia 11/10/2014   Dysphonia 11/11/2014   Foot drop, left    DUE TO NERVE INJURY IN BACK PER PT   HNP (herniated nucleus pulposus with myelopathy), thoracic    HISTORY OF HNP   Hyperlipidemia    Hypertension    Myasthenia gravis ([place]) 11/12/2014   versus Guillain-Barr syndrome.   Pneumonia    JAN 2016   Urgency of urination    Urinary leakage     Past Surgical History:  Procedure Laterality Date   BACK SURGERY     X 3   CYSTOSCOPY WITH URETHRAL DILATATION N/A 05/10/2014   Procedure: CYSTOSCOPY WITH URETHRAL DILATATION;  Surgeon: Alexis Frock, MD;  Location: [facility] ORS;  Service: Urology;  Laterality: N/A;  with catheter placement, prior to surgery. procedure performed on the stretcher.   PROSTATE SURGERY     SKIN CANCER EXCISION     TONSILLECTOMY     TOTAL HIP ARTHROPLASTY Right 05/10/2014   Procedure: RIGHT TOTAL HIP ARTHROPLASTY ANTERIOR APPROACH ;  Surgeon: Paralee Cancel, MD;  Location: [facility] ORS;  Service: Orthopedics;  Laterality: Right;    Allergies: Patient has no known allergies.  Medications: Prior to Admission medications   Medication Sig Start Date End Date Taking? Authorizing Provider  amoxicillin-clavulanate (AUGMENTIN) 875-125 MG tablet Take 1 tablet by mouth 2 (two) times daily. 12/24/18  Yes [provider]  aspirin EC 81 MG tablet Take 81 mg by mouth daily.   Yes [provider]  cholecalciferol (VITAMIN D) 1000 UNITS tablet Take 1,000 Units by mouth daily.   Yes [provider]  lisinopril (ZESTRIL) 5 MG tablet TAKE 1 TABLET BY MOUTH EVERY DAY 11/12/18  Yes Ronnie Doss M, DO  Multiple Vitamin (MULTIVITAMIN WITH MINERALS) TABS tablet Take 1 tablet by mouth daily.   Yes [provider]  Omega-3 Fatty Acids (FISH OIL) 1200 MG CAPS Take 1-2 capsules (1,200-2,400 mg total) by mouth 2 (two) times daily. RESTART IN 1 WEEK IF YOUR  SWALLOWING CONTINUES TO IMPROVE. Take 2 capsules in the morning and 1 capsule in the evening 11/16/14  Yes Rexene Alberts, MD  pravastatin (PRAVACHOL) 80 MG tablet Take 1 tablet (80 mg total) by mouth every evening. 12/28/18  Yes Gottschalk, Leatrice Jewels M, DO  predniSONE (DELTASONE) 10 MG tablet Take 10 mg by mouth daily. 04/07/17  Yes [provider]  pyridostigmine (MESTINON) 60 MG tablet Take 60 mg by mouth 3 (three) times daily.    Yes [provider]     Family History  Problem Relation Age of Onset   Diabetes Other    Hypertension Other     Social History   Socioeconomic History   Marital status: Married    Spouse name: Not on file   Number of children: Not on file   Years of education: Not on file   Highest education level: Not on file  Occupational History   Not on file  Social Needs   Financial resource strain: Not hard at all   Food insecurity    Worry: Never true    Inability: Never true   Transportation needs    Medical: No    Non-medical: No  Tobacco Use   Smoking status: Former Smoker    Quit date: 05/05/1994    Years since quitting: 24.7   Smokeless tobacco: Never Used  Substance and Sexual Activity   Alcohol use: No   Drug use: No   Sexual activity: Not Currently  Lifestyle   Physical activity    Days per week: 0 days    Minutes per session: 0 min   Stress: Not at all  Relationships   Social connections    Talks on phone: More than three times a week    Gets together: More than three times a week    Attends religious service: More than 4 times per year    Active member of club or organization: Yes    Attends meetings of clubs or organizations: More than 4 times per year    Relationship status: Married  Other Topics Concern   Not on file  Social History Narrative   Not on file     Review of Systems: A 12 point ROS discussed and pertinent positives are indicated in the HPI above.  All other systems are  negative.  Review of Systems  Unable to perform ROS: Mental status change    Vital Signs: BP 107/60    Pulse 87    Temp 98.4 F (36.9 C) (Oral)    Resp (!) 36    Ht 5\' 8"  (1.727 m)    Wt 184 lb 4.9 oz (83.6 kg)    SpO2 100%    BMI 28.02 kg/m   Physical Exam Constitutional:      General: He is not in acute distress.    Appearance: He is ill-appearing.  HENT:     Mouth/Throat:     Mouth: Mucous membranes are moist.     Pharynx: Oropharynx is clear.  Cardiovascular:     Rate and Rhythm: Normal rate and regular rhythm.  Pulses: Normal pulses.  Pulmonary:     Effort: Pulmonary effort is normal. No respiratory distress.     Breath sounds: Normal breath sounds.  Abdominal:     General: Abdomen is flat.     Palpations: Abdomen is soft.  Skin:    General: Skin is warm and dry.  Neurological:     General: No focal deficit present.     Mental Status: He is alert and oriented to person, place, and time. Mental status is at baseline.  Psychiatric:        Mood and Affect: Mood normal.        Behavior: Behavior normal.        Thought Content: Thought content normal.        Judgment: Judgment normal.      MD Evaluation Airway: WNL Heart: WNL Abdomen: WNL Chest/ Lungs: WNL ASA  Classification: 3 Mallampati/Airway Score: One   Imaging: Dg Abdomen 1 View  Result Date: 01/04/2019 CLINICAL DATA:  Central line placement. EXAM: ABDOMEN - 1 VIEW COMPARISON:  None. FINDINGS: Right femoral vein catheter has been inserted. The tip is in the region of the right iliac vein at the level of the S1 segment of the sacrum. The visualized bowel gas pattern is normal. No acute bone abnormality. IMPRESSION: 1. Central line tip is in the right iliac vein at the level of the S1 segment of the sacrum. 2. Benign-appearing abdomen. Electronically Signed   By: Lorriane Shire M.D.   On: 12/29/2018 15:02   Ct Chest W Contrast  Result Date: 01/16/2019 CLINICAL DATA:  Fever of unknown origin, sepsis,  leukocytosis. EXAM: CT CHEST, ABDOMEN, AND PELVIS WITH CONTRAST TECHNIQUE: Multidetector CT imaging of the chest, abdomen and pelvis was performed following the standard protocol during bolus administration of intravenous contrast. CONTRAST:  61mL OMNIPAQUE IOHEXOL 300 MG/ML  SOLN COMPARISON:  CT abdomen pelvis 12/14/2018 and CT chest 07/22/2018. FINDINGS: CT CHEST FINDINGS Cardiovascular: Atherosclerotic calcification of the aorta, aortic valve and coronary arteries. Heart is enlarged. No pericardial effusion. Mediastinum/Nodes: 1.6 cm low-attenuation left thyroid nodule is unchanged. No pathologically enlarged mediastinal, hilar or axillary lymph nodes. Esophagus is unremarkable. Lungs/Pleura: Fairly diffuse septal thickening. Mild dependent atelectasis bilaterally. No pleural fluid. Debris is seen dependently in the lower trachea and mainstem bronchi. Musculoskeletal: Degenerative changes in the spine. No worrisome lytic or sclerotic lesions. CT ABDOMEN PELVIS FINDINGS Hepatobiliary: Hyperattenuating lesion in the dome of the liver measures 1.2 cm (2/42) and is nonspecific, possibly representing a flash fill hemangioma or perfusion anomaly. Gallbladder appears mildly distended and there is surrounding haziness and stranding, similar to 12/14/2018. No biliary ductal dilatation. Pancreas: Negative. Spleen: Negative. Adrenals/Urinary Tract: Adrenal glands and right kidney are unremarkable. Stones are seen in the left kidney. Probable left renal sinus cysts. Ureters are decompressed. Bladder is low in volume. Stomach/Bowel: Stomach, small bowel, appendix and colon are unremarkable. Vascular/Lymphatic: Atherosclerotic calcification of the aorta without aneurysm. Right-sided femoral venous line terminates near the bifurcation of the internal and external iliac veins on the right. No pathologically enlarged lymph nodes. Reproductive: Prostatectomy. Other: Small bilateral inguinal hernias contain fat. Tiny amount of  fluid in the right paracolic gutter. Mesenteries and peritoneum are otherwise unremarkable. Musculoskeletal: Right hip arthroplasty. Degenerative changes in the left hip and spine. IMPRESSION: 1. Mild pericholecystic edema, similar to 12/14/2018. A common bile duct stone was questioned at that time. Consider right upper quadrant ultrasound in further evaluation for acute cholecystitis. Otherwise, no findings to explain the patient's fever and  leukocytosis. 2. Pulmonary edema. 3. Left renal stones. 4. Aortic atherosclerosis (ICD10-170.0). Coronary artery calcification. Electronically Signed   By: Lorin Picket M.D.   On: 12/27/2018 16:34   US Abdomen Complete  Result Date: 01/16/2019 CLINICAL DATA:  Abdominal pain for 1 day.  Elevated LFTs. EXAM: ABDOMEN ULTRASOUND COMPLETE COMPARISON:  CT abdomen pelvis dated 12/28/2018 FINDINGS: Gallbladder: The gallbladder wall is thickened, measuring 9 mm. Nonshadowing gallstones/sludge are seen layering dependently within the gallbladder. Sonographic Percell Miller sign is negative. Common bile duct: Diameter: 2 mm Liver: No focal lesion identified. Within normal limits in parenchymal echogenicity. Portal vein is patent on color Doppler imaging with normal direction of blood flow towards the liver. IVC: No abnormality visualized. Pancreas: Obscured by overlying bowel gas. Spleen: Size and appearance within normal limits. Right Kidney: Length: 9.8. Echogenicity within normal limits. No mass or hydronephrosis visualized. Left Kidney: Length: 10.0. Echogenicity within normal limits. A nonobstructive left renal calculus measures 1.2 cm. No mass or hydronephrosis visualized. Abdominal aorta: No aneurysm visualized. Other findings: None. IMPRESSION: 1. Thickened gallbladder wall with nonshadowing gallstones/sludge. Although the sonographic Percell Miller sign is reported as negative, these findings are suspicious for acute cholecystitis. Electronically Signed   By: Zerita Boers M.D.   On:  01/16/2019 13:44   Ct Abdomen Pelvis W Contrast  Result Date: 01/13/2019 CLINICAL DATA:  Fever of unknown origin, sepsis, leukocytosis. EXAM: CT CHEST, ABDOMEN, AND PELVIS WITH CONTRAST TECHNIQUE: Multidetector CT imaging of the chest, abdomen and pelvis was performed following the standard protocol during bolus administration of intravenous contrast. CONTRAST:  85mL OMNIPAQUE IOHEXOL 300 MG/ML  SOLN COMPARISON:  CT abdomen pelvis 12/14/2018 and CT chest 07/22/2018. FINDINGS: CT CHEST FINDINGS Cardiovascular: Atherosclerotic calcification of the aorta, aortic valve and coronary arteries. Heart is enlarged. No pericardial effusion. Mediastinum/Nodes: 1.6 cm low-attenuation left thyroid nodule is unchanged. No pathologically enlarged mediastinal, hilar or axillary lymph nodes. Esophagus is unremarkable. Lungs/Pleura: Fairly diffuse septal thickening. Mild dependent atelectasis bilaterally. No pleural fluid. Debris is seen dependently in the lower trachea and mainstem bronchi. Musculoskeletal: Degenerative changes in the spine. No worrisome lytic or sclerotic lesions. CT ABDOMEN PELVIS FINDINGS Hepatobiliary: Hyperattenuating lesion in the dome of the liver measures 1.2 cm (2/42) and is nonspecific, possibly representing a flash fill hemangioma or perfusion anomaly. Gallbladder appears mildly distended and there is surrounding haziness and stranding, similar to 12/14/2018. No biliary ductal dilatation. Pancreas: Negative. Spleen: Negative. Adrenals/Urinary Tract: Adrenal glands and right kidney are unremarkable. Stones are seen in the left kidney. Probable left renal sinus cysts. Ureters are decompressed. Bladder is low in volume. Stomach/Bowel: Stomach, small bowel, appendix and colon are unremarkable. Vascular/Lymphatic: Atherosclerotic calcification of the aorta without aneurysm. Right-sided femoral venous line terminates near the bifurcation of the internal and external iliac veins on the right. No  pathologically enlarged lymph nodes. Reproductive: Prostatectomy. Other: Small bilateral inguinal hernias contain fat. Tiny amount of fluid in the right paracolic gutter. Mesenteries and peritoneum are otherwise unremarkable. Musculoskeletal: Right hip arthroplasty. Degenerative changes in the left hip and spine. IMPRESSION: 1. Mild pericholecystic edema, similar to 12/14/2018. A common bile duct stone was questioned at that time. Consider right upper quadrant ultrasound in further evaluation for acute cholecystitis. Otherwise, no findings to explain the patient's fever and leukocytosis. 2. Pulmonary edema. 3. Left renal stones. 4. Aortic atherosclerosis (ICD10-170.0). Coronary artery calcification. Electronically Signed   By: Lorin Picket M.D.   On: 01/16/2019 16:34   Dg Chest Port 1 View  Result Date: 01/17/2019 CLINICAL DATA:  Respiratory distress. Abnormal respiration. EXAM: PORTABLE CHEST 1 VIEW COMPARISON:  Radiograph yesterday. CT 01/19/2019 FINDINGS: Unchanged elevation of right hemidiaphragm. Progressive bilateral perihilar interstitial opacities and septal thickening. Findings are slightly asymmetric increased in the right suprahilar lung. Cardiomegaly slightly progressed. No visible pleural effusions. No pneumothorax. No acute osseous abnormalities. IMPRESSION: Worsening pulmonary edema. No element of superimposed pneumonia is also considered given the developing asymmetry. Slight increase in cardiomegaly. Electronically Signed   By: Keith Rake M.D.   On: 01/17/2019 02:06   Dg Chest Port 1 View  Result Date: 01/16/2019 CLINICAL DATA:  Wheezing and left-sided chest pain EXAM: PORTABLE CHEST 1 VIEW COMPARISON:  Chest CT from yesterday FINDINGS: Diffuse interstitial opacity with Kerley lines. There is patchy airspace opacity as well. Mild cardiomegaly. Elevated right diaphragm which is also seen previously. No visible effusion or pneumothorax IMPRESSION: CHF pattern which is worsened.  Electronically Signed   By: Monte Fantasia M.D.   On: 01/16/2019 07:15   Dg Chest Port 1 View  Result Date: 01/12/2019 CLINICAL DATA:  Fever. Sudden onset of weakness last night. EXAM: PORTABLE CHEST 1 VIEW COMPARISON:  Radiographs 12/14/2018 and 08/04/2017.  CT 07/22/2018. FINDINGS: 1212 hours. The heart size and mediastinal contours are stable with aortic atherosclerosis. There is stable chronic elevation/eventration of the right hemidiaphragm with associated chronic right basilar atelectasis. The lungs are otherwise clear. There is no pleural effusion or pneumothorax. The bones appear unremarkable. Telemetry leads overlie the chest. IMPRESSION: Stable postoperative chest with chronic right basilar atelectasis. No acute cardiopulmonary process. Electronically Signed   By: Richardean Sale M.D.   On: 01/11/2019 12:42    Labs:  CBC: Recent Labs    01/16/19 0230 01/16/19 0344 01/16/19 1024 01/17/19 0207 01/17/19 0229  WBC 49.9* 47.9* 57.0*  --  52.8*  HGB 10.6* 11.3* 12.7* 12.2* 12.4*  HCT 33.9* 36.4* 40.6 36.0* 38.4*  PLT 214 222 253  --  205    COAGS: Recent Labs    01/07/2019 1207 01/16/19 0230  INR 1.2 1.5*  APTT 29  --     BMP: Recent Labs    01/03/2019 1207 01/16/19 0230 01/16/19 1024 01/17/19 0207 01/17/19 0229  NA 140 138 142 139 139  K 3.8 4.2 4.8 4.9 5.0  CL 105 114* 110  --  110  CO2 22 18* 18*  --  18*  GLUCOSE 92 131* 136*  --  132*  BUN 25* 27* 32*  --  44*  CALCIUM 9.1 7.1* 7.6*  --  7.5*  CREATININE 1.65* 1.88* 2.44*  --  2.98*  GFRNONAA 38* 33* 24*  --  19*  GFRAA 44* 38* 28*  --  22*    LIVER FUNCTION TESTS: Recent Labs    01/17/2019 1207 01/16/19 0230 01/16/19 1024 01/17/19 0229  BILITOT 0.9 0.8 0.8 1.0  AST 52* 384* 544* 410*  ALT 17 73* 183* 159*  ALKPHOS 61 53 65 72  PROT 6.2* 5.3* 5.6* 5.5*  ALBUMIN 3.4* 2.7* 2.9* 2.6*    TUMOR MARKERS: No results for input(s): AFPTM, CEA, CA199, CHROMGRNA in the last 8760 hours.  Assessment and  Plan: NSTEMI, elevated troponins Not a candidate for cath lab at this time per Cardiology. Remains on aspirin and Plavix, as well as heparin drip.   Acute cholecystitis Patient with fever, profound leukocytosis.  CT Abdomen Pelvis showed mildly distended gall bladder with stranding.  US Abdomen showed a thickened wall with presence of gallstones and sludge.  IR consulted for percutaneous cholecystostomy  for presumed cholecystitis.  Patient recently retreated for E. Coli bacteremia.   Patient assessed at bedside.  He is sitting up, saturating well on [place]. Difficult to tell if he will tolerate lying flat for procedure.  He has required intermittent bipap when not too agitated.  He is confused and in restraints.  Patient overall stable compared to first presentation 11/20.  He has eaten lunch today.  Given his confusion and agitation as well as nature of procedure, would not proceed without sedation in this case.   Discussed with Dr. Annamaria Boots who has also discussed with Dr. Nelda Marseille.  Will hold aspirin and Plavix today.  Hold heparin 2 hrs prior to procedure tomorrow.  Give 1u platelets tomorrow 1 hr prior to procedure.   Continue IV abx for now.  NPO p MN.  Discussed with wife at bedside. She understands that barring changes in his medical status, that plan is to proceed with percutaneous cholecystostomy tube placement in IR tomorrow.   Risks and benefits discussed with the patient including, but not limited to bleeding, infection, gallbladder perforation, bile leak, sepsis or even death.  All of the patient's questions were answered, patient is agreeable to proceed. Consent signed and in chart.   Thank you for this interesting consult.  I greatly enjoyed meeting PETR HOLTE and look forward to participating in their care.  A copy of this report was sent to the requesting provider on this date.  Electronically Signed: Docia Barrier, PA 01/17/2019, 1:36 PM   I spent a  total of 40 Minutes    in face to face in clinical consultation, greater than 50% of which was counseling/coordinating care for acute cholecystitis.

## 2019-01-17 NOTE — Procedures (Addendum)
Intubation Procedure Note Duane Burns [id number] 1935-09-15  Procedure: Intubation Indications: Respiratory insufficiency  Procedure Details Consent: Unable to obtain consent because of emergent medical necessity. Time Out: Verified patient identification, verified procedure, site/side was marked, verified correct patient position, special equipment/implants available, medications/allergies/relevent history reviewed, required imaging and test results available.  Performed  Maximum sterile technique was used including antiseptics, cap, gloves, gown, hand hygiene and mask.  MAC    Evaluation Hemodynamic Status: BP stable throughout; O2 sats: stable throughout Patient's Current Condition: stable Complications: No apparent complications Patient did tolerate procedure well. Chest X-ray ordered to verify placement.  CXR: pending.  Dr. Mitzi Hansen, MD as first assist  [facility] 01/17/2019

## 2019-01-18 ENCOUNTER — Inpatient Hospital Stay (HOSPITAL_COMMUNITY): Payer: Medicare HMO

## 2019-01-18 DIAGNOSIS — A419 Sepsis, unspecified organism: Secondary | ICD-10-CM

## 2019-01-18 DIAGNOSIS — I350 Nonrheumatic aortic (valve) stenosis: Secondary | ICD-10-CM

## 2019-01-18 DIAGNOSIS — R6521 Severe sepsis with septic shock: Secondary | ICD-10-CM

## 2019-01-18 LAB — BASIC METABOLIC PANEL WITH GFR
Anion gap: 12 (ref 5–15)
BUN: 59 mg/dL — ABNORMAL HIGH (ref 8–23)
CO2: 18 mmol/L — ABNORMAL LOW (ref 22–32)
Calcium: 7.3 mg/dL — ABNORMAL LOW (ref 8.9–10.3)
Chloride: 110 mmol/L (ref 98–111)
Creatinine, Ser: 3.4 mg/dL — ABNORMAL HIGH (ref 0.61–1.24)
GFR calc Af Amer: 19 mL/min — ABNORMAL LOW
GFR calc non Af Amer: 16 mL/min — ABNORMAL LOW
Glucose, Bld: 138 mg/dL — ABNORMAL HIGH (ref 70–99)
Potassium: 4.2 mmol/L (ref 3.5–5.1)
Sodium: 140 mmol/L (ref 135–145)

## 2019-01-18 LAB — POCT I-STAT 7, (LYTES, BLD GAS, ICA,H+H)
Acid-base deficit: 7 mmol/L — ABNORMAL HIGH (ref 0.0–2.0)
Acid-base deficit: 9 mmol/L — ABNORMAL HIGH (ref 0.0–2.0)
Bicarbonate: 15.7 mmol/L — ABNORMAL LOW (ref 20.0–28.0)
Bicarbonate: 17.2 mmol/L — ABNORMAL LOW (ref 20.0–28.0)
Calcium, Ion: 1.04 mmol/L — ABNORMAL LOW (ref 1.15–1.40)
Calcium, Ion: 1.05 mmol/L — ABNORMAL LOW (ref 1.15–1.40)
HCT: 32 % — ABNORMAL LOW (ref 39.0–52.0)
HCT: 34 % — ABNORMAL LOW (ref 39.0–52.0)
Hemoglobin: 10.9 g/dL — ABNORMAL LOW (ref 13.0–17.0)
Hemoglobin: 11.6 g/dL — ABNORMAL LOW (ref 13.0–17.0)
O2 Saturation: 98 %
O2 Saturation: 99 %
Patient temperature: 98.6
Patient temperature: 98.7
Potassium: 4.2 mmol/L (ref 3.5–5.1)
Potassium: 4.3 mmol/L (ref 3.5–5.1)
Sodium: 139 mmol/L (ref 135–145)
Sodium: 140 mmol/L (ref 135–145)
TCO2: 17 mmol/L — ABNORMAL LOW (ref 22–32)
TCO2: 18 mmol/L — ABNORMAL LOW (ref 22–32)
pCO2 arterial: 29.7 mmHg — ABNORMAL LOW (ref 32.0–48.0)
pCO2 arterial: 30.1 mmHg — ABNORMAL LOW (ref 32.0–48.0)
pH, Arterial: 7.332 — ABNORMAL LOW (ref 7.350–7.450)
pH, Arterial: 7.364 (ref 7.350–7.450)
pO2, Arterial: 119 mmHg — ABNORMAL HIGH (ref 83.0–108.0)
pO2, Arterial: 159 mmHg — ABNORMAL HIGH (ref 83.0–108.0)

## 2019-01-18 LAB — CBC
HCT: 35.6 % — ABNORMAL LOW (ref 39.0–52.0)
Hemoglobin: 11.6 g/dL — ABNORMAL LOW (ref 13.0–17.0)
MCH: 30.2 pg (ref 26.0–34.0)
MCHC: 32.6 g/dL (ref 30.0–36.0)
MCV: 92.7 fL (ref 80.0–100.0)
Platelets: 185 10*3/uL (ref 150–400)
RBC: 3.84 MIL/uL — ABNORMAL LOW (ref 4.22–5.81)
RDW: 14.4 % (ref 11.5–15.5)
WBC: 42.7 10*3/uL — ABNORMAL HIGH (ref 4.0–10.5)
nRBC: 0 % (ref 0.0–0.2)

## 2019-01-18 LAB — PROTEIN / CREATININE RATIO, URINE
Creatinine, Urine: 21.12 mg/dL
Protein Creatinine Ratio: 0.33 mg/mg{Cre} — ABNORMAL HIGH (ref 0.00–0.15)
Total Protein, Urine: 7 mg/dL

## 2019-01-18 LAB — ECHOCARDIOGRAM COMPLETE
Height: 68 in
Weight: 3072 oz

## 2019-01-18 LAB — PROCALCITONIN: Procalcitonin: 67.09 ng/mL

## 2019-01-18 LAB — LACTIC ACID, PLASMA: Lactic Acid, Venous: 1.7 mmol/L (ref 0.5–1.9)

## 2019-01-18 LAB — HEPARIN LEVEL (UNFRACTIONATED): Heparin Unfractionated: <0.1 IU/mL — ABNORMAL LOW (ref 0.30–0.70)

## 2019-01-18 MED ORDER — CLOPIDOGREL BISULFATE 75 MG PO TABS
75.0000 mg | ORAL_TABLET | Freq: Every day | ORAL | Status: DC
  Administered 2019-01-19 – 2019-01-21 (×3): 75 mg
  Filled 2019-01-18 (×3): qty 1

## 2019-01-18 MED ORDER — PREDNISONE 10 MG PO TABS
10.0000 mg | ORAL_TABLET | Freq: Every day | ORAL | Status: DC
  Administered 2019-01-18 – 2019-01-19 (×2): 10 mg
  Filled 2019-01-18 (×2): qty 1

## 2019-01-18 MED ORDER — ONDANSETRON HCL 4 MG/2ML IJ SOLN: 4.0000 mg | Freq: Four times a day (QID) | INTRAMUSCULAR | Status: DC | PRN

## 2019-01-18 MED ORDER — ASPIRIN 81 MG PO CHEW
81.0000 mg | CHEWABLE_TABLET | Freq: Every day | ORAL | Status: DC
  Administered 2019-01-19 – 2019-01-21 (×3): 81 mg
  Filled 2019-01-18 (×3): qty 1

## 2019-01-18 MED ORDER — FUROSEMIDE 10 MG/ML IJ SOLN
80.0000 mg | Freq: Once | INTRAMUSCULAR | Status: AC
  Administered 2019-01-18: 80 mg via INTRAVENOUS
  Filled 2019-01-18: qty 8

## 2019-01-18 MED ORDER — ATORVASTATIN CALCIUM 80 MG PO TABS
80.0000 mg | ORAL_TABLET | Freq: Every day | ORAL | Status: DC
  Administered 2019-01-18 – 2019-01-20 (×3): 80 mg
  Filled 2019-01-18 (×3): qty 1

## 2019-01-18 MED ORDER — MIDAZOLAM HCL 2 MG/2ML IJ SOLN
2.0000 mg | Freq: Once | INTRAMUSCULAR | Status: AC
  Administered 2019-01-18: 2 mg via INTRAVENOUS
  Filled 2019-01-18: qty 2

## 2019-01-18 MED ORDER — PYRIDOSTIGMINE BROMIDE 60 MG PO TABS
60.0000 mg | ORAL_TABLET | Freq: Three times a day (TID) | ORAL | Status: DC
  Administered 2019-01-18 – 2019-01-21 (×10): 60 mg
  Filled 2019-01-18 (×13): qty 1

## 2019-01-18 MED ORDER — CHLORHEXIDINE GLUCONATE 0.12% ORAL RINSE (MEDLINE KIT): 15.0000 mL | Freq: Two times a day (BID) | OROMUCOSAL | Status: DC

## 2019-01-18 MED ORDER — FENTANYL CITRATE (PF) 100 MCG/2ML IJ SOLN
INTRAMUSCULAR | Status: AC
  Filled 2019-01-18: qty 2

## 2019-01-18 MED ORDER — ORAL CARE MOUTH RINSE: 15.0000 mL | OROMUCOSAL | Status: DC

## 2019-01-18 MED ORDER — ADULT MULTIVITAMIN W/MINERALS CH
1.0000 | ORAL_TABLET | Freq: Every day | ORAL | Status: DC
  Administered 2019-01-18 – 2019-01-21 (×4): 1
  Filled 2019-01-18 (×4): qty 1

## 2019-01-18 MED ORDER — ACETAMINOPHEN 325 MG PO TABS: 650.0000 mg | ORAL_TABLET | Freq: Four times a day (QID) | ORAL | Status: DC | PRN

## 2019-01-18 MED ORDER — PREDNISONE 10 MG PO TABS: 10.0000 mg | ORAL_TABLET | Freq: Every day | ORAL | Status: DC

## 2019-01-18 MED ORDER — ONDANSETRON HCL 4 MG PO TABS: 4.0000 mg | ORAL_TABLET | Freq: Four times a day (QID) | ORAL | Status: DC | PRN

## 2019-01-18 MED ORDER — MIDAZOLAM HCL 2 MG/2ML IJ SOLN
INTRAMUSCULAR | Status: AC
  Filled 2019-01-18: qty 2

## 2019-01-18 MED ORDER — SENNOSIDES-DOCUSATE SODIUM 8.6-50 MG PO TABS
2.0000 | ORAL_TABLET | Freq: Two times a day (BID) | ORAL | Status: DC
  Administered 2019-01-18 – 2019-01-19 (×4): 2
  Filled 2019-01-18 (×6): qty 2

## 2019-01-18 MED ORDER — ACETAMINOPHEN 650 MG RE SUPP: 650.0000 mg | Freq: Four times a day (QID) | RECTAL | Status: DC | PRN

## 2019-01-18 NOTE — Progress Notes (Signed)
Transported to CT and back to 2H15. No complications noted.

## 2019-01-18 NOTE — Progress Notes (Signed)
Heparin gtt turned off @1100  for IR later this afternoon

## 2019-01-18 NOTE — Progress Notes (Addendum)
Progress Note  Patient Name: Duane Burns Date of Encounter: 01/18/2019  Primary Cardiologist: Kate Sable, MD   Subjective   Pt intubated, awake.   Inpatient Medications    Scheduled Meds:  aspirin EC  81 mg Oral Daily   atorvastatin  80 mg Oral q1800   chlorhexidine gluconate (MEDLINE KIT)  15 mL Mouth Rinse BID   Chlorhexidine Gluconate Cloth  6 each Topical Daily   cholecalciferol  1,000 Units Oral Daily   clopidogrel  75 mg Oral Daily   fentaNYL (SUBLIMAZE) injection  25 mcg Intravenous Once   LORazepam  1 mg Intravenous Once   mouth rinse  15 mL Mouth Rinse 10 times per day   multivitamin with minerals  1 tablet Oral Daily   pyridostigmine  60 mg Oral TID   senna-docusate  2 tablet Oral BID   sodium chloride flush  3 mL Intravenous Q12H   Continuous Infusions:  sodium chloride 10 mL/hr at 01/17/19 0700   famotidine (PEPCID) IV Stopped (01/18/19 0012)   fentaNYL infusion INTRAVENOUS 75 mcg/hr (01/18/19 0700)   heparin 1,000 Units/hr (01/18/19 0700)   meropenem (MERREM) IV Stopped (01/18/19 0603)   norepinephrine (LEVOPHED) Adult infusion 21 mcg/min (01/18/19 0700)   vancomycin Stopped (01/17/19 1336)   PRN Meds: sodium chloride, acetaminophen **OR** acetaminophen, bisacodyl, fentaNYL, guaiFENesin, levalbuterol, midazolam, ondansetron **OR** ondansetron (ZOFRAN) IV, polyethylene glycol, sennosides, sodium chloride flush, traZODone   Vital Signs    Vitals:   01/18/19 0630 01/18/19 0645 01/18/19 0700 01/18/19 0715  BP: (!) 115/54 (!) 131/57 (!) 119/51   Pulse: 65 74 (!) 56 61  Resp: (!) 22 (!) 26 (!) 22 (!) 22  Temp:      TempSrc:      SpO2: 100% 100% 100% 100%  Weight:      Height:        Intake/Output Summary (Last 24 hours) at 01/18/2019 0755 Last data filed at 01/18/2019 0700 Gross per 24 hour  Intake 1746.48 ml  Output 1085 ml  Net 661.48 ml   Last 3 Weights 01/16/2019 12/30/2018 11/17/2018  Weight (lbs) 184 lb 4.9  oz 192 lb 192 lb  Weight (kg) 83.6 kg 87.091 kg 87.091 kg      Telemetry    Sinus with PVCs - Personally Reviewed  ECG    EKG from 01/17/19 with atrial fib.  - Personally Reviewed No EKG this am.   Physical Exam   General: Well developed, well nourished, intubated, awake HEENT: ET tube in place SKIN: warm, dry. No rashes. Neuro: Moves all ext Psychiatric: Unable to assess Neck: No JVD, no carotid bruits, no thyromegaly, no lymphadenopathy.  Lungs:Clear bilaterally, no wheezes, rhonci, crackles Cardiovascular: Regular rate and rhythm. Systolic murmur.  Abdomen:Soft. Bowel sounds present. Extremities: No lower extremity edema.   Labs    High Sensitivity Troponin:   Recent Labs  Lab 01/16/19 0230 01/16/19 0442 01/17/19 0229  TROPONINIHS >27,858.0* >26,548.0* >27,000*      Chemistry Recent Labs  Lab 01/16/19 0230 01/16/19 1024  01/17/19 0229  01/17/19 2148 01/18/19 0016 01/18/19 0400 01/18/19 0434  NA 138 142   < > 139   < > 140 140 140 139  K 4.2 4.8   < > 5.0   < > 4.3 4.3 4.2 4.2  CL 114* 110  --  110  --  111  --  110  --   CO2 18* 18*  --  18*  --  16*  --  18*  --  GLUCOSE 131* 136*  --  132*  --  161*  --  138*  --   BUN 27* 32*  --  44*  --  56*  --  59*  --   CREATININE 1.88* 2.44*  --  2.98*  --  3.29*  --  3.40*  --   CALCIUM 7.1* 7.6*  --  7.5*  --  7.1*  --  7.3*  --   PROT 5.3* 5.6*  --  5.5*  --   --   --   --   --   ALBUMIN 2.7* 2.9*  --  2.6*  --   --   --   --   --   AST 384* 544*  --  410*  --   --   --   --   --   ALT 73* 183*  --  159*  --   --   --   --   --   ALKPHOS 53 65  --  72  --   --   --   --   --   BILITOT 0.8 0.8  --  1.0  --   --   --   --   --   GFRNONAA 33* 24*  --  19*  --  17*  --  16*  --   GFRAA 38* 28*  --  22*  --  19*  --  19*  --   ANIONGAP 6 14  --  11  --  13  --  12  --    < > = values in this interval not displayed.     Hematology Recent Labs  Lab 01/16/19 1024  01/17/19 0229  01/18/19 0016  01/18/19 0400 01/18/19 0434  WBC 57.0*  --  52.8*  --   --  42.7*  --   RBC 4.24  --  4.12*  --   --  3.84*  --   HGB 12.7*   < > 12.4*   < > 11.6* 11.6* 10.9*  HCT 40.6   < > 38.4*   < > 34.0* 35.6* 32.0*  MCV 95.8  --  93.2  --   --  92.7  --   MCH 30.0  --  30.1  --   --  30.2  --   MCHC 31.3  --  32.3  --   --  32.6  --   RDW 14.1  --  14.3  --   --  14.4  --   PLT 253  --  205  --   --  185  --    < > = values in this interval not displayed.    BNP Recent Labs  Lab 01/16/19 1024  BNP 4,082.7*     DDimer No results for input(s): DDIMER in the last 168 hours.   Radiology    Dg Abd 1 View  Result Date: 01/17/2019 CLINICAL DATA:  OG tube placement EXAM: ABDOMEN - 1 VIEW COMPARISON:  January 17, 2019 FINDINGS: The OG tube now projects over the gastric body. The endotracheal tube terminates above the carina by approximately 3.2 cm. Multifocal airspace opacities are again noted in the lungs. There is a nonspecific bowel gas pattern. IMPRESSION: Improved OG tube positioning status post repositioning. Electronically Signed   By: Constance Holster M.D.   On: 01/17/2019 23:58   US Abdomen Complete  Result Date: 01/16/2019 CLINICAL DATA:  Abdominal pain for 1 day.  Elevated LFTs.  EXAM: ABDOMEN ULTRASOUND COMPLETE COMPARISON:  CT abdomen pelvis dated 01/02/2019 FINDINGS: Gallbladder: The gallbladder wall is thickened, measuring 9 mm. Nonshadowing gallstones/sludge are seen layering dependently within the gallbladder. Sonographic Percell Miller sign is negative. Common bile duct: Diameter: 2 mm Liver: No focal lesion identified. Within normal limits in parenchymal echogenicity. Portal vein is patent on color Doppler imaging with normal direction of blood flow towards the liver. IVC: No abnormality visualized. Pancreas: Obscured by overlying bowel gas. Spleen: Size and appearance within normal limits. Right Kidney: Length: 9.8. Echogenicity within normal limits. No mass or hydronephrosis  visualized. Left Kidney: Length: 10.0. Echogenicity within normal limits. A nonobstructive left renal calculus measures 1.2 cm. No mass or hydronephrosis visualized. Abdominal aorta: No aneurysm visualized. Other findings: None. IMPRESSION: 1. Thickened gallbladder wall with nonshadowing gallstones/sludge. Although the sonographic Percell Miller sign is reported as negative, these findings are suspicious for acute cholecystitis. Electronically Signed   By: Zerita Boers M.D.   On: 01/16/2019 13:44   Portable Chest Xray  Result Date: 01/18/2019 CLINICAL DATA:  ET tube present, respiratory failure. EXAM: PORTABLE CHEST 1 VIEW COMPARISON:  Chest radiograph 01/17/2011 FINDINGS: ET tube terminates 2.5 cm above the level of the carina. An enteric tube passes below the level of left hemidiaphragm with tip projecting in the expected location of the stomach. Side port not well-visualized, if present. Stable cardiomegaly. Aortic atherosclerosis. Patchy bilateral airspace opacities are similar to prior examination and may reflect edema or infection. Probable small pleural effusions and atelectasis again noted at the lung bases. No evidence of pneumothorax. IMPRESSION: Lines and tubes as detailed. Stable cardiomegaly. Patchy bilateral airspace opacities are similar to prior examination and may reflect edema or infection. Probable small pleural effusions on atelectasis again noted at the lung bases. Electronically Signed   By: Kellie Simmering DO   On: 01/18/2019 07:49   Dg Chest Port 1 View  Result Date: 01/17/2019 CLINICAL DATA:  Intubation. EXAM: PORTABLE CHEST 1 VIEW COMPARISON:  January 17, 2019 FINDINGS: The heart size remains enlarged. The patient has been intubated with the endotracheal tube terminating 4 cm above the carina. The enteric tube tip projects over the gastric body with the side hole projecting over the GE junction/distal esophagus. Aortic calcifications are noted. There appear to be small bilateral pleural  effusions. Again noted are perihilar airspace opacities with focal areas of consolidation or atelectasis at the lung bases. There is no pneumothorax. IMPRESSION: 1. Endotracheal tube as above. 2. The OG tube should be advanced further into the stomach. 3. Persistent multifocal airspace opacities which can be seen in patients with pulmonary edema or an atypical infectious process. 4. Probable small bilateral pleural effusions. Electronically Signed   By: Constance Holster M.D.   On: 01/17/2019 18:43   Dg Chest Port 1 View  Result Date: 01/17/2019 CLINICAL DATA:  Respiratory distress. Abnormal respiration. EXAM: PORTABLE CHEST 1 VIEW COMPARISON:  Radiograph yesterday. CT 01/24/2019 FINDINGS: Unchanged elevation of right hemidiaphragm. Progressive bilateral perihilar interstitial opacities and septal thickening. Findings are slightly asymmetric increased in the right suprahilar lung. Cardiomegaly slightly progressed. No visible pleural effusions. No pneumothorax. No acute osseous abnormalities. IMPRESSION: Worsening pulmonary edema. No element of superimposed pneumonia is also considered given the developing asymmetry. Slight increase in cardiomegaly. Electronically Signed   By: Keith Rake M.D.   On: 01/17/2019 02:06   Dg Abd Portable 1v  Result Date: 01/17/2019 CLINICAL DATA:  OG tube placement. EXAM: PORTABLE ABDOMEN - 1 VIEW COMPARISON:  January 14, 2001 FINDINGS: The  OG tube tip projects over the gastric body. The side hole projects over the expected region of the GE junction. The bowel gas pattern is nonspecific with gaseous distention of loops of small bowel and colon scattered throughout the abdomen. There is no evidence for pneumatosis or free air involving the visualized bowel loops. IMPRESSION: 1. Enteric tube as above. Consider advancing the OG tube further into the stomach. 2. Nonspecific bowel gas pattern with mild gaseous distention of loops of small bowel and colon. Electronically  Signed   By: Constance Holster M.D.   On: 01/17/2019 18:41    Cardiac Studies    Patient Profile     GREER KOEPPEN is a 83 year old male with history of severe aortic stenosis, myasthenia gravis and recent admission with E coli bacetermia who is admitted with a NSTEMI and sepsis with likely acute cholecystitis.    Assessment & Plan    1. NSTEMI: He is admitted with sepsis. New LV systolic dysfunction with new LBBB. He had moderately severe AS by echo in May 2020 and now with severe AS. Troponin over 27,000. Given acute cholecystitis, sepsis and acute renal failure, he was not felt to be a candidate for urgent cardiac cath over the weekend.  - Continue medical management of acute coronary syndrome for now with ASA, Plavix and statin. Continue IV heparin.  2. Severe aortic stenosis: Will need to have evaluation with right and left heart cath when sepsis resolved.   3. Shock: Likely combination of septic and cardiogenic shock. He is on broad spectrum antibiotics with acute cholecystitis. He is on Levophed. BP stable this am.   4.  Atrial fib/NSVT: He has had runs of atrial fib overnight and NSVT. Unable to add a beta blocker with hypotension.   5. Acute systolic CHF: LVEF 16-10% in setting of ACS, now severe AS and sepsis. Not a candidate for cardiac cath given renal failure and sepsis. Now intubated.  -I will ask the Advanced Heart Failure team to see him to assist in management given his acute shock with new LV dysfunction, severe AS and worsening renal failure with attempted diuresis.    For questions or updates, please contact [place] Please consult [email or web address] for contact info under        Signed, Lauree Chandler, MD  01/18/2019, 7:55 AM

## 2019-01-18 NOTE — Progress Notes (Signed)
RT note: attempted SBT on patient this AM on CPAP/PSV of 15/5 however patient's RR became high then patient went apneic and back-up ventilation alarmed.  Placed patient back on full support settings.  Tolerating well at this time.  Will continue to monitor.

## 2019-01-18 NOTE — Progress Notes (Addendum)
NAME:  Duane Burns, MRN:  [id number], DOB:  05/10/1935, LOS: 3 ADMISSION DATE:  01/23/2019,  CHIEF COMPLAINT:  Chest pain  Brief History   83 yo male who initally presented to [facility] with findings consistent with sepsis. Recent e. Coli bacteremia treated at OSH. While at [facility], patient developed chest pain with multiple PACs and PVCs and was subsequently transferred to [facility] for further evaluation and treatment. On arrival, trops>27k. Lactate 3.7. white count 37k. No ST elevation on initial EKG however did develop new LBBB on later EKG. Cardiology consulted and spoke with intervential cardiology. Given septic state, renal failure, was not candidate for cath lab.  Imaging suggestive of biliary source of sepsis with cholecytitis present. Not a candidate for lap chole at this time so IR consulted with plans for perc cholecystotomy tube place 11/23.  Past Medical History  Myasthenia Gravis HTN Prostate cancer HLD RAS   Significant Hospital Events   11/20 admitted to [facility] with sepsis 11/21 transferred to [facility] for chest pain and EKG changes  Consults:  Cardiology gen surg IR  Procedures:  11/20 CVC 11/22 Intubation 11/23 IR perc chole tube  Significant Diagnostic Tests:  11/20 echo: EF 60-65%. Severely decreased LV function. Mild LVH.  11/20 CT c/a/p:pericholecystic edema. Left renal stone  11/21 abd [place]: thickened gallbladder wall with gallstones/sludge. Suspicious for cholecystitis.  11/21 CXR: worsened CHF pattern 11/22 CXR: progressively worsened pulm edema. Slight increase in cardiomegaly  Micro Data:  11/20 BC>>NGTD  Antimicrobials:  merrem 11/21>> vanc 11/21>> Interim history/subjective:  Intubated last evening to allow for perc chole tube placement today as patient was unstable laying flat  Objective   Blood pressure (!) 113/49, pulse 64, temperature 97.9 F (36.6 C), temperature source Oral, resp. rate (!) 22, height 5\' 8"   (1.727 m), weight 83.6 kg, SpO2 100 %. CVP:  [0 mmHg-28 mmHg] 10 mmHg  Vent Mode: PRVC FiO2 (%):  [40 %-100 %] 60 % Set Rate:  [15 bmp-22 bmp] 22 bmp Vt Set:  [540 mL] 540 mL PEEP:  [5 cmH20-8 cmH20] 5 cmH20 Plateau Pressure:  [20 cmH20] 20 cmH20   Intake/Output Summary (Last 24 hours) at 01/18/2019 0609 Last data filed at 01/18/2019 0600 Gross per 24 hour  Intake 1737.92 ml  Output 965 ml  Net 772.92 ml   Filed Weights   01/07/2019 1235 01/16/19 0400  Weight: 87.1 kg 83.6 kg    Examination: General: sedated HEENT: ETT in place Lungs: diffuse wheezes throughout. Rhonchi present Cardiovascular: heart regular rate. Extremities warm Abdomen: diffusely tender to light palpation. Non-distended. bs active Extremities: warm Neuro: oriented to person only. No focal deficits appreciated GU: foley cath  Resolved Hospital Problem list   none  Assessment & Plan:  Acute respiratory failure. Likely multifactorial in setting of sepsis, pulm edema and possible pneumonia. Intubated 11/22 as patient became unstable lying flat and would not likely tolerate drain placement without more aggressive respiratory support. CXR this morning with small effusions and patchy interstitial opacities. pulm edema vs infection.  Plan:  Continue lasix. Continue to monitor lytes VAP protocol. Can attempt to wean vent following perc tube placement Continue vanc and merrem  Septic shock. Possibly superimposed on cardiogenic. Blood cultures from 11/20 NGTD. Recent e.coli bacteremia treated with abx at outside facility.  Continues to require pressor support Lactic acidosis 3.7. pH down to 7.28 last evening. Leukocytosis. Slowly coming down however likely has some chronic elevation at baseline in setting of chronic steroid  use. Cholecystitis with transaminitis as seen on [place]. Not a surgical candidate at this time Plan Follow blood cultures Titrate levo for MAP>65 Continue merrem and vanc Repeat labs in am IR  to place perc biliary drain today  Troponinemia. HS trops >27k New LBBB.  Non sustained Vtach Diastolic heart failure. BNP 4100 Cardiology consulted and discussed with interventional however not candidate at this time in setting of septic shock.  Plan Holding plavix and heparin for IR procedure. Will resume after  AKI. FeNa 1.23.History of RAS. Plan: avoid nephrotoxic agents. Renal US   Myasthenia Gravis. On chronic steroids, mestinon. Plan: continue mestinon. Home dose prednisone resumed. Best practice:  Diet: npo Pain/Anxiety/Delirium protocol (if indicated): fentanyl DVT prophylaxis: heparin infusion Mobility: BR Code Status: Full Family Communication: will try to update family later today Disposition: ICU pending medical stabilization   Mitzi Hansen, MD [place] PGY-1 Pager number [phone] 01/18/19  6:09 AM  Attending Note:  83 year old male with recent e-coli bacteremia treated in OSH likely from gallbladder disease presenting to [place] with NSTEMI.  Patient was transferred to [facility] for septic shock and was noted to have acute cholecystitis.  Troponin 27000.  Evaluated by cardiology and would like to cath but given renal function and acute infection with mixed circulatory shock decided on waiting till infection is addressed.  Overnight, was intubated late in the morning shift due to desaturation after laying flat and inability to properly diurese due to hypotension and renal failure.  Intermittent agitation overnight.  On exam, sedate with clear lungs this AM.  I reviewed CXR myself, ETT is in a good position.  Spoke with IR.  Plan on stopping heparin around 11 AM and taking patient down for a tube chole drain placement today in the afternoon.  In the meantime, continue NPO, continue full vent support.  PCCM will continue to manage.  The patient is critically ill with multiple organ systems failure and requires high complexity decision making for assessment  and support, frequent evaluation and titration of therapies, application of advanced monitoring technologies and extensive interpretation of multiple databases.   Critical Care Time devoted to patient care services described in this note is  35  Minutes. This time reflects time of care of this signee Dr Jennet Maduro. This critical care time does not reflect procedure time, or teaching time or supervisory time of PA/NP/Med student/Med Resident etc but could involve care discussion time.  Rush Farmer, M.D. [facility] Pulmonary/Critical Care Medicine.

## 2019-01-18 NOTE — Progress Notes (Signed)
Initial Nutrition Assessment  DOCUMENTATION CODES:   Not applicable  INTERVENTION:   If unable to extubate in 24-48 hrs recommend:   Vital AF 1.2 @ 45 ml/hr vi OGT  30 ml Prostat TID  Provides: 1596 kcals, 126 grams protein, 876 ml free water.   NUTRITION DIAGNOSIS:   Increased nutrient needs related to acute illness as evidenced by estimated needs.  GOAL:   Patient will meet greater than or equal to 90% of their needs  MONITOR:   Diet advancement, Vent status, Skin, TF tolerance, Weight trends, Labs, I & O's  REASON FOR ASSESSMENT:   Ventilator    ASSESSMENT:   Patient with PMH significant for myasthenia gravis, HTN, prostate cancer, and HLD. Presents this admission with sepsis in setting of recent e coli bacteremia and possible cholecystitis.   11/22- intubated   Plan perc cholecystotomy tube today in IR. Found to have NSTEMI- will eventually need R/L cath and possible PCI. Remains on levophed. Failed SBT this am. Will leave recommendations if needed s/p procedure.   Will utilize 83.6 kg as EDW.   Patient is currently intubated on ventilator support MV: 6.4 L/min Temp (24hrs), Avg:98.2 F (36.8 C), Min:97.8 F (36.6 C), Max:98.7 F (37.1 C)    I/O: +8,436 ml since admit UOP: 1,085 ml x 24 hrs    Drips: levophed Medications: Vitamin D, MVI with minerals, senokot, prednisone Labs: CBG 138-161 Cr 3.40-trending up   Diet Order:   Diet Order            Diet NPO time specified  Diet effective now              EDUCATION NEEDS:   Not appropriate for education at this time  Skin:  Skin Assessment: Reviewed RN Assessment  Last BM:  11/21  Height:   Ht Readings from Last 1 Encounters:  01/17/19 5\' 8"  (1.727 m)    Weight:   Wt Readings from Last 1 Encounters:  01/18/19 84.7 kg    Ideal Body Weight:  70 kg  BMI:  Body mass index is 28.39 kg/m.  Estimated Nutritional Needs:   Kcal:  1587 kcal  Protein:  125-145 grams  Fluid:   >/= 1.5 L/day   Mariana Single RD, LDN Clinical Nutrition Pager # - [phone]

## 2019-01-18 NOTE — Procedures (Signed)
Central Venous Catheter Insertion Procedure Note RAYMEN TONDRE [id number] 05-17-1935  Procedure: Insertion of Central Venous Catheter Indications: Assessment of intravascular volume, Drug and/or fluid administration and Frequent blood sampling  Procedure Details Consent: Risks of procedure as well as the alternatives and risks of each were explained to the (patient/caregiver).  Consent for procedure obtained. Time Out: Verified patient identification, verified procedure, site/side was marked, verified correct patient position, special equipment/implants available, medications/allergies/relevent history reviewed, required imaging and test results available.  Performed  Maximum sterile technique was used including antiseptics, cap, gloves, gown, hand hygiene, mask and sheet. Skin prep: Chlorhexidine; local anesthetic administered A antimicrobial bonded/coated triple lumen catheter was placed in the right internal jugular vein using the Seldinger technique.  Evaluation Blood flow good Complications: No apparent complications Patient did tolerate procedure well. Chest X-ray ordered to verify placement.  CXR: pending.  Johnsie Cancel, NP-C [place] [facility] & Critical Care After hours pager: [phone]. 01/18/2019, 3:39 PM

## 2019-01-18 NOTE — Progress Notes (Signed)
Subjective/Chief Complaint: Pt now intubated.    Objective: Vital signs in last 24 hours: Temp:  [96.6 F (35.9 C)-98.7 F (37.1 C)] 97.9 F (36.6 C) (11/23 0400) Pulse Rate:  [40-114] 61 (11/23 0715) Resp:  [16-45] 22 (11/23 0715) BP: (75-148)/(28-112) 119/51 (11/23 0700) SpO2:  [88 %-100 %] 100 % (11/23 0715) FiO2 (%):  [40 %-100 %] 60 % (11/23 0400) Last BM Date: 01/16/19  Intake/Output from previous day: 11/22 0701 - 11/23 0700 In: 1746.5 [P.O.:360; I.V.:936.5; IV Piggyback:450] Out: 1085 [Urine:1085] Intake/Output this shift: No intake/output data recorded.  Abdomen soft, not tender though pt is intubated and partially sedated  Lab Results:  Recent Labs    01/17/19 0229  01/18/19 0400 01/18/19 0434  WBC 52.8*  --  42.7*  --   HGB 12.4*   < > 11.6* 10.9*  HCT 38.4*   < > 35.6* 32.0*  PLT 205  --  185  --    < > = values in this interval not displayed.   BMET Recent Labs    01/17/19 2148  01/18/19 0400 01/18/19 0434  NA 140   < > 140 139  K 4.3   < > 4.2 4.2  CL 111  --  110  --   CO2 16*  --  18*  --   GLUCOSE 161*  --  138*  --   BUN 56*  --  59*  --   CREATININE 3.29*  --  3.40*  --   CALCIUM 7.1*  --  7.3*  --    < > = values in this interval not displayed.   PT/INR Recent Labs    01/16/19 0230 01/17/19 1442  LABPROT 18.4* 16.9*  INR 1.5* 1.4*   ABG Recent Labs    01/18/19 0016 01/18/19 0434  PHART 7.332* 7.364  HCO3 15.7* 17.2*    Studies/Results: Dg Abd 1 View  Result Date: 01/17/2019 CLINICAL DATA:  OG tube placement EXAM: ABDOMEN - 1 VIEW COMPARISON:  January 17, 2019 FINDINGS: The OG tube now projects over the gastric body. The endotracheal tube terminates above the carina by approximately 3.2 cm. Multifocal airspace opacities are again noted in the lungs. There is a nonspecific bowel gas pattern. IMPRESSION: Improved OG tube positioning status post repositioning. Electronically Signed   By: Constance Holster M.D.   On:  01/17/2019 23:58   US Abdomen Complete  Result Date: 01/16/2019 CLINICAL DATA:  Abdominal pain for 1 day.  Elevated LFTs. EXAM: ABDOMEN ULTRASOUND COMPLETE COMPARISON:  CT abdomen pelvis dated 01/13/2019 FINDINGS: Gallbladder: The gallbladder wall is thickened, measuring 9 mm. Nonshadowing gallstones/sludge are seen layering dependently within the gallbladder. Sonographic Percell Miller sign is negative. Common bile duct: Diameter: 2 mm Liver: No focal lesion identified. Within normal limits in parenchymal echogenicity. Portal vein is patent on color Doppler imaging with normal direction of blood flow towards the liver. IVC: No abnormality visualized. Pancreas: Obscured by overlying bowel gas. Spleen: Size and appearance within normal limits. Right Kidney: Length: 9.8. Echogenicity within normal limits. No mass or hydronephrosis visualized. Left Kidney: Length: 10.0. Echogenicity within normal limits. A nonobstructive left renal calculus measures 1.2 cm. No mass or hydronephrosis visualized. Abdominal aorta: No aneurysm visualized. Other findings: None. IMPRESSION: 1. Thickened gallbladder wall with nonshadowing gallstones/sludge. Although the sonographic Percell Miller sign is reported as negative, these findings are suspicious for acute cholecystitis. Electronically Signed   By: Zerita Boers M.D.   On: 01/16/2019 13:44   Dg Chest [facility]  Result Date: 01/17/2019 CLINICAL DATA:  Intubation. EXAM: PORTABLE CHEST 1 VIEW COMPARISON:  January 17, 2019 FINDINGS: The heart size remains enlarged. The patient has been intubated with the endotracheal tube terminating 4 cm above the carina. The enteric tube tip projects over the gastric body with the side hole projecting over the GE junction/distal esophagus. Aortic calcifications are noted. There appear to be small bilateral pleural effusions. Again noted are perihilar airspace opacities with focal areas of consolidation or atelectasis at the lung bases. There is no  pneumothorax. IMPRESSION: 1. Endotracheal tube as above. 2. The OG tube should be advanced further into the stomach. 3. Persistent multifocal airspace opacities which can be seen in patients with pulmonary edema or an atypical infectious process. 4. Probable small bilateral pleural effusions. Electronically Signed   By: Constance Holster M.D.   On: 01/17/2019 18:43   Dg Chest Port 1 View  Result Date: 01/17/2019 CLINICAL DATA:  Respiratory distress. Abnormal respiration. EXAM: PORTABLE CHEST 1 VIEW COMPARISON:  Radiograph yesterday. CT 12/31/2018 FINDINGS: Unchanged elevation of right hemidiaphragm. Progressive bilateral perihilar interstitial opacities and septal thickening. Findings are slightly asymmetric increased in the right suprahilar lung. Cardiomegaly slightly progressed. No visible pleural effusions. No pneumothorax. No acute osseous abnormalities. IMPRESSION: Worsening pulmonary edema. No element of superimposed pneumonia is also considered given the developing asymmetry. Slight increase in cardiomegaly. Electronically Signed   By: Keith Rake M.D.   On: 01/17/2019 02:06   Dg Abd Portable 1v  Result Date: 01/17/2019 CLINICAL DATA:  OG tube placement. EXAM: PORTABLE ABDOMEN - 1 VIEW COMPARISON:  January 14, 2001 FINDINGS: The OG tube tip projects over the gastric body. The side hole projects over the expected region of the GE junction. The bowel gas pattern is nonspecific with gaseous distention of loops of small bowel and colon scattered throughout the abdomen. There is no evidence for pneumatosis or free air involving the visualized bowel loops. IMPRESSION: 1. Enteric tube as above. Consider advancing the OG tube further into the stomach. 2. Nonspecific bowel gas pattern with mild gaseous distention of loops of small bowel and colon. Electronically Signed   By: Constance Holster M.D.   On: 01/17/2019 18:41    Anti-infectives: Anti-infectives (From admission, onward)   Start      Dose/Rate Route Frequency Ordered Stop   01/17/19 1200  vancomycin (VANCOCIN) IVPB 1000 mg/200 mL premix     1,000 mg 200 mL/hr over 60 Minutes Intravenous Every 24 hours 01/17/19 1018     01/17/19 0803  vancomycin variable dose per unstable renal function (pharmacist dosing)  Status:  Discontinued      Does not apply See admin instructions 01/17/19 0803 01/17/19 1018   01/16/19 1200  vancomycin (VANCOCIN) IVPB 1000 mg/200 mL premix  Status:  Discontinued     1,000 mg 200 mL/hr over 60 Minutes Intravenous Every 24 hours 01/08/2019 1753 01/17/19 0803   01/23/2019 1800  meropenem (MERREM) 1 g in sodium chloride 0.9 % 100 mL IVPB     1 g 200 mL/hr over 30 Minutes Intravenous Every 12 hours 12/27/2018 1751     01/18/2019 1300  vancomycin (VANCOCIN) IVPB 1000 mg/200 mL premix     1,000 mg 200 mL/hr over 60 Minutes Intravenous  Once 12/30/2018 1247 01/17/2019 1404      Assessment/Plan: 83 year old male with NSTEMI, likely acute cholecystitis- too sick for cath lab AKI History of myasthenia gravis on chronic steroids Hypertension Hyperlipidemia  Leukocytosis 42.7 Partially compensated metabolic acidosis  Not a candidate  for surgery agree with plans for perc chole tube, will continue to follow   LOS: 3 days    Clovis Riley 01/18/2019

## 2019-01-18 NOTE — Consult Note (Addendum)
CARDIOLOGY CONSULT NOTE   Referring Physician: Lauree Chandler Primary Physician: [name], DO Primary Cardiologist: Herminio Commons, MD Reason for Consultation: Acute decompensated heart failure    HPI:  Duane Burns is a 83yo male with PMH HTN, moderate to severe AS, myasthenia gravis, HLD, recent e. Coli bacteremia treated at [facility] (10/19-10/29) who presented to [facility] ED with sepsis secondary to acute cholecystitis requiring levo.  He was found to have elevated troponins up to 27,000 and new LBBB on EKG. Echo 5/20 showed EF 60-65% with mod-severe AS. ECHO this admission shows EF 20-25% and progression to severe AS. He also has been having intermittent runs of atrial fibrillation and NSVT but is unable to undergo cath at this time as he is still on pressors. He was intubated yesterday due to worsening respiratory failure and failed SBT this am.  HF consulted for shock, LV dysfunction with progression to severe AS and worsening renal function.   ECHO 5/20: EF 60-65% severely thickened aortic valve with calcifications and moderate to severe AS.   Echo 11/20: EF 20-25%, severe AS with calcifications and moderate regurge, mild LVH, moderate mitral calcification, degeneration with trace mitral regurge.   Review of Systems:   Unable to obtain ROS as patient is intubated and sedated.   Past Medical History:  Diagnosis Date  . Arthritis   . Cancer [facility])    History of skin cancer / hx Prostate cancer  . Dysphagia 11/10/2014  . Dysphonia 11/11/2014  . Foot drop, left    DUE TO NERVE INJURY IN BACK PER PT  . HNP (herniated nucleus pulposus with myelopathy), thoracic    HISTORY OF HNP  . Hyperlipidemia   . Hypertension   . Myasthenia gravis ([place]) 11/12/2014   versus Guillain-Barr syndrome.  . Pneumonia    JAN 2016  . Urgency of urination   . Urinary leakage     Medications Prior to Admission  Medication Sig Dispense Refill  . amoxicillin-clavulanate  (AUGMENTIN) 875-125 MG tablet Take 1 tablet by mouth 2 (two) times daily.    Marland Kitchen aspirin EC 81 MG tablet Take 81 mg by mouth daily.    . cholecalciferol (VITAMIN D) 1000 UNITS tablet Take 1,000 Units by mouth daily.    Marland Kitchen lisinopril (ZESTRIL) 5 MG tablet TAKE 1 TABLET BY MOUTH EVERY DAY 90 tablet 0  . Multiple Vitamin (MULTIVITAMIN WITH MINERALS) TABS tablet Take 1 tablet by mouth daily.    . Omega-3 Fatty Acids (FISH OIL) 1200 MG CAPS Take 1-2 capsules (1,200-2,400 mg total) by mouth 2 (two) times daily. RESTART IN 1 WEEK IF YOUR SWALLOWING CONTINUES TO IMPROVE. Take 2 capsules in the morning and 1 capsule in the evening    . pravastatin (PRAVACHOL) 80 MG tablet Take 1 tablet (80 mg total) by mouth every evening. 90 tablet 0  . predniSONE (DELTASONE) 10 MG tablet Take 10 mg by mouth daily.  5  . pyridostigmine (MESTINON) 60 MG tablet Take 60 mg by mouth 3 (three) times daily.        Marland Kitchen aspirin  81 mg Per Tube Daily  . atorvastatin  80 mg Per Tube q1800  . chlorhexidine gluconate (MEDLINE KIT)  15 mL Mouth Rinse BID  . Chlorhexidine Gluconate Cloth  6 each Topical Daily  . cholecalciferol  1,000 Units Oral Daily  . clopidogrel  75 mg Per Tube Daily  . fentaNYL (SUBLIMAZE) injection  25 mcg Intravenous Once  . furosemide  80 mg Intravenous Once  .  LORazepam  1 mg Intravenous Once  . mouth rinse  15 mL Mouth Rinse 10 times per day  . multivitamin with minerals  1 tablet Per Tube Daily  . predniSONE  10 mg Per Tube Daily  . pyridostigmine  60 mg Per Tube TID  . senna-docusate  2 tablet Per Tube BID  . sodium chloride flush  3 mL Intravenous Q12H    Infusions: . sodium chloride 10 mL/hr at 01/17/19 0700  . famotidine (PEPCID) IV Stopped (01/18/19 0012)  . fentaNYL infusion INTRAVENOUS 100 mcg/hr (01/18/19 1000)  . heparin 1,000 Units/hr (01/18/19 1000)  . meropenem (MERREM) IV Stopped (01/18/19 0603)  . norepinephrine (LEVOPHED) Adult infusion 21 mcg/min (01/18/19 1000)  . vancomycin  Stopped (01/17/19 1336)    No Known Allergies  Social History   Socioeconomic History  . Marital status: Married    Spouse name: Not on file  . Number of children: Not on file  . Years of education: Not on file  . Highest education level: Not on file  Occupational History  . Not on file  Social Needs  . Financial resource strain: Not hard at all  . Food insecurity    Worry: Never true    Inability: Never true  . Transportation needs    Medical: No    Non-medical: No  Tobacco Use  . Smoking status: Former Smoker    Quit date: 05/05/1994    Years since quitting: 24.7  . Smokeless tobacco: Never Used  Substance and Sexual Activity  . Alcohol use: No  . Drug use: No  . Sexual activity: Not Currently  Lifestyle  . Physical activity    Days per week: 0 days    Minutes per session: 0 min  . Stress: Not at all  Relationships  . Social connections    Talks on phone: More than three times a week    Gets together: More than three times a week    Attends religious service: More than 4 times per year    Active member of club or organization: Yes    Attends meetings of clubs or organizations: More than 4 times per year    Relationship status: Married  . Intimate partner violence    Fear of current or ex partner: No    Emotionally abused: No    Physically abused: No    Forced sexual activity: No  Other Topics Concern  . Not on file  Social History Narrative  . Not on file    Family History  Problem Relation Age of Onset  . Diabetes Other   . Hypertension Other     PHYSICAL EXAM: Vitals:   01/18/19 0900 01/18/19 1000  BP: (!) 121/54 (!) 145/49  Pulse: 61 70  Resp: (!) 22 (!) 21  Temp:    SpO2: 100% 100%     Intake/Output Summary (Last 24 hours) at 01/18/2019 1029 Last data filed at 01/18/2019 1000 Gross per 24 hour  Intake 1495.42 ml  Output 1440 ml  Net 55.42 ml    General:  acutely ill appearing, opens eyes to voice and physical stimuli  HEENT:  intubated Neck: supple. 8cm JVD. Carotids 2+ bilat; no bruits.  Cor: PMI nondisplaced. III/VI systolic murmur, +S1 and S2, irregular rate & rhythm.  Lungs: clear Abdomen: soft, TTP, nondistended. No hepatosplenomegaly. No bruits or masses. Good bowel sounds. Extremities: LE cool, +1 pedal pulse, trace edema Neuro: intubated and sedated, opens eyes to voice and physical stimuli, not following commands  ECG:  Results for orders placed or performed during the hospital encounter of 01/03/2019 (from the past 24 hour(s))  Type and screen Kell     Status: None   Collection Time: 01/17/19  2:42 PM  Result Value Ref Range   ABO/RH(D) B POS    Antibody Screen NEG    Sample Expiration      01/20/2019,2359 Performed at [facility], [place] [address], [place], [place] [address]   Protime-INR     Status: Abnormal   Collection Time: 01/17/19  2:42 PM  Result Value Ref Range   Prothrombin Time 16.9 (H) 11.4 - 15.2 seconds   INR 1.4 (H) 0.8 - 1.2  ABO/Rh     Status: None   Collection Time: 01/17/19  2:42 PM  Result Value Ref Range   ABO/RH(D)      B POS Performed at [place] [address] [place], [place] [address]   Sodium, urine, random     Status: None   Collection Time: 01/17/19  6:30 PM  Result Value Ref Range   Sodium, Ur 45 mmol/L  Creatinine, urine, random     Status: None   Collection Time: 01/17/19  6:30 PM  Result Value Ref Range   Creatinine, Urine 86.31 mg/dL  I-STAT 7, (LYTES, BLD GAS, ICA, H+H)     Status: Abnormal   Collection Time: 01/17/19  8:05 PM  Result Value Ref Range   pH, Arterial 7.286 (L) 7.350 - 7.450   pCO2 arterial 34.0 32.0 - 48.0 mmHg   pO2, Arterial 70.0 (L) 83.0 - 108.0 mmHg   Bicarbonate 16.3 (L) 20.0 - 28.0 mmol/L   TCO2 17 (L) 22 - 32 mmol/L   O2 Saturation 92.0 %   Acid-base deficit 10.0 (H) 0.0 - 2.0 mmol/L   Sodium 140 135 - 145 mmol/L   Potassium 4.4 3.5 - 5.1 mmol/L   Calcium, Ion 1.03 (L) 1.15 - 1.40  mmol/L   HCT 34.0 (L) 39.0 - 52.0 %   Hemoglobin 11.6 (L) 13.0 - 17.0 g/dL   Patient temperature 98.0 F    Sample type ARTERIAL   Basic metabolic panel     Status: Abnormal   Collection Time: 01/17/19  9:48 PM  Result Value Ref Range   Sodium 140 135 - 145 mmol/L   Potassium 4.3 3.5 - 5.1 mmol/L   Chloride 111 98 - 111 mmol/L   CO2 16 (L) 22 - 32 mmol/L   Glucose, Bld 161 (H) 70 - 99 mg/dL   BUN 56 (H) 8 - 23 mg/dL   Creatinine, Ser 3.29 (H) 0.61 - 1.24 mg/dL   Calcium 7.1 (L) 8.9 - 10.3 mg/dL   GFR calc non Af Amer 17 (L) >60 mL/min   GFR calc Af Amer 19 (L) >60 mL/min   Anion gap 13 5 - 15  Lactic acid, plasma     Status: Abnormal   Collection Time: 01/17/19  9:50 PM  Result Value Ref Range   Lactic Acid, Venous 2.3 (HH) 0.5 - 1.9 mmol/L  Lactic acid, plasma     Status: None   Collection Time: 01/18/19 12:13 AM  Result Value Ref Range   Lactic Acid, Venous 1.7 0.5 - 1.9 mmol/L  I-STAT 7, (LYTES, BLD GAS, ICA, H+H)     Status: Abnormal   Collection Time: 01/18/19 12:16 AM  Result Value Ref Range   pH, Arterial 7.332 (L) 7.350 - 7.450   pCO2 arterial 29.7 (  L) 32.0 - 48.0 mmHg   pO2, Arterial 119.0 (H) 83.0 - 108.0 mmHg   Bicarbonate 15.7 (L) 20.0 - 28.0 mmol/L   TCO2 17 (L) 22 - 32 mmol/L   O2 Saturation 98.0 %   Acid-base deficit 9.0 (H) 0.0 - 2.0 mmol/L   Sodium 140 135 - 145 mmol/L   Potassium 4.3 3.5 - 5.1 mmol/L   Calcium, Ion 1.04 (L) 1.15 - 1.40 mmol/L   HCT 34.0 (L) 39.0 - 52.0 %   Hemoglobin 11.6 (L) 13.0 - 17.0 g/dL   Patient temperature 98.7 F    Sample type ARTERIAL   Heparin level (unfractionated)     Status: Abnormal   Collection Time: 01/18/19  4:00 AM  Result Value Ref Range   Heparin Unfractionated <0.10 (L) 0.30 - 0.70 IU/mL  CBC     Status: Abnormal   Collection Time: 01/18/19  4:00 AM  Result Value Ref Range   WBC 42.7 (H) 4.0 - 10.5 K/uL   RBC 3.84 (L) 4.22 - 5.81 MIL/uL   Hemoglobin 11.6 (L) 13.0 - 17.0 g/dL   HCT 35.6 (L) 39.0 - 52.0 %    MCV 92.7 80.0 - 100.0 fL   MCH 30.2 26.0 - 34.0 pg   MCHC 32.6 30.0 - 36.0 g/dL   RDW 14.4 11.5 - 15.5 %   Platelets 185 150 - 400 K/uL   nRBC 0.0 0.0 - 0.2 %  Procalcitonin     Status: None   Collection Time: 01/18/19  4:00 AM  Result Value Ref Range   Procalcitonin 67.09 ng/mL  Basic metabolic panel     Status: Abnormal   Collection Time: 01/18/19  4:00 AM  Result Value Ref Range   Sodium 140 135 - 145 mmol/L   Potassium 4.2 3.5 - 5.1 mmol/L   Chloride 110 98 - 111 mmol/L   CO2 18 (L) 22 - 32 mmol/L   Glucose, Bld 138 (H) 70 - 99 mg/dL   BUN 59 (H) 8 - 23 mg/dL   Creatinine, Ser 3.40 (H) 0.61 - 1.24 mg/dL   Calcium 7.3 (L) 8.9 - 10.3 mg/dL   GFR calc non Af Amer 16 (L) >60 mL/min   GFR calc Af Amer 19 (L) >60 mL/min   Anion gap 12 5 - 15  I-STAT 7, (LYTES, BLD GAS, ICA, H+H)     Status: Abnormal   Collection Time: 01/18/19  4:34 AM  Result Value Ref Range   pH, Arterial 7.364 7.350 - 7.450   pCO2 arterial 30.1 (L) 32.0 - 48.0 mmHg   pO2, Arterial 159.0 (H) 83.0 - 108.0 mmHg   Bicarbonate 17.2 (L) 20.0 - 28.0 mmol/L   TCO2 18 (L) 22 - 32 mmol/L   O2 Saturation 99.0 %   Acid-base deficit 7.0 (H) 0.0 - 2.0 mmol/L   Sodium 139 135 - 145 mmol/L   Potassium 4.2 3.5 - 5.1 mmol/L   Calcium, Ion 1.05 (L) 1.15 - 1.40 mmol/L   HCT 32.0 (L) 39.0 - 52.0 %   Hemoglobin 10.9 (L) 13.0 - 17.0 g/dL   Patient temperature 98.6 F    Collection site RADIAL, ALLEN'S TEST ACCEPTABLE    Drawn by RT    Sample type ARTERIAL    Dg Abd 1 View  Result Date: 01/17/2019 CLINICAL DATA:  OG tube placement EXAM: ABDOMEN - 1 VIEW COMPARISON:  January 17, 2019 FINDINGS: The OG tube now projects over the gastric body. The endotracheal tube terminates above the carina by approximately  3.2 cm. Multifocal airspace opacities are again noted in the lungs. There is a nonspecific bowel gas pattern. IMPRESSION: Improved OG tube positioning status post repositioning. Electronically Signed   By: Constance Holster  M.D.   On: 01/17/2019 23:58   US Abdomen Complete  Result Date: 01/16/2019 CLINICAL DATA:  Abdominal pain for 1 day.  Elevated LFTs. EXAM: ABDOMEN ULTRASOUND COMPLETE COMPARISON:  CT abdomen pelvis dated 12/29/2018 FINDINGS: Gallbladder: The gallbladder wall is thickened, measuring 9 mm. Nonshadowing gallstones/sludge are seen layering dependently within the gallbladder. Sonographic Percell Miller sign is negative. Common bile duct: Diameter: 2 mm Liver: No focal lesion identified. Within normal limits in parenchymal echogenicity. Portal vein is patent on color Doppler imaging with normal direction of blood flow towards the liver. IVC: No abnormality visualized. Pancreas: Obscured by overlying bowel gas. Spleen: Size and appearance within normal limits. Right Kidney: Length: 9.8. Echogenicity within normal limits. No mass or hydronephrosis visualized. Left Kidney: Length: 10.0. Echogenicity within normal limits. A nonobstructive left renal calculus measures 1.2 cm. No mass or hydronephrosis visualized. Abdominal aorta: No aneurysm visualized. Other findings: None. IMPRESSION: 1. Thickened gallbladder wall with nonshadowing gallstones/sludge. Although the sonographic Percell Miller sign is reported as negative, these findings are suspicious for acute cholecystitis. Electronically Signed   By: Zerita Boers M.D.   On: 01/16/2019 13:44   Portable Chest Xray  Result Date: 01/18/2019 CLINICAL DATA:  ET tube present, respiratory failure. EXAM: PORTABLE CHEST 1 VIEW COMPARISON:  Chest radiograph 01/17/2011 FINDINGS: ET tube terminates 2.5 cm above the level of the carina. An enteric tube passes below the level of left hemidiaphragm with tip projecting in the expected location of the stomach. Side port not well-visualized, if present. Stable cardiomegaly. Aortic atherosclerosis. Patchy bilateral airspace opacities are similar to prior examination and may reflect edema or infection. Probable small pleural effusions and atelectasis  again noted at the lung bases. No evidence of pneumothorax. IMPRESSION: Lines and tubes as detailed. Stable cardiomegaly. Patchy bilateral airspace opacities are similar to prior examination and may reflect edema or infection. Probable small pleural effusions on atelectasis again noted at the lung bases. Electronically Signed   By: Kellie Simmering DO   On: 01/18/2019 07:49   Dg Chest Port 1 View  Result Date: 01/17/2019 CLINICAL DATA:  Intubation. EXAM: PORTABLE CHEST 1 VIEW COMPARISON:  January 17, 2019 FINDINGS: The heart size remains enlarged. The patient has been intubated with the endotracheal tube terminating 4 cm above the carina. The enteric tube tip projects over the gastric body with the side hole projecting over the GE junction/distal esophagus. Aortic calcifications are noted. There appear to be small bilateral pleural effusions. Again noted are perihilar airspace opacities with focal areas of consolidation or atelectasis at the lung bases. There is no pneumothorax. IMPRESSION: 1. Endotracheal tube as above. 2. The OG tube should be advanced further into the stomach. 3. Persistent multifocal airspace opacities which can be seen in patients with pulmonary edema or an atypical infectious process. 4. Probable small bilateral pleural effusions. Electronically Signed   By: Constance Holster M.D.   On: 01/17/2019 18:43   Dg Chest Port 1 View  Result Date: 01/17/2019 CLINICAL DATA:  Respiratory distress. Abnormal respiration. EXAM: PORTABLE CHEST 1 VIEW COMPARISON:  Radiograph yesterday. CT 01/12/2019 FINDINGS: Unchanged elevation of right hemidiaphragm. Progressive bilateral perihilar interstitial opacities and septal thickening. Findings are slightly asymmetric increased in the right suprahilar lung. Cardiomegaly slightly progressed. No visible pleural effusions. No pneumothorax. No acute osseous abnormalities. IMPRESSION: Worsening pulmonary  edema. No element of superimposed pneumonia is also  considered given the developing asymmetry. Slight increase in cardiomegaly. Electronically Signed   By: Keith Rake M.D.   On: 01/17/2019 02:06   Dg Abd Portable 1v  Result Date: 01/17/2019 CLINICAL DATA:  OG tube placement. EXAM: PORTABLE ABDOMEN - 1 VIEW COMPARISON:  January 14, 2001 FINDINGS: The OG tube tip projects over the gastric body. The side hole projects over the expected region of the GE junction. The bowel gas pattern is nonspecific with gaseous distention of loops of small bowel and colon scattered throughout the abdomen. There is no evidence for pneumatosis or free air involving the visualized bowel loops. IMPRESSION: 1. Enteric tube as above. Consider advancing the OG tube further into the stomach. 2. Nonspecific bowel gas pattern with mild gaseous distention of loops of small bowel and colon. Electronically Signed   By: Constance Holster M.D.   On: 01/17/2019 18:41   06/2018  +------------------+------------++ AORTIC VALVE                   +------------------+------------++ AV Area (Vmax):   0.88 cm     +------------------+------------++ AV Area (Vmean):  0.89 cm     +------------------+------------++ AV Area (VTI):    0.90 cm     +------------------+------------++ AV Vmax:          372.81 cm/s  +------------------+------------++ AV Vmean:         255.916 cm/s +------------------+------------++ AV VTI:           0.923 m      +------------------+------------++ AV Peak Grad:     55.6 mmHg    +------------------+------------++ AV Mean Grad:     30.9 mmHg    +------------------+------------++ LVOT Vmax:        86.15 cm/s   +------------------+------------++ LVOT Vmean:       60.100 cm/s  +------------------+------------++ LVOT VTI:         0.220 m      +------------------+------------++ LVOT/AV VTI ratio:0.24         +------------------+------------++ AR PHT:           779 msec      +------------------+------------++  12/2018 LEFT VENTRICLE PLAX 2D LVIDd:         4.17 cm LVIDs:         2.50 cm LV PW:         1.26 cm LV IVS:        1.30 cm LVOT diam:     1.80 cm LV SV:         55 ml LV SV Index:   26.59 LVOT Area:     2.54 cm  ASSESSMENT:  Duane Burns is a 83yo male with PMH HTN, moderate to severe AS, myasthenia gravis, HLD, recent e. Coli bacteremia treated at [facility] (10/19-10/29) presenting with sepsis secondary to acute cholecystitis with acute HF and severe aortic stenosis.   PLAN/DISCUSSION:   Acute decompensated Systolic HF with cardiogenic shock  Acute Respiratory Failure  BNP 4100 11/20. Acute HF & cardiogenic shock 2/2 worsening aortic stenosis in the setting of septic, lower extremities cool. EF 20-25% with severe AS from 60-65% with mod-severe AS on 06/2018. Received 40 IV lasix yesterday with worsening pulmonary edema on cxr today, creatinine trending up as well. Uop only net positive .6L overnight. Intubated yesterday for worsening respiratory failure, failed SBT today.  - continuing levo with map goal >65 - 80 lasix IV once today - cath once HD stable  -  intubated and sedated with vent management per PCCM - Central line in right femoral - will need to switch to IJ  - cont. vanc/merrem   New Left Bundle Branch Block NSTEMI Trops >27K, new LBBB on EKG, not a candidate for urgent cath due to sepsis requiring pressors. Will need cath once stable and there is improvement in renal function. Heparin, asa, plavix with loading dose started. Having episodes of Afib/NSVT overnight.  - cont. Diurese, monitor renal function  - cath once improved renal function and HDS  - no BB or other BP medications due to hypotension requiring levophed, no ACE/ARB with AKI.  - did not receive asa or plavix yesterday due to spitting out meds, held this morning for IR procedure, will resume afterward.   - resume IV heparin after IR drain placed.  - will need to switch  femoral line to IJ    Aortic Stenosis Worsened AS since May of this year. Will need heart cath after he is stable and has improvement in renal function and evaluation for aortic valve replacement.   AKI Baseline Cr 1. 1.65 on admission 11/20, now 3.4. 2/2 septic shock. Additionally has history of RAS. Recent hx of FeNa 1.23, possible component of ATN as well.  - daily cmp  - Ur pr/cr   Septic Shock  Acute Cholecystitis  Secondary to acute cholecystitis. Very elevated white count likely secondary to chronic prednisone for MG. Cholecystostomy tube to be placed per IR today. - cont. Vanc/merrem - heparin, asa, plavix held for drain placement today - restarting after procedure  - surgery consulted, currently not a surgical candidate  MG - on Prednisone, pyridostigmine.   Seawell, Jaimie A, DO 01/18/2019, 11:51 AM Pager: [phone]  Agree with above.   83 y/o male with MG, aortic stenosis, recent e.coli bacteremia now admitted with septic shock and MSOF in setting of acute cholecystitis.  Cardiac w/u significant NSTEMI with hsTrop >27K with new LBBB. Echo with EF 20-25% (RV ok) and severe low-gradient AS.   Currently intubated on 40% FiO2. CXR with worsening airspace disease CVP 12. Failed SBT. PCk 67  Plan for perc chole tube today.   Creatinine 1.6 ->-> 3.3 -> 3.4. Urine output picking back up.   On exam General:  Sedated on vent but will arouse HEENT: normal Neck: supple. JVP to ear. Carotids 2+ bilat; no bruits. No lymphadenopathy or thryomegaly appreciated. Cor: PMI nondisplaced. Irregular 2/6 AS with severely diminished s2 Lungs: coarse Abdomen: soft, +tender RUQ , nondistended. No hepatosplenomegaly. No bruits or masses. Good bowel sounds. Extremities: no cyanosis, clubbing, rash, edema Neuro: sedated on vent  He is critically ill with septic shock with MSOF in setting of gallbladder sepsis. Plan for PERC tube today. Continue NE for hemodynamic support. Will attempt  diuresis to help with worsening aeration and support extubation. Will need eventual R/L cath with eye toward TAVR and possible PCI if overall condition and renal function permit.   CRITICAL CARE Performed by: Glori Bickers  Total critical care time: 35 minutes  Critical care time was exclusive of separately billable procedures and treating other patients.  Critical care was necessary to treat or prevent imminent or life-threatening deterioration.  Critical care was time spent personally by me (independent of midlevel providers or residents) on the following activities: development of treatment plan with patient and/or surrogate as well as nursing, discussions with consultants, evaluation of patient's response to treatment, examination of patient, obtaining history from patient or surrogate, ordering and performing treatments and  interventions, ordering and review of laboratory studies, ordering and review of radiographic studies, pulse oximetry and re-evaluation of patient's condition.  Glori Bickers, MD  12:28 PM

## 2019-01-18 NOTE — Progress Notes (Signed)
Chaplain engaged in initial visit with Duane Burns and his wife Inez Catalina.  Chaplain explained to Mrs. Logeman the spiritual care support services offered.  Mrs. Manaois shared with the chaplain her life with Duane Burns from their kids to great grandchildren, and also expressed concerns about Mr. Hardin emotional reaction to the medicines he has been on, stating that Mr. Sherfey has seemed "out of his head."  Chaplain will continue to follow up.

## 2019-01-18 NOTE — Progress Notes (Signed)
ANTICOAGULATION CONSULT NOTE Pharmacy Consult for:  Heparin Indication: ACS/STEMI  No Known Allergies  Patient Measurements: Height: 5\' 8"  (172.7 cm) Weight: 184 lb 4.9 oz (83.6 kg) IBW/kg (Calculated) : 68.4 HEPARIN DW (KG): 86 kg  Vital Signs: Temp: 97.9 F (36.6 C) (11/23 0400) Temp Source: Oral (11/23 0400) BP: 130/49 (11/23 0415) Pulse Rate: 65 (11/23 0415)  Labs: Recent Labs    01/14/2019 1207 01/16/19 0230  01/16/19 0442 01/16/19 1024  01/16/19 2145  01/17/19 0229 01/17/19 1442  01/17/19 2148 01/18/19 0016 01/18/19 0400 01/18/19 0434  HGB 12.7* 10.6*   < >  --  12.7*  --   --    < > 12.4*  --    < >  --  11.6* 11.6* 10.9*  HCT 39.8 33.9*   < >  --  40.6  --   --    < > 38.4*  --    < >  --  34.0* 35.6* 32.0*  PLT 210 214   < >  --  253  --   --   --  205  --   --   --   --  185  --   APTT 29  --   --   --   --   --   --   --   --   --   --   --   --   --   --   LABPROT 14.8 18.4*  --   --   --   --   --   --   --  16.9*  --   --   --   --   --   INR 1.2 1.5*  --   --   --   --   --   --   --  1.4*  --   --   --   --   --   HEPARINUNFRC  --   --    < >  --  1.09*   < > 0.58  --  0.54  --   --   --   --  <0.10*  --   CREATININE 1.65* 1.88*  --   --  2.44*  --   --   --  2.98*  --   --  3.29*  --  3.40*  --   TROPONINIHS  --  >27,858.0*  --  >26,548.0*  --   --   --   --  >27,000*  --   --   --   --   --   --    < > = values in this interval not displayed.    Estimated Creatinine Clearance: 18 mL/min (A) (by C-G formula based on SCr of 3.4 mg/dL (H)).   Assessment: 83 yo male with NSTEMI for heparin.   Goal of Therapy:  Heparin level 0.3-0.7 units/ml Monitor platelets by anticoagulation protocol: Yes   Plan:  Increase Heparin 1000 units/hr Check heparin level in 8 hours.  Phillis Knack, PharmD, BCPS  01/18/2019 4:57 AM

## 2019-01-19 ENCOUNTER — Inpatient Hospital Stay (HOSPITAL_COMMUNITY): Payer: Medicare HMO

## 2019-01-19 DIAGNOSIS — R579 Shock, unspecified: Secondary | ICD-10-CM

## 2019-01-19 LAB — COMPREHENSIVE METABOLIC PANEL
ALT: 88 U/L — ABNORMAL HIGH (ref 0–44)
AST: 78 U/L — ABNORMAL HIGH (ref 15–41)
Albumin: 2.4 g/dL — ABNORMAL LOW (ref 3.5–5.0)
Alkaline Phosphatase: 82 U/L (ref 38–126)
Anion gap: 13 (ref 5–15)
BUN: 57 mg/dL — ABNORMAL HIGH (ref 8–23)
CO2: 22 mmol/L (ref 22–32)
Calcium: 7.9 mg/dL — ABNORMAL LOW (ref 8.9–10.3)
Chloride: 109 mmol/L (ref 98–111)
Creatinine, Ser: 2.87 mg/dL — ABNORMAL HIGH (ref 0.61–1.24)
GFR calc Af Amer: 23 mL/min — ABNORMAL LOW (ref >60)
GFR calc non Af Amer: 20 mL/min — ABNORMAL LOW (ref >60)
Glucose, Bld: 84 mg/dL (ref 70–99)
Potassium: 3.4 mmol/L — ABNORMAL LOW (ref 3.5–5.1)
Sodium: 144 mmol/L (ref 135–145)
Total Bilirubin: 2.5 mg/dL — ABNORMAL HIGH (ref 0.3–1.2)
Total Protein: 5.1 g/dL — ABNORMAL LOW (ref 6.5–8.1)

## 2019-01-19 LAB — CBC
HCT: 33.5 % — ABNORMAL LOW (ref 39.0–52.0)
Hemoglobin: 11.1 g/dL — ABNORMAL LOW (ref 13.0–17.0)
MCH: 29.6 pg (ref 26.0–34.0)
MCHC: 33.1 g/dL (ref 30.0–36.0)
MCV: 89.3 fL (ref 80.0–100.0)
Platelets: 121 10*3/uL — ABNORMAL LOW (ref 150–400)
RBC: 3.75 MIL/uL — ABNORMAL LOW (ref 4.22–5.81)
RDW: 14.1 % (ref 11.5–15.5)
WBC: 24.4 10*3/uL — ABNORMAL HIGH (ref 4.0–10.5)
nRBC: 0 % (ref 0.0–0.2)

## 2019-01-19 LAB — COOXEMETRY PANEL
Carboxyhemoglobin: 0.9 % (ref 0.5–1.5)
Methemoglobin: 1.1 % (ref 0.0–1.5)
O2 Saturation: 62.7 %
Total hemoglobin: 14.7 g/dL (ref 12.0–16.0)

## 2019-01-19 LAB — PHOSPHORUS
Phosphorus: 3.3 mg/dL (ref 2.5–4.6)
Phosphorus: 3.4 mg/dL (ref 2.5–4.6)

## 2019-01-19 LAB — POCT I-STAT 7, (LYTES, BLD GAS, ICA,H+H)
Acid-base deficit: 3 mmol/L — ABNORMAL HIGH (ref 0.0–2.0)
Bicarbonate: 19.4 mmol/L — ABNORMAL LOW (ref 20.0–28.0)
Calcium, Ion: 1.08 mmol/L — ABNORMAL LOW (ref 1.15–1.40)
HCT: 33 % — ABNORMAL LOW (ref 39.0–52.0)
Hemoglobin: 11.2 g/dL — ABNORMAL LOW (ref 13.0–17.0)
O2 Saturation: 99 %
Patient temperature: 98
Potassium: 3.6 mmol/L (ref 3.5–5.1)
Sodium: 143 mmol/L (ref 135–145)
TCO2: 20 mmol/L — ABNORMAL LOW (ref 22–32)
pCO2 arterial: 26.2 mmHg — ABNORMAL LOW (ref 32.0–48.0)
pH, Arterial: 7.476 — ABNORMAL HIGH (ref 7.350–7.450)
pO2, Arterial: 118 mmHg — ABNORMAL HIGH (ref 83.0–108.0)

## 2019-01-19 LAB — HEPARIN LEVEL (UNFRACTIONATED)
Heparin Unfractionated: <0.1 IU/mL — ABNORMAL LOW (ref 0.30–0.70)
Heparin Unfractionated: <0.1 IU/mL — ABNORMAL LOW (ref 0.30–0.70)
Heparin Unfractionated: 0.82 IU/mL — ABNORMAL HIGH (ref 0.30–0.70)

## 2019-01-19 LAB — GLUCOSE, CAPILLARY
Glucose-Capillary: 118 mg/dL — ABNORMAL HIGH (ref 70–99)
Glucose-Capillary: 124 mg/dL — ABNORMAL HIGH (ref 70–99)
Glucose-Capillary: 80 mg/dL (ref 70–99)

## 2019-01-19 LAB — MAGNESIUM: Magnesium: 2 mg/dL (ref 1.7–2.4)

## 2019-01-19 MED ORDER — DEXMEDETOMIDINE HCL IN NACL 400 MCG/100ML IV SOLN
0.4000 ug/kg/h | INTRAVENOUS | Status: DC
  Administered 2019-01-19: 0.4 ug/kg/h via INTRAVENOUS
  Filled 2019-01-19: qty 100

## 2019-01-19 MED ORDER — FUROSEMIDE 10 MG/ML IJ SOLN: 80.0000 mg | Freq: Once | INTRAMUSCULAR | Status: DC

## 2019-01-19 MED ORDER — HYDROCORTISONE NA SUCCINATE PF 100 MG IJ SOLR
50.0000 mg | Freq: Four times a day (QID) | INTRAMUSCULAR | Status: DC
  Administered 2019-01-19 – 2019-01-21 (×9): 50 mg via INTRAVENOUS
  Filled 2019-01-19 (×9): qty 2

## 2019-01-19 MED ORDER — POTASSIUM CHLORIDE 20 MEQ PO PACK: 40.0000 meq | PACK | ORAL | Status: DC

## 2019-01-19 MED ORDER — HEPARIN BOLUS VIA INFUSION
2000.0000 [IU] | Freq: Once | INTRAVENOUS | Status: AC
  Administered 2019-01-19: 2000 [IU] via INTRAVENOUS
  Filled 2019-01-19: qty 2000

## 2019-01-19 MED ORDER — FENTANYL CITRATE (PF) 100 MCG/2ML IJ SOLN
25.0000 ug | INTRAMUSCULAR | Status: DC | PRN
  Administered 2019-01-19 – 2019-01-28 (×9): 25 ug via INTRAVENOUS
  Filled 2019-01-19 (×3): qty 2

## 2019-01-19 MED ORDER — PRO-STAT SUGAR FREE PO LIQD
30.0000 mL | Freq: Two times a day (BID) | ORAL | Status: DC
  Administered 2019-01-19 – 2019-01-21 (×5): 30 mL
  Filled 2019-01-19 (×5): qty 30

## 2019-01-19 MED ORDER — POTASSIUM CHLORIDE 20 MEQ PO PACK
40.0000 meq | PACK | ORAL | Status: AC
  Administered 2019-01-19 (×2): 40 meq
  Filled 2019-01-19 (×2): qty 2

## 2019-01-19 MED ORDER — MIDAZOLAM HCL 2 MG/2ML IJ SOLN
2.0000 mg | INTRAMUSCULAR | Status: DC | PRN
  Administered 2019-01-19 – 2019-01-30 (×20): 2 mg via INTRAVENOUS
  Filled 2019-01-19 (×24): qty 2

## 2019-01-19 MED ORDER — FENTANYL 2500MCG IN NS 250ML (10MCG/ML) PREMIX INFUSION
0.0000 ug/h | INTRAVENOUS | Status: DC
  Administered 2019-01-19: 25 ug/h via INTRAVENOUS
  Administered 2019-01-20: 100 ug/h via INTRAVENOUS
  Filled 2019-01-19 (×2): qty 250

## 2019-01-19 MED ORDER — VITAL AF 1.2 CAL PO LIQD
1000.0000 mL | ORAL | Status: DC
  Administered 2019-01-19 – 2019-01-20 (×2): 1000 mL

## 2019-01-19 NOTE — Progress Notes (Signed)
VAST consulted to place PIV for heparin infusion so femoral CL can be dc'd. Assessed pt's left arm and attempted PIV placement in AF without success. Left upper arm assessed; cephalic vein not large enough to access with USGPIV.

## 2019-01-19 NOTE — Progress Notes (Signed)
ANTICOAGULATION CONSULT NOTE Pharmacy Consult for:  Heparin Indication: ACS/STEMI  No Known Allergies  Patient Measurements: Height: 5\' 8"  (172.7 cm) Weight: 175 lb 7.8 oz (79.6 kg) IBW/kg (Calculated) : 68.4 HEPARIN DW (KG): 79.6 kg  Vital Signs: Temp: 98.8 F (37.1 C) (11/24 1139) Temp Source: Axillary (11/24 1139) BP: 91/63 (11/24 1200) Pulse Rate: 70 (11/24 1200)  Labs: Recent Labs    01/17/19 0229 01/17/19 1442  01/17/19 2148  01/18/19 0400 01/18/19 0434 01/19/19 0132 01/19/19 0339 01/19/19 0345 01/19/19 1134  HGB 12.4*  --    < >  --    < > 11.6* 10.9*  --  11.2* 11.1*  --   HCT 38.4*  --    < >  --    < > 35.6* 32.0*  --  33.0* 33.5*  --   PLT 205  --   --   --   --  185  --   --   --  121*  --   LABPROT  --  16.9*  --   --   --   --   --   --   --   --   --   INR  --  1.4*  --   --   --   --   --   --   --   --   --   HEPARINUNFRC 0.54  --   --   --   --  <0.10*  --  <0.10*  --   --  <0.10*  CREATININE 2.98*  --   --  3.29*  --  3.40*  --   --   --  2.87*  --   TROPONINIHS >27,000*  --   --   --   --   --   --   --   --   --   --    < > = values in this interval not displayed.    Estimated Creatinine Clearance: 19.5 mL/min (A) (by C-G formula based on SCr of 2.87 mg/dL (H)).   Assessment: 83 yo male with NSTEMI and elevated troponin. Pharmacy asked to dose heparin. No anticoagulation prior to admission.  Heparin level remains undetectable after increasing drip rate to 1200 units/hr. CBC slowly trending down with Hgb 11.1 and plts 121 today. No overt bleeding or infusion issues per RN.   Confirmed that heparin has not been stopped and level was drawn from central line with heparin running through peripheral. Will re-bolus and increase drip rate given that this is the second undetectable level.  Goal of Therapy:  Heparin level 0.3-0.7 units/ml Monitor platelets by anticoagulation protocol: Yes   Plan:  Re-bolus heparin 2000 units IV Increase Heparin to  1400 units/hr IV Check heparin level in 8 hours. Daily HL and CBC Monitor for bleeding  Richardine Service, PharmD PGY1 Pharmacy Resident Phone: [phone] 01/19/2019  12:31 PM  Please check [email or web address] for unit-specific pharmacy phone numbers.

## 2019-01-19 NOTE — Progress Notes (Signed)
Nutrition Follow up  DOCUMENTATION CODES:   Not applicable  INTERVENTION:   Tube feeding:  -Vital AF 1.2 @ 55 ml/hr vi OGT  -30 ml Prostat BID  Provides: 1784 kcals, 129 grams protein, 1071 ml free water.   NUTRITION DIAGNOSIS:   Increased nutrient needs related to acute illness as evidenced by estimated needs.  Ongoing  GOAL:   Patient will meet greater than or equal to 90% of their needs   Addressed via TF  MONITOR:   Diet advancement, Vent status, Skin, TF tolerance, Weight trends, Labs, I & O's  REASON FOR ASSESSMENT:   Ventilator    ASSESSMENT:   Patient with PMH significant for myasthenia gravis, HTN, prostate cancer, and HLD. Presents this admission with sepsis in setting of recent e coli bacteremia and possible cholecystitis.   11/22- intubated   Pt discussed during ICU rounds and with RN.   Perc cholecystomy tube deferred as gallbladder distention resolved. Failed SBT this am. Okay to start feeding per CCM.   NFPE performed. Mild to moderate depletions noted. Suspect malnutrition but unable to diagnosis without history.   Admission weight: 87.1 kg  Current weight: 79.6 kg   Patient remains intubated on ventilator support MV: 11.8 L/min Temp (24hrs), Avg:98.4 F (36.9 C), Min:97.4 F (36.3 C), Max:99.1 F (37.3 C)    I/O: +5, 090 ml since admit UOP: 4,705 ml x 24 hrs  OGT: 225 ml x 24 hrs   Drips: levophed Medications: Vitamin D, solucortef, MVI with minerals, senkot Labs: K 3.4 Cr 2.87- trending down    Diet Order:   Diet Order            Diet NPO time specified  Diet effective now              EDUCATION NEEDS:   Not appropriate for education at this time  Skin:  Skin Assessment: Reviewed RN Assessment  Last BM:  11/21  Height:   Ht Readings from Last 1 Encounters:  01/17/19 5\' 8"  (1.727 m)    Weight:   Wt Readings from Last 1 Encounters:  01/19/19 79.6 kg    Ideal Body Weight:  70 kg  BMI:  Body mass index  is 26.68 kg/m.  Estimated Nutritional Needs:   Kcal:  1732 kcal  Protein:  125-145 grams  Fluid:  >/= 1.5 L/day   Mariana Single RD, LDN Clinical Nutrition Pager # - [phone]

## 2019-01-19 NOTE — Progress Notes (Addendum)
NAME:  Duane Burns, MRN:  [id number], DOB:  12-Dec-1935, LOS: 4 ADMISSION DATE:  01/10/2019,  CHIEF COMPLAINT:  Chest pain  Brief History   83 yo male who initally presented to [facility] with findings consistent with sepsis. Recent e. Coli bacteremia treated at OSH. While at [facility], patient developed chest pain with multiple PACs and PVCs and was subsequently transferred to [facility] for further evaluation and treatment. On arrival, trops>27k. Lactate 3.7. white count 37k. No ST elevation on initial EKG however did develop new LBBB on later EKG. Cardiology consulted and spoke with intervential cardiology. Given septic state, renal failure, was not candidate for cath lab.  Imaging suggestive of biliary source of sepsis with cholecytitis present. Not a candidate for lap chole at that time so IR consulted with plans for perc cholecystotomy tube place 11/23 however on repeat imaging, gallbladder distension was resolved so tube placement was deferred.  Past Medical History  Myasthenia Gravis HTN Prostate cancer HLD RAS   Significant Hospital Events   11/20 admitted to [facility] with sepsis 11/21 transferred to [facility] for chest pain and EKG changes  Consults:  Cardiology gen surg IR  Procedures:  11/20 CVC 11/22 Intubation 11/23 central IJ line placement  Significant Diagnostic Tests:  11/20 echo: EF 30-35%. Severely decreased LV function. Mild LVH. 11/21 echo: EF 20-25% with severely decreased LV function. No LVH. Diffuse hypokinesis most prominently of the anteroseptal wall and entire apex  11/20 CT c/a/p:pericholecystic edema. Left renal stone  11/21 abd [place]: thickened gallbladder wall with gallstones/sludge. Suspicious for cholecystitis. 11/23 CT a/p: interval resolution of gallbladder distension. New bilateral pleural effusions and bibasilar consolidation  11/21 CXR: worsened CHF pattern 11/22 CXR: progressively worsened pulm edema. Slight increase in  cardiomegaly 11/23 CXR s/p central line placement: improving bilateral pulm infiltrates.   Micro Data:  11/20 BC>>NGTD  Antimicrobials:  merrem 11/21>> vanc 11/21>> Interim history/subjective:  IR perc chole tube placement deferred as CT imaging showed resolution of gallbladder distension White count down to 24k Platelet drop 185-->121  Objective   Blood pressure 109/73, pulse (!) 59, temperature 98.1 F (36.7 C), temperature source Oral, resp. rate (!) 22, height 5\' 8"  (1.727 m), weight 84.7 kg, SpO2 100 %.    Vent Mode: PRVC FiO2 (%):  [40 %] 40 % Set Rate:  [22 bmp] 22 bmp Vt Set:  [540 mL] 540 mL PEEP:  [5 cmH20] 5 cmH20 Pressure Support:  [5 cmH20-15 cmH20] 5 cmH20 Plateau Pressure:  [16 cmH20-22 cmH20] 22 cmH20   Intake/Output Summary (Last 24 hours) at 01/19/2019 0554 Last data filed at 01/19/2019 0300 Gross per 24 hour  Intake 1655.43 ml  Output 4595 ml  Net -2939.57 ml   Filed Weights   12/27/2018 1235 01/16/19 0400 01/18/19 1000  Weight: 87.1 kg 83.6 kg 84.7 kg    Examination: General: sedated HEENT: ETT in place Lungs: diffuse wheezes throughout. Rhonchi present Cardiovascular: heart regular rate. Extremities warm Abdomen: diffusely tender to light palpation. Non-distended. bs active Extremities: warm Neuro: oriented to person only. No focal deficits appreciated GU: foley cath  Resolved Hospital Problem list   none  Assessment & Plan:  Acute respiratory failure. Likely multifactorial in setting of sepsis, pulm edema and possible pneumonia.  Plan:  VAP protocol. Continue SBTs with goal to wean  Septic shock.  Blood cultures from 11/20 NGTD. White count trending down.  Cardiogenic shock Trops >27k. New LBBB on EKG. BNP 4100 on admission. 11/21 echo with  EF of 20-25% with severe hypokinesis of anteroseptum and apex. Aortic Stenosis.  Will need cath once stable and renal function improves.  Continues to require pressor support Plan Follow blood  cultures Titrate levo for MAP>65 Continue merrem and vanc Asprin, plavix, heparin infusion  AKI. Improving. FeNa 1.23.History of RAS. Plan: avoid nephrotoxic agents  Myasthenia Gravis.   Plan: continue mestinon, prednisone  Best practice:  Diet: npo Pain/Anxiety/Delirium protocol (if indicated): fentanyl DVT prophylaxis: heparin infusion Mobility: BR Code Status: Full Family Communication: wife updated at bedside Disposition: ICU pending medical stabilization  Mitzi Hansen, MD [place] PGY-1 Pager number [phone] 01/19/19  5:54 AM  Attending Note:  83 year old male with CAD disease history who presents to PCCM with an acute MI and circulatory shock with concern if this is cardiogenic vs septic vs steroid withdrawal related.  Patient went down to IR for gallbladder drainage placement and on CT the gallbladder was decompressed and no need for drains was noted.  Surgery saw patient this AM and does not believe there are surgical issues.  On exam, abdomen remains tender with clear lungs.  Patient diuresed well overnight and renal function is improving but remains on pressors.  I reviewed CXR myself, ETT is in a good position.  Discussed with resident.  Will change steroids to stress dose.  D/C vancomycin.  Hold further lasix.  AM labs ordered.  F/U on cultures.  Cards following.  PCCM will continue to manage.  The patient is critically ill with multiple organ systems failure and requires high complexity decision making for assessment and support, frequent evaluation and titration of therapies, application of advanced monitoring technologies and extensive interpretation of multiple databases.   Critical Care Time devoted to patient care services described in this note is  32  Minutes. This time reflects time of care of this signee Dr Jennet Maduro. This critical care time does not reflect procedure time, or teaching time or supervisory time of PA/NP/Med student/Med  Resident etc but could involve care discussion time.  Rush Farmer, M.D. [facility] Pulmonary/Critical Care Medicine.

## 2019-01-19 NOTE — Progress Notes (Addendum)
Progress Note  Patient Name: Duane Burns Date of Encounter: 01/19/2019  Primary Cardiologist: Kate Sable, MD   Subjective   Intubated. Following some commands.   Sepsis, NGTD blood cultures or urine cx. Recent admission last month for e.coli bacteremia w/ completion of abx.  Did not have cholecystostomy tube placed with repeat CT showing resolution of pericholecystic fluid and decompression of gallbladder.   Diuresing well with IV lasix with improvement in creatinine 3.4 > 2.8 today. CXR improved from yesterday. CVP 12 yesterday on femoral line. Today right IJ CVP 4 then 3, checked twice.   Blood pressure soft but stable on NE.     Inpatient Medications    Scheduled Meds: . aspirin  81 mg Per Tube Daily  . atorvastatin  80 mg Per Tube q1800  . chlorhexidine gluconate (MEDLINE KIT)  15 mL Mouth Rinse BID  . Chlorhexidine Gluconate Cloth  6 each Topical Daily  . cholecalciferol  1,000 Units Oral Daily  . clopidogrel  75 mg Per Tube Daily  . fentaNYL (SUBLIMAZE) injection  25 mcg Intravenous Once  . LORazepam  1 mg Intravenous Once  . mouth rinse  15 mL Mouth Rinse 10 times per day  . multivitamin with minerals  1 tablet Per Tube Daily  . predniSONE  10 mg Per Tube Daily  . pyridostigmine  60 mg Per Tube TID  . senna-docusate  2 tablet Per Tube BID  . sodium chloride flush  3 mL Intravenous Q12H   Continuous Infusions: . sodium chloride 10 mL/hr at 01/19/19 0300  . famotidine (PEPCID) IV Stopped (01/18/19 2144)  . fentaNYL infusion INTRAVENOUS 175 mcg/hr (01/19/19 0456)  . heparin 1,200 Units/hr (01/19/19 0314)  . meropenem (MERREM) IV 1 g (01/19/19 0511)  . norepinephrine (LEVOPHED) Adult infusion 18 mcg/min (01/18/19 1500)  . vancomycin Stopped (01/18/19 1302)   PRN Meds: sodium chloride, acetaminophen **OR** acetaminophen, bisacodyl, fentaNYL, guaiFENesin, levalbuterol, midazolam, ondansetron **OR** ondansetron (ZOFRAN) IV, polyethylene glycol, sennosides,  sodium chloride flush, traZODone   Vital Signs    Vitals:   01/19/19 0315 01/19/19 0336 01/19/19 0338 01/19/19 0345  BP: (!) 112/55   109/73  Pulse: (!) 58   (!) 59  Resp: (!) 22   (!) 22  Temp:  98.1 F (36.7 C)    TempSrc:  Oral    SpO2: 100%  100% 100%  Weight:      Height:        Intake/Output Summary (Last 24 hours) at 01/19/2019 4098 Last data filed at 01/19/2019 0300 Gross per 24 hour  Intake 1521.13 ml  Output 4445 ml  Net -2923.87 ml   Filed Weights   01/09/2019 1235 01/16/19 0400 01/18/19 1000  Weight: 87.1 kg 83.6 kg 84.7 kg    Telemetry    NSR - Personally Reviewed  ECG    None today - Personally Reviewed  Physical Exam   General:  acutely ill appearing, opens eyes to voice, following some commands HEENT: intubated Neck: supple. no JVD. Carotids 2+ bilat; no bruits.  Cor: PMI nondisplaced. III/VI systolic murmur, +S1 and S2, irregular rate & rhythm.  Lungs: clear Abdomen: soft, TTP, nondistended. No hepatosplenomegaly. No bruits or masses. Good bowel sounds. Extremities: LE cool, no LE edema Neuro: intubated and sedated, opens eyes to voice and physical stimuli, not following commands   Labs    Chemistry Recent Labs  Lab 01/16/19 0230 01/16/19 1024  01/17/19 0229  01/17/19 2148  01/18/19 0400 01/18/19 0434 01/19/19 0339  NA  138 142   < > 139   < > 140   < > 140 139 143  K 4.2 4.8   < > 5.0   < > 4.3   < > 4.2 4.2 3.6  CL 114* 110  --  110  --  111  --  110  --   --   CO2 18* 18*  --  18*  --  16*  --  18*  --   --   GLUCOSE 131* 136*  --  132*  --  161*  --  138*  --   --   BUN 27* 32*  --  44*  --  56*  --  59*  --   --   CREATININE 1.88* 2.44*  --  2.98*  --  3.29*  --  3.40*  --   --   CALCIUM 7.1* 7.6*  --  7.5*  --  7.1*  --  7.3*  --   --   PROT 5.3* 5.6*  --  5.5*  --   --   --   --   --   --   ALBUMIN 2.7* 2.9*  --  2.6*  --   --   --   --   --   --   AST 384* 544*  --  410*  --   --   --   --   --   --   ALT 73* 183*  --  159*   --   --   --   --   --   --   ALKPHOS 53 65  --  72  --   --   --   --   --   --   BILITOT 0.8 0.8  --  1.0  --   --   --   --   --   --   GFRNONAA 33* 24*  --  19*  --  17*  --  16*  --   --   GFRAA 38* 28*  --  22*  --  19*  --  19*  --   --   ANIONGAP 6 14  --  11  --  13  --  12  --   --    < > = values in this interval not displayed.     Hematology Recent Labs  Lab 01/17/19 0229  01/18/19 0400 01/18/19 0434 01/19/19 0339 01/19/19 0345  WBC 52.8*  --  42.7*  --   --  24.4*  RBC 4.12*  --  3.84*  --   --  3.75*  HGB 12.4*   < > 11.6* 10.9* 11.2* 11.1*  HCT 38.4*   < > 35.6* 32.0* 33.0* 33.5*  MCV 93.2  --  92.7  --   --  89.3  MCH 30.1  --  30.2  --   --  29.6  MCHC 32.3  --  32.6  --   --  33.1  RDW 14.3  --  14.4  --   --  14.1  PLT 205  --  185  --   --  121*   < > = values in this interval not displayed.    Cardiac EnzymesNo results for input(s): TROPONINI in the last 168 hours. No results for input(s): TROPIPOC in the last 168 hours.   BNP Recent Labs  Lab 01/16/19 1024  BNP 4,082.7*     DDimer No results  for input(s): DDIMER in the last 168 hours.   Radiology    Dg Abd 1 View  Result Date: 01/17/2019 CLINICAL DATA:  OG tube placement EXAM: ABDOMEN - 1 VIEW COMPARISON:  January 17, 2019 FINDINGS: The OG tube now projects over the gastric body. The endotracheal tube terminates above the carina by approximately 3.2 cm. Multifocal airspace opacities are again noted in the lungs. There is a nonspecific bowel gas pattern. IMPRESSION: Improved OG tube positioning status post repositioning. Electronically Signed   By: Constance Holster M.D.   On: 01/17/2019 23:58   Ct Abd Limited W/o Cm  Result Date: 01/18/2019 CLINICAL DATA:  Sepsis, elevated WBC, gallbladder distension previous CT. Cholecystostomy drain catheter placement requested EXAM: CT ABDOMEN WITHOUT CONTRAST LIMITED TECHNIQUE: Multidetector CT imaging of the abdomen was performed following the standard  protocol without IV contrast. COMPARISON:  01/07/2019 FINDINGS: Lower chest: New small bilateral pleural effusions. New consolidation/atelectasis posteriorly in both lower lobes. Extensive coronary calcifications. Hepatobiliary: The gallbladder is incompletely distended, significantly decompressed from the prior study. No significant pericholecystic inflammatory/edematous changes. No radiodense gallstones. No focal liver lesion evident. No biliary ductal dilatation. Pancreas: Visualized portions unremarkable Spleen: Small, without focal lesion. Adrenals/Urinary Tract: Normal adrenals. Kidneys are incompletely visualized. Stomach/Bowel: Stomach is decompressed by a nasogastric tube. Visualized portions of small bowel and colon unremarkable. Vascular/Lymphatic: Atheromatous aorta. No adenopathy identified. Other: Trace perihepatic and perisplenic ascites. No evident free air. Musculoskeletal: Usual degenerative changes in the visualized lower lumbar spine. IMPRESSION: 1. Interval resolution of gallbladder distension, without worrisome findings. After telephone consultation with Dr. Kae Heller from general surgery, percutaneous cholecystostomy catheter placement was deferred. 2. New bilateral pleural effusions and bibasilar consolidation/atelectasis. 3. Coronary and Aortic Atherosclerosis (ICD10-170.0). Electronically Signed   By: Lucrezia Europe M.D.   On: 01/18/2019 16:58   Dg Chest Port 1 View  Result Date: 01/18/2019 CLINICAL DATA:  Insertion of right central line. EXAM: PORTABLE CHEST 1 VIEW 3:23 p.m. COMPARISON:  01/18/2019 at 5:25 a.m. FINDINGS: New right central venous catheter tip is at the cavoatrial junction 5 cm below the carina in good position. No pneumothorax. Endotracheal tube is in good position 3.6 cm above the carina. NG tube tip is in the distal stomach. The bilateral pulmonary infiltrates have improved since the prior study, likely representing pulmonary edema. Effusions. Aortic atherosclerosis. No  acute bone abnormality. IMPRESSION: 1. New right central venous catheter tip is at the cavoatrial junction. No pneumothorax. 2. Improving bilateral pulmonary infiltrates, likely pulmonary edema. 3. Aortic Atherosclerosis (ICD10-I70.0). Electronically Signed   By: Lorriane Shire M.D.   On: 01/18/2019 15:33   Portable Chest Xray  Result Date: 01/18/2019 CLINICAL DATA:  ET tube present, respiratory failure. EXAM: PORTABLE CHEST 1 VIEW COMPARISON:  Chest radiograph 01/17/2011 FINDINGS: ET tube terminates 2.5 cm above the level of the carina. An enteric tube passes below the level of left hemidiaphragm with tip projecting in the expected location of the stomach. Side port not well-visualized, if present. Stable cardiomegaly. Aortic atherosclerosis. Patchy bilateral airspace opacities are similar to prior examination and may reflect edema or infection. Probable small pleural effusions and atelectasis again noted at the lung bases. No evidence of pneumothorax. IMPRESSION: Lines and tubes as detailed. Stable cardiomegaly. Patchy bilateral airspace opacities are similar to prior examination and may reflect edema or infection. Probable small pleural effusions on atelectasis again noted at the lung bases. Electronically Signed   By: Kellie Simmering DO   On: 01/18/2019 07:49   Dg Chest Port 1 [address]  Result Date: 01/17/2019 CLINICAL DATA:  Intubation. EXAM: PORTABLE CHEST 1 VIEW COMPARISON:  January 17, 2019 FINDINGS: The heart size remains enlarged. The patient has been intubated with the endotracheal tube terminating 4 cm above the carina. The enteric tube tip projects over the gastric body with the side hole projecting over the GE junction/distal esophagus. Aortic calcifications are noted. There appear to be small bilateral pleural effusions. Again noted are perihilar airspace opacities with focal areas of consolidation or atelectasis at the lung bases. There is no pneumothorax. IMPRESSION: 1. Endotracheal tube as  above. 2. The OG tube should be advanced further into the stomach. 3. Persistent multifocal airspace opacities which can be seen in patients with pulmonary edema or an atypical infectious process. 4. Probable small bilateral pleural effusions. Electronically Signed   By: Constance Holster M.D.   On: 01/17/2019 18:43   Dg Abd Portable 1v  Result Date: 01/17/2019 CLINICAL DATA:  OG tube placement. EXAM: PORTABLE ABDOMEN - 1 VIEW COMPARISON:  January 14, 2001 FINDINGS: The OG tube tip projects over the gastric body. The side hole projects over the expected region of the GE junction. The bowel gas pattern is nonspecific with gaseous distention of loops of small bowel and colon scattered throughout the abdomen. There is no evidence for pneumatosis or free air involving the visualized bowel loops. IMPRESSION: 1. Enteric tube as above. Consider advancing the OG tube further into the stomach. 2. Nonspecific bowel gas pattern with mild gaseous distention of loops of small bowel and colon. Electronically Signed   By: Constance Holster M.D.   On: 01/17/2019 18:41    Cardiac Studies   Echo 11/21  1. Left ventricular ejection fraction, by visual estimation, is 20 to 25%. The left ventricle has severely decreased function. There is no left ventricular hypertrophy.  2. Diffuse hypokinesis with most prominent severe hypokinesis of the anteroseptal wall and the entire apex.  3. Global right ventricle was not assessed.The right ventricular size is not assessed. Right vetricular wall thickness was not assessed.  4. Left atrial size was not assessed.  5. Right atrial size was not assessed.  6. The pericardium was not assessed.  7. The mitral valve was not assessed. not assessed mitral valve regurgitation.  8. The tricuspid valve is not assessed. Tricuspid valve regurgitation not assessed.  9. Aortic valve regurgitation Not assessed. 10. The aortic valve was not assessed. Aortic valve regurgitation Not assessed.  11. The pulmonic valve was not assessed. Pulmonic valve regurgitation Not assessed. 12. Not assessed. 13. The interatrial septum was not assessed.  ECHO 11/20 12/2018 LEFT VENTRICLE PLAX 2D LVIDd: 4.17 cm LVIDs: 2.50 cm LV PW: 1.26 cm LV IVS: 1.30 cm LVOT diam: 1.80 cm LV SV: 55 ml LV SV Index: 26.59 LVOT Area: 2.54 cm  06/2018 +------------------+------------++ AORTIC VALVE   +------------------+------------++ AV Area (Vmax): 0.88 cm  +------------------+------------++ AV Area (Vmean): 0.89 cm  +------------------+------------++ AV Area (VTI): 0.90 cm  +------------------+------------++ AV Vmax: 372.81 cm/s  +------------------+------------++ AV Vmean: 255.916 cm/s +------------------+------------++ AV VTI: 0.923 m  +------------------+------------++ AV Peak Grad: 55.6 mmHg  +------------------+------------++ AV Mean Grad: 30.9 mmHg  +------------------+------------++ LVOT Vmax: 86.15 cm/s  +------------------+------------++ LVOT Vmean: 60.100 cm/s  +------------------+------------++ LVOT VTI: 0.220 m  +------------------+------------++ LVOT/AV VTI ratio:0.24  +------------------+------------++ AR PHT: 779 msec  +------------------+------------++    Patient Profile     Duane Burns is a 83yo male with PMH HTN, moderate to severe AS, myasthenia gravis, HLD, recent e. Coli bacteremia treated at [facility] (10/19-10/29) presenting  with sepsis secondary to acute cholecystitis with acute HF and severe aortic stenosis.   Assessment & Plan    Acute decompensated Systolic HF   Acute Respiratory Failure  BNP 4100 11/20. Acute HF 2/2 worsening aortic stenosis in the setting of septic shock. EF 20-25% with severe AS from 60-65% with mod-severe  AS on 06/2018. Intubated 11/22 for worsening respiratory failure and worsening pulm edema on cxr.   - diuresis and creatinine improving with increased lasix - holding lasix for now with lower CVP - CVP 12 yesterday on fem line, 4, then 3 today on IJ after recheck.  - co-ox 62%, still requiring levo 1.9 mcg - continuing levo with map goal >65 - R&L cath once HD stable  - intubated and sedated with vent management per PCCM, failed SBT yesterday. SBT today.  - cont. Vanc/merrem per primary  New Left Bundle Branch Block NSTEMI Trops >27K, new LBBB on EKG, not a candidate for urgent cath due to sepsis requiring pressors. Heparin, asa, plavix with loading dose started. Afib now resolved. - cont. Diuresis, monitor renal function  - R&L cath once improved renal function and HDS with possible PCI.  - no BB or other BP medications due to hypotension requiring levophed, no ACE/ARB with AKI.  - did not receive asa or plavix past two days due to AMS and procedure yesterday - important he receives these today  - cont. IV heparin   Aortic Stenosis Worsened AS since May of this year. Will need R&L heart cath after he is stable and has improvement in renal function and evaluation for TAVR.   AKI 2/2 septic shock, ATN and volume overload. Baseline Cr 1. 1.65 on admission 11/20, trended up to 3.4, improving with IV lasix.  Additionally has history of RAS.  - daily cmp  - holding lasix for now with decreased CVP   Septic Shock  Acute Cholecystitis - resolved on CT  Exact etiology unclear, thought to be cholecystitis but gallbladder decompressed on CT yesterday. Recent e. Coli bacteremia last month, NGTD blood cultures here or urine culture. Very elevated white count likely secondary to chronic prednisone for MG.  - abx per primary  MG - on Prednisone, pyridostigmine. - prednisone switched to stress dose steroids    For questions or updates, please contact [place] Please consult  [email or web address] for contact info under Cardiology/STEMI.      Signed, Marty Heck, DO  01/19/2019, 6:05 AM    Agree with above.  He is awake on vent and able to follow some commands. Went for perc C-tube yesterday but CT not felt to be c/w acute cholecystitis so no drain placed. Remains on NE at 2 (down from 21). SBP in 90s. CVP 2-3. Co-ox ok. CXR is clearing with IV diuresis but still failed SBT this am.   On exam General:  Elderly male on vent will follow some commands HEENT: normal + ETT Neck: supple. no JVD LIJ TLC . Carotids 2+ bilat; no bruits. No lymphadenopathy or thryomegaly appreciated. Cor: PMI nondisplaced. Regular rate & rhythm. No rubs, gallops or murmurs. Lungs: clear Abdomen: soft, nontender, nondistended. No hepatosplenomegaly. No bruits or masses. Good bowel sounds. Extremities: no cyanosis, clubbing, rash, edema Neuro: awake on vent  He remains critically ill. NE being weaned but BP remains soft. Co-ox 63%  Volume status much better with diuresis. CXR clearing but failed SBT likely due to muscle weakness Renal function starting to improve .  Agree with CCM plan to have ID  see regarding recurrent sepsis and possible source. Would consider stress-dose steroids. Suspect vent wean will be difficult with h/o MG. If continues to fail may need to consider treatment with IVIG for possible MG crisis.  Will eventually need R/L cath if/when he is improved and renal function recovers.   CRITICAL CARE Performed by: Glori Bickers  Total critical care time: 35 minutes  Critical care time was exclusive of separately billable procedures and treating other patients.  Critical care was necessary to treat or prevent imminent or life-threatening deterioration.  Critical care was time spent personally by me (independent of midlevel providers or residents) on the following activities: development of treatment plan with patient and/or surrogate as well as nursing, discussions  with consultants, evaluation of patient's response to treatment, examination of patient, obtaining history from patient or surrogate, ordering and performing treatments and interventions, ordering and review of laboratory studies, ordering and review of radiographic studies, pulse oximetry and re-evaluation of patient's condition.  Glori Bickers, MD  4:06 PM

## 2019-01-19 NOTE — Progress Notes (Signed)
Called [facility] about low C02 on ABG.  RN is reporting to MD and will advise if changes to ventilator are suggested.

## 2019-01-19 NOTE — Progress Notes (Signed)
[name] Progress Note Patient Name: Duane Burns DOB: Feb 21, 1936 MRN: [id number]   Date of Service  01/19/2019  HPI/Events of Note  Notified of ABG 7.4/26/118 on PRVC 22/540 (8cc/kgIBW)/40%5 PEEP  eICU Interventions  Decrease rate to 20 as patient not breathing over set rate despite pH being normal. Decrease TV to 7 cc/kg IBW and continue to target 6 cc/kg IBW.     Intervention Category Major Interventions: Acid-Base disturbance - evaluation and management  Judd Lien 01/19/2019, 3:55 AM

## 2019-01-19 NOTE — Progress Notes (Signed)
ANTICOAGULATION CONSULT NOTE Pharmacy Consult for:  Heparin Indication: ACS/STEMI  No Known Allergies  Patient Measurements: Height: 5\' 8"  (172.7 cm) Weight: 186 lb 11.7 oz (84.7 kg)(bed weight ) IBW/kg (Calculated) : 68.4 HEPARIN DW (KG): 86 kg  Vital Signs: Temp: 99.1 F (37.3 C) (11/23 2330) Temp Source: Oral (11/23 2330) BP: 98/67 (11/24 0245) Pulse Rate: 55 (11/24 0245)  Labs: Recent Labs    01/16/19 0442  01/16/19 1024  01/17/19 0229 01/17/19 1442  01/17/19 2148 01/18/19 0016 01/18/19 0400 01/18/19 0434 01/19/19 0132  HGB  --   --  12.7*   < > 12.4*  --    < >  --  11.6* 11.6* 10.9*  --   HCT  --   --  40.6   < > 38.4*  --    < >  --  34.0* 35.6* 32.0*  --   PLT  --   --  253  --  205  --   --   --   --  185  --   --   LABPROT  --   --   --   --   --  16.9*  --   --   --   --   --   --   INR  --   --   --   --   --  1.4*  --   --   --   --   --   --   HEPARINUNFRC  --   --  1.09*   < > 0.54  --   --   --   --  <0.10*  --  <0.10*  CREATININE  --    < > 2.44*  --  2.98*  --   --  3.29*  --  3.40*  --   --   TROPONINIHS >26,548.0*  --   --   --  >27,000*  --   --   --   --   --   --   --    < > = values in this interval not displayed.    Estimated Creatinine Clearance: 18.1 mL/min (A) (by C-G formula based on SCr of 3.4 mg/dL (H)).   Assessment: 83 yo male with NSTEMI for heparin.   Goal of Therapy:  Heparin level 0.3-0.7 units/ml Monitor platelets by anticoagulation protocol: Yes   Plan:  Increase Heparin 1200 units/hr Check heparin level in 8 hours.  Phillis Knack, PharmD, BCPS  01/19/2019 2:57 AM

## 2019-01-19 NOTE — Progress Notes (Signed)
eLink Physician-Brief Progress Note Patient Name: Duane Burns DOB: July 26, 1935 MRN: [id number]   Date of Service  01/19/2019  HPI/Events of Note  Bradycardia into the 40's on Precedex. No hypotension yet.  eICU Interventions  Precedex discontinued, Fentanyl infusion started for sedation and analgesia.        [name] 01/19/2019, 9:07 PM

## 2019-01-19 NOTE — Progress Notes (Signed)
ANTICOAGULATION CONSULT NOTE Pharmacy Consult for:  Heparin Indication: ACS/STEMI  No Known Allergies  Patient Measurements: Height: 5\' 8"  (172.7 cm) Weight: 175 lb 7.8 oz (79.6 kg) IBW/kg (Calculated) : 68.4 HEPARIN DW (KG): 79.6 kg  Vital Signs: Temp: 97.8 F (36.6 C) (11/24 1950) Temp Source: Oral (11/24 1950) BP: 106/58 (11/24 2100) Pulse Rate: 48 (11/24 2100)  Labs: Recent Labs    01/17/19 0229 01/17/19 1442  01/17/19 2148  01/18/19 0400 01/18/19 0434 01/19/19 0132 01/19/19 0339 01/19/19 0345 01/19/19 1134 01/19/19 2043  HGB 12.4*  --    < >  --    < > 11.6* 10.9*  --  11.2* 11.1*  --   --   HCT 38.4*  --    < >  --    < > 35.6* 32.0*  --  33.0* 33.5*  --   --   PLT 205  --   --   --   --  185  --   --   --  121*  --   --   LABPROT  --  16.9*  --   --   --   --   --   --   --   --   --   --   INR  --  1.4*  --   --   --   --   --   --   --   --   --   --   HEPARINUNFRC 0.54  --   --   --   --  <0.10*  --  <0.10*  --   --  <0.10* 0.82*  CREATININE 2.98*  --   --  3.29*  --  3.40*  --   --   --  2.87*  --   --   TROPONINIHS >27,000*  --   --   --   --   --   --   --   --   --   --   --    < > = values in this interval not displayed.    Estimated Creatinine Clearance: 19.5 mL/min (A) (by C-G formula based on SCr of 2.87 mg/dL (H)).   Assessment: 83 yo male with NSTEMI and elevated troponin. Pharmacy asked to dose heparin. No anticoagulation prior to admission. -heparin level= 0.82 on 1400 units/hr   Goal of Therapy:  Heparin level 0.3-0.7 units/ml Monitor platelets by anticoagulation protocol: Yes   Plan:  -Decrease heparin to 1300 units/ht -Daily heparin level and CBC  Hildred Laser, PharmD Clinical Pharmacist **Pharmacist phone directory can now be found on [email or web address] ([email or web address]).  Listed under [place].

## 2019-01-19 NOTE — Progress Notes (Signed)
Chaplain engaged in follow-up visit with Duane Burns and his wife, offering spiritual presence and support.  Chaplain will continue to follow-up.

## 2019-01-19 NOTE — Progress Notes (Signed)
[facility] Progress Note     Subjective: Patient on the vent. Opens eyes, did not follow commands for me. Moving bilateral upper extremities.   Objective: Vital signs in last 24 hours: Temp:  [97.4 F (36.3 C)-99.1 F (37.3 C)] 98.1 F (36.7 C) (11/24 0336) Pulse Rate:  [53-76] 59 (11/24 0700) Resp:  [17-23] 22 (11/24 0700) BP: (87-145)/(49-103) 107/60 (11/24 0700) SpO2:  [93 %-100 %] 100 % (11/24 0700) FiO2 (%):  [40 %] 40 % (11/24 0338) Weight:  [79.6 kg-84.7 kg] 79.6 kg (11/24 0620) Last BM Date: 01/16/19  Intake/Output from previous day: 11/23 0701 - 11/24 0700 In: 1788.4 [I.V.:1218.5; NG/GT:120; IV Piggyback:449.9] Out: [id number] [Urine:4705; Emesis/NG output:225] Intake/Output this shift: No intake/output data recorded.  PE: Gen: on the vent, seems agitated Card:  Regular rate and rhythm, pedal pulses 2+ BL Pulm:  Ventilator sounds bilaterally  Abd: Soft, non-tender, non-distended, +BS, no guarding  Skin: warm and dry, no rashes    Lab Results:  Recent Labs    01/18/19 0400  01/19/19 0339 01/19/19 0345  WBC 42.7*  --   --  24.4*  HGB 11.6*   < > 11.2* 11.1*  HCT 35.6*   < > 33.0* 33.5*  PLT 185  --   --  121*   < > = values in this interval not displayed.   BMET Recent Labs    01/18/19 0400  01/19/19 0339 01/19/19 0345  NA 140   < > 143 144  K 4.2   < > 3.6 3.4*  CL 110  --   --  109  CO2 18*  --   --  22  GLUCOSE 138*  --   --  84  BUN 59*  --   --  57*  CREATININE 3.40*  --   --  2.87*  CALCIUM 7.3*  --   --  7.9*   < > = values in this interval not displayed.   PT/INR Recent Labs    01/17/19 1442  LABPROT 16.9*  INR 1.4*   CMP     Component Value Date/Time   NA 144 01/19/2019 0345   NA 142 01/27/2018 0911   K 3.4 (L) 01/19/2019 0345   CL 109 01/19/2019 0345   CO2 22 01/19/2019 0345   GLUCOSE 84 01/19/2019 0345   BUN 57 (H) 01/19/2019 0345   BUN 18 01/27/2018 0911   CREATININE 2.87 (H) 01/19/2019 0345   CALCIUM 7.9  (L) 01/19/2019 0345   PROT 5.1 (L) 01/19/2019 0345   PROT 6.2 01/27/2018 0911   ALBUMIN 2.4 (L) 01/19/2019 0345   ALBUMIN 4.2 01/27/2018 0911   AST 78 (H) 01/19/2019 0345   ALT 88 (H) 01/19/2019 0345   ALKPHOS 82 01/19/2019 0345   BILITOT 2.5 (H) 01/19/2019 0345   BILITOT 0.7 01/27/2018 0911   GFRNONAA 20 (L) 01/19/2019 0345   GFRAA 23 (L) 01/19/2019 0345   Lipase     Component Value Date/Time   LIPASE 462 (H) 01/17/2019 0821       Studies/Results: Dg Abd 1 View  Result Date: 01/17/2019 CLINICAL DATA:  OG tube placement EXAM: ABDOMEN - 1 VIEW COMPARISON:  January 17, 2019 FINDINGS: The OG tube now projects over the gastric body. The endotracheal tube terminates above the carina by approximately 3.2 cm. Multifocal airspace opacities are again noted in the lungs. There is a nonspecific bowel gas pattern. IMPRESSION: Improved OG tube positioning status post repositioning. Electronically Signed   By: Harrell Gave  Green M.D.   On: 01/17/2019 23:58   Ct Abd Limited W/o Cm  Result Date: 01/18/2019 CLINICAL DATA:  Sepsis, elevated WBC, gallbladder distension previous CT. Cholecystostomy drain catheter placement requested EXAM: CT ABDOMEN WITHOUT CONTRAST LIMITED TECHNIQUE: Multidetector CT imaging of the abdomen was performed following the standard protocol without IV contrast. COMPARISON:  01/10/2019 FINDINGS: Lower chest: New small bilateral pleural effusions. New consolidation/atelectasis posteriorly in both lower lobes. Extensive coronary calcifications. Hepatobiliary: The gallbladder is incompletely distended, significantly decompressed from the prior study. No significant pericholecystic inflammatory/edematous changes. No radiodense gallstones. No focal liver lesion evident. No biliary ductal dilatation. Pancreas: Visualized portions unremarkable Spleen: Small, without focal lesion. Adrenals/Urinary Tract: Normal adrenals. Kidneys are incompletely visualized. Stomach/Bowel: Stomach is  decompressed by a nasogastric tube. Visualized portions of small bowel and colon unremarkable. Vascular/Lymphatic: Atheromatous aorta. No adenopathy identified. Other: Trace perihepatic and perisplenic ascites. No evident free air. Musculoskeletal: Usual degenerative changes in the visualized lower lumbar spine. IMPRESSION: 1. Interval resolution of gallbladder distension, without worrisome findings. After telephone consultation with Dr. Kae Heller from general surgery, percutaneous cholecystostomy catheter placement was deferred. 2. New bilateral pleural effusions and bibasilar consolidation/atelectasis. 3. Coronary and Aortic Atherosclerosis (ICD10-170.0). Electronically Signed   By: Lucrezia Europe M.D.   On: 01/18/2019 16:58   Dg Chest Port 1 View  Result Date: 01/18/2019 CLINICAL DATA:  Insertion of right central line. EXAM: PORTABLE CHEST 1 VIEW 3:23 p.m. COMPARISON:  01/18/2019 at 5:25 a.m. FINDINGS: New right central venous catheter tip is at the cavoatrial junction 5 cm below the carina in good position. No pneumothorax. Endotracheal tube is in good position 3.6 cm above the carina. NG tube tip is in the distal stomach. The bilateral pulmonary infiltrates have improved since the prior study, likely representing pulmonary edema. Effusions. Aortic atherosclerosis. No acute bone abnormality. IMPRESSION: 1. New right central venous catheter tip is at the cavoatrial junction. No pneumothorax. 2. Improving bilateral pulmonary infiltrates, likely pulmonary edema. 3. Aortic Atherosclerosis (ICD10-I70.0). Electronically Signed   By: Lorriane Shire M.D.   On: 01/18/2019 15:33   Portable Chest Xray  Result Date: 01/18/2019 CLINICAL DATA:  ET tube present, respiratory failure. EXAM: PORTABLE CHEST 1 VIEW COMPARISON:  Chest radiograph 01/17/2011 FINDINGS: ET tube terminates 2.5 cm above the level of the carina. An enteric tube passes below the level of left hemidiaphragm with tip projecting in the expected location of  the stomach. Side port not well-visualized, if present. Stable cardiomegaly. Aortic atherosclerosis. Patchy bilateral airspace opacities are similar to prior examination and may reflect edema or infection. Probable small pleural effusions and atelectasis again noted at the lung bases. No evidence of pneumothorax. IMPRESSION: Lines and tubes as detailed. Stable cardiomegaly. Patchy bilateral airspace opacities are similar to prior examination and may reflect edema or infection. Probable small pleural effusions on atelectasis again noted at the lung bases. Electronically Signed   By: Kellie Simmering DO   On: 01/18/2019 07:49   Dg Chest Port 1 View  Result Date: 01/17/2019 CLINICAL DATA:  Intubation. EXAM: PORTABLE CHEST 1 VIEW COMPARISON:  January 17, 2019 FINDINGS: The heart size remains enlarged. The patient has been intubated with the endotracheal tube terminating 4 cm above the carina. The enteric tube tip projects over the gastric body with the side hole projecting over the GE junction/distal esophagus. Aortic calcifications are noted. There appear to be small bilateral pleural effusions. Again noted are perihilar airspace opacities with focal areas of consolidation or atelectasis at the lung bases. There is no  pneumothorax. IMPRESSION: 1. Endotracheal tube as above. 2. The OG tube should be advanced further into the stomach. 3. Persistent multifocal airspace opacities which can be seen in patients with pulmonary edema or an atypical infectious process. 4. Probable small bilateral pleural effusions. Electronically Signed   By: Constance Holster M.D.   On: 01/17/2019 18:43   Dg Abd Portable 1v  Result Date: 01/17/2019 CLINICAL DATA:  OG tube placement. EXAM: PORTABLE ABDOMEN - 1 VIEW COMPARISON:  January 14, 2001 FINDINGS: The OG tube tip projects over the gastric body. The side hole projects over the expected region of the GE junction. The bowel gas pattern is nonspecific with gaseous distention of  loops of small bowel and colon scattered throughout the abdomen. There is no evidence for pneumatosis or free air involving the visualized bowel loops. IMPRESSION: 1. Enteric tube as above. Consider advancing the OG tube further into the stomach. 2. Nonspecific bowel gas pattern with mild gaseous distention of loops of small bowel and colon. Electronically Signed   By: Constance Holster M.D.   On: 01/17/2019 18:41    Anti-infectives: Anti-infectives (From admission, onward)   Start     Dose/Rate Route Frequency Ordered Stop   01/17/19 1200  vancomycin (VANCOCIN) IVPB 1000 mg/200 mL premix     1,000 mg 200 mL/hr over 60 Minutes Intravenous Every 24 hours 01/17/19 1018     01/17/19 0803  vancomycin variable dose per unstable renal function (pharmacist dosing)  Status:  Discontinued      Does not apply See admin instructions 01/17/19 0803 01/17/19 1018   01/16/19 1200  vancomycin (VANCOCIN) IVPB 1000 mg/200 mL premix  Status:  Discontinued     1,000 mg 200 mL/hr over 60 Minutes Intravenous Every 24 hours 01/04/2019 1753 01/17/19 0803   12/30/2018 1800  meropenem (MERREM) 1 g in sodium chloride 0.9 % 100 mL IVPB     1 g 200 mL/hr over 30 Minutes Intravenous Every 12 hours 12/30/2018 1751     01/14/2019 1300  vancomycin (VANCOCIN) IVPB 1000 mg/200 mL premix     1,000 mg 200 mL/hr over 60 Minutes Intravenous  Once 01/20/2019 1247 01/17/2019 1404       Assessment/Plan NSTEMI with cardiogenic shock - on levo Hx of myasthenia gravis on chronic steroids HTN HLD AKI - Cr 2.87 this AM  Leukocytosis - WBC 24 from 42, afebrile Concern for acute cholecystitis - CT yesterday with gallbladder now decompressed and without signs of cholecystitis - decided against placement of percutaneous cholecystostomy - patient does not appear to be tender on abdominal exam - I do not think patient has acute cholecystitis at this time, if concern for this arises would recommend HIDA   No other general surgery  recommendations at this time  LOS: 4 days    Brigid Re , [facility] 01/19/2019, 7:37 AM Please see Amion for pager number during day hours 7:00am-4:30pm

## 2019-01-20 ENCOUNTER — Inpatient Hospital Stay (HOSPITAL_COMMUNITY): Payer: Medicare HMO

## 2019-01-20 LAB — BASIC METABOLIC PANEL
Anion gap: 11 (ref 5–15)
BUN: 64 mg/dL — ABNORMAL HIGH (ref 8–23)
CO2: 22 mmol/L (ref 22–32)
Calcium: 7.8 mg/dL — ABNORMAL LOW (ref 8.9–10.3)
Chloride: 110 mmol/L (ref 98–111)
Creatinine, Ser: 2.23 mg/dL — ABNORMAL HIGH (ref 0.61–1.24)
GFR calc Af Amer: 31 mL/min — ABNORMAL LOW (ref >60)
GFR calc non Af Amer: 27 mL/min — ABNORMAL LOW (ref >60)
Glucose, Bld: 160 mg/dL — ABNORMAL HIGH (ref 70–99)
Potassium: 4 mmol/L (ref 3.5–5.1)
Sodium: 143 mmol/L (ref 135–145)

## 2019-01-20 LAB — GLUCOSE, CAPILLARY
Glucose-Capillary: 136 mg/dL — ABNORMAL HIGH (ref 70–99)
Glucose-Capillary: 151 mg/dL — ABNORMAL HIGH (ref 70–99)
Glucose-Capillary: 152 mg/dL — ABNORMAL HIGH (ref 70–99)
Glucose-Capillary: 156 mg/dL — ABNORMAL HIGH (ref 70–99)
Glucose-Capillary: 159 mg/dL — ABNORMAL HIGH (ref 70–99)
Glucose-Capillary: 180 mg/dL — ABNORMAL HIGH (ref 70–99)

## 2019-01-20 LAB — CBC
HCT: 32.1 % — ABNORMAL LOW (ref 39.0–52.0)
Hemoglobin: 10.4 g/dL — ABNORMAL LOW (ref 13.0–17.0)
MCH: 29.4 pg (ref 26.0–34.0)
MCHC: 32.4 g/dL (ref 30.0–36.0)
MCV: 90.7 fL (ref 80.0–100.0)
Platelets: 105 10*3/uL — ABNORMAL LOW (ref 150–400)
RBC: 3.54 MIL/uL — ABNORMAL LOW (ref 4.22–5.81)
RDW: 14.1 % (ref 11.5–15.5)
WBC: 23.2 10*3/uL — ABNORMAL HIGH (ref 4.0–10.5)
nRBC: 0 % (ref 0.0–0.2)

## 2019-01-20 LAB — CULTURE, BLOOD (ROUTINE X 2)
Culture: NO GROWTH
Culture: NO GROWTH
Special Requests: ADEQUATE

## 2019-01-20 LAB — POCT I-STAT 7, (LYTES, BLD GAS, ICA,H+H)
Acid-base deficit: 2 mmol/L (ref 0.0–2.0)
Bicarbonate: 20.2 mmol/L (ref 20.0–28.0)
Calcium, Ion: 1.17 mmol/L (ref 1.15–1.40)
HCT: 44 % (ref 39.0–52.0)
Hemoglobin: 15 g/dL (ref 13.0–17.0)
O2 Saturation: 100 %
Patient temperature: 98
Potassium: 4.1 mmol/L (ref 3.5–5.1)
Sodium: 142 mmol/L (ref 135–145)
TCO2: 21 mmol/L — ABNORMAL LOW (ref 22–32)
pCO2 arterial: 27.2 mmHg — ABNORMAL LOW (ref 32.0–48.0)
pH, Arterial: 7.478 — ABNORMAL HIGH (ref 7.350–7.450)
pO2, Arterial: 185 mmHg — ABNORMAL HIGH (ref 83.0–108.0)

## 2019-01-20 LAB — COOXEMETRY PANEL
Carboxyhemoglobin: 0.5 % (ref 0.5–1.5)
Methemoglobin: 0.8 % (ref 0.0–1.5)
O2 Saturation: 62.5 %
Total hemoglobin: 11 g/dL — ABNORMAL LOW (ref 12.0–16.0)

## 2019-01-20 LAB — PHOSPHORUS
Phosphorus: 3.8 mg/dL (ref 2.5–4.6)
Phosphorus: 4.1 mg/dL (ref 2.5–4.6)

## 2019-01-20 LAB — HEPARIN LEVEL (UNFRACTIONATED)
Heparin Unfractionated: 1.03 IU/mL — ABNORMAL HIGH (ref 0.30–0.70)
Heparin Unfractionated: 1.14 IU/mL — ABNORMAL HIGH (ref 0.30–0.70)

## 2019-01-20 MED ORDER — HEPARIN (PORCINE) 25000 UT/250ML-% IV SOLN
650.0000 [IU]/h | INTRAVENOUS | Status: DC
  Administered 2019-01-20: 900 [IU]/h via INTRAVENOUS

## 2019-01-20 MED ORDER — FUROSEMIDE 10 MG/ML IJ SOLN: 80.0000 mg | Freq: Once | INTRAMUSCULAR | Status: DC

## 2019-01-20 MED ORDER — FUROSEMIDE 10 MG/ML IJ SOLN
40.0000 mg | Freq: Four times a day (QID) | INTRAMUSCULAR | Status: AC
  Administered 2019-01-20 (×3): 40 mg via INTRAVENOUS
  Filled 2019-01-20 (×3): qty 4

## 2019-01-20 MED ORDER — HEPARIN (PORCINE) 25000 UT/250ML-% IV SOLN
1100.0000 [IU]/h | INTRAVENOUS | Status: DC
  Administered 2019-01-20: 1100 [IU]/h via INTRAVENOUS
  Filled 2019-01-20: qty 250

## 2019-01-20 MED ORDER — METOLAZONE 5 MG PO TABS
10.0000 mg | ORAL_TABLET | Freq: Once | ORAL | Status: AC
  Administered 2019-01-20: 10 mg via ORAL
  Filled 2019-01-20: qty 2

## 2019-01-20 NOTE — Final Consult Note (Signed)
Consultant Final Sign-Off Note    Assessment/Final recommendations  Duane Burns is a 83 y.o. male followed by me for possible cholecystitis. Recommended IR drain if gallbladder suspected to be source of shock. On follow up CT, gallbladder was decompressed and not concerning for acute cholecystitis. Patient has no abdominal pain currently and is tolerating tube feeds.    Wound care (if applicable): None   Diet at discharge: per primary team   Activity at discharge: per primary team   Follow-up appointment:  None   Pending results:  Unresulted Labs (From admission, onward)    Start     Ordered   01/20/19 1400  Heparin level  Once-Timed,   TIMED    Question:  Specimen collection method  Answer:  Unit=Unit collect   01/20/19 0516   01/20/19 0500  ABG AM  Tomorrow morning,   R     01/19/19 1028   01/20/19 0500  Heparin level (unfractionated)  Daily,   R    Question:  Specimen collection method  Answer:  Unit=Unit collect   01/19/19 1238   01/19/19 1457  Phosphorus  (ICU Tube Feeding: PEPuP )  5A & 5P,   R    Question:  Specimen collection method  Answer:  Unit=Unit collect   01/19/19 1456   01/19/19 0500  ABG AM  Tomorrow morning,   R     01/18/19 0903   01/19/19 0500  .Cooxemetry Panel (carboxy, met, total hgb, O2 sat)  Daily,   R    Question:  Specimen collection method  Answer:  Unit=Unit collect   01/18/19 1654   01/18/19 0500  Blood gas, arterial  Tomorrow morning,   R     01/18/19 0415   01/18/19 0100  Blood gas, arterial  Once,   R     01/17/19 2052   01/17/19 0500  CBC  Daily,   R    Question:  Specimen collection method  Answer:  Unit=Unit collect   01/16/19 1004           Medication recommendations: None    Other recommendations: None     Thank you for allowing [place] to participate in the care of your patient!  Please consult [place] again if you have further needs for your patient.  Claiborne Billings Rayburn 01/20/2019 7:33 AM    Subjective   Patient is alert on the  vent this AM and able to respond to questions. Denies abdominal pain or nausea. Tolerating TF.   Objective  Vital signs in last 24 hours: Temp:  [97.8 F (36.6 C)-99 F (37.2 C)] 98.5 F (36.9 C) (11/25 0400) Pulse Rate:  [37-107] 82 (11/25 0700) Resp:  [15-24] 22 (11/25 0700) BP: (74-134)/(50-85) 95/57 (11/25 0700) SpO2:  [89 %-100 %] 100 % (11/25 0700) FiO2 (%):  [40 %] 40 % (11/25 0312)  PE: Gen: alert on the vent, appears comfortable Card:  irregularly irregular, rate in the 70s, pedal pulses 2+ BL Pulm:  CTAB  Abd: Soft, non-tender, non-distended, +BS, no guarding  Skin: warm and dry, no rashes    Pertinent labs and Studies: Recent Labs    01/18/19 0400  01/19/19 0345 01/20/19 0318 01/20/19 0408  WBC 42.7*  --  24.4*  --  23.2*  HGB 11.6*   < > 11.1* 15.0 10.4*  HCT 35.6*   < > 33.5* 44.0 32.1*   < > = values in this interval not displayed.   BMET Recent Labs    01/19/19 0345 01/20/19  2330 01/20/19 0408  NA 144 142 143  K 3.4* 4.1 4.0  CL 109  --  110  CO2 22  --  22  GLUCOSE 84  --  160*  BUN 57*  --  64*  CREATININE 2.87*  --  2.23*  CALCIUM 7.9*  --  7.8*   No results for input(s): LABURIN in the last 72 hours. Results for orders placed or performed during the hospital encounter of 01/10/2019  Urine culture     Status: None   Collection Time: 01/14/2019 11:47 AM   Specimen: In/Out Cath Urine  Result Value Ref Range Status   Specimen Description   Final    IN/OUT CATH URINE Performed at [facility], [address] [place], [place] [address]    Special Requests   Final    NONE Performed at [facility], [address] [place], [place] [address]    Culture   Final    NO GROWTH Performed at [facility], [place] [address], [place], [place] [address]    Report Status 01/16/2019 FINAL  Final  Blood Culture (routine x 2)     Status: None (Preliminary result)   Collection Time: 01/24/2019 12:07 PM   Specimen: BLOOD RIGHT FOREARM  Result Value Ref  Range Status   Specimen Description BLOOD RIGHT FOREARM DRAWN BY RN  Final   Special Requests   Final    BOTTLES DRAWN AEROBIC AND ANAEROBIC Blood Culture adequate volume   Culture   Final    NO GROWTH 4 DAYS Performed at [facility], [address], [place], [place] [address]    Report Status PENDING  Incomplete  Blood Culture (routine x 2)     Status: None (Preliminary result)   Collection Time: 01/10/2019 12:17 PM   Specimen: BLOOD LEFT HAND  Result Value Ref Range Status   Specimen Description BLOOD LEFT HAND  Final   Special Requests   Final    BOTTLES DRAWN AEROBIC AND ANAEROBIC Blood Culture results may not be optimal due to an inadequate volume of blood received in culture bottles   Culture   Final    NO GROWTH 4 DAYS Performed at [facility], [address], [place], [place] [address]    Report Status PENDING  Incomplete  SARS Coronavirus 2 by RT PCR (hospital order, performed in [place] hospital lab) Nasopharyngeal Nasopharyngeal Swab     Status: None   Collection Time: 01/20/2019  1:05 PM   Specimen: Nasopharyngeal Swab  Result Value Ref Range Status   SARS Coronavirus 2 NEGATIVE NEGATIVE Final    Comment: (NOTE) If result is NEGATIVE SARS-CoV-2 target nucleic acids are NOT DETECTED. The SARS-CoV-2 RNA is generally detectable in upper and lower  respiratory specimens during the acute phase of infection. The lowest  concentration of SARS-CoV-2 viral copies this assay can detect is 250  copies / mL. A negative result does not preclude SARS-CoV-2 infection  and should not be used as the sole basis for treatment or other  patient management decisions.  A negative result may occur with  improper specimen collection / handling, submission of specimen other  than nasopharyngeal swab, presence of viral mutation(s) within the  areas targeted by this assay, and inadequate number of viral copies  (<250 copies / mL). A negative result must be combined with clinical   observations, patient history, and epidemiological information. If result is POSITIVE SARS-CoV-2 target nucleic acids are DETECTED. The SARS-CoV-2 RNA is generally detectable in upper  and lower  respiratory specimens dur ing the acute phase of infection.  Positive  results are indicative of active infection with SARS-CoV-2.  Clinical  correlation with patient history and other diagnostic information is  necessary to determine patient infection status.  Positive results do  not rule out bacterial infection or co-infection with other viruses. If result is PRESUMPTIVE POSTIVE SARS-CoV-2 nucleic acids MAY BE PRESENT.   A presumptive positive result was obtained on the submitted specimen  and confirmed on repeat testing.  While 2019 novel coronavirus  (SARS-CoV-2) nucleic acids may be present in the submitted sample  additional confirmatory testing may be necessary for epidemiological  and / or clinical management purposes  to differentiate between  SARS-CoV-2 and other Sarbecovirus currently known to infect humans.  If clinically indicated additional testing with an alternate test  methodology [phone]) is advised. The SARS-CoV-2 RNA is generally  detectable in upper and lower respiratory sp ecimens during the acute  phase of infection. The expected result is Negative. Fact Sheet for Patients:  StrictlyIdeas.no Fact Sheet for Healthcare Providers: BankingDealers.co.za This test is not yet approved or cleared by the [place] FDA and has been authorized for detection and/or diagnosis of SARS-CoV-2 by FDA under an Emergency Use Authorization (EUA).  This EUA will remain in effect (meaning this test can be used) for the duration of the COVID-19 declaration under Section 564(b)(1) of the Act, 21 U.S.C. section 360bbb-3(b)(1), unless the authorization is terminated or revoked sooner. Performed at [facility], [address]  [place], [place] [address]   MRSA PCR Screening     Status: None   Collection Time: 01/08/2019  4:35 PM   Specimen: Nasal Mucosa; Nasopharyngeal  Result Value Ref Range Status   MRSA by PCR NEGATIVE NEGATIVE Final    Comment:        The GeneXpert MRSA Assay (FDA approved for NASAL specimens only), is one component of a comprehensive MRSA colonization surveillance program. It is not intended to diagnose MRSA infection nor to guide or monitor treatment for MRSA infections. Performed at [facility], [address], [place], [place] [address]   Culture, blood (routine x 2)     Status: None (Preliminary result)   Collection Time: 01/17/19  8:37 AM   Specimen: BLOOD LEFT HAND  Result Value Ref Range Status   Specimen Description BLOOD LEFT HAND  Final   Special Requests   Final    AEROBIC BOTTLE ONLY Blood Culture results may not be optimal due to an inadequate volume of blood received in culture bottles   Culture   Final    NO GROWTH 2 DAYS Performed at [facility], [place] [address] [place], [place] [address]    Report Status PENDING  Incomplete  Culture, blood (routine x 2)     Status: None (Preliminary result)   Collection Time: 01/17/19  8:51 AM   Specimen: BLOOD RIGHT HAND  Result Value Ref Range Status   Specimen Description BLOOD RIGHT HAND  Final   Special Requests AEROBIC BOTTLE ONLY Blood Culture adequate volume  Final   Culture   Final    NO GROWTH 2 DAYS Performed at [facility], [place] [address] [place], [place] [address]    Report Status PENDING  Incomplete    Imaging: No results found.

## 2019-01-20 NOTE — Progress Notes (Signed)
NAME:  Duane Burns, MRN:  [id number], DOB:  [birth year], LOS: 5 ADMISSION DATE:  12/30/2018,  CHIEF COMPLAINT:  Chest pain  Brief History   83 yo male who initally presented to [facility] with findings consistent with sepsis. Recent e. Coli bacteremia treated at OSH. While at [facility], patient developed chest pain with multiple PACs and PVCs and was subsequently transferred to [facility] for further evaluation and treatment. On arrival, trops>27k. Lactate 3.7. white count 37k. No ST elevation on initial EKG however did develop new LBBB on later EKG. Cardiology consulted and spoke with intervential cardiology. Given septic state, renal failure, was not candidate for cath lab.  Imaging suggestive of biliary source of sepsis with cholecytitis present. Not a candidate for lap chole at that time so IR consulted with plans for perc cholecystotomy tube place 11/23 however on repeat imaging, gallbladder distension was resolved so tube placement was deferred.  Past Medical History  Myasthenia Gravis HTN Prostate cancer HLD RAS   Significant Hospital Events   11/20 admitted to [facility] with sepsis 11/21 transferred to [facility] for chest pain and EKG changes  Consults:  Cardiology gen surg IR Procedures:  11/20 CVC 11/22 Intubation 11/23 central IJ line placement  Significant Diagnostic Tests:  11/20 echo: EF 30-35%. Severely decreased LV function. Mild LVH. 11/21 echo: EF 20-25% with severely decreased LV function. No LVH. Diffuse hypokinesis most prominently of the anteroseptal wall and entire apex  11/20 CT c/a/p:pericholecystic edema. Left renal stone  11/21 abd [place]: thickened gallbladder wall with gallstones/sludge. Suspicious for cholecystitis. 11/23 CT a/p: interval resolution of gallbladder distension. New bilateral pleural effusions and bibasilar consolidation  11/21 CXR: worsened CHF pattern 11/22 CXR: progressively worsened pulm edema. Slight increase in  cardiomegaly 11/23 CXR s/p central line placement: improving bilateral pulm infiltrates.  11/24 CXR: pulm vasc congestion improving. Mild bibasilar airspace disease atelectasis vs infection  Micro Data:  11/20 BC>>NGTD  Antimicrobials:  merrem 11/21>> vanc 11/21>> Interim history/subjective:  Failed SBT yesterday due to mental status Restless last evening despite fentynl. D/c'd fentanyl and placed on precedex however patient became more agitated and was placed back on fentanyl  Objective   Blood pressure 99/65, pulse 68, temperature 98.5 F (36.9 C), temperature source Axillary, resp. rate (!) 22, height 5\' 8"  (1.727 m), weight 79.6 kg, SpO2 100 %. CVP:  [4 mmHg] 4 mmHg  Vent Mode: PRVC FiO2 (%):  [40 %] 40 % Set Rate:  [22 bmp] 22 bmp Vt Set:  [540 mL] 540 mL PEEP:  [5 cmH20] 5 cmH20 Pressure Support:  [10 cmH20] 10 cmH20 Plateau Pressure:  [10 cmH20-20 cmH20] 16 cmH20   Intake/Output Summary (Last 24 hours) at 01/20/2019 0616 Last data filed at 01/20/2019 0600 Gross per 24 hour  Intake 1673.14 ml  Output 1790 ml  Net -116.86 ml   Filed Weights   01/16/19 0400 01/18/19 1000 01/19/19 0620  Weight: 83.6 kg 84.7 kg 79.6 kg    Examination: General: sedated HEENT: ETT in place Lungs: lung sounds improved.mild crackles bilaterally Cardiovascular: heart regular rate. Extremities warm Abdomen: non distended. bs active Extremities: warm Neuro: following some commands but mostly restless. GU: foley cath  Resolved Hospital Problem list   none  Assessment & Plan:  Acute respiratory failure. Likely multifactorial in setting of pulm edema with possible pneumonia. Vascular congestion improving. Mental status barring extubation Plan:  VAP protocol. Continue SBTs with goal to wean  Septic shock.  Blood cultures from 11/20 NGTD.  White count continuing to trend down. Plan: continue to follow cultures. Continue merrem and vanc for now.  Ischemic cardiac event Acute on chronic  heart failure Trops >27k. New LBBB on EKG. BNP 4100 on admission. 11/21 echo with EF of 20-25% with severe hypokinesis of anteroseptum and apex. Aortic Stenosis.  Will need cath once stable and renal function improves.  Continues to require pressor support Plan Titrate levo for MAP>65 Asprin, plavix, heparin infusion  AKI. Improving. FeNa 1.23.History of RAS. Electrolytes stable.Plan: avoid nephrotoxic agents  Myasthenia Gravis.   Plan: continue mestinon, stress steroids Best practice:  Diet: npo Pain/Anxiety/Delirium protocol (if indicated): fentanyl DVT prophylaxis: heparin infusion Mobility: BR Code Status: Full Family Communication: wife updated at bedside Disposition: ICU pending medical stabilization  Mitzi Hansen, MD [place] PGY-1 Pager number [phone] 01/20/19  6:16 AM  Attending Note:  83 year old male with extensive PMH who presents to PCCM with a NSTEMI and concern for cholecystitis that is now no longer an issue.  Overnight, agitated and fentanyl drip was started.  On exam, he is calm with coarse BS diffusely.  I reviewed CXR myself, ETT is in a good position and pulmonary edema noted.  Discussed with PCCM-NP.  Will begin PS trials.  Continue norepi for BP support.  Fentanyl for sedation.  Heparin for NSTEMI.  No OR visits for now.  Advanced heart failure team following.  Begin albumin and lasix with zaroxolyn.  AM labs ordered.  PCCM will continue to manage.  The patient is critically ill with multiple organ systems failure and requires high complexity decision making for assessment and support, frequent evaluation and titration of therapies, application of advanced monitoring technologies and extensive interpretation of multiple databases.   Critical Care Time devoted to patient care services described in this note is  32  Minutes. This time reflects time of care of this signee Dr Jennet Maduro. This critical care time does not reflect procedure  time, or teaching time or supervisory time of PA/NP/Med student/Med Resident etc but could involve care discussion time.  Rush Farmer, M.D. [facility] Pulmonary/Critical Care Medicine.

## 2019-01-20 NOTE — Progress Notes (Signed)
Chaplain followed-up with Mr. And Mrs. Harshberger. Mrs. Bibbins expressed gratefulness for having chaplain check-in.  Mrs. Winnie also expressed having questions around Mr. Warstler mental orientation.  Chaplain told Mrs. Okonski to freely ask questions to nurse about the state of Mr. Evetts body or mind.  Chaplain touched base with nurse about the questions Mrs. Abila seems to have and nurse went in right away to answer her questions.    Chaplain will continue to follow-up.

## 2019-01-20 NOTE — Progress Notes (Addendum)
Progress Note  Patient Name: Duane Burns Date of Encounter: 01/20/2019  Primary Cardiologist: Kate Sable, MD   Subjective   Intubated. Following some commands, appears more agitated this morning.    Precedex switched to fent for bradycardia overnight. Still requiring NE for BP support.   Sepsis, NGTD blood cultures or urine cx. Recent admission last month for e.coli bacteremia w/ completion of abx. Unclear etiology. Prev pericholecystic fluid & gallbladder distention resolved without intervention. Vanc stopped yesterday, on merrem. Unclear etiology.   Net positive overnight 200cc but Cr continuing to improve. CXR improved after diuresis 11/23. CVP 10 from 3 yesterday.   Inpatient Medications    Scheduled Meds: . aspirin  81 mg Per Tube Daily  . atorvastatin  80 mg Per Tube q1800  . chlorhexidine gluconate (MEDLINE KIT)  15 mL Mouth Rinse BID  . Chlorhexidine Gluconate Cloth  6 each Topical Daily  . cholecalciferol  1,000 Units Oral Daily  . clopidogrel  75 mg Per Tube Daily  . feeding supplement (PRO-STAT SUGAR FREE 64)  30 mL Per Tube BID  . hydrocortisone sod succinate (SOLU-CORTEF) inj  50 mg Intravenous Q6H  . mouth rinse  15 mL Mouth Rinse 10 times per day  . multivitamin with minerals  1 tablet Per Tube Daily  . pyridostigmine  60 mg Per Tube TID  . senna-docusate  2 tablet Per Tube BID  . sodium chloride flush  3 mL Intravenous Q12H   Continuous Infusions: . sodium chloride 10 mL/hr at 01/20/19 0500  . famotidine (PEPCID) IV Stopped (01/19/19 2217)  . feeding supplement (VITAL AF 1.2 CAL) 1,000 mL (01/19/19 1521)  . fentaNYL infusion INTRAVENOUS 100 mcg/hr (01/20/19 0500)  . heparin    . meropenem (MERREM) IV 1 g (01/20/19 0525)  . norepinephrine (LEVOPHED) Adult infusion 4 mcg/min (01/20/19 0500)   PRN Meds: sodium chloride, acetaminophen **OR** acetaminophen, bisacodyl, fentaNYL (SUBLIMAZE) injection, guaiFENesin, levalbuterol, midazolam,  ondansetron **OR** ondansetron (ZOFRAN) IV, polyethylene glycol, sennosides, sodium chloride flush, traZODone   Vital Signs    Vitals:   01/20/19 0300 01/20/19 0312 01/20/19 0400 01/20/19 0416  BP: 122/65   119/85  Pulse: (!) 56   61  Resp: (!) 21   (!) 22  Temp:   98.5 F (36.9 C)   TempSrc:   Axillary   SpO2: 100% 100%  100%  Weight:      Height:        Intake/Output Summary (Last 24 hours) at 01/20/2019 0610 Last data filed at 01/20/2019 0500 Gross per 24 hour  Intake 1552.08 ml  Output 1790 ml  Net -237.92 ml   Filed Weights   01/16/19 0400 01/18/19 1000 01/19/19 0620  Weight: 83.6 kg 84.7 kg 79.6 kg    Telemetry    Irregular with p-waves, in 90s - Personally Reviewed  ECG    None today - Personally Reviewed  Physical Exam   General:  acutely ill appearing, opens eyes to voice, following some commands HEENT: intubated Neck: supple.  +JVD to SCM. Carotids 2+ bilat; no bruits.  Cor: PMI nondisplaced. III/VI systolic murmur, +S1 and S2, irregular rate & rhythm.  Lungs: clear Abdomen: soft, NTTP, nondistended. No hepatosplenomegaly. No bruits or masses. Good bowel sounds. Extremities:  no LE edema Neuro: intubated and sedated, opens eyes to voice and physical stimuli, not following commands   Labs    Chemistry Recent Labs  Lab 01/16/19 1024  01/17/19 0229  01/18/19 0400  01/19/19 0345 01/20/19 0318 01/20/19 0408  NA 142   < > 139   < > 140   < > 144 142 143  K 4.8   < > 5.0   < > 4.2   < > 3.4* 4.1 4.0  CL 110  --  110   < > 110  --  109  --  110  CO2 18*  --  18*   < > 18*  --  22  --  22  GLUCOSE 136*  --  132*   < > 138*  --  84  --  160*  BUN 32*  --  44*   < > 59*  --  57*  --  64*  CREATININE 2.44*  --  2.98*   < > 3.40*  --  2.87*  --  2.23*  CALCIUM 7.6*  --  7.5*   < > 7.3*  --  7.9*  --  7.8*  PROT 5.6*  --  5.5*  --   --   --  5.1*  --   --   ALBUMIN 2.9*  --  2.6*  --   --   --  2.4*  --   --   AST 544*  --  410*  --   --   --  78*  --    --   ALT 183*  --  159*  --   --   --  88*  --   --   ALKPHOS 65  --  72  --   --   --  82  --   --   BILITOT 0.8  --  1.0  --   --   --  2.5*  --   --   GFRNONAA 24*  --  19*   < > 16*  --  20*  --  27*  GFRAA 28*  --  22*   < > 19*  --  23*  --  31*  ANIONGAP 14  --  11   < > 12  --  13  --  11   < > = values in this interval not displayed.     Hematology Recent Labs  Lab 01/18/19 0400  01/19/19 0345 01/20/19 0318 01/20/19 0408  WBC 42.7*  --  24.4*  --  23.2*  RBC 3.84*  --  3.75*  --  3.54*  HGB 11.6*   < > 11.1* 15.0 10.4*  HCT 35.6*   < > 33.5* 44.0 32.1*  MCV 92.7  --  89.3  --  90.7  MCH 30.2  --  29.6  --  29.4  MCHC 32.6  --  33.1  --  32.4  RDW 14.4  --  14.1  --  14.1  PLT 185  --  121*  --  105*   < > = values in this interval not displayed.    Cardiac EnzymesNo results for input(s): TROPONINI in the last 168 hours. No results for input(s): TROPIPOC in the last 168 hours.   BNP Recent Labs  Lab 01/16/19 1024  BNP 4,082.7*     DDimer No results for input(s): DDIMER in the last 168 hours.   Radiology    Ct Abd Limited W/o Cm  Result Date: 01/18/2019 CLINICAL DATA:  Sepsis, elevated WBC, gallbladder distension previous CT. Cholecystostomy drain catheter placement requested EXAM: CT ABDOMEN WITHOUT CONTRAST LIMITED TECHNIQUE: Multidetector CT imaging of the abdomen was performed following the standard protocol without IV contrast. COMPARISON:  01/24/2019 FINDINGS: Lower chest: New small bilateral pleural effusions. New consolidation/atelectasis posteriorly in both lower lobes. Extensive coronary calcifications. Hepatobiliary: The gallbladder is incompletely distended, significantly decompressed from the prior study. No significant pericholecystic inflammatory/edematous changes. No radiodense gallstones. No focal liver lesion evident. No biliary ductal dilatation. Pancreas: Visualized portions unremarkable Spleen: Small, without focal lesion. Adrenals/Urinary  Tract: Normal adrenals. Kidneys are incompletely visualized. Stomach/Bowel: Stomach is decompressed by a nasogastric tube. Visualized portions of small bowel and colon unremarkable. Vascular/Lymphatic: Atheromatous aorta. No adenopathy identified. Other: Trace perihepatic and perisplenic ascites. No evident free air. Musculoskeletal: Usual degenerative changes in the visualized lower lumbar spine. IMPRESSION: 1. Interval resolution of gallbladder distension, without worrisome findings. After telephone consultation with Dr. Kae Heller from general surgery, percutaneous cholecystostomy catheter placement was deferred. 2. New bilateral pleural effusions and bibasilar consolidation/atelectasis. 3. Coronary and Aortic Atherosclerosis (ICD10-170.0). Electronically Signed   By: Lucrezia Europe M.D.   On: 01/18/2019 16:58   Dg Chest Port 1 View  Result Date: 01/19/2019 CLINICAL DATA:  Endotracheal tube present. Respiratory failure. EXAM: PORTABLE CHEST 1 VIEW COMPARISON:  One-view chest x-ray 01/18/2019 FINDINGS: The heart is enlarged. Endotracheal tube is stable, 3.8 cm above the carina. NG tube courses off the inferior border the film. Right IJ line is stable. Aortic atherosclerosis is again seen. There is improved aeration in both lungs. Moderate pulmonary vascular congestion is improved. Mild airspace disease remains at the bases. IMPRESSION: 1. Stable cardiomegaly. 2. Improving pulmonary vascular congestion. 3. Mild bibasilar airspace disease likely reflects atelectasis. Infection is not excluded. 4. Support apparatus is stable. Electronically Signed   By: San Morelle M.D.   On: 01/19/2019 08:49   Dg Chest Port 1 View  Result Date: 01/18/2019 CLINICAL DATA:  Insertion of right central line. EXAM: PORTABLE CHEST 1 VIEW 3:23 p.m. COMPARISON:  01/18/2019 at 5:25 a.m. FINDINGS: New right central venous catheter tip is at the cavoatrial junction 5 cm below the carina in good position. No pneumothorax. Endotracheal  tube is in good position 3.6 cm above the carina. NG tube tip is in the distal stomach. The bilateral pulmonary infiltrates have improved since the prior study, likely representing pulmonary edema. Effusions. Aortic atherosclerosis. No acute bone abnormality. IMPRESSION: 1. New right central venous catheter tip is at the cavoatrial junction. No pneumothorax. 2. Improving bilateral pulmonary infiltrates, likely pulmonary edema. 3. Aortic Atherosclerosis (ICD10-I70.0). Electronically Signed   By: Lorriane Shire M.D.   On: 01/18/2019 15:33    Cardiac Studies   Echo 11/21  1. Left ventricular ejection fraction, by visual estimation, is 20 to 25%. The left ventricle has severely decreased function. There is no left ventricular hypertrophy.  2. Diffuse hypokinesis with most prominent severe hypokinesis of the anteroseptal wall and the entire apex.  3. Global right ventricle was not assessed.The right ventricular size is not assessed. Right vetricular wall thickness was not assessed.  4. Left atrial size was not assessed.  5. Right atrial size was not assessed.  6. The pericardium was not assessed.  7. The mitral valve was not assessed. not assessed mitral valve regurgitation.  8. The tricuspid valve is not assessed. Tricuspid valve regurgitation not assessed.  9. Aortic valve regurgitation Not assessed. 10. The aortic valve was not assessed. Aortic valve regurgitation Not assessed. 11. The pulmonic valve was not assessed. Pulmonic valve regurgitation Not assessed. 12. Not assessed. 13. The interatrial septum was not assessed.  ECHO 11/20 12/2018 LEFT VENTRICLE PLAX 2D LVIDd: 4.17 cm LVIDs: 2.50 cm LV PW: 1.26  cm LV IVS: 1.30 cm LVOT diam: 1.80 cm LV SV: 55 ml LV SV Index: 26.59 LVOT Area: 2.54 cm  06/2018 +------------------+------------++ AORTIC VALVE   +------------------+------------++ AV Area (Vmax): 0.88 cm   +------------------+------------++ AV Area (Vmean): 0.89 cm  +------------------+------------++ AV Area (VTI): 0.90 cm  +------------------+------------++ AV Vmax: 372.81 cm/s  +------------------+------------++ AV Vmean: 255.916 cm/s +------------------+------------++ AV VTI: 0.923 m  +------------------+------------++ AV Peak Grad: 55.6 mmHg  +------------------+------------++ AV Mean Grad: 30.9 mmHg  +------------------+------------++ LVOT Vmax: 86.15 cm/s  +------------------+------------++ LVOT Vmean: 60.100 cm/s  +------------------+------------++ LVOT VTI: 0.220 m  +------------------+------------++ LVOT/AV VTI ratio:0.24  +------------------+------------++ AR PHT: 779 msec  +------------------+------------++    Patient Profile     Duane Burns is a 83yo male with PMH HTN, moderate to severe AS, myasthenia gravis, HLD, recent e. Coli bacteremia treated at [facility] (10/19-10/29) presenting with sepsis secondary to acute cholecystitis with acute HF and severe aortic stenosis.   Assessment & Plan    Acute decompensated Systolic HF   Acute Respiratory Failure  BNP 4100 11/20. Acute HF 2/2 worsening aortic stenosis in the setting of septic shock. EF 20-25% with severe AS from 60-65% with mod-severe AS on 06/2018. Intubated 11/22 for worsening respiratory failure and worsening pulm edema on cxr.   - Cr continuing to improve with lasix stopped yesterday although net positive overnight. - no weight this am, weight pending.   - CVP 10 from 3 yesterday.   - co-ox 62%, still requiring levo 2 mcg - continuing levo with map goal >65 - R&L cath once HD stable  - intubated with vent management per PCCM, failed SBT past two days. SBT again today. ?MG crisis with concurrent infection, consider IVIG.   New Left Bundle Branch  Block NSTEMI Trops >27K, new LBBB on EKG, not a candidate for urgent cath due to sepsis requiring pressors. Heparin, asa, plavix with loading dose started. Afib now resolved. - R&L cath once improved renal function and HDS with possible PCI.  - no BB or other BP medications due to hypotension requiring levophed, no ACE/ARB with AKI.  - cont. Asa, plavix  - cont. IV heparin   Septic Shock  Acute Cholecystitis - resolved on CT  - recent e. Coli bacteremia one month ago with repeat blood cx negative prior to discharge  - exact etiology unclear, NGTD blood & urine cx. - Very elevated white count likely secondary to chronic prednisone for MG, may now be closer to baseline - abx per primary - vanc stopped yesterday, continuing merrem    Aortic Stenosis Worsened AS since May of this year. Will need R&L heart cath after he is stable and has improvement in renal function and evaluation for TAVR.   AKI 2/2 septic shock, ATN and volume overload. Baseline Cr 1. 1.65 on admission 11/20, trended up to 3.4. History of RAS.  - improved with IV lasix and continuing to improve today off lasix with likely ATN as well.  - daily cmp  - CVP 10.   MG Concern for MG crisis with concurrent infection and difficulty weaning s/p diuresing but still having difficulty weaning.  - prednisone switched to stress dose steroids  - consider IVIG if continued SBT failure.   PAF - had AF overnight on 11/23 - now in NSR  - Will continue heparin for now. If AF does not recur would not proceed with long-term AC as risk of triple therapy quite high for him   For questions or updates, please contact [place] Please  consult [email or web address] for contact info under Cardiology/STEMI.      Signed, Marty Heck, DO  01/20/2019, 6:10 AM    Agree with above.   Remains intubated. Awake on vent. Will follow commands. Remains on NE at 2. BP and co-ox improved. CVP 10. Failed SBT due to poor MV (volume ok, low rate).  Now on stress dose steroids and meropenem.   General:  Elderly male. Awake on vent No resp difficulty HEENT: normal + ETT Neck: supple. LIJ TLC . Carotids 2+ bilat; no bruits. No lymphadenopathy or thryomegaly appreciated. Cor: PMI nondisplaced. Regular rate & rhythm. No rubs, gallops or murmurs. Lungs: clear Abdomen: soft, nontender, nondistended. No hepatosplenomegaly. No bruits or masses. Good bowel sounds. Extremities: no cyanosis, clubbing, rash, edema Neuro: awake on vent follows commands  Shock resolving but still on low-dose NE. Can wean as tolerated. Remains intubated. JVP up. Will give lasix 80 IV this am. Can re-dose in pm. Goal 2-3L negative today to try to facilitate extubation. Renal function improving. Keep SBP > 100 to maximize renal perfusion.   Remains on DAPT. Consider R/L cath if/when he has further renal recovery. Continue heparin for now with previous PAF but may be able to stop in next day or two if no further AF.   Etiology of recurrent infections remains unclear.   Discussed possibility of MG crisis with CCM but appears to have strong respiratory effort on vent so agree that this is unlikely. Continue to follow closely.   CRITICAL CARE Performed by: Glori Bickers  Total critical care time: 35 minutes  Critical care time was exclusive of separately billable procedures and treating other patients.  Critical care was necessary to treat or prevent imminent or life-threatening deterioration.  Critical care was time spent personally by me (independent of midlevel providers or residents) on the following activities: development of treatment plan with patient and/or surrogate as well as nursing, discussions with consultants, evaluation of patient's response to treatment, examination of patient, obtaining history from patient or surrogate, ordering and performing treatments and interventions, ordering and review of laboratory studies, ordering and review of  radiographic studies, pulse oximetry and re-evaluation of patient's condition.  Glori Bickers, MD  8:54 AM

## 2019-01-20 NOTE — Progress Notes (Signed)
ANTICOAGULATION CONSULT NOTE Pharmacy Consult for:  Heparin Indication: ACS/STEMI  No Known Allergies  Patient Measurements: Height: 5\' 8"  (172.7 cm) Weight: 175 lb 7.8 oz (79.6 kg) IBW/kg (Calculated) : 68.4 HEPARIN DW (KG): 86 kg  Vital Signs: Temp: 98.5 F (36.9 C) (11/25 0400) Temp Source: Axillary (11/25 0400) BP: 119/85 (11/25 0416) Pulse Rate: 61 (11/25 0416)  Labs: Recent Labs    01/17/19 1442  01/18/19 0400  01/19/19 0345 01/19/19 1134 01/19/19 2043 01/20/19 0318 01/20/19 0408  HGB  --    < > 11.6*   < > 11.1*  --   --  15.0 10.4*  HCT  --    < > 35.6*   < > 33.5*  --   --  44.0 32.1*  PLT  --   --  185  --  121*  --   --   --  105*  LABPROT 16.9*  --   --   --   --   --   --   --   --   INR 1.4*  --   --   --   --   --   --   --   --   HEPARINUNFRC  --   --  <0.10*   < >  --  <0.10* 0.82*  --  1.03*  CREATININE  --    < > 3.40*  --  2.87*  --   --   --  2.23*   < > = values in this interval not displayed.    Estimated Creatinine Clearance: 25.1 mL/min (A) (by C-G formula based on SCr of 2.23 mg/dL (H)).   Assessment: 83 yo male with NSTEMI for heparin.   Goal of Therapy:  Heparin level 0.3-0.7 units/ml Monitor platelets by anticoagulation protocol: Yes   Plan:  Hold heparin x 45 min, then decrease heparin 1100 units/hr Check heparin level in 8 hours.   Phillis Knack, PharmD, BCPS  01/20/2019 5:14 AM

## 2019-01-20 NOTE — Progress Notes (Signed)
ANTICOAGULATION CONSULT NOTE Pharmacy Consult for:  Heparin Indication: ACS/STEMI  No Known Allergies  Patient Measurements: Height: 5\' 8"  (172.7 cm) Weight: 178 lb 9.2 oz (81 kg) IBW/kg (Calculated) : 68.4 HEPARIN DW (KG): 79.6 kg  Vital Signs: Temp: 98.2 F (36.8 C) (11/25 1109) Temp Source: Oral (11/25 1109) BP: 139/84 (11/25 1400) Pulse Rate: 137 (11/25 1400)  Labs: Recent Labs    01/18/19 0400  01/19/19 0345  01/19/19 2043 01/20/19 0318 01/20/19 0408 01/20/19 1337  HGB 11.6*   < > 11.1*  --   --  15.0 10.4*  --   HCT 35.6*   < > 33.5*  --   --  44.0 32.1*  --   PLT 185  --  121*  --   --   --  105*  --   HEPARINUNFRC <0.10*   < >  --    < > 0.82*  --  1.03* 1.14*  CREATININE 3.40*  --  2.87*  --   --   --  2.23*  --    < > = values in this interval not displayed.    Estimated Creatinine Clearance: 25.1 mL/min (A) (by C-G formula based on SCr of 2.23 mg/dL (H)).   Assessment: 83 yo male with NSTEMI and elevated troponin. Pharmacy asked to dose heparin. No anticoagulation prior to admission.  Heparin level (1.14) remains supratherapeutic after holding heparin for 45 minutes and decreasing drip rate to 1100 units/hr. CBC trending down with Hgb 10.4 and plts 105 today. No overt bleeding or infusion issues per RN. Confirmed heparin running through peripheral and level drawn from central line with RN.  Goal of Therapy:  Heparin level 0.3-0.7 units/ml Monitor platelets by anticoagulation protocol: Yes   Plan:  Hold heparin for 1 hr Restart heparin IV at 900 units/hr at 1630 Check 8-hr heparin level Daily HL and CBC Monitor for bleeding  Richardine Service, PharmD PGY1 Pharmacy Resident Phone: [phone] 01/20/2019  3:40 PM  Please check [email or web address] for unit-specific pharmacy phone numbers.

## 2019-01-20 NOTE — Progress Notes (Signed)
RT note- Patient was agitated, moderate amount secretions, placed back to full support.

## 2019-01-21 ENCOUNTER — Inpatient Hospital Stay (HOSPITAL_COMMUNITY): Payer: Medicare HMO

## 2019-01-21 DIAGNOSIS — G7000 Myasthenia gravis without (acute) exacerbation: Secondary | ICD-10-CM

## 2019-01-21 DIAGNOSIS — J810 Acute pulmonary edema: Secondary | ICD-10-CM

## 2019-01-21 DIAGNOSIS — N179 Acute kidney failure, unspecified: Secondary | ICD-10-CM

## 2019-01-21 DIAGNOSIS — Z9911 Dependence on respirator [ventilator] status: Secondary | ICD-10-CM

## 2019-01-21 LAB — BASIC METABOLIC PANEL
Anion gap: 13 (ref 5–15)
Anion gap: 16 — ABNORMAL HIGH (ref 5–15)
BUN: 77 mg/dL — ABNORMAL HIGH (ref 8–23)
BUN: 79 mg/dL — ABNORMAL HIGH (ref 8–23)
CO2: 26 mmol/L (ref 22–32)
CO2: 23 mmol/L (ref 22–32)
Calcium: 8 mg/dL — ABNORMAL LOW (ref 8.9–10.3)
Calcium: 8.2 mg/dL — ABNORMAL LOW (ref 8.9–10.3)
Chloride: 107 mmol/L (ref 98–111)
Chloride: 109 mmol/L (ref 98–111)
Creatinine, Ser: 2.11 mg/dL — ABNORMAL HIGH (ref 0.61–1.24)
Creatinine, Ser: 2.1 mg/dL — ABNORMAL HIGH (ref 0.61–1.24)
GFR calc Af Amer: 33 mL/min — ABNORMAL LOW (ref >60)
GFR calc Af Amer: 33 mL/min — ABNORMAL LOW (ref >60)
GFR calc non Af Amer: 28 mL/min — ABNORMAL LOW (ref >60)
GFR calc non Af Amer: 29 mL/min — ABNORMAL LOW (ref >60)
Glucose, Bld: 161 mg/dL — ABNORMAL HIGH (ref 70–99)
Glucose, Bld: 166 mg/dL — ABNORMAL HIGH (ref 70–99)
Potassium: 3 mmol/L — ABNORMAL LOW (ref 3.5–5.1)
Potassium: 3.4 mmol/L — ABNORMAL LOW (ref 3.5–5.1)
Sodium: 146 mmol/L — ABNORMAL HIGH (ref 135–145)
Sodium: 148 mmol/L — ABNORMAL HIGH (ref 135–145)

## 2019-01-21 LAB — HEPARIN LEVEL (UNFRACTIONATED)
Heparin Unfractionated: 1 IU/mL — ABNORMAL HIGH (ref 0.30–0.70)
Heparin Unfractionated: 0.94 IU/mL — ABNORMAL HIGH (ref 0.30–0.70)
Heparin Unfractionated: 0.47 IU/mL (ref 0.30–0.70)

## 2019-01-21 LAB — POCT I-STAT 7, (LYTES, BLD GAS, ICA,H+H)
Acid-Base Excess: 2 mmol/L (ref 0.0–2.0)
Bicarbonate: 24.7 mmol/L (ref 20.0–28.0)
Calcium, Ion: 1.11 mmol/L — ABNORMAL LOW (ref 1.15–1.40)
HCT: 31 % — ABNORMAL LOW (ref 39.0–52.0)
Hemoglobin: 10.5 g/dL — ABNORMAL LOW (ref 13.0–17.0)
O2 Saturation: 99 %
Patient temperature: 98.2
Potassium: 2.7 mmol/L — CL (ref 3.5–5.1)
Sodium: 147 mmol/L — ABNORMAL HIGH (ref 135–145)
TCO2: 26 mmol/L (ref 22–32)
pCO2 arterial: 31.3 mmHg — ABNORMAL LOW (ref 32.0–48.0)
pH, Arterial: 7.505 — ABNORMAL HIGH (ref 7.350–7.450)
pO2, Arterial: 108 mmHg (ref 83.0–108.0)

## 2019-01-21 LAB — PHOSPHORUS: Phosphorus: 4.1 mg/dL (ref 2.5–4.6)

## 2019-01-21 LAB — COOXEMETRY PANEL
Carboxyhemoglobin: 0.4 % — ABNORMAL LOW (ref 0.5–1.5)
Methemoglobin: 0.7 % (ref 0.0–1.5)
O2 Saturation: 54.8 %
Total hemoglobin: 15.7 g/dL (ref 12.0–16.0)

## 2019-01-21 LAB — GLUCOSE, CAPILLARY
Glucose-Capillary: 158 mg/dL — ABNORMAL HIGH (ref 70–99)
Glucose-Capillary: 148 mg/dL — ABNORMAL HIGH (ref 70–99)
Glucose-Capillary: 164 mg/dL — ABNORMAL HIGH (ref 70–99)
Glucose-Capillary: 135 mg/dL — ABNORMAL HIGH (ref 70–99)
Glucose-Capillary: 113 mg/dL — ABNORMAL HIGH (ref 70–99)

## 2019-01-21 LAB — CBC
HCT: 33.5 % — ABNORMAL LOW (ref 39.0–52.0)
Hemoglobin: 10.8 g/dL — ABNORMAL LOW (ref 13.0–17.0)
MCH: 29.3 pg (ref 26.0–34.0)
MCHC: 32.2 g/dL (ref 30.0–36.0)
MCV: 90.8 fL (ref 80.0–100.0)
Platelets: 148 10*3/uL — ABNORMAL LOW (ref 150–400)
RBC: 3.69 MIL/uL — ABNORMAL LOW (ref 4.22–5.81)
RDW: 14.1 % (ref 11.5–15.5)
WBC: 29.9 10*3/uL — ABNORMAL HIGH (ref 4.0–10.5)
nRBC: 0 % (ref 0.0–0.2)

## 2019-01-21 LAB — MAGNESIUM: Magnesium: 2.1 mg/dL (ref 1.7–2.4)

## 2019-01-21 MED ORDER — FAMOTIDINE 40 MG/5ML PO SUSR
20.0000 mg | Freq: Every day | ORAL | Status: DC
  Filled 2019-01-21: qty 2.5

## 2019-01-21 MED ORDER — SODIUM CHLORIDE 0.9 % IV SOLN
1.5000 g | Freq: Two times a day (BID) | INTRAVENOUS | Status: DC
  Administered 2019-01-21 – 2019-01-22 (×2): 1.5 g via INTRAVENOUS
  Filled 2019-01-21 (×3): qty 4

## 2019-01-21 MED ORDER — HEPARIN (PORCINE) 25000 UT/250ML-% IV SOLN
500.0000 [IU]/h | INTRAVENOUS | Status: DC
  Administered 2019-01-21: 500 [IU]/h via INTRAVENOUS
  Filled 2019-01-21: qty 250

## 2019-01-21 MED ORDER — FAMOTIDINE IN NACL 20-0.9 MG/50ML-% IV SOLN
20.0000 mg | INTRAVENOUS | Status: DC
  Administered 2019-01-21 – 2019-01-29 (×9): 20 mg via INTRAVENOUS
  Filled 2019-01-21 (×9): qty 50

## 2019-01-21 MED ORDER — POTASSIUM CHLORIDE 10 MEQ/50ML IV SOLN
10.0000 meq | INTRAVENOUS | Status: AC
  Administered 2019-01-21 (×3): 10 meq via INTRAVENOUS
  Filled 2019-01-21 (×3): qty 50

## 2019-01-21 MED ORDER — POTASSIUM CHLORIDE 10 MEQ/50ML IV SOLN
10.0000 meq | INTRAVENOUS | Status: AC
  Administered 2019-01-21 (×5): 10 meq via INTRAVENOUS
  Filled 2019-01-21 (×5): qty 50

## 2019-01-21 MED ORDER — POTASSIUM CHLORIDE 20 MEQ/15ML (10%) PO SOLN
20.0000 meq | Freq: Every day | ORAL | Status: DC
  Administered 2019-01-21: 20 meq
  Filled 2019-01-21: qty 15

## 2019-01-21 MED ORDER — DEXMEDETOMIDINE HCL IN NACL 400 MCG/100ML IV SOLN
0.4000 ug/kg/h | INTRAVENOUS | Status: DC
  Administered 2019-01-21: 0.4 ug/kg/h via INTRAVENOUS
  Filled 2019-01-21: qty 100

## 2019-01-21 MED ORDER — HALOPERIDOL LACTATE 5 MG/ML IJ SOLN
2.0000 mg | Freq: Four times a day (QID) | INTRAMUSCULAR | Status: DC | PRN
  Administered 2019-01-22: 2 mg via INTRAVENOUS
  Filled 2019-01-21: qty 1

## 2019-01-21 MED ORDER — SPIRONOLACTONE 12.5 MG HALF TABLET
12.5000 mg | ORAL_TABLET | Freq: Every day | ORAL | Status: DC
  Administered 2019-01-21: 12.5 mg
  Filled 2019-01-21: qty 1

## 2019-01-21 MED ORDER — HALOPERIDOL LACTATE 5 MG/ML IJ SOLN
2.0000 mg | Freq: Once | INTRAMUSCULAR | Status: AC
  Administered 2019-01-21: 2 mg via INTRAVENOUS
  Filled 2019-01-21: qty 1

## 2019-01-21 MED ORDER — HYDROCORTISONE NA SUCCINATE PF 100 MG IJ SOLR
50.0000 mg | Freq: Every day | INTRAMUSCULAR | Status: DC
  Administered 2019-01-22: 50 mg via INTRAVENOUS
  Filled 2019-01-21: qty 2

## 2019-01-21 MED ORDER — DIPHENHYDRAMINE HCL 50 MG/ML IJ SOLN
25.0000 mg | Freq: Once | INTRAMUSCULAR | Status: AC
  Administered 2019-01-21: 25 mg via INTRAVENOUS
  Filled 2019-01-21: qty 1

## 2019-01-21 MED ORDER — POTASSIUM CHLORIDE 10 MEQ/50ML IV SOLN
INTRAVENOUS | Status: AC
  Administered 2019-01-21: 10 meq via INTRAVENOUS
  Filled 2019-01-21: qty 50

## 2019-01-21 MED ORDER — POTASSIUM CHLORIDE 10 MEQ/50ML IV SOLN
10.0000 meq | INTRAVENOUS | Status: AC
  Administered 2019-01-21 (×3): 10 meq via INTRAVENOUS

## 2019-01-21 NOTE — Progress Notes (Signed)
Chaplain engaged in follow-up visit with Mr. Postiglione wife.  During visit, Mrs. Kniffin noted that she has been having spells in which she passes out at night do to her diabetes.  She told chaplain that last night she hit her face, showing chaplain the bruises, and then woke up later just fine.  Chaplain urged Mrs. Hickel to take care of her own health so that she can be an active caregiver for her husband.  Mrs. Cullom seems to be neglecting her own health as she comes to the hospital daily.    Chaplain continues to offer presence and support.  Chaplain will continue to follow-up.

## 2019-01-21 NOTE — Progress Notes (Signed)
ANTICOAGULATION CONSULT NOTE Pharmacy Consult for:  Heparin Indication: ACS/STEMI  No Known Allergies  Patient Measurements: Height: 5\' 8"  (172.7 cm) Weight: 175 lb 7.8 oz (79.6 kg) IBW/kg (Calculated) : 68.4 HEPARIN DW (KG): 79.6 kg  Vital Signs: Temp: 98.1 F (36.7 C) (11/26 1152) Temp Source: Oral (11/26 1152) BP: 129/109 (11/26 0500) Pulse Rate: 72 (11/26 0700)  Labs: Recent Labs    01/19/19 0345  01/20/19 0408 01/20/19 1337 01/21/19 0121 01/21/19 0400 01/21/19 0509 01/21/19 1059  HGB 11.1*   < > 10.4*  --   --  10.8* 10.5*  --   HCT 33.5*   < > 32.1*  --   --  33.5* 31.0*  --   PLT 121*  --  105*  --   --  148*  --   --   HEPARINUNFRC  --    < > 1.03* 1.14* 1.00*  --   --  0.94*  CREATININE 2.87*  --  2.23*  --   --  2.11*  --   --    < > = values in this interval not displayed.    Estimated Creatinine Clearance: 26.6 mL/min (A) (by C-G formula based on SCr of 2.11 mg/dL (H)).   Assessment: 83 yo male with NSTEMI and elevated troponin. Pharmacy asked to dose heparin. No anticoagulation prior to admission.  Heparin level remains elevated x3 but trending down. Will hold infusion x30 min and restart at lower rate.  Goal of Therapy:  Heparin level 0.3-0.7 units/ml Monitor platelets by anticoagulation protocol: Yes   Plan:  Hold heparin for 18min then restart 500 units/hr Recheck heparin level this evening   Arrie Senate, PharmD, BCPS Clinical Pharmacist [phone] Please check AMION for all [place] numbers 01/21/2019

## 2019-01-21 NOTE — Progress Notes (Signed)
PCCM INTERVAL PROGRESS NOTE  Called to bedside by RN for increasing patient agitation despite precedex infusion. Patient scratching himself and pulling at lines and tube. He is unable to take his PO medications. He is unable to be redirected and will not follow commands.   Plan: Continue precedex Add Haldol PRN, d/w Dr. Chriss Driver, AGACNP-BC [place]  See Amion for personal pager PCCM on call pager [phone]  01/21/2019 9:36 PM

## 2019-01-21 NOTE — Progress Notes (Signed)
NAME:  RAISHAWN NAZAIRE, MRN:  [id number], DOB:  September 28, 1935, LOS: 6 ADMISSION DATE:  12/30/2018,  CHIEF COMPLAINT:  Chest pain  Brief History   83 yo male who initally presented to [facility] with findings consistent with sepsis. Recent e. Coli bacteremia treated at OSH. While at [facility], patient developed chest pain with multiple PACs and PVCs and was subsequently transferred to [facility] for further evaluation and treatment. On arrival, trops>27k. Lactate 3.7. white count 37k. No ST elevation on initial EKG however did develop new LBBB on later EKG. Cardiology consulted and spoke with intervential cardiology. Given septic state, renal failure, was not candidate for cath lab.  Imaging suggestive of biliary source of sepsis with cholecytitis present. Not a candidate for lap chole at that time so IR consulted with plans for perc cholecystotomy tube place 11/23 however on repeat imaging, gallbladder distension was resolved so tube placement was deferred.  Past Medical History  Myasthenia Gravis HTN Prostate cancer HLD RAS   Significant Hospital Events   11/20 admitted to [facility] with sepsis 11/21 transferred to [facility] for chest pain and EKG changes  Consults:  Cardiology gen surg IR  Procedures:  11/20 CVC 11/22 Intubation 11/23 central IJ line placement  Significant Diagnostic Tests:  11/20 echo: EF 30-35%. Severely decreased LV function. Mild LVH. 11/21 echo: EF 20-25% with severely decreased LV function. No LVH. Diffuse hypokinesis most prominently of the anteroseptal wall and entire apex  11/20 CT c/a/p:pericholecystic edema. Left renal stone  11/21 abd [place]: thickened gallbladder wall with gallstones/sludge. Suspicious for cholecystitis. 11/23 CT a/p: interval resolution of gallbladder distension. New bilateral pleural effusions and bibasilar consolidation  11/21 CXR: worsened CHF pattern 11/22 CXR: progressively worsened pulm edema. Slight increase in  cardiomegaly 11/23 CXR s/p central line placement: improving bilateral pulm infiltrates.  11/24 CXR: pulm vasc congestion improving. Mild bibasilar airspace disease atelectasis vs infection 11/26 chest x-ray: Minimal bilateral vascular congestion.  Micro Data:  11/20 BC>>NGTD  Antimicrobials:  merrem 11/21>> vanc 11/21>> Interim history/subjective:   Doing well this morning tolerating SBT.  Diuresed well.  CVP 3.  Discussed with cardiology.  Had good urine output last night.  Objective   Blood pressure (!) 129/109, pulse 72, temperature 98.6 F (37 C), temperature source Oral, resp. rate 18, height 5\' 8"  (1.727 m), weight 79.6 kg, SpO2 97 %. CVP:  [8 mmHg] 8 mmHg  Vent Mode: PSV;CPAP FiO2 (%):  [40 %] 40 % Set Rate:  [22 bmp] 22 bmp Vt Set:  [540 mL] 540 mL PEEP:  [5 cmH20] 5 cmH20 Pressure Support:  [5 cmH20-10 cmH20] 5 cmH20 Plateau Pressure:  [19 cmH20-21 cmH20] 21 cmH20   Intake/Output Summary (Last 24 hours) at 01/21/2019 0925 Last data filed at 01/21/2019 0800 Gross per 24 hour  Intake 1133.16 ml  Output 4460 ml  Net -3326.84 ml   Filed Weights   01/19/19 0620 01/20/19 0500 01/21/19 0500  Weight: 79.6 kg 81 kg 79.6 kg    Examination: General: Intubated on mechanical life support HEENT: Endotracheal tube in place Lungs: Bilateral ventilated breath sounds Cardiovascular: Regular rate rhythm, S1-S2 Abdomen: Nondistended, BS present Extremities: Warm, no significant edema Neuro: Alert oriented following commands moves all 4 extremities spontaneously GU: Foley in place  Resolved Hospital Problem list   none  Assessment & Plan:   Acute respiratory failure acquiring intubation and mechanical ventilation Acute pulmonary edema  likely multifactorial in setting of pulm edema with possible pneumonia. Plan:  Remains  in the intensive care unit on mechanical life support Tolerating SBT/SAT Does not appear neuromuscularly weak.  He is able to draw good tidal volume  and minute ventilation and pressure support breath. Ventilator associated pneumonia prophylaxis Stop continuous sedation Plans for extubation trial.  Septic shock, leukocytosis, unclear etiology, history of gram-negative bacteremia at previous hospitalization Blood cultures from 11/20 NGTD. White count continuing to trend down. He was taken to IR for potential percutaneous cholecystostomy however gallbladder was decompressed and decision was not made for drain. Unclear source however did have significantly elevated white count Does have a history of gram-negative bacteremia back in October at prior hospitalization. Plan: De-escalate antimicrobials to Unasyn Try to avoid antimicrobials that precipitate potential MG crisis  Ischemic cardiac event, newly depressed ejection fraction 20% Acute on chronic heart failure Acute on chronic systolic heart failure, volume overload, resolving Cute pulmonary edema Trops >27k. New LBBB on EKG. BNP 4100 on admission. 11/21 echo with EF of 20-25% with severe hypokinesis of anteroseptum and apex. Severe Aortic Stenosis.   Will need cath once stable and renal function improves.  Continues to require pressor support Plan Continue to wean Levophed for mean arterial pressure greater than 65 Continue aspirin, Plavix, plus heparin infusion as per cardiology Discussed with cardiology at bedside this morning.  Diuresed well  AKI. Improving. FeNa 1.23.History of RAS. Electrolytes stable. Plan: Avoid nephrotoxic agents, follow urine output.  Myasthenia Gravis.   Plan:  Wean stress dose steroids to 50 mg hydrocortisone once daily Continue Mestinon.  Hypokalemia Plan: Replete  Best practice:  Diet: N.p.o. plans for extubation Pain/Anxiety/Delirium protocol (if indicated): fentanyl DVT prophylaxis: heparin infusion Mobility: BR Code Status: Full Family Communication: We will call Disposition: ICU  This patient is critically ill with multiple organ  system failure; which, requires frequent high complexity decision making, assessment, support, evaluation, and titration of therapies. This was completed through the application of advanced monitoring technologies and extensive interpretation of multiple databases. During this encounter critical care time was devoted to patient care services described in this note for 38 minutes.  Garner Nash, DO [facility] 01/21/2019 9:25 AM

## 2019-01-21 NOTE — Evaluation (Signed)
Clinical/Bedside Swallow Evaluation Patient Details  Name: Duane Burns MRN: [id number] Date of Birth: 1936-01-06  Today's Date: 01/21/2019 Time: SLP Start Time (ACUTE ONLY): 1450 SLP Stop Time (ACUTE ONLY): 1504 SLP Time Calculation (min) (ACUTE ONLY): 14 min  Past Medical History:  Past Medical History:  Diagnosis Date  . Arthritis   . Cancer [facility])    History of skin cancer / hx Prostate cancer  . Dysphagia 11/10/2014  . Dysphonia 11/11/2014  . Foot drop, left    DUE TO NERVE INJURY IN BACK PER PT  . HNP (herniated nucleus pulposus with myelopathy), thoracic    HISTORY OF HNP  . Hyperlipidemia   . Hypertension   . Myasthenia gravis ([place]) 11/12/2014   versus Guillain-Barr syndrome.  . Pneumonia    JAN 2016  . Urgency of urination   . Urinary leakage    Past Surgical History:  Past Surgical History:  Procedure Laterality Date  . BACK SURGERY     X 3  . CYSTOSCOPY WITH URETHRAL DILATATION N/A 05/10/2014   Procedure: CYSTOSCOPY WITH URETHRAL DILATATION;  Surgeon: Alexis Frock, MD;  Location: [facility] ORS;  Service: Urology;  Laterality: N/A;  with catheter placement, prior to surgery. procedure performed on the stretcher.  Marland Kitchen PROSTATE SURGERY    . SKIN CANCER EXCISION    . TONSILLECTOMY    . TOTAL HIP ARTHROPLASTY Right 05/10/2014   Procedure: RIGHT TOTAL HIP ARTHROPLASTY ANTERIOR APPROACH ;  Surgeon: Paralee Cancel, MD;  Location: [facility] ORS;  Service: Orthopedics;  Laterality: Right;   HPI:  83 yo male who initally presented to [facility] with findings consistent with sepsis. Recent e. Coli bacteremia treated at OSH.  Not a candidate for lap chole at that time so IR consulted with plans for perc cholecystotomy tube place 11/23 however on repeat imaging, gallbladder distension was resolved so tube placement was deferred. Intubated 11/22-11/26. Has a history of myasthenia gravis. MD note states "Does not appear neuromuscularly weak.  He is able to draw good tidal volume and  minute ventilation and pressure support breath." Pt had severe dysphagia in 2016.    Assessment / Plan / Recommendation Clinical Impression  Pt seen on they day of extubation; vocal quality only very mildly hoarse. Mentation gradually improving as RN reported pt was not speaking intelligibly, but with SLP he was intelligible and oriented to self and place. Gradual bites of ice, sips of water and bites of puree tolerated well. 3 oz water swallow attempted, but pt did have a belch and throat clear and did not fully complete 3 oz. After trialing graham crackers pt began to have some coughing at the end of session. Advise RN to offer small sips of water this evening and meds in puree. SLP will f/u tomorrow to potentially advance diet if pt doing well.  SLP Visit Diagnosis: Dysphagia, oropharyngeal phase (R13.12)    Aspiration Risk  Mild aspiration risk    Diet Recommendation NPO except meds;Ice chips PRN after oral care;Free water protocol after oral care   Liquid Administration via: Cup;Straw Medication Administration: Crushed with puree Supervision: Staff to assist with self feeding Compensations: Slow rate;Small sips/bites Postural Changes: Seated upright at 90 degrees    Other  Recommendations Oral Care Recommendations: Oral care BID Other Recommendations: Have oral suction available   Follow up Recommendations        Frequency and Duration min 2x/week  2 weeks       Prognosis Prognosis for Safe Diet Advancement: Good  Swallow Study   General HPI: 83 yo male who initally presented to [facility] with findings consistent with sepsis. Recent e. Coli bacteremia treated at OSH.  Not a candidate for lap chole at that time so IR consulted with plans for perc cholecystotomy tube place 11/23 however on repeat imaging, gallbladder distension was resolved so tube placement was deferred. Intubated 11/22-11/26. Has a history of myasthenia gravis. MD note states "Does not appear  neuromuscularly weak.  He is able to draw good tidal volume and minute ventilation and pressure support breath." Pt had severe dysphagia in 2016.  Type of Study: Bedside Swallow Evaluation Diet Prior to this Study: NPO Temperature Spikes Noted: No Respiratory Status: Nasal cannula History of Recent Intubation: Yes Length of Intubations (days): 5 days Date extubated: 01/21/19 Behavior/Cognition: Alert;Cooperative;Requires cueing Oral Cavity Assessment: Within Functional Limits Oral Care Completed by SLP: No Oral Cavity - Dentition: Adequate natural dentition Self-Feeding Abilities: Needs assist Patient Positioning: Upright in bed Baseline Vocal Quality: Hoarse(very slightly hoarse) Volitional Cough: Strong Volitional Swallow: Able to elicit    Oral/Motor/Sensory Function Overall Oral Motor/Sensory Function: Within functional limits   Ice Chips Ice chips: Within functional limits Presentation: Spoon   Thin Liquid Thin Liquid: Within functional limits Presentation: Cup;Straw    Nectar Thick Nectar Thick Liquid: Not tested   Honey Thick Honey Thick Liquid: Not tested   Puree Puree: Within functional limits Presentation: Spoon   Solid     Solid: Impaired Oral Phase Impairments: Poor awareness of bolus Pharyngeal Phase Impairments: Cough - Delayed     Herbie Baltimore, MA CCC-SLP  Acute Rehabilitation Services Pager [phone] Office [phone]  Lynann Beaver 01/21/2019,4:10 PM

## 2019-01-21 NOTE — Progress Notes (Signed)
Progress Note  Patient Name: Duane Burns Date of Encounter: 01/21/2019  Primary Cardiologist: Kate Sable, MD   Subjective   Remains intubated. Awake on vent but had to be sedated due to agitation. Diuresed well overnight. Out almost 4.5L   On NE at 4 co-ox 55% CVP 3 Renal function continues to improve. Creatinine 2.1  K 2.7 (being supped)    Inpatient Medications    Scheduled Meds:  aspirin  81 mg Per Tube Daily   atorvastatin  80 mg Per Tube q1800   chlorhexidine gluconate (MEDLINE KIT)  15 mL Mouth Rinse BID   Chlorhexidine Gluconate Cloth  6 each Topical Daily   cholecalciferol  1,000 Units Oral Daily   clopidogrel  75 mg Per Tube Daily   feeding supplement (PRO-STAT SUGAR FREE 64)  30 mL Per Tube BID   hydrocortisone sod succinate (SOLU-CORTEF) inj  50 mg Intravenous Q6H   mouth rinse  15 mL Mouth Rinse 10 times per day   multivitamin with minerals  1 tablet Per Tube Daily   pyridostigmine  60 mg Per Tube TID   senna-docusate  2 tablet Per Tube BID   sodium chloride flush  3 mL Intravenous Q12H   Continuous Infusions:  sodium chloride Stopped (01/21/19 0644)   famotidine (PEPCID) IV Stopped (01/20/19 2229)   feeding supplement (VITAL AF 1.2 CAL) 1,000 mL (01/20/19 1031)   fentaNYL infusion INTRAVENOUS 100 mcg/hr (01/21/19 0700)   heparin 650 Units/hr (01/21/19 0700)   meropenem (MERREM) IV 200 mL/hr at 01/21/19 0700   norepinephrine (LEVOPHED) Adult infusion 4 mcg/min (01/21/19 0700)   PRN Meds: sodium chloride, acetaminophen **OR** acetaminophen, bisacodyl, fentaNYL (SUBLIMAZE) injection, guaiFENesin, levalbuterol, midazolam, ondansetron **OR** ondansetron (ZOFRAN) IV, polyethylene glycol, sennosides, sodium chloride flush, traZODone   Vital Signs    Vitals:   01/21/19 0600 01/21/19 0700 01/21/19 0815 01/21/19 0828  BP:      Pulse: (!) 48 72    Resp: (!) 22 18    Temp:    98.6 F (37 C)  TempSrc:    Oral  SpO2: 100% 91% 97%    Weight:      Height:        Intake/Output Summary (Last 24 hours) at 01/21/2019 0916 Last data filed at 01/21/2019 0800 Gross per 24 hour  Intake 1133.16 ml  Output 4460 ml  Net -3326.84 ml   Filed Weights   01/19/19 0620 01/20/19 0500 01/21/19 0500  Weight: 79.6 kg 81 kg 79.6 kg    Telemetry    Sinus with frequent PACs 60-90 - Personally Reviewed  Physical Exam   General:  Elderly weak appearing. Sedated on vent HEENT: normal + ETT Neck: supple. no JVD.  LIJ TLC Carotids 2+ bilat; no bruits. No lymphadenopathy or thryomegaly appreciated. Cor: PMI nondisplaced. Irregular rate & rhythm. No rubs, gallops or murmurs. Lungs: clear Abdomen: soft, nontender, nondistended. No hepatosplenomegaly. No bruits or masses. Good bowel sounds. Extremities: no cyanosis, clubbing, rash, edema Neuro:sedated on vent    Labs    Chemistry Recent Labs  Lab 01/16/19 1024  01/17/19 0229  01/19/19 0345  01/20/19 0408 01/21/19 0400 01/21/19 0509  NA 142   < > 139   < > 144   < > 143 146* 147*  K 4.8   < > 5.0   < > 3.4*   < > 4.0 3.0* 2.7*  CL 110  --  110   < > 109  --  110 107  --  CO2 18*  --  18*   < > 22  --  22 26  --   GLUCOSE 136*  --  132*   < > 84  --  160* 161*  --   BUN 32*  --  44*   < > 57*  --  64* 77*  --   CREATININE 2.44*  --  2.98*   < > 2.87*  --  2.23* 2.11*  --   CALCIUM 7.6*  --  7.5*   < > 7.9*  --  7.8* 8.0*  --   PROT 5.6*  --  5.5*  --  5.1*  --   --   --   --   ALBUMIN 2.9*  --  2.6*  --  2.4*  --   --   --   --   AST 544*  --  410*  --  78*  --   --   --   --   ALT 183*  --  159*  --  88*  --   --   --   --   ALKPHOS 65  --  72  --  82  --   --   --   --   BILITOT 0.8  --  1.0  --  2.5*  --   --   --   --   GFRNONAA 24*  --  19*   < > 20*  --  27* 28*  --   GFRAA 28*  --  22*   < > 23*  --  31* 33*  --   ANIONGAP 14  --  11   < > 13  --  11 13  --    < > = values in this interval not displayed.     Hematology Recent Labs  Lab 01/19/19 0345   01/20/19 0408 01/21/19 0400 01/21/19 0509  WBC 24.4*  --  23.2* 29.9*  --   RBC 3.75*  --  3.54* 3.69*  --   HGB 11.1*   < > 10.4* 10.8* 10.5*  HCT 33.5*   < > 32.1* 33.5* 31.0*  MCV 89.3  --  90.7 90.8  --   MCH 29.6  --  29.4 29.3  --   MCHC 33.1  --  32.4 32.2  --   RDW 14.1  --  14.1 14.1  --   PLT 121*  --  105* 148*  --    < > = values in this interval not displayed.    Cardiac EnzymesNo results for input(s): TROPONINI in the last 168 hours. No results for input(s): TROPIPOC in the last 168 hours.   BNP Recent Labs  Lab 01/16/19 1024  BNP 4,082.7*     DDimer No results for input(s): DDIMER in the last 168 hours.   Radiology    Dg Chest Port 1 View  Result Date: 01/21/2019 CLINICAL DATA:  Postop evaluation.  Endotracheal tube assessment. EXAM: PORTABLE CHEST 1 VIEW COMPARISON:  01/20/2019 FINDINGS: Endotracheal tube has tip 5.1 cm above the carina. Enteric tube courses into the stomach and off the film as tip is not visualized. Right IJ central venous catheter has tip at the cavoatrial junction unchanged. Lungs are somewhat hypoinflated without evidence of pneumothorax. No significant effusion. Mild hazy opacification over the right base unchanged. Subtle hazy prominence of the perihilar markings with interval improvement. Cardiomediastinal silhouette and remainder of the exam is unchanged. IMPRESSION: 1. Interval improvement minimal vascular congestion.  Stable minimal hazy opacification right base. 2.  Tubes and lines as described. Electronically Signed   By: Marin Olp M.D.   On: 01/21/2019 09:10   Dg Chest Port 1 View  Result Date: 01/20/2019 CLINICAL DATA:  Endotracheal tube, respiratory failure. EXAM: PORTABLE CHEST 1 VIEW COMPARISON:  January 19, 2019. FINDINGS: Stable cardiomegaly. Endotracheal and nasogastric tubes are unchanged in position. Right internal jugular catheter is unchanged. No pneumothorax is noted. Stable bibasilar atelectasis is noted. Bony thorax  is unremarkable. IMPRESSION: Stable support apparatus. Stable bibasilar subsegmental atelectasis. Electronically Signed   By: Marijo Conception M.D.   On: 01/20/2019 07:34    Cardiac Studies   Echo 11/21  1. Left ventricular ejection fraction, by visual estimation, is 20 to 25%. The left ventricle has severely decreased function. There is no left ventricular hypertrophy.  2. Diffuse hypokinesis with most prominent severe hypokinesis of the anteroseptal wall and the entire apex.  3. Global right ventricle was not assessed.The right ventricular size is not assessed. Right vetricular wall thickness was not assessed.  4. Left atrial size was not assessed.  5. Right atrial size was not assessed.  6. The pericardium was not assessed.  7. The mitral valve was not assessed. not assessed mitral valve regurgitation.  8. The tricuspid valve is not assessed. Tricuspid valve regurgitation not assessed.  9. Aortic valve regurgitation Not assessed. 10. The aortic valve was not assessed. Aortic valve regurgitation Not assessed. 11. The pulmonic valve was not assessed. Pulmonic valve regurgitation Not assessed. 12. Not assessed. 13. The interatrial septum was not assessed.  ECHO 11/20 12/2018 LEFT VENTRICLE PLAX 2D LVIDd: 4.17 cm LVIDs: 2.50 cm LV PW: 1.26 cm LV IVS: 1.30 cm LVOT diam: 1.80 cm LV SV: 55 ml LV SV Index: 26.59 LVOT Area: 2.54 cm  06/2018 +------------------+------------++  AORTIC VALVE      +------------------+------------++  AV Area (Vmax):  0.88 cm    +------------------+------------++  AV Area (Vmean):  0.89 cm    +------------------+------------++  AV Area (VTI):  0.90 cm    +------------------+------------++  AV Vmax:  372.81 cm/s    +------------------+------------++  AV Vmean:  255.916 cm/s   +------------------+------------++  AV VTI:  0.923 m     +------------------+------------++  AV Peak Grad:  55.6 mmHg    +------------------+------------++  AV Mean Grad:  30.9 mmHg    +------------------+------------++  LVOT Vmax:  86.15 cm/s    +------------------+------------++  LVOT Vmean:  60.100 cm/s    +------------------+------------++  LVOT VTI:  0.220 m    +------------------+------------++  LVOT/AV VTI ratio: 0.24    +------------------+------------++  AR PHT:  779 msec    +------------------+------------++    Patient Profile     Lucian Baswell is a 83yo male with PMH HTN, moderate to severe AS, myasthenia gravis, HLD, recent e. Coli bacteremia treated at [facility] (10/19-10/29) presenting with sepsis secondary to acute cholecystitis with acute HF and severe aortic stenosis.   Assessment & Plan    1. Shock - combined sepsis/cardiogenic  - PCT very high (67!) - Sepsis now resolving. Initially felt to be cholecystitis but now felt less likely - On meropenem (vanc stopped) Cx negative  - Had e. coli sepsis recently. So this is recurrent. Source unclear (GI vs GU) -> CCM managing - Remains on NE for BP and cardiac support. Co-ox 54%   2. Acute Systolic HF -> cardiogenic shock - Echo newly down to 20-25% with severe AS from 60-65% with mod-severe AS on 06/2018. - hs  Trop > 27K  - suspect combination of ischemic and septic CM - Volume status much improved  - co-ox 55% on NE 4 - continuing NE with goal SBP > 95 and Co-ox > 55% - CVP 3 stop IV lasix today - R&L cath once renal function recovers fully and condition permits  - Hopefully can start HF meds soon as BP and renal function permit - Will add spiro today   3. Acute hypoxic respiratory failure - due to HF and sepsis - CXR improved with diuresis - weaned well this am but became agitated and now sedated. Hopefully may be able to extubate later today - CCM managing  4. Severe Aortic Stenosis - Worsened  AS since May of this year. Will need R&L heart cath after he is stable and has improvement in renal function - eventual evaluation for TAVR.   5. AKI - due to shock/ATN - baseline creatinine 1.6 - Improving with hemodynamic support Creatinine down to 2.1   6. Myasthenia gravis  - on stress-dose steroids - watch for precipitation of MG crisis   - consider IVIG if continued SBT failure.   7. PAF - had AF overnight on 11/23 - now in NSR. Has not recurred  - Will continue heparin for now. If AF does not recur would not proceed with long-term AC as risk of triple therapy quite high for him   8. New LBBB - suspect ischemic  9. Hypokalemia - supp K aggressively. Discussed dosing with PharmD personally. - recheck BMET this afternoon.   For questions or updates, please contact [place] Please consult [email or web address] for contact info under Cardiology/STEMI.   CRITICAL CARE Performed by: Glori Bickers  Total critical care time: 35 minutes  Critical care time was exclusive of separately billable procedures and treating other patients.  Critical care was necessary to treat or prevent imminent or life-threatening deterioration.  Critical care was time spent personally by me (independent of midlevel providers or residents) on the following activities: development of treatment plan with patient and/or surrogate as well as nursing, discussions with consultants, evaluation of patient's response to treatment, examination of patient, obtaining history from patient or surrogate, ordering and performing treatments and interventions, ordering and review of laboratory studies, ordering and review of radiographic studies, pulse oximetry and re-evaluation of patient's condition.      Signed, Glori Bickers, MD  01/21/2019, 9:16 AM

## 2019-01-21 NOTE — Progress Notes (Signed)
ANTICOAGULATION CONSULT NOTE - Follow Up Consult  Pharmacy Consult for heparin Indication: NSTEMI  Labs: Recent Labs    01/18/19 0400  01/19/19 0345  01/20/19 0318 01/20/19 0408 01/20/19 1337 01/21/19 0121  HGB 11.6*   < > 11.1*  --  15.0 10.4*  --   --   HCT 35.6*   < > 33.5*  --  44.0 32.1*  --   --   PLT 185  --  121*  --   --  105*  --   --   HEPARINUNFRC <0.10*   < >  --    < >  --  1.03* 1.14* 1.00*  CREATININE 3.40*  --  2.87*  --   --  2.23*  --   --    < > = values in this interval not displayed.    Assessment: 83yo male remains supratherapeutic on heparin after rate change though level is lower; no gtt issues or signs of bleeding per RN.  Goal of Therapy:  Heparin level 0.3-0.7 units/ml   Plan:  Will decrease heparin gtt by 3 units/kg/hr to 650 units/hr and check level in 8 hours.    Wynona Neat, PharmD, BCPS  01/21/2019,1:54 AM

## 2019-01-21 NOTE — Progress Notes (Addendum)
Pharmacy Antibiotic Note  Duane Burns is a 83 y.o. male admitted on 12/29/2018 with sepsis.  Pharmacy has been consulted for vancomycin and meropenem dosing. AKI slowly resolving, vancomycin off, unclear source for sepsis at this point. Per CCM, transition to Unasyn x5 more days.   Plan: Stop meropenem Unasyn 1.5g IV q12h  Height: 5\' 8"  (172.7 cm) Weight: 175 lb 7.8 oz (79.6 kg) IBW/kg (Calculated) : 68.4  Temp (24hrs), Avg:98.4 F (36.9 C), Min:98.2 F (36.8 C), Max:98.8 F (37.1 C)  Recent Labs  Lab 01/03/2019 1830  01/16/19 1024 01/17/19 0229 01/17/19 0821 01/17/19 2148 01/17/19 2150 01/18/19 0013 01/18/19 0400 01/19/19 0345 01/20/19 0408 01/21/19 0400  WBC  --    < > 57.0* 52.8*  --   --   --   --  42.7* 24.4* 23.2* 29.9*  CREATININE  --    < > 2.44* 2.98*  --  3.29*  --   --  3.40* 2.87* 2.23* 2.11*  LATICACIDVEN 2.9*  --  4.7* 3.5*  --   --  2.3* 1.7  --   --   --   --   VANCORANDOM  --   --   --   --  16  --   --   --   --   --   --   --    < > = values in this interval not displayed.    Estimated Creatinine Clearance: 26.6 mL/min (A) (by C-G formula based on SCr of 2.11 mg/dL (H)).    No Known Allergies  Antimicrobials this admission:  11/20 Vanc >> 11/25 11/20 Meropenem >>11/26 11/26 >> Unasyn >>  Dose adjustments this admission:  11/22 Vancomycin random level of 16  Microbiology results:  11/20 COVID: neg 11/20 BCx: neg 11/20 UCx: neg 11/20 MRSA PCR:neg 11/22 bldx2: NGTD  Thank you for allowing pharmacy to be a part of this patient's care.  Arrie Senate, PharmD, BCPS Clinical Pharmacist [phone] Please check AMION for all [place] numbers 01/21/2019

## 2019-01-21 NOTE — Progress Notes (Signed)
ANTICOAGULATION CONSULT NOTE Pharmacy Consult for:  Heparin Indication: ACS/STEMI  No Known Allergies  Patient Measurements: Height: 5\' 8"  (172.7 cm) Weight: 175 lb 7.8 oz (79.6 kg) IBW/kg (Calculated) : 68.4 HEPARIN DW (KG): 79.6 kg  Vital Signs: Temp: 97.5 F (36.4 C) (11/26 2030) Temp Source: Axillary (11/26 2030) BP: 117/66 (11/26 1900) Pulse Rate: 93 (11/26 1900)  Labs: Recent Labs    01/19/19 0345  01/20/19 0408  01/21/19 0121 01/21/19 0400 01/21/19 0509 01/21/19 1059 01/21/19 1652 01/21/19 2000  HGB 11.1*   < > 10.4*  --   --  10.8* 10.5*  --   --   --   HCT 33.5*   < > 32.1*  --   --  33.5* 31.0*  --   --   --   PLT 121*  --  105*  --   --  148*  --   --   --   --   HEPARINUNFRC  --    < > 1.03*   < > 1.00*  --   --  0.94*  --  0.47  CREATININE 2.87*  --  2.23*  --   --  2.11*  --   --  2.10*  --    < > = values in this interval not displayed.    Estimated Creatinine Clearance: 26.7 mL/min (A) (by C-G formula based on SCr of 2.1 mg/dL (H)).   Assessment: 83 yo male with NSTEMI and elevated troponin. Pharmacy asked to dose heparin. No anticoagulation prior to admission.  Heparin level 0.47 with in range on heparin drip 500 uts/hr.  CBC stable, no bleeding noted.    Goal of Therapy:  Heparin level 0.3-0.7 units/ml Monitor platelets by anticoagulation protocol: Yes   Plan:  Contnue heparin drip 500 uts/hr  Daily CBC, HL   Bonnita Nasuti Pharm.D. CPP, BCPS Clinical Pharmacist [phone] 01/21/2019 9:37 PM

## 2019-01-21 NOTE — Progress Notes (Signed)
[place] Progress Note Patient Name: Duane Burns DOB: [birth year] MRN: [id number]   Date of Service  01/21/2019  HPI/Events of Note  K+ 3.0, Creatinine 2.17  eICU Interventions  KCL 10 meq iv Q 1 hour x 3 doses        [place]or[place]wo U Ogan 01/21/2019, 5:15 AM

## 2019-01-21 NOTE — Progress Notes (Signed)
Late Entry  Patient has been increasingly agitated and c/o itching. No rash noted. One time dose IV benadryl given. No results. Patient continues to become more and more agitated, pulling at lines, removing clothing, removing telemetry wires, attempted to hit RN. Dr. Valeta Harms notified - one time dose haldol given. No results. After continued episodes, precedex drip and restraints reordered. During these episodes, patient's HR became increased to 150-160 and sustained. Appeared to be afib rvr. Cardiology NP notified who came to unit to assess. Pt fluctuating between nsr, afib, and a flutter. No new orders; stated to continue to monitor.  Joellen Jersey, RN

## 2019-01-21 NOTE — Procedures (Signed)
Extubation Procedure Note  Patient Details:   Name: [name] DOB: [birth year] MRN: [id number]   Airway Documentation:    Vent end date: 01/21/19 Vent end time: 1141   Evaluation  O2 sats: stable throughout Complications: No apparent complications Patient did tolerate procedure well. Bilateral Breath Sounds: Clear, Diminished   Yes  Patient placed on 6l/min [place] Soft vocalization, no stridor    Revonda Standard 01/21/2019, 11:42 AM

## 2019-01-21 NOTE — Plan of Care (Signed)
  Problem: Education: Goal: Knowledge of General Education information will improve Description: Including pain rating scale, medication(s)/side effects and non-pharmacologic comfort measures Outcome: Not Progressing Note: Patient very agitated and appears confused.

## 2019-01-22 DIAGNOSIS — N179 Acute kidney failure, unspecified: Secondary | ICD-10-CM

## 2019-01-22 LAB — BASIC METABOLIC PANEL
Anion gap: 14 (ref 5–15)
BUN: 74 mg/dL — ABNORMAL HIGH (ref 8–23)
CO2: 26 mmol/L (ref 22–32)
Calcium: 8.4 mg/dL — ABNORMAL LOW (ref 8.9–10.3)
Chloride: 112 mmol/L — ABNORMAL HIGH (ref 98–111)
Creatinine, Ser: 1.76 mg/dL — ABNORMAL HIGH (ref 0.61–1.24)
GFR calc Af Amer: 41 mL/min — ABNORMAL LOW (ref >60)
GFR calc non Af Amer: 35 mL/min — ABNORMAL LOW (ref >60)
Glucose, Bld: 92 mg/dL (ref 70–99)
Potassium: 3.3 mmol/L — ABNORMAL LOW (ref 3.5–5.1)
Sodium: 152 mmol/L — ABNORMAL HIGH (ref 135–145)

## 2019-01-22 LAB — COOXEMETRY PANEL
Carboxyhemoglobin: 0.6 % (ref 0.5–1.5)
Carboxyhemoglobin: 0.7 % (ref 0.5–1.5)
Methemoglobin: 0.8 % (ref 0.0–1.5)
Methemoglobin: 0.8 % (ref 0.0–1.5)
O2 Saturation: 58.7 %
O2 Saturation: 52.4 %
Total hemoglobin: 11.4 g/dL — ABNORMAL LOW (ref 12.0–16.0)
Total hemoglobin: 14.3 g/dL (ref 12.0–16.0)

## 2019-01-22 LAB — CBC
HCT: 35.2 % — ABNORMAL LOW (ref 39.0–52.0)
Hemoglobin: 11.3 g/dL — ABNORMAL LOW (ref 13.0–17.0)
MCH: 29.4 pg (ref 26.0–34.0)
MCHC: 32.1 g/dL (ref 30.0–36.0)
MCV: 91.7 fL (ref 80.0–100.0)
Platelets: 158 10*3/uL (ref 150–400)
RBC: 3.84 MIL/uL — ABNORMAL LOW (ref 4.22–5.81)
RDW: 14.2 % (ref 11.5–15.5)
WBC: 31.3 10*3/uL — ABNORMAL HIGH (ref 4.0–10.5)
nRBC: 0 % (ref 0.0–0.2)

## 2019-01-22 LAB — CULTURE, BLOOD (ROUTINE X 2)
Culture: NO GROWTH
Culture: NO GROWTH
Special Requests: ADEQUATE

## 2019-01-22 LAB — GLUCOSE, CAPILLARY
Glucose-Capillary: 109 mg/dL — ABNORMAL HIGH (ref 70–99)
Glucose-Capillary: 85 mg/dL (ref 70–99)
Glucose-Capillary: 86 mg/dL (ref 70–99)
Glucose-Capillary: 87 mg/dL (ref 70–99)
Glucose-Capillary: 89 mg/dL (ref 70–99)
Glucose-Capillary: 92 mg/dL (ref 70–99)

## 2019-01-22 LAB — HEPARIN LEVEL (UNFRACTIONATED): Heparin Unfractionated: 0.5 IU/mL (ref 0.30–0.70)

## 2019-01-22 LAB — MAGNESIUM: Magnesium: 2.1 mg/dL (ref 1.7–2.4)

## 2019-01-22 MED ORDER — ACETAMINOPHEN 325 MG PO TABS: 650.0000 mg | ORAL_TABLET | Freq: Four times a day (QID) | ORAL | Status: DC | PRN

## 2019-01-22 MED ORDER — ONDANSETRON HCL 4 MG/2ML IJ SOLN: 4.0000 mg | Freq: Four times a day (QID) | INTRAMUSCULAR | Status: DC | PRN

## 2019-01-22 MED ORDER — ONDANSETRON HCL 4 MG PO TABS: 4.0000 mg | ORAL_TABLET | Freq: Four times a day (QID) | ORAL | Status: DC | PRN

## 2019-01-22 MED ORDER — METRONIDAZOLE IN NACL 5-0.79 MG/ML-% IV SOLN
500.0000 mg | Freq: Three times a day (TID) | INTRAVENOUS | Status: DC
  Administered 2019-01-22 – 2019-01-24 (×5): 500 mg via INTRAVENOUS
  Filled 2019-01-22 (×5): qty 100

## 2019-01-22 MED ORDER — SODIUM CHLORIDE 0.9 % IV SOLN
2.0000 g | Freq: Two times a day (BID) | INTRAVENOUS | Status: DC
  Administered 2019-01-22 – 2019-01-23 (×2): 2 g via INTRAVENOUS
  Filled 2019-01-22 (×3): qty 2

## 2019-01-22 MED ORDER — PIPERACILLIN-TAZOBACTAM 3.375 G IVPB
3.3750 g | Freq: Three times a day (TID) | INTRAVENOUS | Status: DC
  Administered 2019-01-22: 3.375 g via INTRAVENOUS
  Filled 2019-01-22: qty 50

## 2019-01-22 MED ORDER — SODIUM CHLORIDE 0.9% FLUSH
10.0000 mL | Freq: Two times a day (BID) | INTRAVENOUS | Status: DC
  Administered 2019-01-22 – 2019-01-23 (×2): 10 mL
  Administered 2019-01-23: 20 mL
  Administered 2019-01-24: 10 mL
  Administered 2019-01-24: 30 mL
  Administered 2019-01-25 – 2019-01-28 (×7): 10 mL
  Administered 2019-01-29: 30 mL
  Administered 2019-01-30: 10 mL

## 2019-01-22 MED ORDER — SODIUM CHLORIDE 0.9% FLUSH: 10.0000 mL | INTRAVENOUS | Status: DC | PRN

## 2019-01-22 MED ORDER — PYRIDOSTIGMINE BROMIDE 60 MG PO TABS
60.0000 mg | ORAL_TABLET | Freq: Three times a day (TID) | ORAL | Status: DC
  Administered 2019-01-22 – 2019-01-24 (×9): 60 mg via ORAL
  Filled 2019-01-22 (×12): qty 1

## 2019-01-22 MED ORDER — ATORVASTATIN CALCIUM 80 MG PO TABS
80.0000 mg | ORAL_TABLET | Freq: Every day | ORAL | Status: DC
  Administered 2019-01-22 – 2019-01-24 (×3): 80 mg via ORAL
  Filled 2019-01-22 (×2): qty 1

## 2019-01-22 MED ORDER — ASPIRIN 81 MG PO CHEW
81.0000 mg | CHEWABLE_TABLET | Freq: Every day | ORAL | Status: DC
  Administered 2019-01-22 – 2019-01-24 (×3): 81 mg via ORAL
  Filled 2019-01-22 (×3): qty 1

## 2019-01-22 MED ORDER — PREDNISONE 10 MG PO TABS
10.0000 mg | ORAL_TABLET | Freq: Every day | ORAL | Status: DC
  Administered 2019-01-23 – 2019-01-24 (×2): 10 mg via ORAL
  Filled 2019-01-22 (×2): qty 1

## 2019-01-22 MED ORDER — HEPARIN SODIUM (PORCINE) 5000 UNIT/ML IJ SOLN
5000.0000 [IU] | Freq: Three times a day (TID) | INTRAMUSCULAR | Status: DC
  Administered 2019-01-22 – 2019-01-27 (×16): 5000 [IU] via SUBCUTANEOUS
  Filled 2019-01-22 (×14): qty 1

## 2019-01-22 MED ORDER — HALOPERIDOL LACTATE 5 MG/ML IJ SOLN
2.0000 mg | Freq: Four times a day (QID) | INTRAMUSCULAR | Status: DC | PRN
  Administered 2019-01-22 – 2019-01-26 (×2): 2 mg via INTRAVENOUS
  Filled 2019-01-22 (×2): qty 1

## 2019-01-22 MED ORDER — ACETAMINOPHEN 650 MG RE SUPP: 650.0000 mg | Freq: Four times a day (QID) | RECTAL | Status: DC | PRN

## 2019-01-22 MED ORDER — POTASSIUM CHLORIDE 10 MEQ/50ML IV SOLN
10.0000 meq | INTRAVENOUS | Status: AC
  Administered 2019-01-22 (×5): 10 meq via INTRAVENOUS
  Filled 2019-01-22: qty 50

## 2019-01-22 MED ORDER — POTASSIUM CHLORIDE CRYS ER 20 MEQ PO TBCR
20.0000 meq | EXTENDED_RELEASE_TABLET | Freq: Every day | ORAL | Status: DC
  Administered 2019-01-23: 20 meq via ORAL
  Filled 2019-01-22 (×3): qty 1

## 2019-01-22 MED ORDER — ADULT MULTIVITAMIN W/MINERALS CH
1.0000 | ORAL_TABLET | Freq: Every day | ORAL | Status: DC
  Administered 2019-01-23 – 2019-01-24 (×2): 1 via ORAL
  Filled 2019-01-22 (×2): qty 1

## 2019-01-22 MED ORDER — SENNOSIDES-DOCUSATE SODIUM 8.6-50 MG PO TABS
2.0000 | ORAL_TABLET | Freq: Two times a day (BID) | ORAL | Status: DC
  Administered 2019-01-24: 2 via ORAL
  Filled 2019-01-22: qty 2

## 2019-01-22 MED ORDER — GERHARDT'S BUTT CREAM
TOPICAL_CREAM | Freq: Two times a day (BID) | CUTANEOUS | Status: DC
  Administered 2019-01-22: 1 via TOPICAL
  Administered 2019-01-22 – 2019-01-23 (×2): via TOPICAL
  Administered 2019-01-23: 1 via TOPICAL
  Administered 2019-01-24 (×2): via TOPICAL
  Administered 2019-01-25: 1 via TOPICAL
  Administered 2019-01-25 – 2019-01-26 (×3): via TOPICAL
  Administered 2019-01-27 – 2019-01-28 (×4): 1 via TOPICAL
  Administered 2019-01-29 – 2019-01-30 (×3): via TOPICAL
  Filled 2019-01-22 (×2): qty 1

## 2019-01-22 MED ORDER — CLOPIDOGREL BISULFATE 75 MG PO TABS
75.0000 mg | ORAL_TABLET | Freq: Every day | ORAL | Status: DC
  Administered 2019-01-22 – 2019-01-24 (×3): 75 mg via ORAL
  Filled 2019-01-22 (×3): qty 1

## 2019-01-22 MED ORDER — SPIRONOLACTONE 12.5 MG HALF TABLET
12.5000 mg | ORAL_TABLET | Freq: Every day | ORAL | Status: DC
  Administered 2019-01-23 – 2019-01-24 (×2): 12.5 mg via ORAL
  Filled 2019-01-22 (×3): qty 1

## 2019-01-22 NOTE — Progress Notes (Signed)
Pharmacy Antibiotic Note  Duane Burns is a 83 y.o. male admitted on 01/05/2019 with sepsis of unclear source. Patient was on vancomycin and meropenem which was switched yesterday to Unasyn for 5 more days. Given hypernatremia and high sodium content of Unasyn, switched to Zosyn.  WBC remains elevated at 31.3. Remains afebrile. Renal function improving with Scr 1.76.  Pharmacy consulted to change Zosyn to cefepime and Flagyl due to patient developing new rash (initially reported itching 11/26 per RN), possibly related to Zosyn.   Plan: Stop Zosyn Start Cefepime 2g IV q12h and Flagyl 500mg  IV q8h Monitor clinical progress, c/s, renal function    Height: 5\' 8"  (172.7 cm) Weight: 167 lb 15.9 oz (76.2 kg) IBW/kg (Calculated) : 68.4  Temp (24hrs), Avg:97.8 F (36.6 C), Min:97 F (36.1 C), Max:98.8 F (37.1 C)  Recent Labs  Lab 01/16/19 1024 01/17/19 0229 01/17/19 0821  01/17/19 2150 01/18/19 0013 01/18/19 0400 01/19/19 0345 01/20/19 0408 01/21/19 0400 01/21/19 1652 01/22/19 0440  WBC 57.0* 52.8*  --   --   --   --  42.7* 24.4* 23.2* 29.9*  --  31.3*  CREATININE 2.44* 2.98*  --    < >  --   --  3.40* 2.87* 2.23* 2.11* 2.10* 1.76*  LATICACIDVEN 4.7* 3.5*  --   --  2.3* 1.7  --   --   --   --   --   --   VANCORANDOM  --   --  16  --   --   --   --   --   --   --   --   --    < > = values in this interval not displayed.    Estimated Creatinine Clearance: 31.8 mL/min (A) (by C-G formula based on SCr of 1.76 mg/dL (H)).    No Known Allergies  Antimicrobials this admission:  11/20 Vanc >> 11/25 11/20 Meropenem >>11/26 11/26 Unasyn >> 11/27 11/27 Zosyn >> 11/27 11/27 Cefepime >> 11/27 Flagyl >>  Dose adjustments this admission:  11/22 Vancomycin random level of 16  Microbiology results:  11/20 COVID: neg 11/20 BCx: neg 11/20 UCx: neg 11/20 MRSA PCR:neg 11/22 bldx2: Ilda Mori, PharmD, BCPS Please check AMION for all [place] contact numbers Clinical  Pharmacist 01/22/2019 6:52 PM

## 2019-01-22 NOTE — Progress Notes (Signed)
Pharmacy Antibiotic Note  Duane Burns is a 83 y.o. male admitted on 01/13/2019 with sepsis of unclear source. Patient was on vancomycin and meropenem which was switched yesterday to Unasyn for 5 more days. Given hypernatremia and high sodium content of Unasyn, will switch to Zosyn.  WBC remains elevated at 31.3. Remains afebrile. Renal function improving with Scr 1.76, making 2.6L urine yesterday.   Plan: Stop Unasyn Start Zosyn 3.375 g IV Q8 hrs Monitor clinical progression and renal function   Height: 5\' 8"  (172.7 cm) Weight: 167 lb 15.9 oz (76.2 kg) IBW/kg (Calculated) : 68.4  Temp (24hrs), Avg:97.7 F (36.5 C), Min:97 F (36.1 C), Max:98.1 F (36.7 C)  Recent Labs  Lab 12/31/2018 1830  01/16/19 1024 01/17/19 0229 01/17/19 0821  01/17/19 2150 01/18/19 0013 01/18/19 0400 01/19/19 0345 01/20/19 0408 01/21/19 0400 01/21/19 1652 01/22/19 0440  WBC  --    < > 57.0* 52.8*  --   --   --   --  42.7* 24.4* 23.2* 29.9*  --  31.3*  CREATININE  --    < > 2.44* 2.98*  --    < >  --   --  3.40* 2.87* 2.23* 2.11* 2.10* 1.76*  LATICACIDVEN 2.9*  --  4.7* 3.5*  --   --  2.3* 1.7  --   --   --   --   --   --   VANCORANDOM  --   --   --   --  16  --   --   --   --   --   --   --   --   --    < > = values in this interval not displayed.    Estimated Creatinine Clearance: 31.8 mL/min (A) (by C-G formula based on SCr of 1.76 mg/dL (H)).    No Known Allergies  Antimicrobials this admission:  11/20 Vanc >> 11/25 11/20 Meropenem >>11/26 11/26 Unasyn >> 11/27 11/27 Zosyn >>  Dose adjustments this admission:  11/22 Vancomycin random level of 16  Microbiology results:  11/20 COVID: neg 11/20 BCx: neg 11/20 UCx: neg 11/20 MRSA PCR:neg 11/22 bldx2: Garret Reddish, PharmD PGY1 Pharmacy Resident Phone: [phone] 01/22/2019  1:31 PM  Please check [email or web address] for unit-specific pharmacy phone numbers.

## 2019-01-22 NOTE — Progress Notes (Signed)
New Rash noted on pt's bottom and thighs bilaterally, looks like rash related to an allergic reaction.  MD notified, NP Whitney at bedside to assess.  Zosyn on hold for the moment, until direction from MD team rec'd.    Karsten Ro, RN

## 2019-01-22 NOTE — Progress Notes (Addendum)
NAME:  Duane Burns, MRN:  [id number], DOB:  07-11-1935, LOS: 7 ADMISSION DATE:  01/19/2019,  CHIEF COMPLAINT:  Chest pain  Brief History   83 yo male who initally presented to [facility] with findings consistent with sepsis. Recent e. Coli bacteremia treated at OSH. While at [facility], patient developed chest pain with multiple PACs and PVCs and was subsequently transferred to [facility] for further evaluation and treatment. On arrival, trops>27k. Lactate 3.7. white count 37k. No ST elevation on initial EKG however did develop new LBBB on later EKG. Cardiology consulted and spoke with intervential cardiology. Given septic state, renal failure, was not candidate for cath lab.  Imaging suggestive of biliary source of sepsis with cholecytitis present. Not a candidate for lap chole at that time so IR consulted with plans for perc cholecystotomy tube place 11/23 however on repeat imaging, gallbladder distension was resolved so tube placement was deferred.  Past Medical History  Myasthenia Gravis HTN Prostate cancer HLD RAS   Significant Hospital Events   11/20 admitted to [facility] with sepsis 11/21 transferred to [facility] for chest pain and EKG changes  Consults:  Cardiology gen surg IR  Procedures:  11/20 CVC 11/22 Intubation 11/23 central IJ line placement  Significant Diagnostic Tests:  11/20 echo: EF 30-35%. Severely decreased LV function. Mild LVH. 11/21 echo: EF 20-25% with severely decreased LV function. No LVH. Diffuse hypokinesis most prominently of the anteroseptal wall and entire apex  11/20 CT c/a/p:pericholecystic edema. Left renal stone  11/21 abd [place]: thickened gallbladder wall with gallstones/sludge. Suspicious for cholecystitis. 11/23 CT a/p: interval resolution of gallbladder distension. New bilateral pleural effusions and bibasilar consolidation  11/21 CXR: worsened CHF pattern 11/22 CXR: progressively worsened pulm edema. Slight increase in  cardiomegaly 11/23 CXR s/p central line placement: improving bilateral pulm infiltrates.  11/24 CXR: pulm vasc congestion improving. Mild bibasilar airspace disease atelectasis vs infection 11/26 chest x-ray: Minimal bilateral vascular congestion.  Micro Data:  11/20 BC>>NGTD  Antimicrobials:  merrem 11/21>> 01/21/2019 vanc 11/21>> 01/21/2019 01/21/2019 Unasyn>> Interim history/subjective:  No acute distress.  Somewhat confused but hemodynamically stable.  Objective   Blood pressure 105/64, pulse 82, temperature (!) 97 F (36.1 C), temperature source Oral, resp. rate (!) 21, height 5\' 8"  (1.727 m), weight 76.2 kg, SpO2 100 %. CVP:  [3 mmHg] 3 mmHg      Intake/Output Summary (Last 24 hours) at 01/22/2019 0910 Last data filed at 01/22/2019 0900 Gross per 24 hour  Intake 1032.6 ml  Output 2299 ml  Net -1266.4 ml   Filed Weights   01/20/19 0500 01/21/19 0500 01/22/19 0500  Weight: 81 kg 79.6 kg 76.2 kg    Examination: General: Thin elderly male HEENT: No JVD or lymphadenopathy is appreciated Neuro: Aware that he is confused as to place but is aware he is in the hospital and answers questions appropriately CV: Heart sounds are regular PULM: Diminished in bases GI: soft, bsx4 active, nontender was evaluated with recent ultrasound of but no fluid collection amenable to drain placement GU: Amber urine Extremities: warm/dry,  edema  Skin: no rashes or lesions   Resolved Hospital Problem list   none  Assessment & Plan:   Acute respiratory failure acquiring intubation and mechanical ventilation Acute pulmonary edema  likely multifactorial in setting of pulm edema with possible pneumonia. Plan:  Extubated 01/21/2019 Pulmonary toilet Mobilize Maintain negative I&O as tolerated  Septic shock, leukocytosis, unclear etiology, history of gram-negative bacteremia at previous hospitalization Currently on  Unasyn Off vasopressor support  Ischemic cardiac event, newly  depressed ejection fraction 20% Acute on chronic heart failure Acute on chronic systolic heart failure, volume overload, resolving Cute pulmonary edema Trops >27k. New LBBB on EKG. BNP 4100 on admission. 11/21 echo with EF of 20-25% with severe hypokinesis of anteroseptum and apex. Severe Aortic Stenosis.   Will need cath once stable and renal function improves.   Plan  off vasopressor support  Continue to monitor in intensive care unit  Cardiac cath in the future once renal function improves   Thickened gallbladder on ultrasound examination but without significant fluid for drainage placement. P: Advance diet as tolerated Continue to monitor for any signs and symptoms of cholecystitis Currently on Unasyn  AKI. Lab Results  Component Value Date   CREATININE 1.76 (H) 01/22/2019   CREATININE 2.10 (H) 01/21/2019   CREATININE 2.11 (H) 01/21/2019    Plan: Monitor and avoid nephrotoxic agents  Myasthenia Gravis.   Plan:  Currently on stress dose steroids 50 mg of hydrocortisone once daily Continue Mestinon.  Hypokalemia Recent Labs  Lab 01/21/19 0509 01/21/19 1652 01/22/19 0440  K 2.7* 3.4* 3.3*    Plan: Monitor replete as needed  Best practice:  Diet: 01/22/2019 advance diet as tolerated Pain/Anxiety/Delirium protocol (if indicated): Currently on Precedex will discontinue DVT prophylaxis: heparin infusion in the setting of MI Mobility: BR Code Status: Full Family Communication: 01/22/2019 patient updated at bedside Disposition: ICU  App CCT 35 min  Richardson Landry Minor ACNP Acute Care Nurse Practitioner [place] Please consult Amion 01/22/2019, 9:11 AM'   PCCM attending:  83 year old gentleman history of E. coli bacteremia concerned of potential gallbladder source overall unclear etiology however presented with severely elevated troponin, ischemia, NSTEMI lactic acidosis severe aortic stenosis, history of myasthenia gravis.  Multifactorial  shock state intubated for acute hypoxemic respiratory failure secondary to pulmonary edema and septic shock.  Patient was extubated on 01/21/2019.  BP 92/66   Pulse (!) 113   Temp 97.9 F (36.6 C) (Oral)   Resp (!) 32   Ht 5\' 8"  (1.727 m)   Wt 76.2 kg   SpO2 94%   BMI 25.54 kg/m   General: Elderly male, in the intensive care unit somewhat delirious is able to follow commands. HEENT: NCAT, sclera clear Cardiac: Regular rate rhythm, S1-S2 Lungs: Clear to auscultation bilaterally, no crackles no wheeze Abdomen: Soft nontender no pain in the right upper quadrant.  Labs: Reviewed Imaging: Reviewed  Assessment: Acute hypoxemic respiratory failure, now requiring nasal cannula O2 supplementation Acute pulmonary resume, resolving Septic shock, presumed GI source, possible gram-negative history of gram-negative bacteremia, resolved, mixed cardiogenic shock Ischemic heart disease Acute systolic heart failure, ejection fraction 20% Gallbladder thickening no fluid to drain Acute renal failure, resolving History of myasthenia gravis Delirium, acute likely secondary to ICU medical conditions as stated above  Plan: Extubated doing well Unasyn changed to Zosyn due to sodium load, discussed with cardiology Remains in the ICU for close hemodynamic respiratory support  Awaiting cardiology's recommendation possible catheterization Monday Heart failure regimen per heart failure service. Remains off vasopressors As needed Haldol, observe QT  Garner Nash, DO [facility] 01/22/2019 3:02 PM

## 2019-01-22 NOTE — Progress Notes (Signed)
  Speech Language Pathology Treatment: Dysphagia  Patient Details Name: Duane Burns MRN: [id number] DOB: [birth year] Today's Date: 01/22/2019 Time: 0900-0910 SLP Time Calculation (min) (ACUTE ONLY): 10 min  Assessment / Plan / Recommendation Clinical Impression  Pt was agitated upon SLP arrival, being initiation of wrist restraints. SLP provided Max cues to engage pt in PO trials, but he could not be redirected. He said he would drink water, but then would purse his lips and turn his head away from the boluses presented. Attempted to engage him in self-feeding but he just tried to pour the water out. He did take the straw in his mouth x1 but blew into the cup hard, spraying water instead of drinking it. Would continue to allow sips of water when mentation supports this. SLP will f/u for ability to start PO diet.    HPI HPI: 83 yo male who initally presented to [facility] with findings consistent with sepsis. Recent e. Coli bacteremia treated at OSH.  Not a candidate for lap chole at that time so IR consulted with plans for perc cholecystotomy tube place 11/23 however on repeat imaging, gallbladder distension was resolved so tube placement was deferred. Intubated 11/22-11/26. Has a history of myasthenia gravis. MD note states "Does not appear neuromuscularly weak.  He is able to draw good tidal volume and minute ventilation and pressure support breath." Pt had severe dysphagia in 2016.       SLP Plan  Continue with current plan of care       Recommendations  Diet recommendations: Other(comment)(ice chips, small sips of water) Medication Administration: Crushed with puree                Oral Care Recommendations: Oral care BID Follow up Recommendations: (tba) SLP Visit Diagnosis: Dysphagia, oropharyngeal phase (R13.12) Plan: Continue with current plan of care       GO                Venita Sheffield Kinlie Janice 01/22/2019, 11:04 AM  Pollyann Glen, M.A. [place] Acute Art therapist [phone] Office [phone]

## 2019-01-22 NOTE — Progress Notes (Signed)
S: Called by nursing staff stating patient has developed new pruritic rash to posterior buttocks and bilateral posterior thighs since yesterday afternoon. Nursing staff stated that during insertion of Flexi-Seal yesterday patient was noted to have no rash in the area however on further skin assessment and pericare today 11/27 patient was seen to have newly developed rash.  O: Patient is seen urticaria like rash to posterior buttocks tracking down bilateral posterior thighs.  Patient reports rash is pruritic in nature.  Images obtained as below.     A: Wth history of septic shock on admission and continued leukocytosis of unclear etiology antibiotic therapy has been continued including transition to IV Unasyn 11/26.  Given hyponatremia and high sodium content of Unasyn patient was switched to IV Zosyn today 11/27. With recent change in antibiotic therapy and urticaria-like rash suspicion for possible drug interaction to penicillin-like antibiotics as possible cause rash.  P: -Per pharmacy's recommendation we will transition from IV Zosyn to IV cefepime and Flagyl to include anaerobic coverage.   -Cefepime is less likely to cause reaction if penicillin allergy does exist.  -As needed Benadryl for itching.   -Frequent turning and pericare.   -Frequent skin assessments.

## 2019-01-22 NOTE — Progress Notes (Signed)
Progress Note  Patient Name: Duane Burns Date of Encounter: 01/22/2019  Primary Cardiologist: Kate Sable, MD   Subjective   Extubated 11/26. Very agitated overnight. Unable to be redirected. Now on precedex. More relaxed but remains confused.    Remains on NE at 3. Co-ox 59%   Creatinine continues to improve but sodium up to 152.   Inpatient Medications    Scheduled Meds: . aspirin  81 mg Oral Daily  . atorvastatin  80 mg Oral q1800  . chlorhexidine gluconate (MEDLINE KIT)  15 mL Mouth Rinse BID  . Chlorhexidine Gluconate Cloth  6 each Topical Daily  . cholecalciferol  1,000 Units Oral Daily  . clopidogrel  75 mg Oral Daily  . feeding supplement (PRO-STAT SUGAR FREE 64)  30 mL Per Tube BID  . Gerhardt's butt cream   Topical BID  . hydrocortisone sod succinate (SOLU-CORTEF) inj  50 mg Intravenous Daily  . mouth rinse  15 mL Mouth Rinse 10 times per day  . multivitamin with minerals  1 tablet Oral Daily  . potassium chloride  20 mEq Oral Daily  . pyridostigmine  60 mg Oral TID  . senna-docusate  2 tablet Oral BID  . sodium chloride flush  3 mL Intravenous Q12H  . spironolactone  12.5 mg Oral Daily   Continuous Infusions: . sodium chloride Stopped (01/21/19 1443)  . ampicillin-sulbactam (UNASYN) IV 1.5 g (01/22/19 0828)  . dexmedetomidine (PRECEDEX) IV infusion Stopped (01/22/19 0226)  . famotidine (PEPCID) IV 20 mg (01/21/19 2259)  . heparin 500 Units/hr (01/22/19 0600)  . norepinephrine (LEVOPHED) Adult infusion 3 mcg/min (01/22/19 0600)  . potassium chloride 10 mEq (01/22/19 0827)   PRN Meds: sodium chloride, acetaminophen **OR** acetaminophen, bisacodyl, fentaNYL (SUBLIMAZE) injection, guaiFENesin, haloperidol lactate, levalbuterol, midazolam, ondansetron **OR** ondansetron (ZOFRAN) IV, polyethylene glycol, sennosides, sodium chloride flush, traZODone   Vital Signs    Vitals:   01/22/19 0500 01/22/19 0600 01/22/19 0700 01/22/19 0800  BP: (!) 125/56  (!) 157/86 (!) 141/69 105/64  Pulse: (!) 42 (!) 59 (!) 107 82  Resp: (!) 28 (!) 30 (!) 28 (!) 21  Temp:   (!) 97 F (36.1 C)   TempSrc:   Oral   SpO2: 91% 100% 98% 100%  Weight: 76.2 kg     Height:        Intake/Output Summary (Last 24 hours) at 01/22/2019 0906 Last data filed at 01/22/2019 0600 Gross per 24 hour  Intake 835.97 ml  Output 2299 ml  Net -1463.03 ml   Filed Weights   01/20/19 0500 01/21/19 0500 01/22/19 0500  Weight: 81 kg 79.6 kg 76.2 kg    Telemetry    Sinus/MAT  Frequent PACs rates 90-140 depending on agitation level - Personally reviewed  Physical Exam   General:  In bed. Calm but confused No resp difficulty HEENT: normal Neck: supple. no JVD. Carotids 2+ bilat; no bruits. No lymphadenopathy or thryomegaly appreciated. Cor: PMI nondisplaced. Irregular rate & rhythm. No rubs, gallops or murmurs. Lungs: clear Abdomen: soft, nontender, nondistended. No hepatosplenomegaly. No bruits or masses. Good bowel sounds. Extremities: no cyanosis, clubbing, rash, edema Neuro: calm but confused no focal deficits    Labs    Chemistry Recent Labs  Lab 01/16/19 1024  01/17/19 0229  01/19/19 0345  01/21/19 0400 01/21/19 0509 01/21/19 1652 01/22/19 0440  NA 142   < > 139   < > 144   < > 146* 147* 148* 152*  K 4.8   < >  5.0   < > 3.4*   < > 3.0* 2.7* 3.4* 3.3*  CL 110  --  110   < > 109   < > 107  --  109 112*  CO2 18*  --  18*   < > 22   < > 26  --  23 26  GLUCOSE 136*  --  132*   < > 84   < > 161*  --  166* 92  BUN 32*  --  44*   < > 57*   < > 77*  --  79* 74*  CREATININE 2.44*  --  2.98*   < > 2.87*   < > 2.11*  --  2.10* 1.76*  CALCIUM 7.6*  --  7.5*   < > 7.9*   < > 8.0*  --  8.2* 8.4*  PROT 5.6*  --  5.5*  --  5.1*  --   --   --   --   --   ALBUMIN 2.9*  --  2.6*  --  2.4*  --   --   --   --   --   AST 544*  --  410*  --  78*  --   --   --   --   --   ALT 183*  --  159*  --  88*  --   --   --   --   --   ALKPHOS 65  --  72  --  82  --   --   --   --    --   BILITOT 0.8  --  1.0  --  2.5*  --   --   --   --   --   GFRNONAA 24*  --  19*   < > 20*   < > 28*  --  29* 35*  GFRAA 28*  --  22*   < > 23*   < > 33*  --  33* 41*  ANIONGAP 14  --  11   < > 13   < > 13  --  16* 14   < > = values in this interval not displayed.     Hematology Recent Labs  Lab 01/20/19 0408 01/21/19 0400 01/21/19 0509 01/22/19 0440  WBC 23.2* 29.9*  --  31.3*  RBC 3.54* 3.69*  --  3.84*  HGB 10.4* 10.8* 10.5* 11.3*  HCT 32.1* 33.5* 31.0* 35.2*  MCV 90.7 90.8  --  91.7  MCH 29.4 29.3  --  29.4  MCHC 32.4 32.2  --  32.1  RDW 14.1 14.1  --  14.2  PLT 105* 148*  --  158    Cardiac EnzymesNo results for input(s): TROPONINI in the last 168 hours. No results for input(s): TROPIPOC in the last 168 hours.   BNP Recent Labs  Lab 01/16/19 1024  BNP 4,082.7*     DDimer No results for input(s): DDIMER in the last 168 hours.   Radiology    Dg Chest Port 1 View  Result Date: 01/21/2019 CLINICAL DATA:  Postop evaluation.  Endotracheal tube assessment. EXAM: PORTABLE CHEST 1 VIEW COMPARISON:  01/20/2019 FINDINGS: Endotracheal tube has tip 5.1 cm above the carina. Enteric tube courses into the stomach and off the film as tip is not visualized. Right IJ central venous catheter has tip at the cavoatrial junction unchanged. Lungs are somewhat hypoinflated without evidence of pneumothorax. No significant effusion. Mild hazy opacification over  the right base unchanged. Subtle hazy prominence of the perihilar markings with interval improvement. Cardiomediastinal silhouette and remainder of the exam is unchanged. IMPRESSION: 1. Interval improvement minimal vascular congestion. Stable minimal hazy opacification right base. 2.  Tubes and lines as described. Electronically Signed   By: Marin Olp M.D.   On: 01/21/2019 09:10    Cardiac Studies   Echo 11/21  1. Left ventricular ejection fraction, by visual estimation, is 20 to 25%. The left ventricle has severely decreased  function. There is no left ventricular hypertrophy.  2. Diffuse hypokinesis with most prominent severe hypokinesis of the anteroseptal wall and the entire apex.  3. Global right ventricle was not assessed.The right ventricular size is not assessed. Right vetricular wall thickness was not assessed.  4. Left atrial size was not assessed.  5. Right atrial size was not assessed.  6. The pericardium was not assessed.  7. The mitral valve was not assessed. not assessed mitral valve regurgitation.  8. The tricuspid valve is not assessed. Tricuspid valve regurgitation not assessed.  9. Aortic valve regurgitation Not assessed. 10. The aortic valve was not assessed. Aortic valve regurgitation Not assessed. 11. The pulmonic valve was not assessed. Pulmonic valve regurgitation Not assessed. 12. Not assessed. 13. The interatrial septum was not assessed.  ECHO 11/20 12/2018 LEFT VENTRICLE PLAX 2D LVIDd: 4.17 cm LVIDs: 2.50 cm LV PW: 1.26 cm LV IVS: 1.30 cm LVOT diam: 1.80 cm LV SV: 55 ml LV SV Index: 26.59 LVOT Area: 2.54 cm  06/2018 +------------------+------------++ AORTIC VALVE   +------------------+------------++ AV Area (Vmax): 0.88 cm  +------------------+------------++ AV Area (Vmean): 0.89 cm  +------------------+------------++ AV Area (VTI): 0.90 cm  +------------------+------------++ AV Vmax: 372.81 cm/s  +------------------+------------++ AV Vmean: 255.916 cm/s +------------------+------------++ AV VTI: 0.923 m  +------------------+------------++ AV Peak Grad: 55.6 mmHg  +------------------+------------++ AV Mean Grad: 30.9 mmHg  +------------------+------------++ LVOT Vmax: 86.15 cm/s  +------------------+------------++ LVOT Vmean: 60.100 cm/s  +------------------+------------++ LVOT  VTI: 0.220 m  +------------------+------------++ LVOT/AV VTI ratio:0.24  +------------------+------------++ AR PHT: 779 msec  +------------------+------------++    Patient Profile     Duane Burns is a 83yo male with PMH HTN, moderate to severe AS, myasthenia gravis, HLD, recent e. Coli bacteremia treated at [facility] (10/19-10/29) presenting with sepsis secondary to acute cholecystitis with acute HF and severe aortic stenosis.   Assessment & Plan    1. Shock - combined sepsis/cardiogenic  - PCT very high (67!) - Sepsis now resolving. Initially felt to be cholecystitis but now felt less likely - Meropenem switched to Unasyn on 11/26. (vanc stopped) Cx negative  - Had e. coli sepsis recently. So this is recurrent. Source unclear (GI vs GU) -> CCM managing - Remains on NE for BP and cardiac support. Co-ox 59%  - WBC going back up. May be due to stress-dose steroids.  - Watch sodium load with Unasyn -> change to zosyn   2. Acute Systolic HF -> cardiogenic shock - Echo newly down to 20-25% with severe AS from 60-65% with mod-severe AS on 06/2018. - hs Trop > 27K  - suspect combination of ischemic and septic CM - Volume status much improved. CVP 2-3 Hold lasix today - co-ox 59% on NE 3 - continuing NE with goal SBP > 95 and Co-ox > 55% - R&L cath once renal function recovers fully and condition permits - likely Monday if delirium improved - Hopefully can start HF meds soon as BP and renal function permit - Continue spiro today  - No ACE/ARB/b-blocker yet with  hypotension and AKI  3. Acute hypoxic respiratory failure - due to HF and sepsis - extubated 11/26   4. Severe Aortic Stenosis - Worsened AS since May of this year. Will need R&L heart cath after he is stable and has improvement in renal function - eventual evaluation for TAVR.   5. AKI - due to shock/ATN - baseline creatinine 1.6 - Improving with hemodynamic support  Creatinine down to 1.7   6. Myasthenia gravis  - on stress-dose steroids - watch for precipitation of MG crisis    7. PAF - had AF overnight on 11/23 - now in NSR. Has not recurred  - Will stop IV heparin. Start DVT prophylaxis LMWH  8. New LBBB - suspect ischemic  9. Hypokalemia - K up to 3.3. Need to continue to supp .  10. Hypernatremia - will ned to increase free water. Hold lasix   11. Acute delirium  - likely steroids/metabolic +/- sundowning - CCM managing  For questions or updates, please contact [place] Please consult [email or web address] for contact info under Cardiology/STEMI.   CRITICAL CARE Performed by: Glori Bickers  Total critical care time: 35 minutes  Critical care time was exclusive of separately billable procedures and treating other patients.  Critical care was necessary to treat or prevent imminent or life-threatening deterioration.  Critical care was time spent personally by me (independent of midlevel providers or residents) on the following activities: development of treatment plan with patient and/or surrogate as well as nursing, discussions with consultants, evaluation of patient's response to treatment, examination of patient, obtaining history from patient or surrogate, ordering and performing treatments and interventions, ordering and review of laboratory studies, ordering and review of radiographic studies, pulse oximetry and re-evaluation of patient's condition.      Signed, Glori Bickers, MD  01/22/2019, 9:06 AM

## 2019-01-23 ENCOUNTER — Inpatient Hospital Stay (HOSPITAL_COMMUNITY): Payer: Medicare HMO

## 2019-01-23 DIAGNOSIS — R6520 Severe sepsis without septic shock: Secondary | ICD-10-CM

## 2019-01-23 LAB — COOXEMETRY PANEL
Carboxyhemoglobin: 0.8 % (ref 0.5–1.5)
Methemoglobin: 0.8 % (ref 0.0–1.5)
O2 Saturation: 56.5 %
Total hemoglobin: 14.1 g/dL (ref 12.0–16.0)

## 2019-01-23 LAB — CBC WITH DIFFERENTIAL/PLATELET
Abs Immature Granulocytes: 0.58 10*3/uL — ABNORMAL HIGH (ref 0.00–0.07)
Basophils Absolute: 0 10*3/uL (ref 0.0–0.1)
Basophils Relative: 0 %
Eosinophils Absolute: 0 10*3/uL (ref 0.0–0.5)
Eosinophils Relative: 0 %
HCT: 35.4 % — ABNORMAL LOW (ref 39.0–52.0)
Hemoglobin: 11.5 g/dL — ABNORMAL LOW (ref 13.0–17.0)
Immature Granulocytes: 2 %
Lymphocytes Relative: 4 %
Lymphs Abs: 1.1 10*3/uL (ref 0.7–4.0)
MCH: 29.3 pg (ref 26.0–34.0)
MCHC: 32.5 g/dL (ref 30.0–36.0)
MCV: 90.3 fL (ref 80.0–100.0)
Monocytes Absolute: 1.1 10*3/uL — ABNORMAL HIGH (ref 0.1–1.0)
Monocytes Relative: 4 %
Neutro Abs: 26.8 10*3/uL — ABNORMAL HIGH (ref 1.7–7.7)
Neutrophils Relative %: 90 %
Platelets: 150 10*3/uL (ref 150–400)
RBC: 3.92 MIL/uL — ABNORMAL LOW (ref 4.22–5.81)
RDW: 14.3 % (ref 11.5–15.5)
WBC: 29.5 10*3/uL — ABNORMAL HIGH (ref 4.0–10.5)
nRBC: 0 % (ref 0.0–0.2)

## 2019-01-23 LAB — GLUCOSE, CAPILLARY
Glucose-Capillary: 129 mg/dL — ABNORMAL HIGH (ref 70–99)
Glucose-Capillary: 141 mg/dL — ABNORMAL HIGH (ref 70–99)
Glucose-Capillary: 145 mg/dL — ABNORMAL HIGH (ref 70–99)
Glucose-Capillary: 162 mg/dL — ABNORMAL HIGH (ref 70–99)
Glucose-Capillary: 87 mg/dL (ref 70–99)
Glucose-Capillary: 89 mg/dL (ref 70–99)
Glucose-Capillary: 89 mg/dL (ref 70–99)

## 2019-01-23 LAB — BASIC METABOLIC PANEL
Anion gap: 15 (ref 5–15)
BUN: 72 mg/dL — ABNORMAL HIGH (ref 8–23)
CO2: 24 mmol/L (ref 22–32)
Calcium: 8.2 mg/dL — ABNORMAL LOW (ref 8.9–10.3)
Chloride: 114 mmol/L — ABNORMAL HIGH (ref 98–111)
Creatinine, Ser: 2.07 mg/dL — ABNORMAL HIGH (ref 0.61–1.24)
GFR calc Af Amer: 34 mL/min — ABNORMAL LOW (ref >60)
GFR calc non Af Amer: 29 mL/min — ABNORMAL LOW (ref >60)
Glucose, Bld: 89 mg/dL (ref 70–99)
Potassium: 3.3 mmol/L — ABNORMAL LOW (ref 3.5–5.1)
Sodium: 153 mmol/L — ABNORMAL HIGH (ref 135–145)

## 2019-01-23 LAB — MAGNESIUM: Magnesium: 2.1 mg/dL (ref 1.7–2.4)

## 2019-01-23 LAB — PHOSPHORUS: Phosphorus: 3.8 mg/dL (ref 2.5–4.6)

## 2019-01-23 MED ORDER — SODIUM CHLORIDE 0.9 % IV SOLN
2.0000 g | INTRAVENOUS | Status: AC
  Administered 2019-01-24 – 2019-01-25 (×2): 2 g via INTRAVENOUS
  Filled 2019-01-23 (×2): qty 2

## 2019-01-23 MED ORDER — POTASSIUM CL IN DEXTROSE 5% 20 MEQ/L IV SOLN
20.0000 meq | INTRAVENOUS | Status: AC
  Administered 2019-01-23: 20 meq via INTRAVENOUS
  Filled 2019-01-23: qty 1000

## 2019-01-23 MED ORDER — DIPHENHYDRAMINE HCL 50 MG/ML IJ SOLN
12.5000 mg | Freq: Four times a day (QID) | INTRAMUSCULAR | Status: DC | PRN
  Administered 2019-01-23 – 2019-01-27 (×5): 12.5 mg via INTRAVENOUS
  Filled 2019-01-23 (×6): qty 1

## 2019-01-23 NOTE — Progress Notes (Signed)
Coughing more again, this time when eating, mechanical soft diet, nurse staying with patient through whole meal, head of bed up 90 degrees, cut patient off as he kept coughing. Did fine with liquids, coughing after meats chopped and ice cream RR picking up to 40's RT called to evaluate. Continues to sound same clear diminished. Marland Kitchen

## 2019-01-23 NOTE — Progress Notes (Signed)
C/o more dyspnea, back to bed with stedy.  RR back to bed improved, 28-32, o2 placed on 2 l for comfort. Sounds same, clear diminished.  ST with PVC, PAC. sa02 99-100% BP 103/56. HOB up for comfort turned to right side. Buttock area looks worse breaking down due to liquid stool leaking out around rectal tube. Cleaned several times today. Gerhardt goo applied.

## 2019-01-23 NOTE — Evaluation (Signed)
Occupational Therapy Evaluation Patient Details Name: Duane Burns MRN: [id number] DOB: 01-14-1936 Today's Date: 01/23/2019    History of Present Illness Pt adm with sepsis to [facility]. Developed chest pain and transferred to [facility]. Pt with possible cholecystitis but resolved. Pt intubated 11/22-11/26PMH - myasthenia gravis, rt THR, back surgery, lt foot drop, HTN, prostate CA   Clinical Impression   PTA patient reports independent with mobility and ADLs, although poor historian at this time due to impaired cognition. Admitted for above and limited by problem list below, including generalized weakness, impaired balance and decreased activity tolerance. Cognitively, patient oriented to name and place only; follows 1 step commands with increased time and is impulsive.  Patient requires min assist for UB ADLs, max-total assist for LB ADLs, and mod assist +2 for sit to stand transfers (using stedy for pivot to recliner today).  Patient will benefit from continued OT services while admitted and after dc at SNF level in order to optimize independence and return to PLOF.      Follow Up Recommendations  SNF;Supervision/Assistance - 24 hour    Equipment Recommendations  3 in 1 bedside commode    Recommendations for Other Services       Precautions / Restrictions Precautions Precautions: Fall Required Braces or Orthoses: Other Brace Other Brace: uses Lt AFO which doesn't appear to be in pt's room Restrictions Weight Bearing Restrictions: No      Mobility Bed Mobility Overal bed mobility: Needs Assistance Bed Mobility: Supine to Sit     Supine to sit: Mod assist;HOB elevated     General bed mobility comments: Assist to bring legs off of bed, elevate trunk into sitting and bring hips to EOB.  Transfers Overall transfer level: Needs assistance Equipment used: 2 person hand held assist;Ambulation equipment used Transfers: Sit to/from Stand Sit to Stand: +2 physical assistance;Mod  assist         General transfer comment: Assist to bring hips up and for balance. With hand held assist pt with significant flexed posture. With Stedy pt able to achieve more upright with incr time and verbal, tactile cues. Used Stedy for bed to chair.     Balance Overall balance assessment: Needs assistance Sitting-balance support: Bilateral upper extremity supported;Feet supported;Feet unsupported Sitting balance-Leahy Scale: Poor Sitting balance - Comments: Pt sat EOB x 6-7 minutes with min guard to min assist   Standing balance support: Bilateral upper extremity supported;During functional activity Standing balance-Leahy Scale: Poor Standing balance comment: Stood with Stedy with +2 min for static standing. Stood with HHA with +2 mod assist. Stood x 15-20 sec                           ADL either performed or assessed with clinical judgement   ADL Overall ADL's : Needs assistance/impaired     Grooming: Minimal assistance;Sitting   Upper Body Bathing: Minimal assistance;Sitting   Lower Body Bathing: Maximal assistance;+2 for physical assistance;Sit to/from stand Lower Body Bathing Details (indicate cue type and reason): decreased reach to feet, +2 mod sit to stand  Upper Body Dressing : Minimal assistance;Sitting   Lower Body Dressing: +2 for physical assistance;Total assistance;Sit to/from stand Lower Body Dressing Details (indicate cue type and reason): decreased reach to feet, +2 mod sit to stand  Toilet Transfer: Moderate assistance;+2 for physical assistance;Total assistance Toilet Transfer Details (indicate cue type and reason): using stedy mod assist +2 sit to stand, stedy to pivot  Functional mobility during ADLs: Moderate assistance;+2 for physical assistance General ADL Comments: pt limited by impaired cognition, impaired balance, decreased activity tolerance and generalized weakness     Vision   Vision Assessment?: No apparent visual  deficits     Perception     Praxis      Pertinent Vitals/Pain Pain Assessment: Faces Faces Pain Scale: No hurt     Hand Dominance Right   Extremity/Trunk Assessment Upper Extremity Assessment Upper Extremity Assessment: Generalized weakness   Lower Extremity Assessment Lower Extremity Assessment: Defer to PT evaluation   Cervical / Trunk Assessment Cervical / Trunk Assessment: Normal   Communication Communication Communication: No difficulties   Cognition Arousal/Alertness: Awake/alert Behavior During Therapy: Impulsive Overall Cognitive Status: Impaired/Different from baseline Area of Impairment: Orientation;Attention;Memory;Following commands;Safety/judgement;Awareness;Problem solving                 Orientation Level: Disoriented to;Time;Situation(unable to state correct birthday) Current Attention Level: Sustained Memory: Decreased recall of precautions;Decreased short-term memory Following Commands: Follows one step commands consistently Safety/Judgement: Decreased awareness of safety;Decreased awareness of deficits Awareness: Intellectual Problem Solving: Slow processing;Requires verbal cues;Difficulty sequencing General Comments: patient only oriented to name and place (hospital); impulsive and follows 1 step commands with increased time     General Comments  HR ranges from 100s to high 130s; RR 95% on RA; RR high 30s; BP pre 125/55 and post 105/42    Exercises     Shoulder Instructions      Home Living Family/patient expects to be discharged to:: Private residence Living Arrangements: Spouse/significant other Available Help at Discharge: Family Type of Home: House Home Access: Stairs to enter Technical brewer of Steps: 5-6 Entrance Stairs-Rails: Right;Left;Can reach both Home Layout: One level     Bathroom Shower/Tub: Teacher, early years/pre: Handicapped height Bathroom Accessibility: Yes   Home Equipment: Environmental consultant - 2  wheels;Other (comment)(lt AFO)   Additional Comments: taken from previous admission       Prior Functioning/Environment          Comments: poor historian due to confusion, but reports independent with mobility, ADLs, and driving         OT Problem List: Decreased strength;Decreased activity tolerance;Impaired balance (sitting and/or standing);Decreased cognition;Decreased safety awareness;Decreased knowledge of use of DME or AE;Decreased knowledge of precautions;Cardiopulmonary status limiting activity      OT Treatment/Interventions: Self-care/ADL training;Energy conservation;DME and/or AE instruction;Therapeutic activities;Cognitive remediation/compensation;Patient/family education;Balance training    OT Goals(Current goals can be found in the care plan section) Acute Rehab OT Goals Patient Stated Goal: none stated OT Goal Formulation: Patient unable to participate in goal setting Time For Goal Achievement: 02/06/19 Potential to Achieve Goals: Good  OT Frequency: Min 2X/week   Barriers to D/C:            Co-evaluation PT/OT/SLP Co-Evaluation/Treatment: Yes Reason for Co-Treatment: For patient/therapist safety;To address functional/ADL transfers PT goals addressed during session: Mobility/safety with mobility;Balance OT goals addressed during session: ADL's and self-care      AM-PAC OT "6 Clicks" Daily Activity     Outcome Measure Help from another person eating meals?: A Little Help from another person taking care of personal grooming?: A Little Help from another person toileting, which includes using toliet, bedpan, or urinal?: Total Help from another person bathing (including washing, rinsing, drying)?: A Lot Help from another person to put on and taking off regular upper body clothing?: A Little Help from another person to put on and taking off regular lower body clothing?: Total 6  Click Score: 13   End of Session Equipment Utilized During Treatment: Gait  belt Nurse Communication: Mobility status  Activity Tolerance: Patient tolerated treatment well Patient left: in chair;with call bell/phone within reach;with chair alarm set;with nursing/sitter in room  OT Visit Diagnosis: Other abnormalities of gait and mobility (R26.89);Muscle weakness (generalized) (M62.81);Other symptoms and signs involving cognitive function                Time: [id number] OT Time Calculation (min): 27 min Charges:  OT General Charges $OT Visit: 1 Visit OT Evaluation $OT Eval Moderate Complexity: [place], [place] Acute Rehabilitation Services Pager [phone] Office [phone]   Delight Stare 01/23/2019, 11:05 AM

## 2019-01-23 NOTE — Progress Notes (Signed)
Starting to cough, not after anything in particular PO. ate lunch well, Lungs sound same, clear upper siminished bil lower. Feels tickle in throat. Medicated with benedryl IV will see if this improves. Taking sips of water and tolerating well. Swallowing well, nurses at bedside with liquids. Speech in this morning feeding patient breakfast. Does hold some things, espciallyl dry items in mouth, suggested shopped foods. Ordered mechanical soft DYS 3 diet based on recommendations.

## 2019-01-23 NOTE — Progress Notes (Signed)
Pharmacy Antibiotic Note  Duane Burns is a 83 y.o. male admitted on 12/29/2018 with sepsis of unclear source. Patient was on vancomycin and meropenem which was switched yesterday to Unasyn for 5 more days. Given hypernatremia and high sodium content of Unasyn, switched to Zosyn. Pt developed pruritic rash so changed to cefepime plus metronidazole. Renal function worsening today.   Plan: Adjust cefepime to 2g IV q24h Continue metronidazole 500mg  IV q8h Watch renal function closely    Height: 5\' 8"  (172.7 cm) Weight: 173 lb 1 oz (78.5 kg) IBW/kg (Calculated) : 68.4  Temp (24hrs), Avg:98.5 F (36.9 C), Min:97.8 F (36.6 C), Max:99.3 F (37.4 C)  Recent Labs  Lab 01/16/19 1024 01/17/19 0229 01/17/19 0821  01/17/19 2150 01/18/19 0013  01/19/19 0345 01/20/19 0408 01/21/19 0400 01/21/19 1652 01/22/19 0440 01/23/19 0412  WBC 57.0* 52.8*  --   --   --   --    < > 24.4* 23.2* 29.9*  --  31.3* 29.5*  CREATININE 2.44* 2.98*  --    < >  --   --    < > 2.87* 2.23* 2.11* 2.10* 1.76* 2.07*  LATICACIDVEN 4.7* 3.5*  --   --  2.3* 1.7  --   --   --   --   --   --   --   VANCORANDOM  --   --  16  --   --   --   --   --   --   --   --   --   --    < > = values in this interval not displayed.    Estimated Creatinine Clearance: 27.1 mL/min (A) (by C-G formula based on SCr of 2.07 mg/dL (H)).    Allergies  Allergen Reactions  . Penicillins Rash    Noted after change from Unasyn to Zosyn 01/22/19    Antimicrobials this admission:  11/20 Vanc >> 11/25 11/20 Meropenem >>11/26 11/26 Unasyn >> 11/27 11/27 Zosyn >> 11/27 11/27 Cefepime >> 11/27 Flagyl >>  Dose adjustments this admission:  11/22 Vancomycin random level of 16  Microbiology results:  11/20 COVID: neg 11/20 BCx: neg 11/20 UCx: neg 11/20 MRSA PCR:neg 11/22 bldx2: Duane Burns, PharmD, BCPS Clinical Pharmacist [phone] Please check AMION for all [place] numbers 01/23/2019

## 2019-01-23 NOTE — Progress Notes (Signed)
Progress Note  Patient Name: Duane Burns Date of Encounter: 01/23/2019  Primary Cardiologist: Kate Sable, MD   Subjective   Extubated 11/26. Remains confused. Off NE. Co-ox 52%-> 57%   Developed drug rash after switching unasyn to zosyn. Now back on cefipime.   Sodium continues to rise. Now 153. K low. Po intake poor. Denies CP or SOB.   Inpatient Medications    Scheduled Meds: . aspirin  81 mg Oral Daily  . atorvastatin  80 mg Oral q1800  . chlorhexidine gluconate (MEDLINE KIT)  15 mL Mouth Rinse BID  . Chlorhexidine Gluconate Cloth  6 each Topical Daily  . cholecalciferol  1,000 Units Oral Daily  . clopidogrel  75 mg Oral Daily  . feeding supplement (PRO-STAT SUGAR FREE 64)  30 mL Per Tube BID  . Gerhardt's butt cream   Topical BID  . heparin injection (subcutaneous)  5,000 Units Subcutaneous Q8H  . mouth rinse  15 mL Mouth Rinse 10 times per day  . multivitamin with minerals  1 tablet Oral Daily  . potassium chloride  20 mEq Oral Daily  . predniSONE  10 mg Oral Q breakfast  . pyridostigmine  60 mg Oral TID  . senna-docusate  2 tablet Oral BID  . sodium chloride flush  10-40 mL Intracatheter Q12H  . sodium chloride flush  3 mL Intravenous Q12H  . spironolactone  12.5 mg Oral Daily   Continuous Infusions: . sodium chloride Stopped (01/21/19 1443)  . ceFEPime (MAXIPIME) IV Stopped (01/22/19 2144)  . famotidine (PEPCID) IV 20 mL/hr at 01/22/19 2225  . metronidazole 500 mg (01/23/19 0559)   PRN Meds: sodium chloride, acetaminophen **OR** acetaminophen, bisacodyl, fentaNYL (SUBLIMAZE) injection, guaiFENesin, haloperidol lactate, levalbuterol, midazolam, ondansetron **OR** ondansetron (ZOFRAN) IV, polyethylene glycol, sennosides, sodium chloride flush, sodium chloride flush, traZODone   Vital Signs    Vitals:   01/23/19 0500 01/23/19 0600 01/23/19 0700 01/23/19 0740  BP: 116/65 (!) 107/52 (!) 106/58   Pulse: (!) 110 69 (!) 102   Resp: (!) 33 (!) 30 (!)  33   Temp:    99.3 F (37.4 C)  TempSrc:    Oral  SpO2: 99% 98% 99%   Weight: 78.5 kg     Height:        Intake/Output Summary (Last 24 hours) at 01/23/2019 0755 Last data filed at 01/23/2019 0600 Gross per 24 hour  Intake 524.53 ml  Output 1310 ml  Net -785.47 ml   Filed Weights   01/21/19 0500 01/22/19 0500 01/23/19 0500  Weight: 79.6 kg 76.2 kg 78.5 kg    Telemetry    Sinus tach Frequent PVCs 100-110 Personally reviewed  Physical Exam   General:  Elderly frail mildly confused No resp difficulty HEENT: normal Neck: supple. no JVD. Carotids 2+ bilat; no bruits. No lymphadenopathy or thryomegaly appreciated. Cor: PMI nondisplaced. Regular tachy 2/6 AS Lungs: clear Abdomen: soft, nontender, nondistended. No hepatosplenomegaly. No bruits or masses. Good bowel sounds. Extremities: no cyanosis, clubbing, rash, edema + drug rash Neuro: alert coversant but mildly confused    Labs    Chemistry Recent Labs  Lab 01/16/19 1024  01/17/19 0229  01/19/19 0345  01/21/19 1652 01/22/19 0440 01/23/19 0412  NA 142   < > 139   < > 144   < > 148* 152* 153*  K 4.8   < > 5.0   < > 3.4*   < > 3.4* 3.3* 3.3*  CL 110  --  110   < >  109   < > 109 112* 114*  CO2 18*  --  18*   < > 22   < > _0 GLUCOSE 136*  --  132*   < > 84   < > 166* 92 89  BUN 32*  --  44*   < > 57*   < > 79* 74* 72*  CREATININE 2.44*  --  2.98*   < > 2.87*   < > 2.10* 1.76* 2.07*  CALCIUM 7.6*  --  7.5*   < > 7.9*   < > 8.2* 8.4* 8.2*  PROT 5.6*  --  5.5*  --  5.1*  --   --   --   --   ALBUMIN 2.9*  --  2.6*  --  2.4*  --   --   --   --   AST 544*  --  410*  --  78*  --   --   --   --   ALT 183*  --  159*  --  88*  --   --   --   --   ALKPHOS 65  --  72  --  82  --   --   --   --   BILITOT 0.8  --  1.0  --  2.5*  --   --   --   --   GFRNONAA 24*  --  19*   < > 20*   < > 29* 35* 29*  GFRAA 28*  --  22*   < > 23*   < > 33* 41* 34*  ANIONGAP 14  --  11   < > 13   < > 16* 14 15   < > = values in this  interval not displayed.     Hematology Recent Labs  Lab 01/21/19 0400 01/21/19 0509 01/22/19 0440 01/23/19 0412  WBC 29.9*  --  31.3* 29.5*  RBC 3.69*  --  3.84* 3.92*  HGB 10.8* 10.5* 11.3* 11.5*  HCT 33.5* 31.0* 35.2* 35.4*  MCV 90.8  --  91.7 90.3  MCH 29.3  --  29.4 29.3  MCHC 32.2  --  32.1 32.5  RDW 14.1  --  14.2 14.3  PLT 148*  --  158 150    Cardiac EnzymesNo results for input(s): TROPONINI in the last 168 hours. No results for input(s): TROPIPOC in the last 168 hours.   BNP Recent Labs  Lab 01/16/19 1024  BNP 4,082.7*     DDimer No results for input(s): DDIMER in the last 168 hours.   Radiology    No results found.  Cardiac Studies   Echo 11/21  1. Left ventricular ejection fraction, by visual estimation, is 20 to 25%. The left ventricle has severely decreased function. There is no left ventricular hypertrophy.  2. Diffuse hypokinesis with most prominent severe hypokinesis of the anteroseptal wall and the entire apex.  3. Global right ventricle was not assessed.The right ventricular size is not assessed. Right vetricular wall thickness was not assessed.  4. Left atrial size was not assessed.  5. Right atrial size was not assessed.  6. The pericardium was not assessed.  7. The mitral valve was not assessed. not assessed mitral valve regurgitation.  8. The tricuspid valve is not assessed. Tricuspid valve regurgitation not assessed.  9. Aortic valve regurgitation Not assessed. 10. The aortic valve was not assessed. Aortic valve regurgitation Not assessed. 11. The pulmonic valve was not  assessed. Pulmonic valve regurgitation Not assessed. 12. Not assessed. 13. The interatrial septum was not assessed.  ECHO 11/20 12/2018 LEFT VENTRICLE PLAX 2D LVIDd: 4.17 cm LVIDs: 2.50 cm LV PW: 1.26 cm LV IVS: 1.30 cm LVOT diam: 1.80 cm LV SV: 55 ml LV SV Index: 26.59 LVOT Area: 2.54 cm  06/2018  +------------------+------------++ AORTIC VALVE   +------------------+------------++ AV Area (Vmax): 0.88 cm  +------------------+------------++ AV Area (Vmean): 0.89 cm  +------------------+------------++ AV Area (VTI): 0.90 cm  +------------------+------------++ AV Vmax: 372.81 cm/s  +------------------+------------++ AV Vmean: 255.916 cm/s +------------------+------------++ AV VTI: 0.923 m  +------------------+------------++ AV Peak Grad: 55.6 mmHg  +------------------+------------++ AV Mean Grad: 30.9 mmHg  +------------------+------------++ LVOT Vmax: 86.15 cm/s  +------------------+------------++ LVOT Vmean: 60.100 cm/s  +------------------+------------++ LVOT VTI: 0.220 m  +------------------+------------++ LVOT/AV VTI ratio:0.24  +------------------+------------++ AR PHT: 779 msec  +------------------+------------++    Patient Profile     Duane Burns is a 83yo male with PMH HTN, moderate to severe AS, myasthenia gravis, HLD, recent e. Coli bacteremia treated at [facility] (10/19-10/29) presenting with sepsis secondary to acute cholecystitis with acute HF and severe aortic stenosis.   Assessment & Plan    1. Shock - combined sepsis/cardiogenic  - PCT very high (67!) - Sepsis now resolved. Initially felt to be cholecystitis but now felt less likely - Meropenem switched to Unasyn on 11/26. (vanc stopped). Switched to zosyn deu to salt load. Developed rash. (likely PCN related) Now back on cefipime. Cx negative  - Had e. coli sepsis recently. So this is recurrent. Source unclear (GI vs GU) -> CCM managing - NE off 11/26 BP ok. Co-ox 57%. With low CVP and rising serum sodium may benefit from low-dose IVF will giv [id number] at 50 for 12 hours - WBC falling slowly with wean of steroids.   2.  Acute Systolic HF -> cardiogenic shock - Echo newly down to 20-25% with severe AS from 60-65% with mod-severe AS on 06/2018. - hs Trop > 27K  - suspect combination of ischemic and septic CM - Volume status low. CVP 2-3 Hold lasix again today. Will give gentle IVF - co-ox 57% off NE - R&L cath once renal function recovers fully and condition permits - likely Monday if delirium renal function  improved - Hopefully can start HF meds soon as BP and renal function permit - Continue spiro today  - No ACE/ARB/b-blocker yet with hypotension and AKI  3. Acute hypoxic respiratory failure - due to HF and sepsis - extubated 11/26   4. Severe Aortic Stenosis - Worsened AS since May of this year. Will need R&L heart cath after he is stable and has improvement in renal function - eventual evaluation for TAVR.   5. AKI - due to shock/ATN - baseline creatinine 1.6 - Improving with hemodynamic support Creatinine down to 1.7 but back up today. Will give low-dose IVF as above,   6. Myasthenia gravis  - weaned off stress-dose steroids. Now back on home dose prednisone 45m daily  - watch for precipitation of MG crisis    7. PAF - had ? AF overnight on 11/23 - now in NSR. Has not recurred  - Heparin stopped on LMWH for DVT prophylaxis. Will start AC if AF recurs   8. New LBBB - suspect ischemic  9. Hypokalemia - K 3.3. Need to continue to supp .  10. Hypernatremia - With low CVP and rising serum sodium may benefit from low-dose IVF will giv DW5+K20 at 50 for 12 hours  11. Acute delirium  -  likely steroids/metabolic +/- sundowning - CCM managing  12. Drug rash - likely to zosyn. Now back on cefimpime      Signed, Glori Bickers, MD  01/23/2019, 7:55 AM

## 2019-01-23 NOTE — Progress Notes (Signed)
  Speech Language Pathology Treatment: Dysphagia  Patient Details Name: Duane Burns MRN: [id number] DOB: 1935-09-11 Today's Date: 01/23/2019 Time: 0755-0809 SLP Time Calculation (min) (ACUTE ONLY): 14 min  Assessment / Plan / Recommendation Clinical Impression  Pt remains confused but more cooperative this morning, requesting water and drinking it in excess of three ounces consecutively without coughing. Diet of regular solids had been started by NP on previous date. Attempted solids from his breakfast tray with quite prolonged mastication with inability to adequately clear. Coughing was noted with attempts at using a liquid wash, suspect related to mixed consistency boluses. Pt ended up having to spit out some of the chicken sausage and grapes that he had partially masticated but could not clear from his mouth. Will soften diet to Dys 2 (finely chopped) solids, still with thin liquids, but would avoid mixed consistencies. Given his mentation, he will benefit from full supervision during meals for safety and monitoring of oral clearance in between bites. SLP will continue to follow.    HPI HPI: 83 yo male who initally presented to [facility] with findings consistent with sepsis. Recent e. Coli bacteremia treated at OSH.  Not a candidate for lap chole at that time so IR consulted with plans for perc cholecystotomy tube place 11/23 however on repeat imaging, gallbladder distension was resolved so tube placement was deferred. Intubated 11/22-11/26. Has a history of myasthenia gravis. MD note states "Does not appear neuromuscularly weak.  He is able to draw good tidal volume and minute ventilation and pressure support breath." Pt had severe dysphagia in 2016.       SLP Plan  Continue with current plan of care       Recommendations  Diet recommendations: Dysphagia 2 (fine chop);Thin liquid(avoid mixed consistencies) Liquids provided via: Cup;Straw Medication Administration: Crushed  with puree Supervision: Staff to assist with self feeding;Full supervision/cueing for compensatory strategies Compensations: Minimize environmental distractions;Slow rate;Small sips/bites Postural Changes and/or Swallow Maneuvers: Seated upright 90 degrees;Upright 30-60 min after meal                Oral Care Recommendations: Oral care BID Follow up Recommendations: (tba) SLP Visit Diagnosis: Dysphagia, oropharyngeal phase (R13.12) Plan: Continue with current plan of care       GO                Venita Sheffield Tekelia Kareem 01/23/2019, 8:13 AM  Pollyann Glen, M.A. [place] Acute Environmental education officer [phone] Office [phone]

## 2019-01-23 NOTE — Plan of Care (Signed)
No t progressing in many areas. Continues to have increase in resp rate in 40's  Cvp 6 gentle hydration with d5w as na 153. Foley out, urinating condom cath on.   Nsg dx Skin breakdown related to allergic reaction rash, MASD Interventions  Turn Q 2- wash area with soap and water Apply ordered gerhardt cream to areas  Document increases in breakdown When up to chair use waffle pillow  Nsg Dx potential for injury related to disorientation, impulsivity Interventions  Falls percautions, follow interventions Bed alarms  Close observations  Coaching and reorientation [facility] Family sitting with patient

## 2019-01-23 NOTE — Progress Notes (Addendum)
NAME:  Duane Burns, MRN:  [id number], DOB:  11/11/1935, LOS: 8 ADMISSION DATE:  01/13/2019,  CHIEF COMPLAINT:  Chest pain  Brief History   83 yo male who initally presented to [facility] with findings consistent with sepsis. Recent e. Coli bacteremia treated at OSH. While at [facility], patient developed chest pain with multiple PACs and PVCs and was subsequently transferred to [facility] for further evaluation and treatment. On arrival, trops>27k. Lactate 3.7. white count 37k. No ST elevation on initial EKG however did develop new LBBB on later EKG. Cardiology consulted and spoke with intervential cardiology. Given septic state, renal failure, was not candidate for cath lab.  Imaging suggestive of biliary source of sepsis with cholecytitis present. Not a candidate for lap chole at that time so IR consulted with plans for perc cholecystotomy tube place 11/23 however on repeat imaging, gallbladder distension was resolved so tube placement was deferred.  Past Medical History  Myasthenia Gravis HTN Prostate cancer HLD RAS   Significant Hospital Events   11/20 admitted to [facility] with sepsis 11/21 transferred to [facility] for chest pain and EKG changes  Consults:  Cardiology gen surg IR  Procedures:  11/20 CVC 11/22 Intubation 11/23 central IJ line placement  Significant Diagnostic Tests:  11/20 echo: EF 30-35%. Severely decreased LV function. Mild LVH. 11/21 echo: EF 20-25% with severely decreased LV function. No LVH. Diffuse hypokinesis most prominently of the anteroseptal wall and entire apex  11/20 CT c/a/p:pericholecystic edema. Left renal stone  11/21 abd [place]: thickened gallbladder wall with gallstones/sludge. Suspicious for cholecystitis. 11/23 CT a/p: interval resolution of gallbladder distension. New bilateral pleural effusions and bibasilar consolidation  11/21 CXR: worsened CHF pattern 11/22 CXR: progressively worsened pulm edema. Slight increase in  cardiomegaly 11/23 CXR s/p central line placement: improving bilateral pulm infiltrates.  11/24 CXR: pulm vasc congestion improving. Mild bibasilar airspace disease atelectasis vs infection 11/26 chest x-ray: Minimal bilateral vascular congestion.  Micro Data:  11/20 BC>>NGTD  Antimicrobials:  merrem 11/21>> 01/21/2019 vanc 11/21>> 01/21/2019 01/21/2019 Unasyn>> Interim history/subjective:  No acute distress.  Somewhat confused but hemodynamically stable. CVP 2 + 800 cc's Na 153, Chloride 114 K 3.3 Creatinine with slight bump overnight to 2.07 Suspect he is intravascularly dry with CVP of 2 5 beat run of VT 11/28 am ( Mag is 2, K has been repleted)  Co ox this am 56 WBC 29.5 OOB in chair on RA with good sats.  He is slightly confused, but clearing as the morning progresses.  Objective   Blood pressure (!) 125/55, pulse (!) 107, temperature 99.3 F (37.4 C), temperature source Oral, resp. rate (!) 39, height 5\' 8"  (1.727 m), weight 78.5 kg, SpO2 100 %. CVP:  [2 mmHg] 2 mmHg      Intake/Output Summary (Last 24 hours) at 01/23/2019 1110 Last data filed at 01/23/2019 0815 Gross per 24 hour  Intake 667.9 ml  Output 1310 ml  Net -642.1 ml   Filed Weights   01/21/19 0500 01/22/19 0500 01/23/19 0500  Weight: 79.6 kg 76.2 kg 78.5 kg    Examination: General: Thin elderly male, OOB in chair on RA in NAD HEENT: NCAT, No JVD or lymphadenopathy is appreciated Neuro: Aware that he is confused . He does know he is in the hospsital in [place], Does not know date or day of the week, does answer questions appropriately CV: S1, S2, RRR, Heart sounds are regular, No MRG PULM: Bilateral chest excursion, Coarse to clear, diminished  per bases GI: soft, bsx4 active, nontender was evaluated with recent ultrasound of but no fluid collection amenable to drain placement, No RUQ pain GU: Foley Cath Amber urine Extremities: warm/dry, + LE edema, no obvious deformities  Skin: no rashes or  lesions, warm and dry   Resolved Hospital Problem list   none  Assessment & Plan:   Acute respiratory failure acquiring intubation and mechanical ventilation Acute pulmonary edema  likely multifactorial in setting of pulm edema with possible pneumonia. Plan:  Extubated 01/21/2019 Now on RA with sats of 100% Pulmonary toilet IS Mobilize as able Maintain negative I&O as tolerated Titrate oxygen for sats of > 94% Trend CXR ABG prn  Septic shock, leukocytosis, unclear etiology, history of gram-negative bacteremia at previous hospitalization Pt is on steroids Currently on Cefepime ( Switched due to drug rash and high salt content ) Off vasopressor support Plan Trend fever curve and WBC Culture as is clinically indicated Continue ABx  Ischemic cardiac event, newly depressed ejection fraction 20% Acute on chronic heart failure Acute on chronic systolic heart failure, volume overload, resolving Cute pulmonary edema CVP is 2 Trops >27k. New LBBB on EKG. BNP 4100 on admission.  11/21 echo with EF of 20-25% with severe hypokinesis of anteroseptum and apex. Severe Aortic Stenosis.   Will need cath once stable and renal function improves.  CVP 2 Plan  off vasopressor support  Continue to monitor in intensive care unit  Cardiac cath in the future once renal function improves Heart Failure Regimem  Thickened gallbladder on ultrasound examination but without significant fluid for drainage placement. P: Advance diet as tolerated Continue to monitor for any signs and symptoms of cholecystitis Currently on Cefepime  AKI. Lab Results  Component Value Date   CREATININE 2.07 (H) 01/23/2019   CREATININE 1.76 (H) 01/22/2019   CREATININE 2.10 (H) 01/21/2019  Slight bump in Creatinine 11/18 CVP is 2, suspect dry Co Ox 56%  Plan: Monitor and avoid nephrotoxic agents Maintain renal perfusion   Myasthenia Gravis.   Plan:  Currently on stress dose steroids 10  mg of  once daily  Continue Mestinon.  Hypokalemia Recent Labs  Lab 01/21/19 1652 01/22/19 0440 01/23/19 0412  K 3.4* 3.3* 3.3*  Hypernatremia Plan: D5NS at 50 added per Dr. Haroldine Laws  Monitor replete as needed Monitor UO  Urticaria like  Rash developed 11/27 Plan - transitioned  from IV Zosyn to IV cefepime and Flagyl on 11/27 to include anaerobic coverage.   - Cefepime is less likely to cause reaction if penicillin allergy does exist.  - Benadryl for itching.   - Frequent turning and pericare.   - Frequent skin assessments.  Best practice:  Diet: 01/22/2019 advance diet as tolerated Pain/Anxiety/Delirium protocol (if indicated): Currently on Precedex will discontinue DVT prophylaxis: heparin infusion in the setting of MI Mobility: BR Code Status: Full Family Communication: 01/22/2019 patient updated at bedside Disposition: ICU   35  minutes CC APP time  Magdalen Spatz, MSN, AGACNP-BC [place] Pager # [phone] After 4 pm please call [phone] 01/23/2019 11:10 AM     PCCM attending:  83 year old gentleman admitted with history of E. coli bacteremia concern for severe sepsis, potential gallbladder source however not enough fluid to place draining, unfortunately developed NSTEMI, cardiac ischemia lactic acidosis shock severe aortic stenosis.  History of myasthenia gravis.  Multiorgan failure, acute renal failure, acute hypoxemic respiratory failure required intubation secondary to pulmonary edema and ultimately was extubated on 01/21/2019.  Overall doing well  at this time post extubation.  Cardiology would like patient to remain in intensive care unit until cardiac catheterization has been completed.  This is planned for Monday.  Or pending kidney function recovery.  BP (!) 118/53 (BP Location: Left Arm)   Pulse (!) 117   Temp 98.1 F (36.7 C) (Oral)   Resp (!) 37   Ht 5\' 8"  (1.727 m)   Wt 78.5 kg   SpO2 92%   BMI 26.31 kg/m   General:  Elderly male, resting in chair watching television wife at bedside. HEENT: NCAT, sclera clear Cardiac: Regular rate and rhythm, S1-S2 Lungs: Clear to auscultation bilaterally no crackles no wheeze Abdomen: Soft nontender nondistended no right upper quadrant pain with palpation. Skin: Please see images note documented by Merlene Laughter with posterior back buttock flank urticaria.  Images: Reviewed Labs: Reviewed, increasing serum creatinine Increase in sodium  Assessment: Acute hypoxemic respiratory failure, improved Septic shock, improved, presumed GI source history of gram-negative bacteremia, mixed with cardiogenic shock Ischemic heart disease Acute systolic heart failure Ejection fraction 20% Acute renal failure History of myasthenia gravis Acute delirium, slowly improving Urticaria, presumed drug rash  Plan: Remains in the ICU for close hemodynamic respiratory support Cardiology pending catheterization Observing kidney function Delirium better controlled today. Remains off vasopressors As needed Haldol Switched antibiotics from penicillin to cefepime plus Flagyl  Garner Nash, DO [facility] 01/23/2019 2:31 PM

## 2019-01-23 NOTE — Progress Notes (Signed)
Dr Valeta Harms aware of CXR results CVP still 6.Patient still RR 32-40. If patient continues or has more Respiratory distress will need to discuss with cards

## 2019-01-23 NOTE — Evaluation (Signed)
Physical Therapy Evaluation Patient Details Name: Duane Burns MRN: [id number] DOB: February 06, 1936 Today's Date: 01/23/2019   History of Present Illness  Pt adm with sepsis to [facility]. Developed chest pain and transferred to [facility]. Pt with possible cholecystitis but resolved. Pt intubated 11/22-11/26PMH - myasthenia gravis, rt THR, back surgery, lt foot drop, HTN, prostate CA  Clinical Impression  Pt admitted with above diagnosis and presents to PT with functional limitations due to deficits listed below (See PT problem list). Pt needs skilled PT to maximize independence and safety to allow discharge to ST-SNF prior to return home.      Follow Up Recommendations SNF;Supervision/Assistance - 24 hour    Equipment Recommendations  Other (comment)(To be determined)    Recommendations for Other Services       Precautions / Restrictions Precautions Precautions: Fall Required Braces or Orthoses: Other Brace Other Brace: uses Lt AFO which doesn't appear to be in pt's room      Mobility  Bed Mobility Overal bed mobility: Needs Assistance Bed Mobility: Supine to Sit     Supine to sit: Mod assist;HOB elevated     General bed mobility comments: Assist to bring legs off of bed, elevate trunk into sitting and bring hips to EOB.  Transfers Overall transfer level: Needs assistance Equipment used: 2 person hand held assist;Ambulation equipment used Transfers: Sit to/from Stand Sit to Stand: +2 physical assistance;Mod assist         General transfer comment: Assist to bring hips up and for balance. With hand held assist pt with significant flexed posture. With Stedy pt able to achieve more upright with incr time and verbal, tactile cues. Used Stedy for bed to chair.   Ambulation/Gait             General Gait Details: Unable  Financial trader Rankin (Stroke Patients Only)       Balance Overall balance assessment: Needs  assistance Sitting-balance support: Bilateral upper extremity supported;Feet supported;Feet unsupported Sitting balance-Leahy Scale: Poor Sitting balance - Comments: Pt sat EOB x 6-7 minutes with min guard to min assist   Standing balance support: Bilateral upper extremity supported Standing balance-Leahy Scale: Poor Standing balance comment: Stood with Stedy with +2 min for static standing. Stood with HHA with +2 mod assist. Stood x 15-20 sec                             Pertinent Vitals/Pain Pain Assessment: Faces Faces Pain Scale: No hurt    Home Living Family/patient expects to be discharged to:: Private residence Living Arrangements: Spouse/significant other Available Help at Discharge: Family Type of Home: House Home Access: Stairs to enter Entrance Stairs-Rails: Right;Left;Can reach both Entrance Stairs-Number of Steps: 5-6 Home Layout: One level Home Equipment: Environmental consultant - 2 wheels;Other (comment)(lt AFO)      Prior Function           Comments: Pt unable to state due to confusion. Expect pt has been modified independent with mobility     Hand Dominance        Extremity/Trunk Assessment   Upper Extremity Assessment Upper Extremity Assessment: Defer to OT evaluation    Lower Extremity Assessment Lower Extremity Assessment: Generalized weakness       Communication   Communication: No difficulties  Cognition Arousal/Alertness: Awake/alert Behavior During Therapy: Impulsive Overall Cognitive Status: Impaired/Different from baseline Area of Impairment:  Orientation;Attention;Memory;Following commands;Safety/judgement;Awareness;Problem solving                 Orientation Level: Disoriented to;Place;Time;Situation Current Attention Level: Sustained Memory: Decreased recall of precautions;Decreased short-term memory Following Commands: Follows one step commands consistently Safety/Judgement: Decreased awareness of safety;Decreased awareness of  deficits Awareness: Intellectual Problem Solving: Slow processing;Requires verbal cues        General Comments General comments (skin integrity, edema, etc.): HR 130's with activity and RR high 30's    Exercises     Assessment/Plan    PT Assessment Patient needs continued PT services  PT Problem List Decreased strength;Decreased activity tolerance;Decreased balance;Decreased mobility;Decreased cognition;Decreased knowledge of use of DME;Decreased safety awareness;Cardiopulmonary status limiting activity       PT Treatment Interventions DME instruction;Gait training;Functional mobility training;Stair training;Therapeutic activities;Therapeutic exercise;Balance training;Cognitive remediation;Patient/family education    PT Goals (Current goals can be found in the Care Plan section)  Acute Rehab PT Goals Patient Stated Goal: not stated PT Goal Formulation: With patient Time For Goal Achievement: 02/06/19 Potential to Achieve Goals: Good    Frequency Min 3X/week   Barriers to discharge Inaccessible home environment stairs to enter home    Co-evaluation PT/OT/SLP Co-Evaluation/Treatment: Yes Reason for Co-Treatment: For patient/therapist safety PT goals addressed during session: Mobility/safety with mobility;Balance         AM-PAC PT "6 Clicks" Mobility  Outcome Measure Help needed turning from your back to your side while in a flat bed without using bedrails?: A Lot Help needed moving from lying on your back to sitting on the side of a flat bed without using bedrails?: A Lot Help needed moving to and from a bed to a chair (including a wheelchair)?: A Lot Help needed standing up from a chair using your arms (e.g., wheelchair or bedside chair)?: A Lot Help needed to walk in hospital room?: Total Help needed climbing 3-5 steps with a railing? : Total 6 Click Score: 10    End of Session Equipment Utilized During Treatment: Gait belt Activity Tolerance: Patient limited by  fatigue Patient left: in chair;with call bell/phone within reach;with chair alarm set;with nursing/sitter in room Nurse Communication: Mobility status;Need for lift equipment(nurse present for transfer) PT Visit Diagnosis: Unsteadiness on feet (R26.81);Other abnormalities of gait and mobility (R26.89);Muscle weakness (generalized) (M62.81)    Time: [id number] PT Time Calculation (min) (ACUTE ONLY): 36 min   Charges:   PT Evaluation $PT Eval Moderate Complexity: 1 [place] Pager [phone] Office [place] 01/23/2019, 9:51 AM

## 2019-01-24 ENCOUNTER — Inpatient Hospital Stay (HOSPITAL_COMMUNITY): Payer: Medicare HMO

## 2019-01-24 DIAGNOSIS — Z7189 Other specified counseling: Secondary | ICD-10-CM

## 2019-01-24 DIAGNOSIS — I509 Heart failure, unspecified: Secondary | ICD-10-CM

## 2019-01-24 DIAGNOSIS — Z515 Encounter for palliative care: Secondary | ICD-10-CM

## 2019-01-24 DIAGNOSIS — R7989 Other specified abnormal findings of blood chemistry: Secondary | ICD-10-CM

## 2019-01-24 DIAGNOSIS — Z9289 Personal history of other medical treatment: Secondary | ICD-10-CM

## 2019-01-24 LAB — BASIC METABOLIC PANEL
Anion gap: 10 (ref 5–15)
BUN: 69 mg/dL — ABNORMAL HIGH (ref 8–23)
CO2: 24 mmol/L (ref 22–32)
Calcium: 8.1 mg/dL — ABNORMAL LOW (ref 8.9–10.3)
Chloride: 117 mmol/L — ABNORMAL HIGH (ref 98–111)
Creatinine, Ser: 2.38 mg/dL — ABNORMAL HIGH (ref 0.61–1.24)
GFR calc Af Amer: 29 mL/min — ABNORMAL LOW (ref >60)
GFR calc non Af Amer: 25 mL/min — ABNORMAL LOW (ref >60)
Glucose, Bld: 118 mg/dL — ABNORMAL HIGH (ref 70–99)
Potassium: 3.3 mmol/L — ABNORMAL LOW (ref 3.5–5.1)
Sodium: 151 mmol/L — ABNORMAL HIGH (ref 135–145)

## 2019-01-24 LAB — HEMOGLOBIN A1C
Hgb A1c MFr Bld: 6.2 % — ABNORMAL HIGH (ref 4.8–5.6)
Mean Plasma Glucose: 131.24 mg/dL

## 2019-01-24 LAB — CBC
HCT: 31.6 % — ABNORMAL LOW (ref 39.0–52.0)
Hemoglobin: 10.3 g/dL — ABNORMAL LOW (ref 13.0–17.0)
MCH: 29.3 pg (ref 26.0–34.0)
MCHC: 32.6 g/dL (ref 30.0–36.0)
MCV: 90 fL (ref 80.0–100.0)
Platelets: 167 10*3/uL (ref 150–400)
RBC: 3.51 MIL/uL — ABNORMAL LOW (ref 4.22–5.81)
RDW: 14.6 % (ref 11.5–15.5)
WBC: 30.8 10*3/uL — ABNORMAL HIGH (ref 4.0–10.5)
nRBC: 0 % (ref 0.0–0.2)

## 2019-01-24 LAB — COOXEMETRY PANEL
Carboxyhemoglobin: 0.8 % (ref 0.5–1.5)
Methemoglobin: 1 % (ref 0.0–1.5)
O2 Saturation: 56.7 %
Total hemoglobin: 11 g/dL — ABNORMAL LOW (ref 12.0–16.0)

## 2019-01-24 LAB — POTASSIUM: Potassium: 3.6 mmol/L (ref 3.5–5.1)

## 2019-01-24 LAB — GLUCOSE, CAPILLARY
Glucose-Capillary: 102 mg/dL — ABNORMAL HIGH (ref 70–99)
Glucose-Capillary: 137 mg/dL — ABNORMAL HIGH (ref 70–99)
Glucose-Capillary: 146 mg/dL — ABNORMAL HIGH (ref 70–99)
Glucose-Capillary: 141 mg/dL — ABNORMAL HIGH (ref 70–99)
Glucose-Capillary: 122 mg/dL — ABNORMAL HIGH (ref 70–99)

## 2019-01-24 LAB — MAGNESIUM: Magnesium: 2.3 mg/dL (ref 1.7–2.4)

## 2019-01-24 MED ORDER — POTASSIUM CL IN DEXTROSE 5% 20 MEQ/L IV SOLN
20.0000 meq | INTRAVENOUS | Status: AC
  Administered 2019-01-24: 20 meq via INTRAVENOUS
  Filled 2019-01-24: qty 1000

## 2019-01-24 MED ORDER — POTASSIUM CHLORIDE 10 MEQ/50ML IV SOLN
10.0000 meq | INTRAVENOUS | Status: AC
  Administered 2019-01-24 (×2): 10 meq via INTRAVENOUS

## 2019-01-24 MED ORDER — POTASSIUM CHLORIDE 10 MEQ/50ML IV SOLN
INTRAVENOUS | Status: AC
  Filled 2019-01-24: qty 100

## 2019-01-24 MED ORDER — POTASSIUM CHLORIDE 10 MEQ/100ML IV SOLN: 10.0000 meq | INTRAVENOUS | Status: DC

## 2019-01-24 NOTE — Progress Notes (Signed)
NAME:  Duane Burns, MRN:  [id number], DOB:  26-Jun-1935, LOS: 9 ADMISSION DATE:  12/28/2018,  CHIEF COMPLAINT:  Chest pain  Brief History   83 yo male who initally presented to [facility] with findings consistent with sepsis. Recent e. Coli bacteremia treated at OSH. While at [facility], patient developed chest pain with multiple PACs and PVCs and was subsequently transferred to [facility] for further evaluation and treatment. On arrival, trops>27k. Lactate 3.7. white count 37k. No ST elevation on initial EKG however did develop new LBBB on later EKG. Cardiology consulted and spoke with intervential cardiology. Given septic state, renal failure, was not candidate for cath lab.  Imaging suggestive of biliary source of sepsis with cholecytitis present. Not a candidate for lap chole at that time so IR consulted with plans for perc cholecystotomy tube place 11/23 however on repeat imaging, gallbladder distension was resolved so tube placement was deferred.  Past Medical History  Myasthenia Gravis HTN Prostate cancer HLD RAS   Significant Hospital Events   11/20 admitted to [facility] with sepsis 11/21 transferred to [facility] for chest pain and EKG changes  Consults:  Cardiology gen surg IR  Procedures:  11/20 CVC 11/22 Intubation 11/23 central IJ line placement  Significant Diagnostic Tests:  11/20 echo: EF 30-35%. Severely decreased LV function. Mild LVH. 11/21 echo: EF 20-25% with severely decreased LV function. No LVH. Diffuse hypokinesis most prominently of the anteroseptal wall and entire apex  11/20 CT c/a/p:pericholecystic edema. Left renal stone  11/21 abd [place]: thickened gallbladder wall with gallstones/sludge. Suspicious for cholecystitis. 11/23 CT a/p: interval resolution of gallbladder distension. New bilateral pleural effusions and bibasilar consolidation  11/21 CXR: worsened CHF pattern 11/22 CXR: progressively worsened pulm edema. Slight increase in  cardiomegaly 11/23 CXR s/p central line placement: improving bilateral pulm infiltrates.  11/24 CXR: pulm vasc congestion improving. Mild bibasilar airspace disease atelectasis vs infection 11/26 chest x-ray: Minimal bilateral vascular congestion.  Micro Data:  11/20 BC>>NGTD  Antimicrobials:  merrem 11/21>> 01/21/2019 vanc 11/21>> 01/21/2019 01/21/2019 Unasyn>> Interim history/subjective:  Resting in bed.  No acute events overnight.  Objective   Blood pressure (!) 115/97, pulse 100, temperature 98 F (36.7 C), temperature source Axillary, resp. rate (!) 28, height 5\' 8"  (1.727 m), weight 76.9 kg, SpO2 100 %. CVP:  [3 mmHg-6 mmHg] 3 mmHg      Intake/Output Summary (Last 24 hours) at 01/24/2019 0941 Last data filed at 01/24/2019 0900 Gross per 24 hour  Intake 1396.53 ml  Output 1155 ml  Net 241.53 ml   Filed Weights   01/22/19 0500 01/23/19 0500 01/24/19 0331  Weight: 76.2 kg 78.5 kg 76.9 kg    Examination: General: Elderly male resting in bed no distress HEENT: NCAT, sclera clear Neuro: Awake alert, following commands CV: S1-S2 regular rate and rhythm no MRG PULM: Bilateral breath sounds, no crackles no wheeze GI: Soft, nontender nondistended GU: Foley in place Extremities: No significant edema Skin: Some skin tears and petechia   Resolved Hospital Problem list   none  Assessment & Plan:   Acute respiratory failure acquiring intubation and mechanical ventilation-resolved Acute pulmonary edema-resolved likely multifactorial in setting of pulm edema with possible pneumonia. Plan:  Extubated on 01/21/2019 Now on room air 100% Continue pulmonary toileting, I-S, mobilize get up in the bed, up in chair as much as tolerated  Septic shock, leukocytosis, unclear etiology, history of gram-negative bacteremia at previous hospitalization, shock state resolved Leukocytosis secondary to steroids Currently on Cefepime,  2 more doses stop Plan Low white blood cell count  Antibiotics for 2 more days  Ischemic cardiac event, newly depressed ejection fraction 20% Acute on chronic heart failure Acute on chronic systolic heart failure, volume overload, resolving Trops >27k. New LBBB on EKG. BNP 4100 on admission.  11/21 echo with EF of 20-25% with severe hypokinesis of anteroseptum and apex. Severe Aortic Stenosis.   Will need cath once stable and renal function improves.  CVP 2 Plan Planning left heart catheterization Holding due to worsening kidney function Appreciate cardiology input  Thickened gallbladder on ultrasound examination but without significant fluid for drainage placement. P: Advance diet as tolerated  AKI. Lab Results  Component Value Date   CREATININE 2.38 (H) 01/24/2019   CREATININE 2.07 (H) 01/23/2019   CREATININE 1.76 (H) 01/22/2019  Slight bump in Creatinine 11/18 CVP is 2, suspect dry Co Ox 56% Plan: Avoiding nephrotoxic drugs Continue to follow   Myasthenia Gravis.   Plan:  Continue 10 mg prednisone daily Continue Mestinon home medications  Hypokalemia Recent Labs  Lab 01/22/19 0440 01/23/19 0412 01/24/19 0330  K 3.3* 3.3* 3.3*  Hypernatremia Plan: Replete as needed, free water deficit correcting  Urticaria like  Rash developed 11/27 Plan Possibly related to penicillins, switched On steroids already Benadryl for itching  GOC: Palliative to meet with patient and family   Best practice:  Diet: 01/22/2019 advance diet as tolerated Pain/Anxiety/Delirium protocol (if indicated): n/a DVT prophylaxis: heparin infusion in the setting of MI Mobility: BR Code Status: Full Family Communication: 01/22/2019 patient updated at bedside Disposition: ICU - awaiting [place], DO [place] [facility] 01/24/2019 9:51 AM

## 2019-01-24 NOTE — Consult Note (Signed)
Consultation Note Date: 01/24/2019   Patient Name: Duane Burns  DOB: [birth year]  MRN: [id number]  Age / Sex: 83 y.o., male  PCP: [name], DO Referring Physician: Garner Nash, DO  Reason for Consultation: Establishing goals of care and Psychosocial/spiritual support  HPI/Patient Profile: 83 y.o. male  admitted on 01/20/2019 with PMH significant for HTN, moderate to severe AS, myasthenia gravis, HLD, recent e. Coli bacteremia treated at [facility] (10/19-10/29) who presented to [facility] ED with sepsis secondary to acute cholecystitis.  He was found to have elevated troponins up to 27,000 and new LBBB on EKG. Echo 5/20 showed EF 60-65% with mod-severe AS. ECHO this admission shows EF 20-25% and progression to severe AS.  He also has been having intermittent runs of atrial fibrillation and NSVT but is unable to undergo cath at this time as he is still on pressors.  Patient required intubation, successfully extubated on 01/21/2019  Today is day 9 of his hospitalization.  He remains in the intensive care unit.  Creatinine today is 2.38 and WBCs are 30.8.  He is intermittently confused.  Patient and family face treatment option decisions, advanced directive decisions and anticipatory care needs    Clinical Assessment and Goals of Care:  This NP Wadie Lessen reviewed medical records, received report from team, assessed the patient and then meet at the patient's bedside along with his wife to discuss diagnosis, prognosis, GOC, disposition and options.  Concept of Palliative Care was discussed  A  discussion was had today regarding advanced directives.  Concepts specific to code status, artifical feeding and hydration, continued IV antibiotics and rehospitalization was had.  The difference between a aggressive medical intervention path  and a palliative comfort care path for this patient at this  time was had.  Values and goals of care important to patient and family were attempted to be elicited.    Questions and concerns addressed.   Family encouraged to call with questions or concerns.    PMT will continue to support holistically.     There is no documented healthcare power of attorney or advanced directive.  Patient's wife is the main support person and decision maker at this time    SUMMARY OF RECOMMENDATIONS    Code Status/Advance Care Planning:  Full code   Patient's wife is able to verbalize that at this time they are open to all offered and available medical interventions to prolong life.  If however patient would be "stuck on machines" for any length of time, "he would not want it"  Wife is open to continue conversations with palliative medicine team for ongoing support and navigating healthcare treatment options and decisions.`   Palliative Prophylaxis:   Aspiration, Bowel Regimen, Delirium Protocol and Oral Care  Additional Recommendations (Limitations, Scope, Preferences):  Full Scope Treatment  Psycho-social/Spiritual:   Desire for further Chaplaincy support:yes   Additional Recommendations: Created space and opportunity for patient's wife to explore her thoughts and feelings regarding her husband's current medical situation.  She tells me  that "this is all happened so quickly" and that he was thriving only a few months ago.  She verbalizes concern for herself living alone at home and being diabetic.      Prognosis:   Unable to determine  Discharge Planning: To Be Determined      Primary Diagnoses: Present on Admission:  Sepsis ([place])  Myasthenia gravis ([place])  Essential hypertension   I have reviewed the medical record, interviewed the patient and family, and examined the patient. The following aspects are pertinent.  Past Medical History:  Diagnosis Date   Arthritis    Cancer ([place])    History of skin cancer / hx Prostate cancer     Dysphagia 11/10/2014   Dysphonia 11/11/2014   Foot drop, left    DUE TO NERVE INJURY IN BACK PER PT   HNP (herniated nucleus pulposus with myelopathy), thoracic    HISTORY OF HNP   Hyperlipidemia    Hypertension    Myasthenia gravis ([place]) 11/12/2014   versus Guillain-Barr syndrome.   Pneumonia    JAN 2016   Urgency of urination    Urinary leakage    Social History   Socioeconomic History   Marital status: Married    Spouse name: Not on file   Number of children: Not on file   Years of education: Not on file   Highest education level: Not on file  Occupational History   Not on file  Social Needs   Financial resource strain: Not hard at all   Food insecurity    Worry: Never true    Inability: Never true   Transportation needs    Medical: No    Non-medical: No  Tobacco Use   Smoking status: Former Smoker    Quit date: 05/05/1994    Years since quitting: 24.7   Smokeless tobacco: Never Used  Substance and Sexual Activity   Alcohol use: No   Drug use: No   Sexual activity: Not Currently  Lifestyle   Physical activity    Days per week: 0 days    Minutes per session: 0 min   Stress: Not at all  Relationships   Social connections    Talks on phone: More than three times a week    Gets together: More than three times a week    Attends religious service: More than 4 times per year    Active member of club or organization: Yes    Attends meetings of clubs or organizations: More than 4 times per year    Relationship status: Married  Other Topics Concern   Not on file  Social History Narrative   Not on file   Family History  Problem Relation Age of Onset   Diabetes Other    Hypertension Other    Scheduled Meds:  aspirin  81 mg Oral Daily   atorvastatin  80 mg Oral q1800   chlorhexidine gluconate (MEDLINE KIT)  15 mL Mouth Rinse BID   Chlorhexidine Gluconate Cloth  6 each Topical Daily   cholecalciferol  1,000 Units Oral Daily    clopidogrel  75 mg Oral Daily   Gerhardt's butt cream   Topical BID   heparin injection (subcutaneous)  5,000 Units Subcutaneous Q8H   multivitamin with minerals  1 tablet Oral Daily   predniSONE  10 mg Oral Q breakfast   pyridostigmine  60 mg Oral TID   senna-docusate  2 tablet Oral BID   sodium chloride flush  10-40 mL Intracatheter Q12H  sodium chloride flush  3 mL Intravenous Q12H   spironolactone  12.5 mg Oral Daily   Continuous Infusions:  sodium chloride 10 mL/hr at 01/24/19 0528   ceFEPime (MAXIPIME) IV 2 g (01/24/19 0841)   famotidine (PEPCID) IV Stopped (01/24/19 0000)   metronidazole Stopped (01/24/19 0528)   PRN Meds:.sodium chloride, acetaminophen **OR** acetaminophen, bisacodyl, diphenhydrAMINE, fentaNYL (SUBLIMAZE) injection, guaiFENesin, haloperidol lactate, levalbuterol, midazolam, ondansetron **OR** ondansetron (ZOFRAN) IV, polyethylene glycol, sennosides, sodium chloride flush, sodium chloride flush, traZODone Medications Prior to Admission:  Prior to Admission medications   Medication Sig Start Date End Date Taking? Authorizing Provider  amoxicillin-clavulanate (AUGMENTIN) 875-125 MG tablet Take 1 tablet by mouth 2 (two) times daily. 12/24/18  Yes [provider]  aspirin EC 81 MG tablet Take 81 mg by mouth daily.   Yes [provider]  cholecalciferol (VITAMIN D) 1000 UNITS tablet Take 1,000 Units by mouth daily.   Yes [provider]  lisinopril (ZESTRIL) 5 MG tablet TAKE 1 TABLET BY MOUTH EVERY DAY 11/12/18  Yes Ronnie Doss M, DO  Multiple Vitamin (MULTIVITAMIN WITH MINERALS) TABS tablet Take 1 tablet by mouth daily.   Yes [provider]  Omega-3 Fatty Acids (FISH OIL) 1200 MG CAPS Take 1-2 capsules (1,200-2,400 mg total) by mouth 2 (two) times daily. RESTART IN 1 WEEK IF YOUR SWALLOWING CONTINUES TO IMPROVE. Take 2 capsules in the morning and 1 capsule in the evening 11/16/14  Yes Rexene Alberts, MD   pravastatin (PRAVACHOL) 80 MG tablet Take 1 tablet (80 mg total) by mouth every evening. 12/28/18  Yes Gottschalk, Leatrice Jewels M, DO  predniSONE (DELTASONE) 10 MG tablet Take 10 mg by mouth daily. 04/07/17  Yes [provider]  pyridostigmine (MESTINON) 60 MG tablet Take 60 mg by mouth 3 (three) times daily.    Yes [provider]   Allergies  Allergen Reactions   Penicillins Rash    Noted after change from Unasyn to Zosyn 01/22/19   Review of Systems  Unable to perform ROS: Mental status change    Physical Exam Constitutional:      Appearance: He is ill-appearing.     Interventions: Nasal cannula in place.  Cardiovascular:     Rate and Rhythm: Normal rate.  Pulmonary:     Breath sounds: Normal breath sounds.  Skin:    General: Skin is warm and dry.     Vital Signs: BP 119/70    Pulse (!) 57    Temp 98 F (36.7 C) (Axillary)    Resp (!) 22    Ht _0  (1.727 m)    Wt 76.9 kg    SpO2 92%    BMI 25.78 kg/m  Pain Scale: 0-10   Pain Score: 0-No pain   SpO2: SpO2: 92 % O2 Device:SpO2: 92 % O2 Flow Rate: .O2 Flow Rate (L/min): 2 L/min  IO: Intake/output summary:   Intake/Output Summary (Last 24 hours) at 01/24/2019 6269 Last data filed at 01/24/2019 0732 Gross per 24 hour  Intake 1216.15 ml  Output 1180 ml  Net 36.15 ml    LBM: Last BM Date: 01/23/19 Baseline Weight: Weight: 87.1 kg Most recent weight: Weight: 76.9 kg     Palliative Assessment/Data:    Discussed with Dr Haroldine Laws and bedside RN  Time In: 0900 Time Out: 1010 Time Total: 70 minutes Greater than 50%  of this time was spent counseling and coordinating care related to the above assessment and plan.  Signed by: Wadie Lessen, NP   Please  contact Palliative Medicine Team phone at [phone] for questions and concerns.  For individual provider: See Shea Evans

## 2019-01-24 NOTE — Progress Notes (Signed)
[place] [facility] Progress Note Patient Name: Duane Burns DOB: September 18, 1935 MRN: [id number]   Date of Service  01/24/2019  HPI/Events of Note  K 3.2, Cr > 2. Unable to swallow big K pill.  eICU Interventions  - DC oral K - Kcl 10 meq IV once. Follow K level at 10 AM     Intervention Category Minor Interventions: Electrolytes abnormality - evaluation and management  Elmer Sow 01/24/2019, 6:14 AM

## 2019-01-24 NOTE — Progress Notes (Signed)
Progress Note  Patient Name: Duane Burns Date of Encounter: 01/24/2019  Primary Cardiologist: Kate Sable, MD   Subjective   Extubated 11/26. Remains confused but seems to be improving. Off NE. Co-ox stable at 57%  Developed drug rash after switching unasyn to zosyn. Now back on cefipime. Rash improved  Received IVF yesterday. Sodium 153 -> 151 . K 3.3. Po intake poor. Denies CP or SOB. Afebrile but WBC still 31k   CVP 5  Inpatient Medications    Scheduled Meds: . aspirin  81 mg Oral Daily  . atorvastatin  80 mg Oral q1800  . chlorhexidine gluconate (MEDLINE KIT)  15 mL Mouth Rinse BID  . Chlorhexidine Gluconate Cloth  6 each Topical Daily  . cholecalciferol  1,000 Units Oral Daily  . clopidogrel  75 mg Oral Daily  . Gerhardt's butt cream   Topical BID  . heparin injection (subcutaneous)  5,000 Units Subcutaneous Q8H  . multivitamin with minerals  1 tablet Oral Daily  . predniSONE  10 mg Oral Q breakfast  . pyridostigmine  60 mg Oral TID  . senna-docusate  2 tablet Oral BID  . sodium chloride flush  10-40 mL Intracatheter Q12H  . sodium chloride flush  3 mL Intravenous Q12H  . spironolactone  12.5 mg Oral Daily   Continuous Infusions: . sodium chloride 10 mL/hr at 01/24/19 0528  . ceFEPime (MAXIPIME) IV 200 mL/hr at 01/24/19 0900  . famotidine (PEPCID) IV Stopped (01/24/19 0000)  . metronidazole Stopped (01/24/19 0528)   PRN Meds: sodium chloride, acetaminophen **OR** acetaminophen, bisacodyl, diphenhydrAMINE, fentaNYL (SUBLIMAZE) injection, guaiFENesin, haloperidol lactate, levalbuterol, midazolam, ondansetron **OR** ondansetron (ZOFRAN) IV, polyethylene glycol, sennosides, sodium chloride flush, sodium chloride flush, traZODone   Vital Signs    Vitals:   01/24/19 0700 01/24/19 0800 01/24/19 0815 01/24/19 0900  BP: 119/70 (!) 77/52  (!) 115/97  Pulse: (!) 57 99  100  Resp: (!) 22 18  (!) 28  Temp:   98 F (36.7 C)   TempSrc:   Axillary   SpO2: 92%  100%  100%  Weight:      Height:        Intake/Output Summary (Last 24 hours) at 01/24/2019 0945 Last data filed at 01/24/2019 0900 Gross per 24 hour  Intake 1396.53 ml  Output 1155 ml  Net 241.53 ml   Filed Weights   01/22/19 0500 01/23/19 0500 01/24/19 0331  Weight: 76.2 kg 78.5 kg 76.9 kg    Telemetry    Sinus tach Frequent PVCs 100-110 Personally reviewed  Physical Exam   General:  Elderly frail mildly confused No resp difficulty HEENT: normal Neck: supple. no JVD. Carotids 2+ bilat; no bruits. No lymphadenopathy or thryomegaly appreciated. Cor: PMI nondisplaced. Regular rate & rhythm. 2/6 AS Lungs: clear Abdomen: soft, mild diffuse tenderness nondistended. No hepatosplenomegaly. No bruits or masses. Good bowel sounds. Extremities: no cyanosis, clubbing, rash, edema Neuro: alert  Tells me he is in [facility]. Year 2002  cranial nerves grossly intact. moves all 4 extremities w/o difficulty. Affect pleasant   Labs    Chemistry Recent Labs  Lab 01/19/19 0345  01/22/19 0440 01/23/19 0412 01/24/19 0330  NA 144   < > 152* 153* 151*  K 3.4*   < > 3.3* 3.3* 3.3*  CL 109   < > 112* 114* 117*  CO2 22   < > 26 24 24   GLUCOSE 84   < > 92 89 118*  BUN 57*   < >  74* 72* 69*  CREATININE 2.87*   < > 1.76* 2.07* 2.38*  CALCIUM 7.9*   < > 8.4* 8.2* 8.1*  PROT 5.1*  --   --   --   --   ALBUMIN 2.4*  --   --   --   --   AST 78*  --   --   --   --   ALT 88*  --   --   --   --   ALKPHOS 82  --   --   --   --   BILITOT 2.5*  --   --   --   --   GFRNONAA 20*   < > 35* 29* 25*  GFRAA 23*   < > 41* 34* 29*  ANIONGAP 13   < > 14 15 10    < > = values in this interval not displayed.     Hematology Recent Labs  Lab 01/22/19 0440 01/23/19 0412 01/24/19 0330  WBC 31.3* 29.5* 30.8*  RBC 3.84* 3.92* 3.51*  HGB 11.3* 11.5* 10.3*  HCT 35.2* 35.4* 31.6*  MCV 91.7 90.3 90.0  MCH 29.4 29.3 29.3  MCHC 32.1 32.5 32.6  RDW 14.2 14.3 14.6  PLT 158 150 167    Cardiac EnzymesNo  results for input(s): TROPONINI in the last 168 hours. No results for input(s): TROPIPOC in the last 168 hours.   BNP No results for input(s): BNP, PROBNP in the last 168 hours.   DDimer No results for input(s): DDIMER in the last 168 hours.   Radiology    Dg Chest Port 1 View  Result Date: 01/24/2019 CLINICAL DATA:  Respiratory failure, code sepsis. EXAM: PORTABLE CHEST 1 VIEW COMPARISON:  Chest x-rays dated 01/23/2019 FINDINGS: RIGHT IJ central line appears well positioned with tip at the level of the lower SVC/cavoatrial junction. Heart size and mediastinal contours are stable. Probable mild bibasilar atelectasis. Mild central pulmonary vascular congestion. Stable elevation of the RIGHT hemidiaphragm. IMPRESSION: 1. Mild central pulmonary vascular congestion suggesting mild CHF/volume overload. No evidence of pneumonia or overt alveolar pulmonary edema. 2. Probable mild bibasilar atelectasis. Electronically Signed   By: Franki Cabot M.D.   On: 01/24/2019 06:19   Dg Chest Port 1 View  Result Date: 01/23/2019 CLINICAL DATA:  Cough for 2 days EXAM: PORTABLE CHEST 1 VIEW COMPARISON:  Chest radiograph 01/23/2019 FINDINGS: Right IJ central venous catheter tip projects over the superior vena cava. Monitoring leads overlie the patient. Stable cardiac and mediastinal contours. Aortic atherosclerosis. Elevation right hemidiaphragm. Bilateral interstitial pulmonary opacities. No pleural effusion or pneumothorax. IMPRESSION: Mild interstitial opacities bilaterally may represent edema. Elevation right hemidiaphragm. Electronically Signed   By: Lovey Newcomer M.D.   On: 01/23/2019 18:00   Am Dg Chest Port 1 View  Result Date: 01/23/2019 CLINICAL DATA:  Respiratory failure. EXAM: PORTABLE CHEST 1 VIEW COMPARISON:  Chest x-ray dated 01/21/2019. FINDINGS: Endotracheal tube is been removed. RIGHT IJ central line remains well positioned with tip at the level of the lower SVC. Heart size and mediastinal contours  are stable. Stable mild central pulmonary vascular congestion. Lungs otherwise clear. No pleural effusion or pneumothorax is seen. IMPRESSION: 1. Stable mild central pulmonary vascular congestion. No overt alveolar pulmonary edema. 2. No evidence of pneumonia. 3. Endotracheal tube has been removed. Electronically Signed   By: Franki Cabot M.D.   On: 01/23/2019 07:55    Cardiac Studies   Echo 11/21  1. Left ventricular ejection fraction, by visual estimation, is 20  to 25%. The left ventricle has severely decreased function. There is no left ventricular hypertrophy.  2. Diffuse hypokinesis with most prominent severe hypokinesis of the anteroseptal wall and the entire apex.  3. Global right ventricle was not assessed.The right ventricular size is not assessed. Right vetricular wall thickness was not assessed.  4. Left atrial size was not assessed.  5. Right atrial size was not assessed.  6. The pericardium was not assessed.  7. The mitral valve was not assessed. not assessed mitral valve regurgitation.  8. The tricuspid valve is not assessed. Tricuspid valve regurgitation not assessed.  9. Aortic valve regurgitation Not assessed. 10. The aortic valve was not assessed. Aortic valve regurgitation Not assessed. 11. The pulmonic valve was not assessed. Pulmonic valve regurgitation Not assessed. 12. Not assessed. 13. The interatrial septum was not assessed.  ECHO 11/20 12/2018 LEFT VENTRICLE PLAX 2D LVIDd: 4.17 cm LVIDs: 2.50 cm LV PW: 1.26 cm LV IVS: 1.30 cm LVOT diam: 1.80 cm LV SV: 55 ml LV SV Index: 26.59 LVOT Area: 2.54 cm  06/2018 +------------------+------------++ AORTIC VALVE   +------------------+------------++ AV Area (Vmax): 0.88 cm  +------------------+------------++ AV Area (Vmean): 0.89 cm  +------------------+------------++ AV Area (VTI): 0.90 cm   +------------------+------------++ AV Vmax: 372.81 cm/s  +------------------+------------++ AV Vmean: 255.916 cm/s +------------------+------------++ AV VTI: 0.923 m  +------------------+------------++ AV Peak Grad: 55.6 mmHg  +------------------+------------++ AV Mean Grad: 30.9 mmHg  +------------------+------------++ LVOT Vmax: 86.15 cm/s  +------------------+------------++ LVOT Vmean: 60.100 cm/s  +------------------+------------++ LVOT VTI: 0.220 m  +------------------+------------++ LVOT/AV VTI ratio:0.24  +------------------+------------++ AR PHT: 779 msec  +------------------+------------++    Patient Profile     Ganon Demasi is a 83yo male with PMH HTN, moderate to severe AS, myasthenia gravis, HLD, recent e. Coli bacteremia treated at [facility] (10/19-10/29) presenting with sepsis secondary to acute cholecystitis with acute HF and severe aortic stenosis.   Assessment & Plan    1. Shock - combined sepsis/cardiogenic  - PCT very high (67!) - Sepsis now resolved. Initially felt to be cholecystitis but now felt less likely - Meropenem switched to Unasyn on 11/26. (vanc stopped). Switched to zosyn deu to salt load. Developed rash. (likely PCN related) Now back on cefipime. Cx negative  - Had e. coli sepsis recently. So this is recurrent. Source unclear (GI vs GU) -> CCM managing - NE off 11/26 BP ok. Co-ox stable at 57%. With low CVP and rising serum sodium and creatinine may benefit from more IVF will give additional [id number] at 50 for 12 hours - WBC remains at 31K (seems high for low-dose prednisone). Consider ID involvement, CCM managing  2. Acute Systolic HF -> cardiogenic shock - Echo newly down to 20-25% with severe AS from 60-65% with mod-severe AS on 06/2018. - hs Trop > 27K  - suspect combination of ischemic and septic CM - Volume  status low. CVP 5 Hold lasix again today. Will give gentle IVF again as above - co-ox stable at 57% off NE - R&L cath once renal function recovers fully and condition permits. Given rising creatinine and delirium he is not ready  - Hopefully can start HF meds soon as BP and renal function permit - Stop spiro with AKI  - No ACE/ARB/b-blocker yet with hypotension and AKI  3. Acute hypoxic respiratory failure - due to HF and sepsis - extubated 11/26  - stable. CCM managing  4. Severe Aortic Stenosis - Worsened AS since May of this year. Will need R&L heart cath after he is stable and has  improvement in renal function - eventual evaluation for TAVR.   5. AKI - due to shock/ATN - baseline creatinine 1.6 up to 2.4 today. Hold spiro. Give IVF   6. Myasthenia gravis  - weaned off stress-dose steroids. Now back on home dose prednisone 48m daily  - watch for precipitation of MG crisis    7. PAF - had ? AF overnight on 11/23 - now in NSR. Has not recurred  - Heparin stopped on LMWH for DVT prophylaxis. Will start AC if AF recurs   8. New LBBB - suspect ischemic  9. Hypokalemia - K 3.3. Need to continue to supp .  10. Hypernatremia -  Will give gentle IVF again as above  11. Acute delirium  - likely steroids/metabolic +/- sundowning - CCM managing  12. Drug rash - likely to zosyn. Now back on cefepime. Resolved     Signed, DGlori Bickers MD  01/24/2019, 9:45 AM

## 2019-01-25 ENCOUNTER — Inpatient Hospital Stay (HOSPITAL_COMMUNITY): Payer: Medicare HMO

## 2019-01-25 ENCOUNTER — Ambulatory Visit (HOSPITAL_COMMUNITY): Payer: Medicare HMO

## 2019-01-25 ENCOUNTER — Encounter (HOSPITAL_COMMUNITY): Admission: EM | Disposition: E | Payer: Self-pay | Source: Home / Self Care | Attending: Pulmonary Disease

## 2019-01-25 ENCOUNTER — Inpatient Hospital Stay (HOSPITAL_COMMUNITY): Payer: Medicare HMO | Admitting: Anesthesiology

## 2019-01-25 ENCOUNTER — Ambulatory Visit (HOSPITAL_COMMUNITY): Admission: RE | Admit: 2019-01-25 | Payer: Medicare HMO | Source: Ambulatory Visit

## 2019-01-25 DIAGNOSIS — R4189 Other symptoms and signs involving cognitive functions and awareness: Secondary | ICD-10-CM

## 2019-01-25 DIAGNOSIS — I214 Non-ST elevation (NSTEMI) myocardial infarction: Secondary | ICD-10-CM

## 2019-01-25 DIAGNOSIS — R627 Adult failure to thrive: Secondary | ICD-10-CM

## 2019-01-25 DIAGNOSIS — I9581 Postprocedural hypotension: Secondary | ICD-10-CM

## 2019-01-25 DIAGNOSIS — I10 Essential (primary) hypertension: Secondary | ICD-10-CM

## 2019-01-25 DIAGNOSIS — I63419 Cerebral infarction due to embolism of unspecified middle cerebral artery: Secondary | ICD-10-CM

## 2019-01-25 DIAGNOSIS — I639 Cerebral infarction, unspecified: Secondary | ICD-10-CM

## 2019-01-25 DIAGNOSIS — Z978 Presence of other specified devices: Secondary | ICD-10-CM

## 2019-01-25 DIAGNOSIS — I63443 Cerebral infarction due to embolism of bilateral cerebellar arteries: Secondary | ICD-10-CM

## 2019-01-25 HISTORY — PX: IR ANGIO VERTEBRAL SEL VERTEBRAL UNI R MOD SED: IMG5368

## 2019-01-25 HISTORY — PX: IR ANGIO INTRA EXTRACRAN SEL COM CAROTID INNOMINATE BILAT MOD SED: IMG5360

## 2019-01-25 HISTORY — PX: IR ANGIO VERTEBRAL SEL SUBCLAVIAN INNOMINATE UNI L MOD SED: IMG5364

## 2019-01-25 HISTORY — PX: RADIOLOGY WITH ANESTHESIA: SHX6223

## 2019-01-25 LAB — GLUCOSE, CAPILLARY
Glucose-Capillary: 101 mg/dL — ABNORMAL HIGH (ref 70–99)
Glucose-Capillary: 110 mg/dL — ABNORMAL HIGH (ref 70–99)
Glucose-Capillary: 121 mg/dL — ABNORMAL HIGH (ref 70–99)
Glucose-Capillary: 129 mg/dL — ABNORMAL HIGH (ref 70–99)
Glucose-Capillary: 143 mg/dL — ABNORMAL HIGH (ref 70–99)
Glucose-Capillary: 151 mg/dL — ABNORMAL HIGH (ref 70–99)

## 2019-01-25 LAB — POCT I-STAT 7, (LYTES, BLD GAS, ICA,H+H)
Acid-base deficit: 1 mmol/L (ref 0.0–2.0)
Acid-base deficit: 4 mmol/L — ABNORMAL HIGH (ref 0.0–2.0)
Bicarbonate: 22 mmol/L (ref 20.0–28.0)
Bicarbonate: 21.7 mmol/L (ref 20.0–28.0)
Bicarbonate: 19 mmol/L — ABNORMAL LOW (ref 20.0–28.0)
Calcium, Ion: 1.17 mmol/L (ref 1.15–1.40)
Calcium, Ion: 1.15 mmol/L (ref 1.15–1.40)
Calcium, Ion: 1.17 mmol/L (ref 1.15–1.40)
HCT: 32 % — ABNORMAL LOW (ref 39.0–52.0)
HCT: 27 % — ABNORMAL LOW (ref 39.0–52.0)
HCT: 29 % — ABNORMAL LOW (ref 39.0–52.0)
Hemoglobin: 10.9 g/dL — ABNORMAL LOW (ref 13.0–17.0)
Hemoglobin: 9.2 g/dL — ABNORMAL LOW (ref 13.0–17.0)
Hemoglobin: 9.9 g/dL — ABNORMAL LOW (ref 13.0–17.0)
O2 Saturation: 98 %
O2 Saturation: 100 %
O2 Saturation: 99 %
Patient temperature: 99
Patient temperature: 97.8
Patient temperature: 98.7
Potassium: 3.2 mmol/L — ABNORMAL LOW (ref 3.5–5.1)
Potassium: 3.4 mmol/L — ABNORMAL LOW (ref 3.5–5.1)
Potassium: 4.4 mmol/L (ref 3.5–5.1)
Sodium: 145 mmol/L (ref 135–145)
Sodium: 144 mmol/L (ref 135–145)
Sodium: 144 mmol/L (ref 135–145)
TCO2: 23 mmol/L (ref 22–32)
TCO2: 23 mmol/L (ref 22–32)
TCO2: 20 mmol/L — ABNORMAL LOW (ref 22–32)
pCO2 arterial: 28.7 mmHg — ABNORMAL LOW (ref 32.0–48.0)
pCO2 arterial: 27 mmHg — ABNORMAL LOW (ref 32.0–48.0)
pCO2 arterial: 28.4 mmHg — ABNORMAL LOW (ref 32.0–48.0)
pH, Arterial: 7.494 — ABNORMAL HIGH (ref 7.350–7.450)
pH, Arterial: 7.513 — ABNORMAL HIGH (ref 7.350–7.450)
pH, Arterial: 7.432 (ref 7.350–7.450)
pO2, Arterial: 95 mmHg (ref 83.0–108.0)
pO2, Arterial: 228 mmHg — ABNORMAL HIGH (ref 83.0–108.0)
pO2, Arterial: 111 mmHg — ABNORMAL HIGH (ref 83.0–108.0)

## 2019-01-25 LAB — CBC
HCT: 30.9 % — ABNORMAL LOW (ref 39.0–52.0)
Hemoglobin: 10 g/dL — ABNORMAL LOW (ref 13.0–17.0)
MCH: 29.3 pg (ref 26.0–34.0)
MCHC: 32.4 g/dL (ref 30.0–36.0)
MCV: 90.6 fL (ref 80.0–100.0)
Platelets: 233 10*3/uL (ref 150–400)
RBC: 3.41 MIL/uL — ABNORMAL LOW (ref 4.22–5.81)
RDW: 14.6 % (ref 11.5–15.5)
WBC: 35.1 10*3/uL — ABNORMAL HIGH (ref 4.0–10.5)
nRBC: 0 % (ref 0.0–0.2)

## 2019-01-25 LAB — COOXEMETRY PANEL
Carboxyhemoglobin: 0.8 % (ref 0.5–1.5)
Methemoglobin: 0.8 % (ref 0.0–1.5)
O2 Saturation: 58 %
Total hemoglobin: 13.5 g/dL (ref 12.0–16.0)

## 2019-01-25 LAB — BASIC METABOLIC PANEL
Anion gap: 11 (ref 5–15)
BUN: 61 mg/dL — ABNORMAL HIGH (ref 8–23)
CO2: 22 mmol/L (ref 22–32)
Calcium: 7.9 mg/dL — ABNORMAL LOW (ref 8.9–10.3)
Chloride: 113 mmol/L — ABNORMAL HIGH (ref 98–111)
Creatinine, Ser: 2.29 mg/dL — ABNORMAL HIGH (ref 0.61–1.24)
GFR calc Af Amer: 30 mL/min — ABNORMAL LOW (ref >60)
GFR calc non Af Amer: 26 mL/min — ABNORMAL LOW (ref >60)
Glucose, Bld: 94 mg/dL (ref 70–99)
Potassium: 3.4 mmol/L — ABNORMAL LOW (ref 3.5–5.1)
Sodium: 146 mmol/L — ABNORMAL HIGH (ref 135–145)

## 2019-01-25 LAB — TRIGLYCERIDES: Triglycerides: 110 mg/dL (ref <150)

## 2019-01-25 LAB — POCT ACTIVATED CLOTTING TIME: Activated Clotting Time: 180 seconds

## 2019-01-25 SURGERY — IR WITH ANESTHESIA
Anesthesia: General

## 2019-01-25 MED ORDER — CLOPIDOGREL BISULFATE 75 MG PO TABS
75.0000 mg | ORAL_TABLET | Freq: Every day | ORAL | Status: DC
  Administered 2019-01-25 – 2019-01-30 (×6): 75 mg
  Filled 2019-01-25 (×6): qty 1

## 2019-01-25 MED ORDER — NITROGLYCERIN 1 MG/10 ML FOR IR/CATH LAB
INTRA_ARTERIAL | Status: AC
  Filled 2019-01-25: qty 10

## 2019-01-25 MED ORDER — PREDNISONE 10 MG PO TABS: 10.0000 mg | ORAL_TABLET | Freq: Every day | ORAL | Status: DC

## 2019-01-25 MED ORDER — ASPIRIN 81 MG PO CHEW
81.0000 mg | CHEWABLE_TABLET | Freq: Every day | ORAL | Status: DC
  Administered 2019-01-25 – 2019-01-30 (×6): 81 mg
  Filled 2019-01-25 (×6): qty 1

## 2019-01-25 MED ORDER — CHLORHEXIDINE GLUCONATE 0.12 % MT SOLN
OROMUCOSAL | Status: AC
  Filled 2019-01-25: qty 15

## 2019-01-25 MED ORDER — CLOPIDOGREL BISULFATE 300 MG PO TABS
ORAL_TABLET | ORAL | Status: AC
  Filled 2019-01-25: qty 1

## 2019-01-25 MED ORDER — PYRIDOSTIGMINE BROMIDE 60 MG PO TABS
60.0000 mg | ORAL_TABLET | Freq: Three times a day (TID) | ORAL | Status: DC
  Administered 2019-01-25 – 2019-01-30 (×16): 60 mg
  Filled 2019-01-25 (×18): qty 1

## 2019-01-25 MED ORDER — NOREPINEPHRINE BITARTRATE 1 MG/ML IV SOLN: INTRAVENOUS | Status: DC | PRN

## 2019-01-25 MED ORDER — LORAZEPAM 2 MG/ML IJ SOLN
2.0000 mg | Freq: Once | INTRAMUSCULAR | Status: AC
  Administered 2019-01-25: 2 mg via INTRAVENOUS

## 2019-01-25 MED ORDER — SENNOSIDES 8.8 MG/5ML PO SYRP
5.0000 mL | ORAL_SOLUTION | Freq: Two times a day (BID) | ORAL | Status: DC
  Administered 2019-01-25 – 2019-01-28 (×4): 5 mL
  Filled 2019-01-25 (×4): qty 5

## 2019-01-25 MED ORDER — EPHEDRINE SULFATE-NACL 50-0.9 MG/10ML-% IV SOSY
PREFILLED_SYRINGE | INTRAVENOUS | Status: DC | PRN
  Administered 2019-01-25: 5 mg via INTRAVENOUS
  Administered 2019-01-25: 10 mg via INTRAVENOUS

## 2019-01-25 MED ORDER — GLYCOPYRROLATE PF 0.2 MG/ML IJ SOSY
PREFILLED_SYRINGE | INTRAMUSCULAR | Status: DC | PRN
  Administered 2019-01-25: .2 mg via INTRAVENOUS

## 2019-01-25 MED ORDER — NOREPINEPHRINE 4 MG/250ML-% IV SOLN
INTRAVENOUS | Status: AC
  Filled 2019-01-25: qty 250

## 2019-01-25 MED ORDER — ASPIRIN 81 MG PO CHEW
CHEWABLE_TABLET | ORAL | Status: AC
  Filled 2019-01-25: qty 1

## 2019-01-25 MED ORDER — SODIUM CHLORIDE 0.9 % IV SOLN: INTRAVENOUS | Status: DC

## 2019-01-25 MED ORDER — POTASSIUM CHLORIDE 10 MEQ/50ML IV SOLN
10.0000 meq | INTRAVENOUS | Status: AC
  Administered 2019-01-25 (×4): 10 meq via INTRAVENOUS
  Filled 2019-01-25 (×4): qty 50

## 2019-01-25 MED ORDER — NOREPINEPHRINE 4 MG/250ML-% IV SOLN
0.0000 ug/min | INTRAVENOUS | Status: AC
  Administered 2019-01-25: 3 ug/min via INTRAVENOUS
  Filled 2019-01-25: qty 250

## 2019-01-25 MED ORDER — ASPIRIN 81 MG PO CHEW: 81.0000 mg | CHEWABLE_TABLET | Freq: Every day | ORAL | Status: DC

## 2019-01-25 MED ORDER — ATORVASTATIN CALCIUM 80 MG PO TABS
80.0000 mg | ORAL_TABLET | Freq: Every day | ORAL | Status: DC
  Administered 2019-01-25 – 2019-01-29 (×5): 80 mg
  Filled 2019-01-25 (×5): qty 1

## 2019-01-25 MED ORDER — VITAMIN D 25 MCG (1000 UNIT) PO TABS
1000.0000 [IU] | ORAL_TABLET | Freq: Every day | ORAL | Status: DC
  Administered 2019-01-25 – 2019-01-30 (×6): 1000 [IU]
  Filled 2019-01-25 (×6): qty 1

## 2019-01-25 MED ORDER — VANCOMYCIN HCL IN DEXTROSE 1-5 GM/200ML-% IV SOLN
INTRAVENOUS | Status: AC
  Filled 2019-01-25: qty 200

## 2019-01-25 MED ORDER — PREDNISONE 10 MG PO TABS
10.0000 mg | ORAL_TABLET | Freq: Every day | ORAL | Status: DC
  Administered 2019-01-25 – 2019-01-30 (×6): 10 mg
  Filled 2019-01-25 (×7): qty 1

## 2019-01-25 MED ORDER — IOHEXOL 300 MG/ML  SOLN
150.0000 mL | Freq: Once | INTRAMUSCULAR | Status: AC | PRN
  Administered 2019-01-25: 75 mL via INTRA_ARTERIAL

## 2019-01-25 MED ORDER — PHENYLEPHRINE HCL-NACL 10-0.9 MG/250ML-% IV SOLN
INTRAVENOUS | Status: DC | PRN
  Administered 2019-01-25: 25 ug/min via INTRAVENOUS

## 2019-01-25 MED ORDER — DEXMEDETOMIDINE HCL IN NACL 400 MCG/100ML IV SOLN
0.4000 ug/kg/h | INTRAVENOUS | Status: DC
  Administered 2019-01-25: 0.2 ug/kg/h via INTRAVENOUS
  Administered 2019-01-25: 0.4 ug/kg/h via INTRAVENOUS
  Filled 2019-01-25 (×2): qty 100

## 2019-01-25 MED ORDER — HEPARIN SODIUM (PORCINE) 1000 UNIT/ML IJ SOLN
INTRAMUSCULAR | Status: DC | PRN
  Administered 2019-01-25: 2000 [IU] via INTRAVENOUS

## 2019-01-25 MED ORDER — ETOMIDATE 2 MG/ML IV SOLN
INTRAVENOUS | Status: DC | PRN
  Administered 2019-01-25: 10 mg via INTRAVENOUS

## 2019-01-25 MED ORDER — FENTANYL 2500MCG IN NS 250ML (10MCG/ML) PREMIX INFUSION
0.0000 ug/h | INTRAVENOUS | Status: DC
  Administered 2019-01-25: 25 ug/h via INTRAVENOUS
  Administered 2019-01-26: 75 ug/h via INTRAVENOUS
  Administered 2019-01-28 – 2019-01-30 (×2): 100 ug/h via INTRAVENOUS
  Administered 2019-01-31: 200 ug/h via INTRAVENOUS
  Filled 2019-01-25 (×7): qty 250

## 2019-01-25 MED ORDER — PHENYLEPHRINE 40 MCG/ML (10ML) SYRINGE FOR IV PUSH (FOR BLOOD PRESSURE SUPPORT)
PREFILLED_SYRINGE | INTRAVENOUS | Status: DC | PRN
  Administered 2019-01-25 (×3): 120 ug via INTRAVENOUS

## 2019-01-25 MED ORDER — PYRIDOSTIGMINE BROMIDE 60 MG PO TABS: 60.0000 mg | ORAL_TABLET | Freq: Three times a day (TID) | ORAL | Status: DC

## 2019-01-25 MED ORDER — EPTIFIBATIDE 20 MG/10ML IV SOLN
INTRAVENOUS | Status: AC
  Filled 2019-01-25: qty 10

## 2019-01-25 MED ORDER — VANCOMYCIN HCL 1000 MG IV SOLR
INTRAVENOUS | Status: DC | PRN
  Administered 2019-01-25: 1000 mg via INTRAVENOUS

## 2019-01-25 MED ORDER — SENNOSIDES-DOCUSATE SODIUM 8.6-50 MG PO TABS: 2.0000 | ORAL_TABLET | Freq: Two times a day (BID) | ORAL | Status: DC

## 2019-01-25 MED ORDER — LIDOCAINE HCL (CARDIAC) PF 100 MG/5ML IV SOSY
PREFILLED_SYRINGE | INTRAVENOUS | Status: DC | PRN
  Administered 2019-01-25: 40 mg via INTRATRACHEAL

## 2019-01-25 MED ORDER — PROPOFOL 10 MG/ML IV BOLUS
INTRAVENOUS | Status: DC | PRN
  Administered 2019-01-25: 20 mg via INTRAVENOUS

## 2019-01-25 MED ORDER — SODIUM CHLORIDE 0.9 % IV SOLN
INTRAVENOUS | Status: AC
  Administered 2019-01-25: 11:00:00 via INTRAVENOUS

## 2019-01-25 MED ORDER — VITAMIN D 25 MCG (1000 UNIT) PO TABS: 1000.0000 [IU] | ORAL_TABLET | Freq: Every day | ORAL | Status: DC

## 2019-01-25 MED ORDER — EPINEPHRINE HCL 5 MG/250ML IV SOLN IN NS
0.5000 ug/min | INTRAVENOUS | Status: DC
  Filled 2019-01-25: qty 250

## 2019-01-25 MED ORDER — PROPOFOL 1000 MG/100ML IV EMUL
5.0000 ug/kg/min | INTRAVENOUS | Status: DC
  Administered 2019-01-25: 40 ug/kg/min via INTRAVENOUS

## 2019-01-25 MED ORDER — DEXAMETHASONE SODIUM PHOSPHATE 10 MG/ML IJ SOLN
INTRAMUSCULAR | Status: DC | PRN
  Administered 2019-01-25: 10 mg via INTRAVENOUS

## 2019-01-25 MED ORDER — SUCCINYLCHOLINE 20MG/ML (10ML) SYRINGE FOR MEDFUSION PUMP - OPTIME
INTRAMUSCULAR | Status: DC | PRN
  Administered 2019-01-25: 140 mg via INTRAVENOUS

## 2019-01-25 MED ORDER — LORAZEPAM 2 MG/ML IJ SOLN
INTRAMUSCULAR | Status: AC
  Administered 2019-01-25: 05:00:00
  Filled 2019-01-25: qty 1

## 2019-01-25 MED ORDER — SENNOSIDES 8.8 MG/5ML PO SYRP: 5.0000 mL | ORAL_SOLUTION | Freq: Two times a day (BID) | ORAL | Status: DC

## 2019-01-25 MED ORDER — LORAZEPAM 2 MG/ML IJ SOLN
1.0000 mg | Freq: Once | INTRAMUSCULAR | Status: AC
  Administered 2019-01-25: 2 mg via INTRAVENOUS
  Filled 2019-01-25: qty 1

## 2019-01-25 MED ORDER — NOREPINEPHRINE 4 MG/250ML-% IV SOLN
0.0000 ug/min | INTRAVENOUS | Status: AC
  Filled 2019-01-25: qty 250

## 2019-01-25 MED ORDER — TIROFIBAN HCL IN NACL 5-0.9 MG/100ML-% IV SOLN
INTRAVENOUS | Status: AC
  Filled 2019-01-25: qty 100

## 2019-01-25 MED ORDER — TICAGRELOR 90 MG PO TABS
ORAL_TABLET | ORAL | Status: AC
  Filled 2019-01-25: qty 2

## 2019-01-25 MED ORDER — PROPOFOL 500 MG/50ML IV EMUL
INTRAVENOUS | Status: DC | PRN
  Administered 2019-01-25: 50 ug/kg/min via INTRAVENOUS

## 2019-01-25 MED ORDER — NOREPINEPHRINE 4 MG/250ML-% IV SOLN
0.0000 ug/min | INTRAVENOUS | Status: DC
  Administered 2019-01-25: 6 ug/min via INTRAVENOUS
  Administered 2019-01-25: 3 ug/min via INTRAVENOUS
  Administered 2019-01-26: 5 ug/min via INTRAVENOUS
  Filled 2019-01-25: qty 250

## 2019-01-25 MED ORDER — DOCUSATE SODIUM 50 MG/5ML PO LIQD
50.0000 mg | Freq: Two times a day (BID) | ORAL | Status: DC
  Administered 2019-01-25 – 2019-01-28 (×4): 50 mg
  Filled 2019-01-25 (×4): qty 10

## 2019-01-25 MED ORDER — CLOPIDOGREL BISULFATE 75 MG PO TABS: 75.0000 mg | ORAL_TABLET | Freq: Every day | ORAL | Status: DC

## 2019-01-25 MED ORDER — IOHEXOL 300 MG/ML  SOLN
150.0000 mL | Freq: Once | INTRAMUSCULAR | Status: AC | PRN
  Administered 2019-01-25: 08:00:00 20 mL via INTRA_ARTERIAL

## 2019-01-25 NOTE — Procedures (Signed)
S/P bilateral common carotid ,RT VA and LT subclavian arteriograms. RT CFA approach  Findings. 1.Occluded Lt VA at origin with no distal reconstitution. 2.Widely patent basilar artery. S.Evone Arseneau MD

## 2019-01-25 NOTE — Progress Notes (Signed)
Report given to Leory Plowman, RN, [place].

## 2019-01-25 NOTE — Progress Notes (Addendum)
Nutrition Follow up  DOCUMENTATION CODES:   Not applicable  INTERVENTION:   Tube feeding:  -Vital AF 1.2 @ 50 ml/hr via OGT  -30 ml Prostat BID  Provides: 1640 kcals, 120 grams protein, 973 ml free water.   NUTRITION DIAGNOSIS:   Increased nutrient needs related to acute illness as evidenced by estimated needs.  Ongoing  GOAL:   Patient will meet greater than or equal to 90% of their needs   Not meeting   MONITOR:   Diet advancement, Vent status, Skin, TF tolerance, Weight trends, Labs, I & O's  REASON FOR ASSESSMENT:   Ventilator    ASSESSMENT:   Patient with PMH significant for myasthenia gravis, HTN, prostate cancer, and HLD. Presents this admission with sepsis in setting of recent e coli bacteremia and possible cholecystitis.   11/22- intubated  11/26- extubated   Pt discussed during ICU rounds and with RN.   Code stroke called yesterday, re-intubated. Found to have acute/subacute nonhemorrhagic infracts involving cerebellum. Palliative had family meeting this afternoon-no decision made. Plan follow up tomorrow. Will see if we can start feeding- awaiting CCM response.   Admission weight: 87.1 kg  Current weight: 78 kg   Patient is currently intubated on ventilator support MV: 9.1 L/min Temp (24hrs), Avg:98.1 F (36.7 C), Min:97 F (36.1 C), Max:98.7 F (37.1 C)   I/O: +154 ml since admit UOP: 1,445 ml x 24 hrs  Stool: 600 ml x 24 hrs   Drips: levophed, precedex  Medications: Vit D, prednisone, senokot  Labs: Cr 2.29- plateau CBG 85-162  Diet Order:   Diet Order            Diet NPO time specified  Diet effective now              EDUCATION NEEDS:   Not appropriate for education at this time  Skin:  Skin Assessment: Reviewed RN Assessment  Last BM:  11/30  Height:   Ht Readings from Last 1 Encounters:  01/19/2019 5\' 8"  (1.727 m)    Weight:   Wt Readings from Last 1 Encounters:  01/02/2019 78 kg    Ideal Body Weight:  70  kg  BMI:  Body mass index is 26.15 kg/m.  Estimated Nutritional Needs:   Kcal:  1583 kcal  Protein:  120-140 grams  Fluid:  >/= 1.5 L/day   Mariana Single RD, LDN Clinical Nutrition Pager # - [phone]

## 2019-01-25 NOTE — Anesthesia Postprocedure Evaluation (Signed)
Anesthesia Post Note  Patient: Duane Burns  Procedure(s) Performed: IR WITH ANESTHESIA (N/A )     Patient location during evaluation: ICU Anesthesia Type: General Level of consciousness: sedated and patient remains intubated per anesthesia plan Pain management: pain level controlled Vital Signs Assessment: post-procedure vital signs reviewed and stable Respiratory status: patient remains intubated per anesthesia plan Anesthetic complications: no    Last Vitals:  Vitals:   01/24/2019 0600 01/05/2019 0830  BP: 113/73   Pulse: (!) 25   Resp: (!) 29   Temp:    SpO2: 93% 99%    Last Pain:  Vitals:   01/06/2019 0400  TempSrc: Axillary  PainSc:                  Nolon Nations

## 2019-01-25 NOTE — Progress Notes (Signed)
SLP Cancellation Note  Patient Details Name: Duane Burns MRN: [id number] DOB: September 11, 1935   Cancelled treatment:       Reason Eval/Treat Not Completed: Medical issues which prohibited therapy; acute CVA.  Now intubated. SLP will follow along.   Leroi Haque L. Tivis Ringer, [place] CCC/SLP Acute Rehabilitation Services Office number [phone] Pager [phone]    Juan Quam Laurice 01/18/2019, 1:02 PM

## 2019-01-25 NOTE — Anesthesia Procedure Notes (Signed)
Procedure Name: Intubation Date/Time: 01/22/2019 6:33 AM Performed by: Valetta Fuller, CRNA Pre-anesthesia Checklist: Patient identified, Emergency Drugs available, Suction available and Patient being monitored Patient Re-evaluated:Patient Re-evaluated prior to induction Oxygen Delivery Method: Circle system utilized Preoxygenation: Pre-oxygenation with 100% oxygen Induction Type: IV induction and Rapid sequence Laryngoscope Size: Miller and 2 Grade View: Grade I Tube type: Oral Tube size: 8.0 mm Number of attempts: 1 Airway Equipment and Method: Stylet Placement Confirmation: ETT inserted through vocal cords under direct vision,  positive ETCO2 and breath sounds checked- equal and bilateral Secured at: 23 cm Tube secured with: Tape Dental Injury: Teeth and Oropharynx as per pre-operative assessment

## 2019-01-25 NOTE — Progress Notes (Signed)
PT Cancellation Note  Patient Details Name: Duane Burns MRN: [id number] DOB: 09-13-1935   Cancelled Treatment:    Reason Eval/Treat Not Completed: Patient not medically ready. Pt with new stroke, currently intubated and sedated. Will follow-up for PT treatment as appropriate.  Mabeline Caras, PT, DPT Acute Rehabilitation Services  Pager [phone] Office [place] 01/08/2019, 12:55 PM

## 2019-01-25 NOTE — Progress Notes (Signed)
Report received from Desiree Hane, Therapist, sports.

## 2019-01-25 NOTE — Anesthesia Preprocedure Evaluation (Signed)
Anesthesia Evaluation  Patient identified by MRN, date of birth, ID band Patient unresponsive    Reviewed: Patient's Chart, lab work & pertinent test results, Unable to perform ROS - Chart review only  Airway Mallampati: II     Mouth opening: Limited Mouth Opening  Dental  (+) Teeth Intact   Pulmonary former smoker,     + decreased breath sounds      Cardiovascular hypertension,  Rhythm:Irregular Rate:Normal + Systolic murmurs    Neuro/Psych    GI/Hepatic   Endo/Other    Renal/GU      Musculoskeletal   Abdominal   Peds  Hematology   Anesthesia Other Findings   Reproductive/Obstetrics                             Anesthesia Physical Anesthesia Plan  ASA: IV and emergent  Anesthesia Plan: General   Post-op Pain Management:    Induction: Intravenous  PONV Risk Score and Plan: Ondansetron and Dexamethasone  Airway Management Planned: Oral ETT  Additional Equipment: Arterial line  Intra-op Plan:   Post-operative Plan: Post-operative intubation/ventilation  Informed Consent: I have reviewed the patients History and Physical, chart, labs and discussed the procedure including the risks, benefits and alternatives for the proposed anesthesia with the patient or authorized representative who has indicated his/her understanding and acceptance.       Plan Discussed with: CRNA and Anesthesiologist  Anesthesia Plan Comments:         Anesthesia Quick Evaluation

## 2019-01-25 NOTE — Progress Notes (Signed)
PCCM progress:  Paged to see patient as he suddenly became unresponsive overnight, per RN was awake and responsive earlier in the shift.  BP and oxygen stable.  On exam, pt did not respond to sternal rub, jaw was clenched and eyes bilaterally deviated upwards.  Glucose 101.  Suspected seizure so gave Ativan 2mg  IVx1 with rapid improvement.  Pt now following commands.  Code stroke was activated head CT pending and Neurology evaluating.

## 2019-01-25 NOTE — Progress Notes (Addendum)
Progress Note  Patient Name: Duane Burns Date of Encounter: 01/13/2019  Primary Cardiologist: Kate Sable, MD   Subjective   Extubated 11/26. Remains confused but seems to be improving. Off NE. Co-ox stable at 57%  Developed drug rash after switching unasyn to zosyn. Now back on cefipime. Rash improved  Received IVF yesterday. Sodium 153 -> 151>145 . K 3.2. Afebrile but WBC 31K yesterday, not drawn yet today.   Unresponsive overnight, given ativan with resolution of symptoms but severe dysarthria. Reintubated for airway protection.  Stat CT scan showed non-hemorrhagic cerebellar infarcts L>R with possible basilar thrombus. MRA/MRI additionally showed severe left veretebral stenosis and right temporal infarct.  Previous basilar thrombus unclear, taken for angiogram. Not given tpa due to recent strokes.   CVP 4. Hypotensive on NE & propofol. NS ordered per radiology, no IVF running.   Inpatient Medications    Scheduled Meds:  aspirin       aspirin  81 mg Oral Daily   atorvastatin  80 mg Oral q1800   chlorhexidine gluconate (MEDLINE KIT)  15 mL Mouth Rinse BID   Chlorhexidine Gluconate Cloth  6 each Topical Daily   cholecalciferol  1,000 Units Oral Daily   clopidogrel       clopidogrel  75 mg Oral Daily   eptifibatide       Gerhardt's butt cream   Topical BID   heparin injection (subcutaneous)  5,000 Units Subcutaneous Q8H   LORazepam       LORazepam  2 mg Intravenous Once   multivitamin with minerals  1 tablet Oral Daily   nitroGLYCERIN       predniSONE  10 mg Oral Q breakfast   pyridostigmine  60 mg Oral TID   senna-docusate  2 tablet Oral BID   sodium chloride flush  10-40 mL Intracatheter Q12H   sodium chloride flush  3 mL Intravenous Q12H   ticagrelor       Continuous Infusions:  sodium chloride 10 mL/hr at 01/24/19 2234   sodium chloride     ceFEPime (MAXIPIME) IV 200 mL/hr at 01/24/19 0900   famotidine (PEPCID) IV Stopped  (01/24/19 2237)   tirofiban     PRN Meds: sodium chloride, acetaminophen **OR** acetaminophen, bisacodyl, diphenhydrAMINE, fentaNYL (SUBLIMAZE) injection, guaiFENesin, haloperidol lactate, iohexol, levalbuterol, midazolam, ondansetron **OR** ondansetron (ZOFRAN) IV, polyethylene glycol, sennosides, sodium chloride flush, sodium chloride flush, traZODone   Vital Signs    Vitals:   01/14/2019 0300 01/06/2019 0400 01/24/2019 0500 12/31/2018 0600  BP: 100/68 (!) 113/51 111/78 113/73  Pulse: 93 98 99 (!) 25  Resp: (!) 25 (!) 35 (!) 31 (!) 29  Temp:      TempSrc:  Axillary    SpO2: 100% 100% 100% 93%  Weight:      Height:        Intake/Output Summary (Last 24 hours) at 01/14/2019 0618 Last data filed at 01/02/2019 0200 Gross per 24 hour  Intake 1273.34 ml  Output 2095 ml  Net -821.66 ml   Filed Weights   01/23/19 0500 01/24/19 0331 01/18/2019 0205  Weight: 78.5 kg 76.9 kg 78 kg    Telemetry    Sinus tach with frequent PVCs 90-100 Personally reviewed  Physical Exam   General:  Elderly, frail, acutely ill appearing, sedated HEENT: ETT in place  Neck: supple. no JVD. Carotids 2+ bilat; no bruits. No lymphadenopathy or thryomegaly appreciated. Cor: PMI nondisplaced. Regular rate, irregular rhythm. 2/6 AS Lungs: CTAB  Abdomen: soft, no reaction to palpation. No  hepatosplenomegaly. No bruits or masses. Good bowel sounds. Extremities: no cyanosis, clubbing, rash, edema Neuro: Sedated    Labs    Chemistry Recent Labs  Lab 01/19/19 0345  01/23/19 0412 01/24/19 0330 01/24/19 1000 01/19/2019 0231 01/04/2019 0429  NA 144   < > 153* 151*  --  146* 145  K 3.4*   < > 3.3* 3.3* 3.6 3.4* 3.2*  CL 109   < > 114* 117*  --  113*  --   CO2 22   < > 24 24  --  22  --   GLUCOSE 84   < > 89 118*  --  94  --   BUN 57*   < > 72* 69*  --  61*  --   CREATININE 2.87*   < > 2.07* 2.38*  --  2.29*  --   CALCIUM 7.9*   < > 8.2* 8.1*  --  7.9*  --   PROT 5.1*  --   --   --   --   --   --   ALBUMIN  2.4*  --   --   --   --   --   --   AST 78*  --   --   --   --   --   --   ALT 88*  --   --   --   --   --   --   ALKPHOS 82  --   --   --   --   --   --   BILITOT 2.5*  --   --   --   --   --   --   GFRNONAA 20*   < > 29* 25*  --  26*  --   GFRAA 23*   < > 34* 29*  --  30*  --   ANIONGAP 13   < > 15 10  --  11  --    < > = values in this interval not displayed.     Hematology Recent Labs  Lab 01/22/19 0440 01/23/19 0412 01/24/19 0330 01/10/2019 0429  WBC 31.3* 29.5* 30.8*  --   RBC 3.84* 3.92* 3.51*  --   HGB 11.3* 11.5* 10.3* 10.9*  HCT 35.2* 35.4* 31.6* 32.0*  MCV 91.7 90.3 90.0  --   MCH 29.4 29.3 29.3  --   MCHC 32.1 32.5 32.6  --   RDW 14.2 14.3 14.6  --   PLT 158 150 167  --     Cardiac EnzymesNo results for input(s): TROPONINI in the last 168 hours. No results for input(s): TROPIPOC in the last 168 hours.   BNP No results for input(s): BNP, PROBNP in the last 168 hours.   DDimer No results for input(s): DDIMER in the last 168 hours.   Radiology    Dg Chest Port 1 View  Result Date: 01/24/2019 CLINICAL DATA:  Respiratory failure, code sepsis. EXAM: PORTABLE CHEST 1 VIEW COMPARISON:  Chest x-rays dated 01/23/2019 FINDINGS: RIGHT IJ central line appears well positioned with tip at the level of the lower SVC/cavoatrial junction. Heart size and mediastinal contours are stable. Probable mild bibasilar atelectasis. Mild central pulmonary vascular congestion. Stable elevation of the RIGHT hemidiaphragm. IMPRESSION: 1. Mild central pulmonary vascular congestion suggesting mild CHF/volume overload. No evidence of pneumonia or overt alveolar pulmonary edema. 2. Probable mild bibasilar atelectasis. Electronically Signed   By: Franki Cabot M.D.   On: 01/24/2019 06:19   Dg  Chest Port 1 View  Result Date: 01/23/2019 CLINICAL DATA:  Cough for 2 days EXAM: PORTABLE CHEST 1 VIEW COMPARISON:  Chest radiograph 01/23/2019 FINDINGS: Right IJ central venous catheter tip projects over  the superior vena cava. Monitoring leads overlie the patient. Stable cardiac and mediastinal contours. Aortic atherosclerosis. Elevation right hemidiaphragm. Bilateral interstitial pulmonary opacities. No pleural effusion or pneumothorax. IMPRESSION: Mild interstitial opacities bilaterally may represent edema. Elevation right hemidiaphragm. Electronically Signed   By: Lovey Newcomer M.D.   On: 01/23/2019 18:00   Ct Head Code Stroke Wo Contrast  Result Date: 01/18/2019 CLINICAL DATA:  Code stroke. Focal neuro deficit of less than 6 hours. Stroke suspected. EXAM: CT HEAD WITHOUT CONTRAST TECHNIQUE: Contiguous axial images were obtained from the base of the skull through the vertex without intravenous contrast. COMPARISON:  CT head without contrast 12/14/2018 FINDINGS: Brain: Bilateral nonhemorrhagic superior cerebellar infarcts are new since the prior exam, more prominent left than right. No acute supratentorial lesion is present. Mild atrophy and white matter changes are stable, within normal limits for age. The ventricles are of normal size. No significant extraaxial fluid collection is present. Vascular: Atherosclerotic calcifications are present within the cavernous internal carotid arteries and at the dural margin of both vertebral arteries. There is no hyperdense vessel. Skull: Atherosclerotic calcifications are present within the cavernous internal carotid arteries and at the dural margin the vertebral arteries bilaterally. There is mild density in the mid basilar artery and at the basilar tip raising concern for thrombus. Sinuses/Orbits: Calvarium is intact. No focal lytic or blastic lesions are present. Other: The paranasal sinuses and mastoid air cells are clear. Bilateral lens replacements are noted. Globes and orbits are otherwise unremarkable. ASPECTS [facility] Stroke Program Early CT Score) - Ganglionic level infarction (caudate, lentiform nuclei, internal capsule, insula, M1-M3 cortex): 7/7 -  Supraganglionic infarction (M4-M6 cortex): 3/3 Total score (0-10 with 10 being normal): 10/10 IMPRESSION: 1. 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 Increased density of the basilar artery raises concern for thrombus. Recommend CTA of the head and neck for further evaluation 3. ASPECTS is 10/10 The above was relayed via text pager to Dr. Samara Snide on 12/31/2018 at 04:58 . Electronically Signed   By: San Morelle M.D.   On: 12/29/2018 04:59    Cardiac Studies   Echo 11/21  1. Left ventricular ejection fraction, by visual estimation, is 20 to 25%. The left ventricle has severely decreased function. There is no left ventricular hypertrophy.  2. Diffuse hypokinesis with most prominent severe hypokinesis of the anteroseptal wall and the entire apex.  3. Global right ventricle was not assessed.The right ventricular size is not assessed. Right vetricular wall thickness was not assessed.  4. Left atrial size was not assessed.  5. Right atrial size was not assessed.  6. The pericardium was not assessed.  7. The mitral valve was not assessed. not assessed mitral valve regurgitation.  8. The tricuspid valve is not assessed. Tricuspid valve regurgitation not assessed.  9. Aortic valve regurgitation Not assessed. 10. The aortic valve was not assessed. Aortic valve regurgitation Not assessed. 11. The pulmonic valve was not assessed. Pulmonic valve regurgitation Not assessed. 12. Not assessed. 13. The interatrial septum was not assessed.  ECHO 11/20 12/2018 LEFT VENTRICLE PLAX 2D LVIDd: 4.17 cm LVIDs: 2.50 cm LV PW: 1.26 cm LV IVS: 1.30 cm LVOT diam: 1.80 cm LV SV: 55 ml LV SV Index: 26.59 LVOT Area: 2.54 cm  06/2018 +------------------+------------++  AORTIC VALVE      +------------------+------------++  AV Area (Vmax):  0.88 cm     +------------------+------------++  AV Area (Vmean):  0.89 cm    +------------------+------------++  AV Area (VTI):  0.90 cm    +------------------+------------++  AV Vmax:  372.81 cm/s    +------------------+------------++  AV Vmean:  255.916 cm/s   +------------------+------------++  AV VTI:  0.923 m    +------------------+------------++  AV Peak Grad:  55.6 mmHg    +------------------+------------++  AV Mean Grad:  30.9 mmHg    +------------------+------------++  LVOT Vmax:  86.15 cm/s    +------------------+------------++  LVOT Vmean:  60.100 cm/s    +------------------+------------++  LVOT VTI:  0.220 m    +------------------+------------++  LVOT/AV VTI ratio: 0.24    +------------------+------------++  AR PHT:  779 msec    +------------------+------------++    Patient Profile     Duane Burns is a 83yo male with PMH HTN, moderate to severe AS, myasthenia gravis, HLD, recent e. Coli bacteremia treated at [facility] (10/19-10/29) presenting with sepsis secondary to acute cholecystitis with acute HF and severe aortic stenosis.   Assessment & Plan    1. Shock - combined sepsis/cardiogenic  - PCT very high (67 11/23) - Sepsis now resolved. Initially felt to be cholecystitis but now felt less likely - Meropenem switched to Unasyn on 11/26. (vanc stopped). Switched to zosyn deu to salt load. Developed rash. (likely PCN related) Now back on cefipime. Cx negative.  - Will complete cefepime today,   - Had e. coli sepsis recently. So this is recurrent. Source unclear (GI vs GU) -> CCM managing - NE off 11/26 BP ok. Co-ox stable at 58%. Given additional [id number] at 40 for 12 hours yesteday - hypotensive now 2/2 propofol with reintubation and s/p thrombectomy. Improving on NE, Epinephrine gtt also ordered. MAP goal >65.  - WBC remains at 31K yesterday, today pending (seems high  for low-dose prednisone). Consider ID involvement, CCM managing  2. Acute Systolic HF -> cardiogenic shock - Echo newly down to 20-25% with severe AS from 60-65% with mod-severe AS on 06/2018. - hs Trop > 27K  - suspect combination of ischemic and septic CM - Volume status low. CVP 4, lasix was held yesterday with gentle IVF. NS ordered per radiology, not currently running. Discussed with nurse.  - hypotensive s/p thrombectomy, likely 2/2 propofol, requiring NE currently. Epi gtt also ordered.  - co-ox stable 58% prior to thrombectomy  - R&L cath once renal function recovers fully and condition permits. Cr elevated but stable compared to yesterday.   - Hopefully can start HF meds soon as BP and renal function permit - Stop spiro with AKI  - No ACE/ARB/b-blocker yet with hypotension and AKI  3. Acute hypoxic respiratory failure - due to HF and sepsis - extubated 11/26 > retintubated 11/30 2/2 acute CVA with severe dysarthria.  - CCM managing  4. Severe Aortic Stenosis - Worsened AS since May of this year. Will need R&L heart cath after he is stable and has improvement in renal function - eventual evaluation planned for TAVR pending medical improvement.   4. Posterior Stroke - Unresponsive o/n, resolution with ativan but severe dysarthria with return of consciousness - reintubated for airway protection - Stat CT showed acute subacute cerebellar infarcts L>R and possible basilar artery thrombus - stat MRI/MRA showed infarct to right temporal lobe, high grade stenosis non-dominant left vertebral artery but unclear if thrombus present in basilar artery  - taken for angiogram but not given tpa - findings pending.  - cont. Asa/plavix  6. AKI - due to shock/ATN - baseline creatinine 1.6, stable at 2.3 yesterday and today. Holding spiro.   7. Myasthenia gravis  - weaned off stress-dose steroids. Now back on home dose prednisone 77m daily  - watch for precipitation of MG crisis    8.  PAF - had ? AF overnight on 11/23 - now in NSR with frequent PVCs. Afib has not recurred  - Heparin stopped, on LMWH for DVT prophylaxis. Will start AC if AF recurs   9. New LBBB - suspect ischemic  10. Hypokalemia - K 3.3. Need to continue to supp .  11. Hypernatremia -  Improved, 145 today from 151 with IVF.   12. Acute delirium  - yesterday, 11/29 likely steroids/metabolic +/- sundowning. Now reintubated.  - CCM managing  13. Drug rash - 2/2 zosyn - resolved.      Signed, JMarty Heck DO  01/05/2019, 6:18 AM    Agree with above.  Events from overnight noted. He has a suffered an acute cerebellar infarct overnight. Angiography showed completely occluded left vertebral artery. Now intubated and sedated on propofol. On NE at 2-3 for BP support. Back on heparin. Creatinine stable at 2.3 Volume status ok.  General:  Intubated/sedated HEENT: normal + ETT Neck: supple. no JVD. Carotids 2+ bilat; no bruits. No lymphadenopathy or thryomegaly appreciated. Cor: PMI nondisplaced. Regular rate & rhythm. No rubs, gallops or murmurs. Lungs: clear Abdomen: soft, nontender, nondistended. No hepatosplenomegaly. No bruits or masses. Good bowel sounds. Extremities: no cyanosis, clubbing, rash, edema Neuro: intubated/sedated. Not responsive  Tele: sinus with PACs  Remains critically ill with acute cerebellar infarct. Now intubated and sedated. HF stable. Remains in NSR.   Will defer cardiac cath for forseeable future due to AKI and recent stroke. Will continue to follow along to adjust cardiac meds. Will need lifelong anticoagulation.   CRITICAL CARE Performed by: BGlori Bickers Total critical care time: 35 minutes  Critical care time was exclusive of separately billable procedures and treating other patients.  Critical care was necessary to treat or prevent imminent or life-threatening deterioration.  Critical care was time spent personally by me (independent of midlevel  providers or residents) on the following activities: development of treatment plan with patient and/or surrogate as well as nursing, discussions with consultants, evaluation of patient's response to treatment, examination of patient, obtaining history from patient or surrogate, ordering and performing treatments and interventions, ordering and review of laboratory studies, ordering and review of radiographic studies, pulse oximetry and re-evaluation of patient's condition.  DGlori Bickers MD  1:09 PM

## 2019-01-25 NOTE — TOC Progression Note (Deleted)
Transition of Care [facility]) - Progression Note    Patient Details  Name: Duane Burns MRN: [id number] Date of Birth: 04/21/1935  [facility] CM/SW Contact  Eileen Stanford, LCSW Phone Number: 01/22/2019, 11:32 AM  Clinical Narrative:            Expected Discharge Plan and Services                                                 Social Determinants of Health (SDOH) Interventions    Readmission Risk Interventions No flowsheet data found.

## 2019-01-25 NOTE — Consult Note (Addendum)
Requesting Physician: Dr. Valeta Harms    Chief Complaint: Sudden onset unresponsives  History obtained from: Patient and Chart    HPI:                                                                                                                                       Duane Burns is a 83 y.o. male with past medical history significant for hypertension, hyperlipidemia, severe aortic stenosis, myasthenia gravis versus Guillain-Barr admitted to [facility] for sepsis, acute renal failure as well as acute respiratory failure in the setting of pulmonary edema and possible pneumonia.  Also developed acute on chronic systolic heart failure with depressed ejection fraction of 20%.  Patient was extubated on 11/26 and was awake and alert until around 4:30 AM patient suddenly became unresponsive.  Nurse activated code stroke.  She describes this patient having disconjugate gaze, was clenching his jaws, flaccid weakness in all 4 extremities.  Critical care evaluated the patient and thought he may be having a seizure due to deviated eyes.  On my assessment shortly after this patient was awake and following commands however severely dysarthric.  Was raising both arms against gravity but not withdrawing in bilateral lower extremities.  A stat CT head was obtained which showed an acute well demarcated left cerebellar infarct as well as smaller infarcts in the right cerebellum.  CT head noncontrast was concerning for hyperdense sign in the basilar artery.  Due to patient's acute renal failure, to avoid contrast stat MRI and MRA was obtained, MRI brain we confirm stroke in the cerebellum.  MRA however was not conclusive of the basilar thrombus, however did show high-grade stenosis of the left vertebral artery but no clear large vessel occlusion, however this may be limited with motion.  Since the patient continued to remain lethargic and nonverbal and appeared to have difficulty protecting his airway and a weak gag  reflex ( although present) we decided to proceed with intubation as well as a diagnostic cerebral angiogram to evaluate for a top of the basilar artery occlusion that could be missed on MR Angiogram.  tPA was not administered due to recent infarcts.  Date last known well: 11.30.20 Time last known well: 4:15 AM tPA Given: No, recent infarcts noted on CT NIHSS: 18 ( (1, 2, 2,) 0, 0, 0, (1, 1,) ( 3,3,) 0,0 3, 2, 0 )  Baseline MRS 0  Past Medical History:  Diagnosis Date  . Arthritis   . Cancer [facility])    History of skin cancer / hx Prostate cancer  . Dysphagia 11/10/2014  . Dysphonia 11/11/2014  . Foot drop, left    DUE TO NERVE INJURY IN BACK PER PT  . HNP (herniated nucleus pulposus with myelopathy), thoracic    HISTORY OF HNP  . Hyperlipidemia   . Hypertension   . Myasthenia gravis ([place]) 11/12/2014   versus Guillain-Barr syndrome.  . Pneumonia  JAN 2016  . Urgency of urination   . Urinary leakage     Past Surgical History:  Procedure Laterality Date  . BACK SURGERY     X 3  . CYSTOSCOPY WITH URETHRAL DILATATION N/A 05/10/2014   Procedure: CYSTOSCOPY WITH URETHRAL DILATATION;  Surgeon: Alexis Frock, MD;  Location: [facility];  Service: Urology;  Laterality: N/A;  with catheter placement, prior to surgery. procedure performed on the stretcher.  Marland Kitchen PROSTATE SURGERY    . SKIN CANCER EXCISION    . TONSILLECTOMY    . TOTAL HIP ARTHROPLASTY Right 05/10/2014   Procedure: RIGHT TOTAL HIP ARTHROPLASTY ANTERIOR APPROACH ;  Surgeon: Paralee Cancel, MD;  Location: [facility];  Service: Orthopedics;  Laterality: Right;    Family History  Problem Relation Age of Onset  . Diabetes Other   . Hypertension Other    Social History:  reports that he quit smoking about 24 years ago. He has never used smokeless tobacco. He reports that he does not drink alcohol or use drugs.  Allergies:  Allergies  Allergen Reactions  . Penicillins Rash    Noted after change from Unasyn to Zosyn 01/22/19     Medications:                                                                                                                        I reviewed home medications.   ROS:                                                                                                                                     Unable to perform due to patient's mental status   Examination:                                                                                                      General: Appears well-developed  Psych: Affect appropriate to situation Eyes: No scleral injection HENT: No OP obstrucion Head: Normocephalic.  Cardiovascular: Normal rate and regular rhythm.  Respiratory: Effort normal and breath sounds normal to anterior ascultation GI: Soft.  No distension. There is no tenderness.  Skin: WDI    Neurological Examination Mental Status: Awake, arousable to sternal rub.  Does not track examiner.  Follows simple commands intermittently such as raising arms and squeezing hands.  Sticks out tongue minimally.  Speech severely dysarthric, stated his name 1 or 2 times but did not have any other relevant speech. Cranial Nerves: II: Visual fields grossly normal,  III,IV, VI: ptosis not present, no significant movement of his eyes noted, no forced gaze deviation seen.  Pupils equal, round, reactive to light and accommodation VII: Face appears symmetric VIII: Could not assess hearing IX,X: uvula rises symmetrically, gag reflex present but weak XI: Unable to assess XII: Weak movements in tongue Motor: Right : Upper extremity   4/5    Left:     Upper extremity   4/5  Lower extremity   1/5     Lower extremity   1/5 Tone and bulk:normal tone throughout; no atrophy noted Sensory: Grimaces to pain bilaterally  Plantars: Right: downgoing   Left: downgoing Cerebellar: Unable to assess due to weakness Gait: Unable to perform     Lab Results: Basic Metabolic Panel: Recent Labs  Lab 01/19/19 0345  01/19/19 1745  01/20/19 0408 01/20/19 1803 01/21/19 0400  01/21/19 1652 01/22/19 0440 01/23/19 0412 01/24/19 0330 01/24/19 1000 01/21/2019 0231 01/24/2019 0429  NA 144  --    < > 143  --  146*   < > 148* 152* 153* 151*  --  146* 145  K 3.4*  --    < > 4.0  --  3.0*   < > 3.4* 3.3* 3.3* 3.3* 3.6 3.4* 3.2*  CL 109  --   --  110  --  107  --  109 112* 114* 117*  --  113*  --   CO2 22  --   --  22  --  26  --  23 26 24 24   --  22  --   GLUCOSE 84  --   --  160*  --  161*  --  166* 92 89 118*  --  94  --   BUN 57*  --   --  64*  --  77*  --  79* 74* 72* 69*  --  61*  --   CREATININE 2.87*  --   --  2.23*  --  2.11*  --  2.10* 1.76* 2.07* 2.38*  --  2.29*  --   CALCIUM 7.9*  --   --  7.8*  --  8.0*  --  8.2* 8.4* 8.2* 8.1*  --  7.9*  --   MG 2.0  --   --   --   --  2.1  --   --  2.1 2.1 2.3  --   --   --   PHOS 3.3 3.4  --  3.8 4.1 4.1  --   --   --  3.8  --   --   --   --    < > = values in this interval not displayed.    CBC: Recent Labs  Lab 01/20/19 0408 01/21/19 0400 01/21/19 0509 01/22/19 0440 01/23/19 0412 01/24/19 0330 01/01/2019 0429  WBC 23.2* 29.9*  --  31.3* 29.5* 30.8*  --   NEUTROABS  --   --   --   --  26.8*  --   --  HGB 10.4* 10.8* 10.5* 11.3* 11.5* 10.3* 10.9*  HCT 32.1* 33.5* 31.0* 35.2* 35.4* 31.6* 32.0*  MCV 90.7 90.8  --  91.7 90.3 90.0  --   PLT 105* 148*  --  158 150 167  --     Coagulation Studies: No results for input(s): LABPROT, INR in the last 72 hours.  Imaging: Mr Angio Head Wo Contrast  Result Date: 01/14/2019 CLINICAL DATA:  Code stroke. Unresponsive. EXAM: MRI HEAD WITHOUT CONTRAST MRA HEAD WITHOUT CONTRAST TECHNIQUE: Multiplanar, multiecho pulse sequences of the brain and surrounding structures were obtained without intravenous contrast. Angiographic images of the head were obtained using MRA technique without contrast. COMPARISON:  CT head without contrast 01/17/2019 FINDINGS: MRI HEAD FINDINGS Brain: The diffusion-weighted images confirm  acute nonhemorrhagic infarcts of the cerebellum bilaterally, left greater than right. An additional right periventricular white matter nonhemorrhagic acute infarct is noted in the medial right temporal lobe. T2 FLAIR signal is associated with the areas of acute infarction. Mild periventricular T2 hyperintensities are present as well. Moderate generalized atrophy is noted. The ventricles are proportionate to the degree of atrophy. No significant extraaxial fluid collection is present. Brainstem is within normal limits. Vascular: Flow voids are present in the anterior circulation. Standard T2 sequences were not performed. Skull and upper cervical spine: Skull base is unremarkable. Sinuses/Orbits: The paranasal sinuses and mastoid air cells are clear. The globes and orbits are within normal limits. MRA HEAD FINDINGS Signal loss in the anterior genu of the cavernous internal carotid arteries bilaterally appears to be artifactual. No other focal stenosis is present in the internal carotid arteries from the high cervical segments through the ICA termini bilaterally. There is slight signal loss in the distal right A1 segment, likely artifactual. A1 and M1 vessels are otherwise within normal limits. MCA bifurcations are intact. Asymmetric attenuation of right MCA branches is noted. Fetal type posterior cerebral arteries are present bilaterally. The right posterior cerebral artery scratched at the right vertebral artery is the dominant vessel. There is no focal stenosis. Flow is present in the basilar artery. The superior cerebellar arteries are not visualized. There is irregular narrowing of the non dominant left vertebral artery with distal tapering suggesting high-grade stenosis or occlusion. IMPRESSION: 1. Acute/subacute nonhemorrhagic infarcts involving the cerebellum bilaterally, left greater than right. 2. Additional acute/subacute right periventricular white matter nonhemorrhagic infarct in the medial right temporal  lobe. 3. Moderate generalized atrophy and white matter disease likely reflects the sequela of chronic microvascular ischemia. 4. High-grade stenosis or occlusion of the non dominant left vertebral artery. 5. Fetal type posterior cerebral arteries bilaterally. 6. Flow is present in the basilar artery. Basilar artery terminates at the superior cerebellar arteries with fetal type posterior cerebral arteries bilaterally. 7. No significant stenosis within the dominant right vertebral artery. Electronically Signed   By: San Morelle M.D.   On: 01/23/2019 06:22   Mr Brain Wo Contrast  Result Date: 12/31/2018 CLINICAL DATA:  Code stroke. Unresponsive. EXAM: MRI HEAD WITHOUT CONTRAST MRA HEAD WITHOUT CONTRAST TECHNIQUE: Multiplanar, multiecho pulse sequences of the brain and surrounding structures were obtained without intravenous contrast. Angiographic images of the head were obtained using MRA technique without contrast. COMPARISON:  CT head without contrast 01/24/2019 FINDINGS: MRI HEAD FINDINGS Brain: The diffusion-weighted images confirm acute nonhemorrhagic infarcts of the cerebellum bilaterally, left greater than right. An additional right periventricular white matter nonhemorrhagic acute infarct is noted in the medial right temporal lobe. T2 FLAIR signal is associated with the areas of acute infarction.  Mild periventricular T2 hyperintensities are present as well. Moderate generalized atrophy is noted. The ventricles are proportionate to the degree of atrophy. No significant extraaxial fluid collection is present. Brainstem is within normal limits. Vascular: Flow voids are present in the anterior circulation. Standard T2 sequences were not performed. Skull and upper cervical spine: Skull base is unremarkable. Sinuses/Orbits: The paranasal sinuses and mastoid air cells are clear. The globes and orbits are within normal limits. MRA HEAD FINDINGS Signal loss in the anterior genu of the cavernous internal  carotid arteries bilaterally appears to be artifactual. No other focal stenosis is present in the internal carotid arteries from the high cervical segments through the ICA termini bilaterally. There is slight signal loss in the distal right A1 segment, likely artifactual. A1 and M1 vessels are otherwise within normal limits. MCA bifurcations are intact. Asymmetric attenuation of right MCA branches is noted. Fetal type posterior cerebral arteries are present bilaterally. The right posterior cerebral artery scratched at the right vertebral artery is the dominant vessel. There is no focal stenosis. Flow is present in the basilar artery. The superior cerebellar arteries are not visualized. There is irregular narrowing of the non dominant left vertebral artery with distal tapering suggesting high-grade stenosis or occlusion. IMPRESSION: 1. Acute/subacute nonhemorrhagic infarcts involving the cerebellum bilaterally, left greater than right. 2. Additional acute/subacute right periventricular white matter nonhemorrhagic infarct in the medial right temporal lobe. 3. Moderate generalized atrophy and white matter disease likely reflects the sequela of chronic microvascular ischemia. 4. High-grade stenosis or occlusion of the non dominant left vertebral artery. 5. Fetal type posterior cerebral arteries bilaterally. 6. Flow is present in the basilar artery. Basilar artery terminates at the superior cerebellar arteries with fetal type posterior cerebral arteries bilaterally. 7. No significant stenosis within the dominant right vertebral artery. Electronically Signed   By: San Morelle M.D.   On: 01/02/2019 06:22   Dg Chest Port 1 View  Result Date: 01/24/2019 CLINICAL DATA:  Respiratory failure, code sepsis. EXAM: PORTABLE CHEST 1 VIEW COMPARISON:  Chest x-rays dated 01/23/2019 FINDINGS: RIGHT IJ central line appears well positioned with tip at the level of the lower SVC/cavoatrial junction. Heart size and  mediastinal contours are stable. Probable mild bibasilar atelectasis. Mild central pulmonary vascular congestion. Stable elevation of the RIGHT hemidiaphragm. IMPRESSION: 1. Mild central pulmonary vascular congestion suggesting mild CHF/volume overload. No evidence of pneumonia or overt alveolar pulmonary edema. 2. Probable mild bibasilar atelectasis. Electronically Signed   By: Franki Cabot M.D.   On: 01/24/2019 06:19   Dg Chest Port 1 View  Result Date: 01/23/2019 CLINICAL DATA:  Cough for 2 days EXAM: PORTABLE CHEST 1 VIEW COMPARISON:  Chest radiograph 01/23/2019 FINDINGS: Right IJ central venous catheter tip projects over the superior vena cava. Monitoring leads overlie the patient. Stable cardiac and mediastinal contours. Aortic atherosclerosis. Elevation right hemidiaphragm. Bilateral interstitial pulmonary opacities. No pleural effusion or pneumothorax. IMPRESSION: Mild interstitial opacities bilaterally may represent edema. Elevation right hemidiaphragm. Electronically Signed   By: Lovey Newcomer M.D.   On: 01/23/2019 18:00   Ct Head Code Stroke Wo Contrast  Result Date: 01/15/2019 CLINICAL DATA:  Code stroke. Focal neuro deficit of less than 6 hours. Stroke suspected. EXAM: CT HEAD WITHOUT CONTRAST TECHNIQUE: Contiguous axial images were obtained from the base of the skull through the vertex without intravenous contrast. COMPARISON:  CT head without contrast 12/14/2018 FINDINGS: Brain: Bilateral nonhemorrhagic superior cerebellar infarcts are new since the prior exam, more prominent left than right. No acute supratentorial  lesion is present. Mild atrophy and white matter changes are stable, within normal limits for age. The ventricles are of normal size. No significant extraaxial fluid collection is present. Vascular: Atherosclerotic calcifications are present within the cavernous internal carotid arteries and at the dural margin of both vertebral arteries. There is no hyperdense vessel. Skull:  Atherosclerotic calcifications are present within the cavernous internal carotid arteries and at the dural margin the vertebral arteries bilaterally. There is mild density in the mid basilar artery and at the basilar tip raising concern for thrombus. Sinuses/Orbits: Calvarium is intact. No focal lytic or blastic lesions are present. Other: The paranasal sinuses and mastoid air cells are clear. Bilateral lens replacements are noted. Globes and orbits are otherwise unremarkable. ASPECTS [facility] Stroke Program Early CT Score) - Ganglionic level infarction (caudate, lentiform nuclei, internal capsule, insula, M1-M3 cortex): 7/7 - Supraganglionic infarction (M4-M6 cortex): 3/3 Total score (0-10 with 10 being normal): 10/10 IMPRESSION: 1. 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 Increased density of the basilar artery raises concern for thrombus. Recommend CTA of the head and neck for further evaluation 3. ASPECTS is 10/10 The above was relayed via text pager to Dr. Samara Snide on 12/31/2018 at 04:58 . Electronically Signed   By: San Morelle M.D.   On: 12/27/2018 04:59     ASSESSMENT AND PLAN   83 y.o. male with past medical history significant for hypertension, hyperlipidemia, myasthenia gravis versus Guillain-Barr admitted to [facility] for sepsis, acute renal failure as well as acute respiratory failure in the setting of pulmonary edema and possible pneumonia.  Also developed acute on chronic systolic heart failure with depressed ejection fraction of 20%.  Patient was extubated on 11/26.  Code stroke activated for sudden onset unresponsiveness and disconjugate gaze, patient improved however still severely dysarthric/anasrthric.  Exam highly concerning for basilar artery occlusion/posterior circulation stroke.  CT head confirmed cerebellar infarcts bilaterally as well as possible basilar artery thrombus.  MRA however was not as  conclusive for started thrombus therefore patient going for diagnostic angiogram to obtain better visualization of top of the basilar artery and potential thrombectomy if clot present.  tPA was not administered due to acute cerebellar stroke and concern for bleeding into cerebellar infarct.   Acute episode of unresponsiveness: Likely secondary to basilar artery thrombus versus hypoperfusion, less likely seizure. Bilateral acute cerebellar infarcts and possible basilar artery thrombus Occlusion/severe stenosis of the left vertebral artery  Plan Patient taken to IR for diagnostic angiogram and possible mechanical thrombectomy   Further recommendations will be made brain neurology after thrombectomy. Will likely need to be continued on aspirin Plavix as well as statin.   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  He is at risk for significant risk of neurological worsening from cerebral edema,  death from brain herniation, heart failure, hemorrhagic conversion, infection, respiratory failure and seizure. This patient's care requires constant monitoring of vital signs, hemodynamics, respiratory and cardiac monitoring, review of multiple databases, neurological assessment, discussion with family, other specialists and medical decision making of high complexity.  I spent 75 minutes of neurocritical time in the care of this patient.     Anahi Belmar [facility] Pager Number [phone]

## 2019-01-25 NOTE — Progress Notes (Signed)
NAME:  Duane Burns, MRN:  [id number], DOB:  09-24-1935, LOS: 2 ADMISSION DATE:  01/08/2019,  CHIEF COMPLAINT:  Chest pain  Brief History   83 yo male who initally presented to [facility] with findings consistent with sepsis. Recent e. Coli bacteremia treated at OSH. While at [facility], patient developed chest pain with multiple PACs and PVCs and was subsequently transferred to [facility] for further evaluation and treatment. On arrival, trops>27k. Lactate 3.7. white count 37k. No ST elevation on initial EKG however did develop new LBBB on later EKG. Cardiology consulted and spoke with intervential cardiology. Given septic state, renal failure, was not candidate for cath lab.  Imaging suggestive of biliary source of sepsis with cholecytitis present. Not a candidate for lap chole at that time so IR consulted with plans for perc cholecystotomy tube place 11/23 however on repeat imaging, gallbladder distension was resolved so tube placement was deferred.  Past Medical History  Myasthenia Gravis HTN Prostate cancer HLD RAS   Significant Hospital Events   11/20 admitted to [facility] with sepsis 11/21 transferred to [facility] for chest pain and EKG changes  Consults:  Cardiology gen surg IR  Procedures:  11/20 CVC 11/22 Intubation 11/23 central IJ line placement  Significant Diagnostic Tests:  11/20 echo: EF 30-35%. Severely decreased LV function. Mild LVH. 11/21 echo: EF 20-25% with severely decreased LV function. No LVH. Diffuse hypokinesis most prominently of the anteroseptal wall and entire apex  11/20 CT c/a/p:pericholecystic edema. Left renal stone  11/21 abd [place]: thickened gallbladder wall with gallstones/sludge. Suspicious for cholecystitis. 11/23 CT a/p: interval resolution of gallbladder distension. New bilateral pleural effusions and bibasilar consolidation  11/21 CXR: worsened CHF pattern 11/22 CXR: progressively worsened pulm edema. Slight increase in  cardiomegaly 11/23 CXR s/p central line placement: improving bilateral pulm infiltrates.  11/24 CXR: pulm vasc congestion improving. Mild bibasilar airspace disease atelectasis vs infection 11/26 chest x-ray: Minimal bilateral vascular congestion. 01/09/2019: Overnight acutely unresponsive, code stroke, new cerebellar strokes, basilar artery angiography by interventional neuro.  Micro Data:  11/20 BC>>NGTD  Antimicrobials:  merrem 11/21>> 01/21/2019 vanc 11/21>> 01/21/2019 01/21/2019 Unasyn>>11/28 Cefepime  Interim history/subjective:   Intubated on mechanical life support.  Withdraws to pain off sedation no spontaneous movements  Objective   Blood pressure 113/73, pulse (!) 25, temperature (!) 97.5 F (36.4 C), temperature source Axillary, resp. rate (!) 29, height 5\' 8"  (1.727 m), weight 78 kg, SpO2 99 %. CVP:  [5 mmHg] 5 mmHg  Vent Mode: PRVC FiO2 (%):  [60 %] 60 % Set Rate:  [16 bmp] 16 bmp Vt Set:  [540 mL] 540 mL PEEP:  [5 cmH20] 5 cmH20 Plateau Pressure:  [17 cmH20] 17 cmH20   Intake/Output Summary (Last 24 hours) at 01/22/2019 [id number] Last data filed at 01/22/2019 [id number] Gross per 24 hour  Intake 1392.96 ml  Output 1945 ml  Net -552.04 ml   Filed Weights   01/23/19 0500 01/24/19 0331 01/06/2019 0205  Weight: 78.5 kg 76.9 kg 78 kg    Examination: General: Intubated on mechanical life support HEENT: NCAT, sclera clear Neuro: Off sedation, unresponsive to voice, grimaces to pain CV: S1-S2, no MRG PULM: Bilateral ventilated breath sounds GI: Soft, nontender nondistended GU: Foley Extremities: No edema Skin: Few petechia   Resolved Hospital Problem list   none  Assessment & Plan:   Acute respiratory failure acquiring intubation and mechanical ventilation Acute pulmonary edema, improved likely multifactorial in setting of pulm edema with possible pneumonia. Plan:  Intubated on mechanical life support Wean FiO2 to maintain SPO2 greater than 90% Once stable  after angiography catheterization can return head of bed to greater than 30 degrees Ventilator associated pneumonia prophylaxis  Septic shock, leukocytosis, unclear etiology, history of gram-negative bacteremia at previous hospitalization, shock state resolved Leukocytosis secondary to steroids Currently on Cefepime, 2 more doses stop Plan Holding additional antimicrobials continue to observe  Hypotensive, likely medication effect postop -Propofol stopped - Low-dose norepinephrine wean off today. - switch sedation to precedx   Acute neurologic decline Acute cerebellar stroke Plan: Status post basilar artery angiography, no stenting EEG pending Possible seizure Continue antiplatelets  Ischemic cardiac event, newly depressed ejection fraction 20% Acute on chronic heart failure Acute on chronic systolic heart failure, volume overload, resolving Trops >27k. New LBBB on EKG. BNP 4100 on admission.  11/21 echo with EF of 20-25% with severe hypokinesis of anteroseptum and apex. Severe Aortic Stenosis.   Will need cath once stable and renal function improves.  CVP 2 Plan Left heart cath authorization pending, on hold due to worsening kidney function  Thickened gallbladder on ultrasound examination but without significant fluid for drainage placement. P: Advance diet as tolerated  AKI. Lab Results  Component Value Date   CREATININE 2.29 (H) 01/16/2019   CREATININE 2.38 (H) 01/24/2019   CREATININE 2.07 (H) 01/23/2019  Slight bump in Creatinine 11/18 CVP is 2, suspect dry Co Ox 56% Plan: Avoiding nephrotoxic drugs Continue to follow   Myasthenia Gravis.   Plan:  Place OG tube Continue 2 mg prednisone daily Continue Mestinon  Hypokalemia Recent Labs  Lab 01/24/19 1000 01/07/2019 0231 01/08/2019 0429  K 3.6 3.4* 3.2*  Hypernatremia Plan: Replete as needed, free water deficit correcting  Urticaria like  Rash developed 11/27 Plan Possibly related to penicillins  Continue oral steroids As needed Benadryl for itching  GOC: Multiple medical comorbidities Agree with ongoing goals of care discussion I attempted to call the patient's daughter however there is no telephone number documented in the chart.  Case was discussed with palliative care at bedside today.  Best practice:  Diet: 01/22/2019 advance diet as tolerated Pain/Anxiety/Delirium protocol (if indicated): n/a DVT prophylaxis: heparin infusion in the setting of MI Mobility: BR Code Status: Full Family Communication: palliative care  Disposition:ICU  This patient is critically ill with multiple organ system failure; which, requires frequent high complexity decision making, assessment, support, evaluation, and titration of therapies. This was completed through the application of advanced monitoring technologies and extensive interpretation of multiple databases. During this encounter critical care time was devoted to patient care services described in this note for 38 minutes.   Garner Nash, DO [facility] 01/14/2019 9:28 AM

## 2019-01-25 NOTE — Anesthesia Procedure Notes (Signed)
Arterial Line Insertion Start/End11/10/2018 6:40 AM, 01/25/2019 6:45 AM Performed by: Roberts Gaudy, MD, anesthesiologist  Patient location: OR. Left, radial was placed Catheter size: 20 G Hand hygiene performed   Attempts: 1 Procedure performed without using ultrasound guided technique. Following insertion, line sutured, dressing applied and Biopatch. Patient tolerated the procedure well with no immediate complications.

## 2019-01-25 NOTE — Progress Notes (Signed)
Patient ID: Duane Burns, male   DOB: 19-Oct-1935, 83 y.o.   MRN: [id number]  This NP visited patient at the bedside as a follow up to  yesterday's [place] for palliative medicine needs and emotional support.  Unfortunately the patient had a sudden episode of unresponsiveness yesterday and Jennefer Bravo of the brain was significant for acute/subacute nonhemorrhagic infarcts involving the cerebellum bilaterally. . Also significant for moderate generalized atrophy and white matter disease.   I spoke to the patient's daughter Arjen Deringer by phone to update her on her father's current medical situation and scheduled a goals of care meeting with both herself and her mother for today at noon.  GOCs family meeting: -Dr. Valeta Harms initially met with patient's wife and daughter and updated them on the patient's medical condition  This nurse practitioner continued conversation regarding diagnoses, prognosis, viable  treatment options, values important to the patient.  We discussed specifically the risks and benefits of both dialysis and artificial feeding.  We discussed concept of failure to thrive and the limitations of medical interventions to prolong quality of life when the body begins to fail to thrive.  Patient's wife was able to verbalize that her husband in the past has expressed that he would never "want to be kept alive on machines"  A Hard Choices Booklet was left for review, spiritual care consult and emotional support offered.  Questions and concerns addressed  Discussed with family the importance of continued conversation with each other  and the medical providers regarding overall plan of care and treatment options,  ensuring decisions are within the context of the patients values and GOCs.     Discussed with Dr Valeta Harms    This NP will follow up in the morning  Total time spent on the unit was 60 minutes  Greater than 50% of the time was spent in counseling and coordination of care  Wadie Lessen  NP  Palliative Medicine Team Team Phone # [phone] Pager [phone]

## 2019-01-25 NOTE — Procedures (Signed)
Patient Name: Duane Burns  MRN: [id number]  Epilepsy Attending: Lora Havens  Referring Physician/Provider: Dr. Rosalin Hawking Date: 01/04/2019 Duration: 24.32 minutes  Patient history: 83 year old male with acute/subacute infarct who presented with sudden episode of unresponsiveness.  EEG to evaluate for seizures.  Level of alertness: Awake, asleep/sedated  AEDs during EEG study: Propofol  Technical aspects: This EEG study was done with scalp electrodes positioned according to the 10-20 International system of electrode placement. Electrical activity was acquired at a sampling rate of 500Hz  and reviewed with a high frequency filter of 70Hz  and a low frequency filter of 1Hz . EEG data were recorded continuously and digitally stored.   Description: EEG showed continuous generalized low voltage 2 to 5 Hz theta- delta slowing.  EEG was reactive to tactile stimuli.  Photic stimulation and hyperventilation were not seen.  Abnormality - Continuous slow, generalized  IMPRESSION: This study is suggestive of severe diffuse encephalopathy, nonspecific to etiology. No seizures or epileptiform discharges were seen throughout the recording.   Jacquez Sheetz Barbra Sarks

## 2019-01-25 NOTE — Progress Notes (Signed)
1845: Pt bradycardic due to precidex. See flowsheet and MAR. Dr. Valeta Harms notified at this time. Will start fentanyl drip per verbal orders from Dr. Valeta Harms.

## 2019-01-25 NOTE — Progress Notes (Addendum)
Pt was stable for the majority of the night, however a definitve mental status change occured at 4:30 am.  Pt was confused, forgetful, and slightly restless overnight; however prior to this mental status change, Pt was responsive, speaking, following commands, and displayed purposeful movement in all extremities- (refer to shift assessments)   At 4:20 am Pt asked this RN to assist him reposition in bed. 2 RN's assisted Pt with repositioning. At 4:30 am Pt suddenly became obtunded, unresponsive, displaying seizure like activity, dysconjugate upward gaze, and questionable stroke like symptoms. CCM &  Dr.Aroor notified immediately. Code stroke was activated. Dr. Lorraine Lax assessed Pt bedside, and Pt was immediatly  taken to CT.  Pt's wife updated about P'ts status/ changes overnight by MD Aroor. MD unable to reach/ update Pt's daughter at this time.   @ 0700- RN able to contact Pt's daughter- Caryl Asp. Pt's daughter updated by RN. Daughter shared her concerns with this RN regarding her mother's (Pt's wife) cognition/ ability to make medical decisions effectively for the Pt. Daughter states her mother has experienced some cognitive decline over the past year and daughter  would like to assist her mother with the Pts plan of care/ medical decisions. This RN notified the night charge RN as well as Pt's day shift RN regarding this information.

## 2019-01-25 NOTE — Clinical Social Work Note (Signed)
CSW will continue to follow for medical stability and disposition.  [place], [place]

## 2019-01-25 NOTE — Progress Notes (Signed)
Right groin checked, dressing c/d/i, level 0.

## 2019-01-25 NOTE — Progress Notes (Signed)
STROKE TEAM PROGRESS NOTE   INTERVAL HISTORY Pt RN at bedside. RN reported that pt went to IR and was intubated since. Not able to extubate due to mental status. Was on precedex and now off. More activities with BUE movement and trying to take breathing tube out. BUE strength seems symmetric. BLE mild withdraw to pain. Again eye opening and became agitation if force to open. BP on the lower end and tapering off neo now. EEG neg for Sz.  Vitals:   01/23/2019 0600 01/24/2019 0800 01/15/2019 0830 01/22/2019 1126  BP: 113/73   (!) 130/58  Pulse: (!) 25     Resp: (!) 29     Temp:      TempSrc:      SpO2: 93%  99%   Weight:      Height:  5\' 8"  (1.727 m)      CBC:  Recent Labs  Lab 01/23/19 0412 01/24/19 0330  01/13/2019 1013 12/28/2018 1025  WBC 29.5* 30.8*  --   --  35.1*  NEUTROABS 26.8*  --   --   --   --   HGB 11.5* 10.3*   < > 9.2* 10.0*  HCT 35.4* 31.6*   < > 27.0* 30.9*  MCV 90.3 90.0  --   --  90.6  PLT 150 167  --   --  233   < > = values in this interval not displayed.    Basic Metabolic Panel:  Recent Labs  Lab 01/21/19 0400  01/23/19 0412 01/24/19 0330  01/02/2019 0231 01/24/2019 0429 01/15/2019 1013  NA 146*   < > 153* 151*  --  146* 145 144  K 3.0*   < > 3.3* 3.3*   < > 3.4* 3.2* 3.4*  CL 107   < > 114* 117*  --  113*  --   --   CO2 26   < > 24 24  --  22  --   --   GLUCOSE 161*   < > 89 118*  --  94  --   --   BUN 77*   < > 72* 69*  --  61*  --   --   CREATININE 2.11*   < > 2.07* 2.38*  --  2.29*  --   --   CALCIUM 8.0*   < > 8.2* 8.1*  --  7.9*  --   --   MG 2.1   < > 2.1 2.3  --   --   --   --   PHOS 4.1  --  3.8  --   --   --   --   --    < > = values in this interval not displayed.   Lipid Panel:     Component Value Date/Time   CHOL 171 01/27/2018 0911   TRIG 110 01/12/2019 1025   HDL 61 01/27/2018 0911   CHOLHDL 2.8 01/27/2018 0911   LDLCALC 96 01/27/2018 0911   HgbA1c:  Lab Results  Component Value Date   HGBA1C 6.2 (H) 01/24/2019   Urine Drug Screen:      Component Value Date/Time   LABOPIA NONE DETECTED 11/10/2014 1740   COCAINSCRNUR NONE DETECTED 11/10/2014 1740   LABBENZ NONE DETECTED 11/10/2014 1740   AMPHETMU NONE DETECTED 11/10/2014 1740   THCU NONE DETECTED 11/10/2014 1740   LABBARB NONE DETECTED 11/10/2014 1740    Alcohol Level     Component Value Date/Time   ETH <5 11/10/2014 1621  IMAGING Dg Abd 1 View  Result Date: 01/11/2019 CLINICAL DATA:  OG tube placement. EXAM: ABDOMEN - 1 VIEW COMPARISON:  Chest x-ray 01/24/2019. FINDINGS: A G-tube noted with tip coiled in the stomach. No gastric or bowel distention. No free air. Right IJ line noted with tip over cavoatrial junction. IMPRESSION: OG tube noted with tip coiled in the stomach. Electronically Signed   By: Marcello Moores  Register   On: 01/19/2019 11:34   Mr Angio Head Wo Contrast  Result Date: 01/19/2019 CLINICAL DATA:  Code stroke. Unresponsive. EXAM: MRI HEAD WITHOUT CONTRAST MRA HEAD WITHOUT CONTRAST TECHNIQUE: Multiplanar, multiecho pulse sequences of the brain and surrounding structures were obtained without intravenous contrast. Angiographic images of the head were obtained using MRA technique without contrast. COMPARISON:  CT head without contrast 01/17/2019 FINDINGS: MRI HEAD FINDINGS Brain: The diffusion-weighted images confirm acute nonhemorrhagic infarcts of the cerebellum bilaterally, left greater than right. An additional right periventricular white matter nonhemorrhagic acute infarct is noted in the medial right temporal lobe. T2 FLAIR signal is associated with the areas of acute infarction. Mild periventricular T2 hyperintensities are present as well. Moderate generalized atrophy is noted. The ventricles are proportionate to the degree of atrophy. No significant extraaxial fluid collection is present. Brainstem is within normal limits. Vascular: Flow voids are present in the anterior circulation. Standard T2 sequences were not performed. Skull and upper cervical  spine: Skull base is unremarkable. Sinuses/Orbits: The paranasal sinuses and mastoid air cells are clear. The globes and orbits are within normal limits. MRA HEAD FINDINGS Signal loss in the anterior genu of the cavernous internal carotid arteries bilaterally appears to be artifactual. No other focal stenosis is present in the internal carotid arteries from the high cervical segments through the ICA termini bilaterally. There is slight signal loss in the distal right A1 segment, likely artifactual. A1 and M1 vessels are otherwise within normal limits. MCA bifurcations are intact. Asymmetric attenuation of right MCA branches is noted. Fetal type posterior cerebral arteries are present bilaterally. The right posterior cerebral artery scratched at the right vertebral artery is the dominant vessel. There is no focal stenosis. Flow is present in the basilar artery. The superior cerebellar arteries are not visualized. There is irregular narrowing of the non dominant left vertebral artery with distal tapering suggesting high-grade stenosis or occlusion. IMPRESSION: 1. Acute/subacute nonhemorrhagic infarcts involving the cerebellum bilaterally, left greater than right. 2. Additional acute/subacute right periventricular white matter nonhemorrhagic infarct in the medial right temporal lobe. 3. Moderate generalized atrophy and white matter disease likely reflects the sequela of chronic microvascular ischemia. 4. High-grade stenosis or occlusion of the non dominant left vertebral artery. 5. Fetal type posterior cerebral arteries bilaterally. 6. Flow is present in the basilar artery. Basilar artery terminates at the superior cerebellar arteries with fetal type posterior cerebral arteries bilaterally. 7. No significant stenosis within the dominant right vertebral artery. Electronically Signed   By: San Morelle M.D.   On: 01/13/2019 06:22   Mr Brain Wo Contrast  Result Date: 01/17/2019 CLINICAL DATA:  Code stroke.  Unresponsive. EXAM: MRI HEAD WITHOUT CONTRAST MRA HEAD WITHOUT CONTRAST TECHNIQUE: Multiplanar, multiecho pulse sequences of the brain and surrounding structures were obtained without intravenous contrast. Angiographic images of the head were obtained using MRA technique without contrast. COMPARISON:  CT head without contrast 01/20/2019 FINDINGS: MRI HEAD FINDINGS Brain: The diffusion-weighted images confirm acute nonhemorrhagic infarcts of the cerebellum bilaterally, left greater than right. An additional right periventricular white matter nonhemorrhagic acute infarct is noted in  the medial right temporal lobe. T2 FLAIR signal is associated with the areas of acute infarction. Mild periventricular T2 hyperintensities are present as well. Moderate generalized atrophy is noted. The ventricles are proportionate to the degree of atrophy. No significant extraaxial fluid collection is present. Brainstem is within normal limits. Vascular: Flow voids are present in the anterior circulation. Standard T2 sequences were not performed. Skull and upper cervical spine: Skull base is unremarkable. Sinuses/Orbits: The paranasal sinuses and mastoid air cells are clear. The globes and orbits are within normal limits. MRA HEAD FINDINGS Signal loss in the anterior genu of the cavernous internal carotid arteries bilaterally appears to be artifactual. No other focal stenosis is present in the internal carotid arteries from the high cervical segments through the ICA termini bilaterally. There is slight signal loss in the distal right A1 segment, likely artifactual. A1 and M1 vessels are otherwise within normal limits. MCA bifurcations are intact. Asymmetric attenuation of right MCA branches is noted. Fetal type posterior cerebral arteries are present bilaterally. The right posterior cerebral artery scratched at the right vertebral artery is the dominant vessel. There is no focal stenosis. Flow is present in the basilar artery. The  superior cerebellar arteries are not visualized. There is irregular narrowing of the non dominant left vertebral artery with distal tapering suggesting high-grade stenosis or occlusion. IMPRESSION: 1. Acute/subacute nonhemorrhagic infarcts involving the cerebellum bilaterally, left greater than right. 2. Additional acute/subacute right periventricular white matter nonhemorrhagic infarct in the medial right temporal lobe. 3. Moderate generalized atrophy and white matter disease likely reflects the sequela of chronic microvascular ischemia. 4. High-grade stenosis or occlusion of the non dominant left vertebral artery. 5. Fetal type posterior cerebral arteries bilaterally. 6. Flow is present in the basilar artery. Basilar artery terminates at the superior cerebellar arteries with fetal type posterior cerebral arteries bilaterally. 7. No significant stenosis within the dominant right vertebral artery. Electronically Signed   By: San Morelle M.D.   On: 01/16/2019 06:22   Dg Chest Port 1 View  Result Date: 01/24/2019 CLINICAL DATA:  Respiratory failure, code sepsis. EXAM: PORTABLE CHEST 1 VIEW COMPARISON:  Chest x-rays dated 01/23/2019 FINDINGS: RIGHT IJ central line appears well positioned with tip at the level of the lower SVC/cavoatrial junction. Heart size and mediastinal contours are stable. Probable mild bibasilar atelectasis. Mild central pulmonary vascular congestion. Stable elevation of the RIGHT hemidiaphragm. IMPRESSION: 1. Mild central pulmonary vascular congestion suggesting mild CHF/volume overload. No evidence of pneumonia or overt alveolar pulmonary edema. 2. Probable mild bibasilar atelectasis. Electronically Signed   By: Franki Cabot M.D.   On: 01/24/2019 06:19   Dg Chest Port 1 View  Result Date: 01/23/2019 CLINICAL DATA:  Cough for 2 days EXAM: PORTABLE CHEST 1 VIEW COMPARISON:  Chest radiograph 01/23/2019 FINDINGS: Right IJ central venous catheter tip projects over the superior  vena cava. Monitoring leads overlie the patient. Stable cardiac and mediastinal contours. Aortic atherosclerosis. Elevation right hemidiaphragm. Bilateral interstitial pulmonary opacities. No pleural effusion or pneumothorax. IMPRESSION: Mild interstitial opacities bilaterally may represent edema. Elevation right hemidiaphragm. Electronically Signed   By: Lovey Newcomer M.D.   On: 01/23/2019 18:00   Ct Head Code Stroke Wo Contrast  Result Date: 01/15/2019 CLINICAL DATA:  Code stroke. Focal neuro deficit of less than 6 hours. Stroke suspected. EXAM: CT HEAD WITHOUT CONTRAST TECHNIQUE: Contiguous axial images were obtained from the base of the skull through the vertex without intravenous contrast. COMPARISON:  CT head without contrast 12/14/2018 FINDINGS: Brain: Bilateral nonhemorrhagic superior  cerebellar infarcts are new since the prior exam, more prominent left than right. No acute supratentorial lesion is present. Mild atrophy and white matter changes are stable, within normal limits for age. The ventricles are of normal size. No significant extraaxial fluid collection is present. Vascular: Atherosclerotic calcifications are present within the cavernous internal carotid arteries and at the dural margin of both vertebral arteries. There is no hyperdense vessel. Skull: Atherosclerotic calcifications are present within the cavernous internal carotid arteries and at the dural margin the vertebral arteries bilaterally. There is mild density in the mid basilar artery and at the basilar tip raising concern for thrombus. Sinuses/Orbits: Calvarium is intact. No focal lytic or blastic lesions are present. Other: The paranasal sinuses and mastoid air cells are clear. Bilateral lens replacements are noted. Globes and orbits are otherwise unremarkable. ASPECTS [facility] Stroke Program Early CT Score) - Ganglionic level infarction (caudate, lentiform nuclei, internal capsule, insula, M1-M3 cortex): 7/7 - Supraganglionic  infarction (M4-M6 cortex): 3/3 Total score (0-10 with 10 being normal): 10/10 IMPRESSION: 1. 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 Increased density of the basilar artery raises concern for thrombus. Recommend CTA of the head and neck for further evaluation 3. ASPECTS is 10/10 The above was relayed via text pager to Dr. Samara Snide on 01/08/2019 at 04:58 . Electronically Signed   By: San Morelle M.D.   On: 01/07/2019 04:59   Cerebral Angio 01/08/2019 1.Occluded Lt VA at origin with no distal reconstitution. 2.Widely patent basilar artery.  EEG Abnormality - Continuous slow, generalized IMPRESSION: This study is suggestive of severe diffuse encephalopathy, nonspecific to etiology. No seizures or epileptiform discharges were seen throughout the recording.   PHYSICAL EXAM  Temp:  [97 F (36.1 C)-98.7 F (37.1 C)] 97.8 F (36.6 C) (11/30 1600) Pulse Rate:  [25-116] 51 (11/30 1600) Resp:  [14-35] 19 (11/30 1600) BP: (76-147)/(45-81) 117/56 (11/30 1600) SpO2:  [93 %-100 %] 100 % (11/30 1600) Arterial Line BP: (92-157)/(41-68) 126/50 (11/30 1600) FiO2 (%):  [30 %-60 %] 30 % (11/30 1520) Weight:  [78 kg] 78 kg (11/30 0205)  General - Well nourished, well developed, intubated off sedation.  Ophthalmologic - fundi not visualized due to noncooperation.  Cardiovascular - Regular rate and rhythm.  Neuro - intubated off sedation, eyes closed, not following commands. Pt became agitated and against forced eye opening, not able to exam pupils or corneal reflexes, doll's eyes not able to test, gag and cough present. Breathing over the vent.  Facial symmetry not able to test due to ET tube.  Tongue protrusion not cooperative. On pain stimulation, he became agitated, BUE against gravity and strong, fighting. BLE on brace, mild withdraw on pain stimulation. Sensation, coordination and gait not tested.   ASSESSMENT/PLAN Duane Burns is a 83 y.o. male with history of hypertension, hyperlipidemia, severe aortic stenosis, myasthenia gravis versus Guillain-Barr presenting to [facility] 11/20 and  transferred 11/20 to [facility] for sepsis, acute renal failure and acute respiratory failure in the setting of pulmonary edema and possible pneumonia. Found to have acute on chronic systolic heart failure w/ EF 20%. Extubated 11/26 until 11/30 0430 when he became suddenly unresponsive.  CT head showed L & R cerebellar infarcts, concern for hyperdense basilar artery.  MRI confirmed cerebell ar stroke but MRA did not confirm basilar thrombus. Intubated. Sent to IR for diagnostic cerebral angiogram.   Acute encephalopathy  Acute mental status change early this am 4am  Unresponsiveness,  reported disconjugate eyes, clenching jaws and flaccid extremities  Received ativan with some improvement  Etiology not clear, could be seizure with post ictal. EEG no Sz but encephalopathy  Several factors for lowered seizure threshold, including AKI, severe leukocytosis and encephalopathy  Posterior stroke is in DDx but MRA and angiogram showed patent BA, the stroke on CT and MRI was subacute instead of acute, felt not able to explain pt presentation  Continue neuro check and seizure precaution  Continue to correct metabolic derangement   Stroke:   L> R cerebellar and R temporal lobe infarcts embolic secondary to unknown source - low EF and possible AF 11/23  Code Stroke CT head L>R superior cerebellar infarcts. BA density ? Thrombus. ASPECTS 10.     MRI  L>R cerebellar infarcts. R periventricular white matter infarct medial R temporal lobe. Small vessel disease. Atrophy.   MRA L VA occlusion. B fetal PCAs. Flow present in BA.   Cerebral Angio L VA occlusion. Patent BA.   2D Echo EF 20-25%. Moderate to severe AS   EEG no sz. Severe diffuse encephalopathy  LE venous doppler pending  LDL 96  HgbA1c  6.2  Heparin 5000 units sq tid for VTE prophylaxis  aspirin 81 mg daily prior to admission, now on aspirin 81 mg daily and clopidogrel 75 mg daily.  May consider anticoagulation if not contraindicated given severe cardiomyopathy and stroke  Therapy recommendations:  pending   Disposition:  pending   Palliative care following with ongoing discussion.   Possible Atrial Fibrillation  ? AF overnight on 11/23  Now in NSR w/ PVCs. No recurrent AF  Treated with IV heparin, now off  On DAPT now  Cardiomyopathy  11/20 EF 30 to 35%  11/21 EF 20 to 25%  Treated for pulmonary edema  On DAPT  May consider anticoagulation if not contraindicated   Hypotension Hx Hypertension  Stable . Avoid low BP . Long-term BP goal normotensive  AKI  Creatinine 2.07-2.3-2.29  On IV fluid  Management per primary team  acute respiratory failure   in the setting of pulmonary edema and possible pneumonia.   Intubated->Extubated->Reintubated  Septic shock  Tapering off neo  Leukocytosis WBC 29.5-30.8  Afebrile  On cefepime and vancomycin  Hyperlipidemia  Home meds:  Fish oil and pravachol 80  Now on lipitor 80  LDL 96, goal < 70  Continue statin at discharge  Diabetes, type II, controlled  HgbA1c 6.2, goal < 7.0   SSI   CBG monitoring  Malnutrition / Dysphagia . Secondary to stroke . NPO  Other Stroke Risk Factors  Advanced age  Hx Cigarette smoker  CAD w/ ischemic cardiac event, not a candidate for acute w/u  severe aortic stenosis  acute on chronic systolic heart failure w/ EF 20%.   Other Active Problems  Hypokalemia  Hypernatremia   Urticaria ? R/t PCN  Cholecystitis w/ GB distension, resolved on own  Hx myasthenia gravis versus Guillain-Barr - on Mestinon and prednisone  Hospital day # 10  This patient is critically ill due to acute encephalopathy, stroke, possible seizure, cardiomyopathy, hypotension, sepsis and at significant risk  of neurological worsening, death form recurrent stroke, status epilepticus, septic shock, cardiogenic shock, heart failure. This patient's care requires constant monitoring of vital signs, hemodynamics, respiratory and cardiac monitoring, review of multiple databases, neurological assessment, discussion with family, other specialists and medical decision making of high complexity. I spent 30 minutes of neurocritical care time in the care of this patient.  Duane Burns  Erlinda Hong, MD PhD Stroke Neurology 01/07/2019 5:06 PM    To contact Stroke Continuity provider, please refer to [email or web address] After hours, contact General Neurology

## 2019-01-25 NOTE — Transfer of Care (Signed)
Immediate Anesthesia Transfer of Care Note  Patient: Duane Burns  Procedure(s) Performed: IR WITH ANESTHESIA (N/A )  Patient Location: ICU  Anesthesia Type:General  Level of Consciousness: Patient remains intubated per anesthesia plan  Airway & Oxygen Therapy: Patient remains intubated per anesthesia plan and Patient placed on Ventilator (see vital sign flow sheet for setting)  Post-op Assessment: Report given to RN and Post -op Vital signs reviewed and stable  Post vital signs: Reviewed and stable  Last Vitals:  Vitals Value Taken Time  BP 105/46   Temp    Pulse 86 01/23/2019 0829  Resp 20 12/27/2018 0830  SpO2 99 % 01/16/2019 0829  Vitals shown include unvalidated device data.  Last Pain:  Vitals:   01/03/2019 0400  TempSrc: Axillary  PainSc:       Patients Stated Pain Goal: 3 ([id number])  Complications: No apparent anesthesia complications

## 2019-01-25 NOTE — Progress Notes (Signed)
EEG complete - results pending 

## 2019-01-26 ENCOUNTER — Inpatient Hospital Stay (HOSPITAL_COMMUNITY): Payer: Medicare HMO

## 2019-01-26 ENCOUNTER — Encounter (HOSPITAL_COMMUNITY): Payer: Self-pay | Admitting: Interventional Radiology

## 2019-01-26 DIAGNOSIS — Z978 Presence of other specified devices: Secondary | ICD-10-CM

## 2019-01-26 DIAGNOSIS — I63411 Cerebral infarction due to embolism of right middle cerebral artery: Secondary | ICD-10-CM

## 2019-01-26 LAB — COOXEMETRY PANEL
Carboxyhemoglobin: 1.3 % (ref 0.5–1.5)
Methemoglobin: 1.4 % (ref 0.0–1.5)
O2 Saturation: 72.1 %
Total hemoglobin: 10 g/dL — ABNORMAL LOW (ref 12.0–16.0)

## 2019-01-26 LAB — BASIC METABOLIC PANEL
Anion gap: 18 — ABNORMAL HIGH (ref 5–15)
BUN: 63 mg/dL — ABNORMAL HIGH (ref 8–23)
CO2: 16 mmol/L — ABNORMAL LOW (ref 22–32)
Calcium: 8.2 mg/dL — ABNORMAL LOW (ref 8.9–10.3)
Chloride: 115 mmol/L — ABNORMAL HIGH (ref 98–111)
Creatinine, Ser: 2.59 mg/dL — ABNORMAL HIGH (ref 0.61–1.24)
GFR calc Af Amer: 26 mL/min — ABNORMAL LOW (ref >60)
GFR calc non Af Amer: 22 mL/min — ABNORMAL LOW (ref >60)
Glucose, Bld: 107 mg/dL — ABNORMAL HIGH (ref 70–99)
Potassium: 4.2 mmol/L (ref 3.5–5.1)
Sodium: 149 mmol/L — ABNORMAL HIGH (ref 135–145)

## 2019-01-26 LAB — POCT I-STAT 7, (LYTES, BLD GAS, ICA,H+H)
Acid-base deficit: 2 mmol/L (ref 0.0–2.0)
Bicarbonate: 23.3 mmol/L (ref 20.0–28.0)
Calcium, Ion: 1.15 mmol/L (ref 1.15–1.40)
HCT: 28 % — ABNORMAL LOW (ref 39.0–52.0)
Hemoglobin: 9.5 g/dL — ABNORMAL LOW (ref 13.0–17.0)
O2 Saturation: 100 %
Potassium: 3.5 mmol/L (ref 3.5–5.1)
Sodium: 145 mmol/L (ref 135–145)
TCO2: 25 mmol/L (ref 22–32)
pCO2 arterial: 40.8 mmHg (ref 32.0–48.0)
pH, Arterial: 7.365 (ref 7.350–7.450)
pO2, Arterial: 223 mmHg — ABNORMAL HIGH (ref 83.0–108.0)

## 2019-01-26 LAB — LIPID PANEL
Cholesterol: 88 mg/dL (ref 0–200)
HDL: 18 mg/dL — ABNORMAL LOW (ref >40)
LDL Cholesterol: 50 mg/dL (ref 0–99)
Total CHOL/HDL Ratio: 4.9 RATIO
Triglycerides: 101 mg/dL (ref <150)
VLDL: 20 mg/dL (ref 0–40)

## 2019-01-26 LAB — GLUCOSE, CAPILLARY
Glucose-Capillary: 92 mg/dL (ref 70–99)
Glucose-Capillary: 94 mg/dL (ref 70–99)
Glucose-Capillary: 98 mg/dL (ref 70–99)
Glucose-Capillary: 111 mg/dL — ABNORMAL HIGH (ref 70–99)
Glucose-Capillary: 127 mg/dL — ABNORMAL HIGH (ref 70–99)
Glucose-Capillary: 134 mg/dL — ABNORMAL HIGH (ref 70–99)

## 2019-01-26 LAB — MAGNESIUM
Magnesium: 2.3 mg/dL (ref 1.7–2.4)
Magnesium: 2.2 mg/dL (ref 1.7–2.4)

## 2019-01-26 LAB — PHOSPHORUS
Phosphorus: 5.4 mg/dL — ABNORMAL HIGH (ref 2.5–4.6)
Phosphorus: 5.1 mg/dL — ABNORMAL HIGH (ref 2.5–4.6)

## 2019-01-26 MED ORDER — VITAL AF 1.2 CAL PO LIQD
1000.0000 mL | ORAL | Status: DC
  Administered 2019-01-26 – 2019-01-30 (×3): 1000 mL

## 2019-01-26 MED ORDER — PRO-STAT SUGAR FREE PO LIQD
30.0000 mL | Freq: Two times a day (BID) | ORAL | Status: DC
  Administered 2019-01-26 – 2019-01-30 (×9): 30 mL
  Filled 2019-01-26 (×9): qty 30

## 2019-01-26 NOTE — Progress Notes (Signed)
SLP Cancellation Note  Patient Details Name: EULES CRUMBLISS MRN: [id number] DOB: 1936/02/14   Cancelled treatment:       Reason Eval/Treat Not Completed: Medical issues which prohibited therapy. Will continue to follow.    Venita Sheffield Delfin Squillace 01/26/2019, 7:12 AM  Pollyann Glen, M.A. [place] [facility] Environmental education officer [phone] Office [phone]

## 2019-01-26 NOTE — Progress Notes (Signed)
Patient ID: Duane Burns, male   DOB: [birth year], 83 y.o.   MRN: [id number]  This NP visited patient at the bedside as a follow up for palliative medicine needs and emotional support.  Patient remains intubated and unable to follow commands 2/2 to acute/subacute nonhemorrhagic infarcts found on MRI yesterday.  Patient also with moderate generalized atrophy and white matter disease.  Wife at bedside, I worry that she does not have clear insight into the seriousness of her husband situation.  Today she stated that her husband in the past has expressed that he would never "want to be kept alive on machines"  I spoke to the patient's daughter Yaw Klick by phone to update her on her father's current medical situation and the importance of clarifying goals of care specific to comfort care approach vs aggressive medical interventions.  Wife needs her children's support to make decisions.     Plan is to have a family meeting tomorrow at noon.  I spoke to nursing staff/Christine and it is been cleared for the patient's wife and her 3 children to attend this goals of care meeting.  Names of the family members are Rinaldo Cloud, Rogelia Rohrer, Tonye Pearson and the patient's wife.   Questions and concerns addressed  Encouraged family to continued conversation with each other tonight regarding overall plan of care and treatment options,  ensuring decisions are within the context of the patients values and GOCs.      Total time spent on the unit was 35 minutes   Discussed with bedside RN note  Greater than 50% of the time was spent in counseling and coordination of care  Wadie Lessen NP  Palliative Medicine Team Team Phone # [phone] Pager [phone]

## 2019-01-26 NOTE — Progress Notes (Signed)
NAME:  Duane Burns, MRN:  [id number], DOB:  [birth year], LOS: 15 ADMISSION DATE:  01/17/2019,  CHIEF COMPLAINT:  Chest pain  Brief History   83 yo male who initally presented to [facility] with findings consistent with sepsis. Recent e. Coli bacteremia treated at OSH. While at [facility], patient developed chest pain with multiple PACs and PVCs and was subsequently transferred to [facility] for further evaluation and treatment. On arrival, trops>27k. Lactate 3.7. white count 37k. No ST elevation on initial EKG however did develop new LBBB on later EKG. Cardiology consulted and spoke with intervential cardiology. Given septic state, renal failure, was not candidate for cath lab.  Imaging suggestive of biliary source of sepsis with cholecytitis present. Not a candidate for lap chole at that time so IR consulted with plans for perc cholecystotomy tube place 11/23 however on repeat imaging, gallbladder distension was resolved so tube placement was deferred.  Past Medical History  Myasthenia Gravis HTN Prostate cancer HLD RAS   Significant Hospital Events   11/20 admitted to [facility] with sepsis 11/21 transferred to [facility] for chest pain and EKG changes  Consults:  Cardiology gen surg IR  Procedures:  11/20 CVC 11/22 Intubation 11/23 central IJ line placement  Significant Diagnostic Tests:  11/20 echo: EF 30-35%. Severely decreased LV function. Mild LVH. 11/21 echo: EF 20-25% with severely decreased LV function. No LVH. Diffuse hypokinesis most prominently of the anteroseptal wall and entire apex  11/20 CT c/a/p:pericholecystic edema. Left renal stone  11/21 abd [place]: thickened gallbladder wall with gallstones/sludge. Suspicious for cholecystitis. 11/23 CT a/p: interval resolution of gallbladder distension. New bilateral pleural effusions and bibasilar consolidation  11/21 CXR: worsened CHF pattern 11/22 CXR: progressively worsened pulm edema. Slight increase in  cardiomegaly 11/23 CXR s/p central line placement: improving bilateral pulm infiltrates.  11/24 CXR: pulm vasc congestion improving. Mild bibasilar airspace disease atelectasis vs infection 11/26 chest x-ray: Minimal bilateral vascular congestion. 01/15/2019: Overnight acutely unresponsive, code stroke, new cerebellar strokes, basilar artery angiography by interventional neuro.  Micro Data:  11/20 BC>>NGTD  Antimicrobials:  merrem 11/21>> 01/21/2019 vanc 11/21>> 01/21/2019 01/21/2019 Unasyn>>11/28 Cefepime  Interim history/subjective:   Intubated on mechanical life support.  Withdraws to pain off sedation no spontaneous movements  Objective   Blood pressure (!) 125/94, pulse (!) 121, temperature 98.2 F (36.8 C), temperature source Oral, resp. rate (!) 27, height 5\' 8"  (1.727 m), weight 78 kg, SpO2 99 %. CVP:  [7 mmHg] 7 mmHg  Vent Mode: PRVC FiO2 (%):  [30 %] 30 % Set Rate:  [16 bmp] 16 bmp Vt Set:  [410 mL] 410 mL PEEP:  [5 cmH20] 5 cmH20 Pressure Support:  [5 cmH20] 5 cmH20 Plateau Pressure:  [14 cmH20-19 cmH20] 19 cmH20   Intake/Output Summary (Last 24 hours) at 01/26/2019 1101 Last data filed at 01/26/2019 1000 Gross per 24 hour  Intake 630.29 ml  Output 1210 ml  Net -579.71 ml   Filed Weights   01/23/19 0500 01/24/19 0331 01/09/2019 0205  Weight: 78.5 kg 76.9 kg 78 kg    Examination: General: Elderly male intubated on mechanical life support HEENT: NCAT, sclera clear, attempts to track Neuro: Off sedation, grimaces to pain, moves all 4 extremities CV: Regular rate and rhythm, S1-S2 PULM: Bilateral ventilated breath sounds GI: Soft, nontender, nondistended GU: Foley in place Extremities: No edema Skin: [facility] Problem list   none  Assessment & Plan:   Acute respiratory failure acquiring intubation and  mechanical ventilation Acute pulmonary edema, improved Plan:  Patient at this time remains intubated on mechanical life support Wean  FiO2 to maintain SPO2 greater than 90% Unable to tolerate SBT this morning too agitated not following commands for SAT Continue ventilator associated pneumonia prophylaxis head of bed elevated at 30 degrees We will assess again tomorrow for potential liberation from ventilator I think we should have clear defined goals from family before liberation.  My recommendation would be a one-way extubation for this gentleman.  Please continue ongoing goals of care discussion and we appreciate input from palliative care. I discussed this with the patient's wife at bedside again this morning.  Septic shock, leukocytosis, unclear etiology, history of gram-negative bacteremia at previous hospitalization, shock state resolved Leukocytosis secondary to steroids Currently on Cefepime, 2 more doses stop Plan Holding antimicrobials, continue to observe at this time  Hypotensive, likely medication effect postop, now resolved -Precedex plus fentanyl  Acute neurologic decline Acute cerebellar stroke Plan: Status post basilar artery angiography, no stenting EEG with diffuse encephalopathy no seizure Improving however patient still agitated  Ischemic cardiac event, newly depressed ejection fraction 20% Acute on chronic heart failure Acute on chronic systolic heart failure, volume overload, resolving Trops >27k. New LBBB on EKG. BNP 4100 on admission.  11/21 echo with EF of 20-25% with severe hypokinesis of anteroseptum and apex. Severe Aortic Stenosis.   Will need cath once stable and renal function improves.  CVP 2 Plan Pending left heart catheterization however unlikely not going to happen due to kidney function Discussed with cardiology today.  Patient has continued decline and multiple medical problems.  Thickened gallbladder on ultrasound examination but without significant fluid for drainage placement. P: Restart tube feeds  AKI. Lab Results  Component Value Date   CREATININE 2.59 (H)  01/26/2019   CREATININE 2.29 (H) 01/14/2019   CREATININE 2.38 (H) 01/24/2019  Slight bump in Creatinine 11/18 CVP is 2, suspect dry Co Ox 56% Plan: Voiding nephrotoxic drugs  Myasthenia Gravis.   Plan:  10 mg prednisone daily plus Mestinon  Hypokalemia Recent Labs  Lab 01/03/2019 1013 01/09/2019 1535 01/26/19 0500  K 3.4* 4.4 4.2  Hypernatremia Plan: Continue to observe  Urticaria like  Rash developed 11/27 Plan Steroids plus Benadryl  GOC: Continue ongoing discussions with palliative care.  Recommendation would be for DNR status as well as one-way extubation once stable.  Continue discussions with family  Best practice:  Diet: 01/22/2019 advance diet as tolerated Pain/Anxiety/Delirium protocol (if indicated): n/a DVT prophylaxis: Heparin Mobility: BR Code Status: Full Family Communication: Discussed with wife at bedside this morning Disposition:ICU   This patient is critically ill with multiple organ system failure; which, requires frequent high complexity decision making, assessment, support, evaluation, and titration of therapies. This was completed through the application of advanced monitoring technologies and extensive interpretation of multiple databases. During this encounter critical care time was devoted to patient care services described in this note for 32 minutes.  [facility] 01/26/2019 11:01 AM

## 2019-01-26 NOTE — Progress Notes (Signed)
STROKE TEAM PROGRESS NOTE   INTERVAL HISTORY RN at bedside.  Patient still intubated, however more awake alert, eyes open, however not following commands, not tracking.  Still agitated with bilateral upper extremity moving constantly.  Palliative care on board, will have family meeting tomorrow.  Vitals:   01/26/19 0700 01/26/19 0800 01/26/19 0900 01/26/19 1000  BP: 118/72  (!) 125/94   Pulse: 62  93 (!) 121  Resp: (!) 23  18 (!) 27  Temp: 98.2 F (36.8 C)     TempSrc: Oral     SpO2: 98% 98% 98% 99%  Weight:      Height:        CBC:  Recent Labs  Lab 01/23/19 0412 01/24/19 0330  01/14/2019 1025 12/29/2018 1535  WBC 29.5* 30.8*  --  35.1*  --   NEUTROABS 26.8*  --   --   --   --   HGB 11.5* 10.3*   < > 10.0* 9.9*  HCT 35.4* 31.6*   < > 30.9* 29.0*  MCV 90.3 90.0  --  90.6  --   PLT 150 167  --  233  --    < > = values in this interval not displayed.    Basic Metabolic Panel:  Recent Labs  Lab 01/23/19 0412 01/24/19 0330  01/05/2019 0231  01/19/2019 1535 01/26/19 0500 01/26/19 0923  NA 153* 151*  --  146*   < > 144 149*  --   K 3.3* 3.3*   < > 3.4*   < > 4.4 4.2  --   CL 114* 117*  --  113*  --   --  115*  --   CO2 24 24  --  22  --   --  16*  --   GLUCOSE 89 118*  --  94  --   --  107*  --   BUN 72* 69*  --  61*  --   --  63*  --   CREATININE 2.07* 2.38*  --  2.29*  --   --  2.59*  --   CALCIUM 8.2* 8.1*  --  7.9*  --   --  8.2*  --   MG 2.1 2.3  --   --   --   --   --  2.3  PHOS 3.8  --   --   --   --   --   --  5.4*   < > = values in this interval not displayed.   Lipid Panel:     Component Value Date/Time   CHOL 88 01/26/2019 0500   CHOL 171 01/27/2018 0911   TRIG 101 01/26/2019 0500   HDL 18 (L) 01/26/2019 0500   HDL 61 01/27/2018 0911   CHOLHDL 4.9 01/26/2019 0500   VLDL 20 01/26/2019 0500   LDLCALC 50 01/26/2019 0500   LDLCALC 96 01/27/2018 0911   HgbA1c:  Lab Results  Component Value Date   HGBA1C 6.2 (H) 01/24/2019   Urine Drug Screen:      Component Value Date/Time   LABOPIA NONE DETECTED 11/10/2014 1740   COCAINSCRNUR NONE DETECTED 11/10/2014 1740   LABBENZ NONE DETECTED 11/10/2014 1740   AMPHETMU NONE DETECTED 11/10/2014 1740   THCU NONE DETECTED 11/10/2014 1740   LABBARB NONE DETECTED 11/10/2014 1740    Alcohol Level     Component Value Date/Time   ETH <5 11/10/2014 1621    IMAGING Dg Abd 1 View  Result Date: 01/13/2019 CLINICAL DATA:  OG tube placement. EXAM: ABDOMEN - 1 VIEW COMPARISON:  Chest x-ray 01/24/2019. FINDINGS: A G-tube noted with tip coiled in the stomach. No gastric or bowel distention. No free air. Right IJ line noted with tip over cavoatrial junction. IMPRESSION: OG tube noted with tip coiled in the stomach. Electronically Signed   By: Marcello Moores  Register   On: 01/01/2019 11:34   Mr Angio Head Wo Contrast  Result Date: 01/10/2019 CLINICAL DATA:  Code stroke. Unresponsive. EXAM: MRI HEAD WITHOUT CONTRAST MRA HEAD WITHOUT CONTRAST TECHNIQUE: Multiplanar, multiecho pulse sequences of the brain and surrounding structures were obtained without intravenous contrast. Angiographic images of the head were obtained using MRA technique without contrast. COMPARISON:  CT head without contrast 01/24/2019 FINDINGS: MRI HEAD FINDINGS Brain: The diffusion-weighted images confirm acute nonhemorrhagic infarcts of the cerebellum bilaterally, left greater than right. An additional right periventricular white matter nonhemorrhagic acute infarct is noted in the medial right temporal lobe. T2 FLAIR signal is associated with the areas of acute infarction. Mild periventricular T2 hyperintensities are present as well. Moderate generalized atrophy is noted. The ventricles are proportionate to the degree of atrophy. No significant extraaxial fluid collection is present. Brainstem is within normal limits. Vascular: Flow voids are present in the anterior circulation. Standard T2 sequences were not performed. Skull and upper cervical spine:  Skull base is unremarkable. Sinuses/Orbits: The paranasal sinuses and mastoid air cells are clear. The globes and orbits are within normal limits. MRA HEAD FINDINGS Signal loss in the anterior genu of the cavernous internal carotid arteries bilaterally appears to be artifactual. No other focal stenosis is present in the internal carotid arteries from the high cervical segments through the ICA termini bilaterally. There is slight signal loss in the distal right A1 segment, likely artifactual. A1 and M1 vessels are otherwise within normal limits. MCA bifurcations are intact. Asymmetric attenuation of right MCA branches is noted. Fetal type posterior cerebral arteries are present bilaterally. The right posterior cerebral artery scratched at the right vertebral artery is the dominant vessel. There is no focal stenosis. Flow is present in the basilar artery. The superior cerebellar arteries are not visualized. There is irregular narrowing of the non dominant left vertebral artery with distal tapering suggesting high-grade stenosis or occlusion. IMPRESSION: 1. Acute/subacute nonhemorrhagic infarcts involving the cerebellum bilaterally, left greater than right. 2. Additional acute/subacute right periventricular white matter nonhemorrhagic infarct in the medial right temporal lobe. 3. Moderate generalized atrophy and white matter disease likely reflects the sequela of chronic microvascular ischemia. 4. High-grade stenosis or occlusion of the non dominant left vertebral artery. 5. Fetal type posterior cerebral arteries bilaterally. 6. Flow is present in the basilar artery. Basilar artery terminates at the superior cerebellar arteries with fetal type posterior cerebral arteries bilaterally. 7. No significant stenosis within the dominant right vertebral artery. Electronically Signed   By: San Morelle M.D.   On: 01/01/2019 06:22   Mr Brain Wo Contrast  Result Date: 01/02/2019 CLINICAL DATA:  Code stroke.  Unresponsive. EXAM: MRI HEAD WITHOUT CONTRAST MRA HEAD WITHOUT CONTRAST TECHNIQUE: Multiplanar, multiecho pulse sequences of the brain and surrounding structures were obtained without intravenous contrast. Angiographic images of the head were obtained using MRA technique without contrast. COMPARISON:  CT head without contrast 01/24/2019 FINDINGS: MRI HEAD FINDINGS Brain: The diffusion-weighted images confirm acute nonhemorrhagic infarcts of the cerebellum bilaterally, left greater than right. An additional right periventricular white matter nonhemorrhagic acute infarct is noted in the medial right temporal lobe. T2 FLAIR signal is associated with the  areas of acute infarction. Mild periventricular T2 hyperintensities are present as well. Moderate generalized atrophy is noted. The ventricles are proportionate to the degree of atrophy. No significant extraaxial fluid collection is present. Brainstem is within normal limits. Vascular: Flow voids are present in the anterior circulation. Standard T2 sequences were not performed. Skull and upper cervical spine: Skull base is unremarkable. Sinuses/Orbits: The paranasal sinuses and mastoid air cells are clear. The globes and orbits are within normal limits. MRA HEAD FINDINGS Signal loss in the anterior genu of the cavernous internal carotid arteries bilaterally appears to be artifactual. No other focal stenosis is present in the internal carotid arteries from the high cervical segments through the ICA termini bilaterally. There is slight signal loss in the distal right A1 segment, likely artifactual. A1 and M1 vessels are otherwise within normal limits. MCA bifurcations are intact. Asymmetric attenuation of right MCA branches is noted. Fetal type posterior cerebral arteries are present bilaterally. The right posterior cerebral artery scratched at the right vertebral artery is the dominant vessel. There is no focal stenosis. Flow is present in the basilar artery. The  superior cerebellar arteries are not visualized. There is irregular narrowing of the non dominant left vertebral artery with distal tapering suggesting high-grade stenosis or occlusion. IMPRESSION: 1. Acute/subacute nonhemorrhagic infarcts involving the cerebellum bilaterally, left greater than right. 2. Additional acute/subacute right periventricular white matter nonhemorrhagic infarct in the medial right temporal lobe. 3. Moderate generalized atrophy and white matter disease likely reflects the sequela of chronic microvascular ischemia. 4. High-grade stenosis or occlusion of the non dominant left vertebral artery. 5. Fetal type posterior cerebral arteries bilaterally. 6. Flow is present in the basilar artery. Basilar artery terminates at the superior cerebellar arteries with fetal type posterior cerebral arteries bilaterally. 7. No significant stenosis within the dominant right vertebral artery. Electronically Signed   By: San Morelle M.D.   On: 01/09/2019 06:22   Ct Head Code Stroke Wo Contrast  Result Date: 01/01/2019 CLINICAL DATA:  Code stroke. Focal neuro deficit of less than 6 hours. Stroke suspected. EXAM: CT HEAD WITHOUT CONTRAST TECHNIQUE: Contiguous axial images were obtained from the base of the skull through the vertex without intravenous contrast. COMPARISON:  CT head without contrast 12/14/2018 FINDINGS: Brain: Bilateral nonhemorrhagic superior cerebellar infarcts are new since the prior exam, more prominent left than right. No acute supratentorial lesion is present. Mild atrophy and white matter changes are stable, within normal limits for age. The ventricles are of normal size. No significant extraaxial fluid collection is present. Vascular: Atherosclerotic calcifications are present within the cavernous internal carotid arteries and at the dural margin of both vertebral arteries. There is no hyperdense vessel. Skull: Atherosclerotic calcifications are present within the cavernous  internal carotid arteries and at the dural margin the vertebral arteries bilaterally. There is mild density in the mid basilar artery and at the basilar tip raising concern for thrombus. Sinuses/Orbits: Calvarium is intact. No focal lytic or blastic lesions are present. Other: The paranasal sinuses and mastoid air cells are clear. Bilateral lens replacements are noted. Globes and orbits are otherwise unremarkable. ASPECTS [facility] Stroke Program Early CT Score) - Ganglionic level infarction (caudate, lentiform nuclei, internal capsule, insula, M1-M3 cortex): 7/7 - Supraganglionic infarction (M4-M6 cortex): 3/3 Total score (0-10 with 10 being normal): 10/10 IMPRESSION: 1. 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 Increased density of the basilar artery raises concern for thrombus. Recommend CTA of the head and neck for further evaluation 3. ASPECTS  is 10/10 The above was relayed via text pager to Dr. Samara Snide on 01/07/2019 at 04:58 . Electronically Signed   By: San Morelle M.D.   On: 12/27/2018 04:59   Cerebral Angio 01/17/2019 1.Occluded Lt VA at origin with no distal reconstitution. 2.Widely patent basilar artery.  EEG Abnormality - Continuous slow, generalized IMPRESSION: This study is suggestive of severe diffuse encephalopathy, nonspecific to etiology. No seizures or epileptiform discharges were seen throughout the recording.   PHYSICAL EXAM  Temp:  [97.8 F (36.6 C)-98.7 F (37.1 C)] 98.2 F (36.8 C) (12/01 0700) Pulse Rate:  [41-142] 121 (12/01 1000) Resp:  [14-27] 27 (12/01 1000) BP: (74-147)/(42-120) 125/94 (12/01 0900) SpO2:  [91 %-100 %] 99 % (12/01 1000) Arterial Line BP: (100-157)/(35-73) 115/49 (12/01 1000) FiO2 (%):  [30 %] 30 % (12/01 0800)  General - Well nourished, well developed, intubated on low-dose fentanyl.  Ophthalmologic - fundi not visualized due to noncooperation.  Cardiovascular - Regular  rate and rhythm.  Neuro - intubated on low-dose fentanyl, eyes open but not following commands. Pt intermittently agitated with bilateral upper extremity pushing and flailing, PERRL, corneal reflexes present bilaterally, gag and cough present. Breathing over the vent.  Facial symmetry not able to test due to ET tube.  Tongue protrusion not cooperative. On pain stimulation, he became agitated, BUE against gravity and flailng. BLE mild withdraw on pain stimulation. Sensation, coordination and gait not tested.   ASSESSMENT/PLAN Mr. Duane Burns is a 83 y.o. male with history of hypertension, hyperlipidemia, severe aortic stenosis, myasthenia gravis versus Guillain-Barr presenting to [facility] 11/20 and  transferred 11/20 to [facility] for sepsis, acute renal failure and acute respiratory failure in the setting of pulmonary edema and possible pneumonia. Found to have acute on chronic systolic heart failure w/ EF 20%. Extubated 11/26 until 11/30 0430 when he became suddenly unresponsive.  CT head showed L & R cerebellar infarcts, concern for hyperdense basilar artery.  MRI confirmed cerebell ar stroke but MRA did not confirm basilar thrombus. Intubated. Sent to IR for diagnostic cerebral angiogram.   Acute encephalopathy  11/30 Unresponsiveness, reported disconjugate eyes, clenching jaws and flaccid extremities  Received ativan with some improvement  Etiology not clear, could be seizure with post ictal. EEG no Sz but encephalopathy  Several factors for lowered seizure threshold, including AKI, severe leukocytosis and encephalopathy  Posterior stroke is in DDx but MRA and angiogram showed patent BA, the stroke on CT and MRI was subacute instead of acute, felt not able to explain pt presentation  Continue neuro check and seizure precaution  Continue to correct metabolic derangement   Stroke:   L> R cerebellar and R temporal lobe infarcts embolic secondary to unknown source -  low EF and possible AF 11/23  Code Stroke CT head L>R superior cerebellar infarcts. BA density ? Thrombus. ASPECTS 10.     MRI  L>R cerebellar infarcts. R periventricular white matter infarct medial R temporal lobe. Small vessel disease. Atrophy.   MRA L VA occlusion. B fetal PCAs. Flow present in BA.   Cerebral Angio L VA occlusion. Patent BA.   2D Echo EF 20-25%. Moderate to severe AS   EEG no sz. Severe diffuse encephalopathy  LE venous doppler no DVT  LDL 96  HgbA1c 6.2  Heparin 5000 units sq tid for VTE prophylaxis  aspirin 81 mg daily prior to admission, now on aspirin 81 mg daily and clopidogrel 75 mg daily.  Consider DOAC if aggressive care  pursued given severe cardiomyopathy, PAF and stroke.   Therapy recommendations:  pending   Disposition:  pending   Continues to deteriorate from cardiac standpoint. Palliative care following with ongoing discussion.   Possible Atrial Fibrillation  ? AF overnight on 11/23  Now in NSR w/ PVCs. No recurrent AF  Treated with IV heparin, now off  On DAPT now  Consider DOAC if aggressive care pursued  Cardiomyopathy  11/20 EF 30 to 35%  11/21 EF 20 to 25%  Treated for pulmonary edema  On DAPT  Consider DOAC if aggressive care pursued  Hypotension Hx Hypertension  Stable on the low end  Off Levophed . Avoid low BP . Long-term BP goal normotensive  AKI  Creatinine 2.07-2.3-2.29-2.59   On IV fluid  Management per primary team  acute hypoxic respiratory failure   in the setting of pulmonary edema and possible pneumonia.   Intubated->Extubated->Reintubated  Sedated with low-dose fentanyl  Septic shock  Tapered off Levophed and neo  Leukocytosis WBC 29.5-30.8-35.1   Afebrile  Off abx now   Hyperlipidemia  Home meds:  Fish oil and pravachol 80  Now on lipitor 80  LDL 96, goal < 70  Continue statin at discharge  Diabetes, type II, controlled  HgbA1c 6.2, goal < 7.0   SSI   CBG  monitoring  Malnutrition / Dysphagia . Secondary to stroke . NPO  Other Stroke Risk Factors  Advanced age  Hx Cigarette smoker  CAD w/ ischemic cardiac event, not a candidate for acute w/u  severe aortic stenosis, no longer a TAVR candidate  acute on chronic systolic heart failure w/ EF 20% d/t ischemia and sepsis  Other Active Problems  Hypokalemia  Hypernatremia   Urticaria ? R/t PCN  Cholecystitis w/ GB distension, resolved on own  Hx myasthenia gravis versus Guillain-Barr - on Mestinon and prednisone  Hospital day # 11  This patient is critically ill due to acute encephalopathy, stroke, possible seizure, cardiomyopathy, hypotension, sepsis and at significant risk of neurological worsening, death form recurrent stroke, status epilepticus, septic shock, cardiogenic shock, heart failure. This patient's care requires constant monitoring of vital signs, hemodynamics, respiratory and cardiac monitoring, review of multiple databases, neurological assessment, discussion with family, other specialists and medical decision making of high complexity. I spent 35 minutes of neurocritical care time in the care of this patient.  Rosalin Hawking, MD PhD Stroke Neurology 01/26/2019 10:36 AM    To contact Stroke Continuity provider, please refer to [email or web address] After hours, contact General Neurology

## 2019-01-26 NOTE — Progress Notes (Signed)
Chaplain engaged in follow-up visit with Mrs. Dunsworth.  During visit, Mrs. Gambles noted that her and Mr. Lazer had discussed what they wanted towards the end of their lives.  She stated that neither one of them wanted to be kept alive if they were unresponsive.  She also stated that she was able to have a conversation with her kids the night before and that they were all in agreement concerning Mr. Huebner care.  Mrs. Bramson voiced how hard it would be to live without Mr. Winns but is very aware of his wishes and what he would want.    Chaplain offered spiritual support and spiritual presence.  Chaplain will continue to follow-up.

## 2019-01-26 NOTE — Evaluation (Signed)
Physical Therapy Re-Evaluation Patient Details Name: Duane Burns MRN: [id number] DOB: 08-13-1935 Today's Date: 01/26/2019   History of Present Illness  Pt is an 83 y.o. male admitted 01/18/2019 with sepsis, developed chest pain and transferred to [facility]. Pt with possible cholecystitis but resolved. ETT 11/22-11/26. Code stroke called 11/30 AM due to unresponsiveness, imaging showed acute bilateral cerebellar infarcts; tPA not administered. S/p cerebral angiogram; re-intubated 11/30. PMH includes myasthenia gravis, HTN, arthritis, prostate cancer, L foot drop.    Clinical Impression  Pt seen for PT/OT re-evaluation s/p cerebellar infarcts and reintubated 11/30. Difficult to assess strength versus cognitive impairment; pt not following majority of simple commands, but noted strength in all four extremities, especially in BUEs as pt restless and attempting to pull at vent. Pt required maxA+2 to come to long sitting and reposition in bed; performing some automatic movements. Pt would benefit from continued acute PT services to maximize functional mobility and independence prior to d/c with SNF-level therapies.  SpO2 97% on vent 30% FiO2 peep 5cmH2O; BP 138/92, HR 102    Follow Up Recommendations SNF;Supervision/Assistance - 24 hour    Equipment Recommendations  (TBD)    Recommendations for Other Services       Precautions / Restrictions Precautions Precautions: Fall;Other (comment) Precaution Comments: Vent, multiple lines, bilateral soft mitts Required Braces or Orthoses: Other Brace Other Brace: uses Lt AFO which doesn't appear to be in pt's room Restrictions Weight Bearing Restrictions: No      Mobility  Bed Mobility Overal bed mobility: Needs Assistance Bed Mobility: Supine to Sit     Supine to sit: Max assist;+2 for physical assistance;HOB elevated     General bed mobility comments: MaxA+2 to come to long sit from elevated HOB, pt assisting with BUE support; deferred sitting  EOB due to pt not following commands and restless/agitated with vent  Transfers                    Ambulation/Gait                Stairs            Wheelchair Mobility    Modified Rankin (Stroke Patients Only)       Balance                                             Pertinent Vitals/Pain Pain Assessment: Faces Faces Pain Scale: Hurts a little bit Pain Location: Reaching to pull at vent Pain Descriptors / Indicators: Grimacing;Guarding Pain Intervention(s): Monitored during session;Repositioned    Home Living Family/patient expects to be discharged to:: Private residence Living Arrangements: Spouse/significant other Available Help at Discharge: Family Type of Home: House Home Access: Stairs to enter Entrance Stairs-Rails: Right;Left;Can reach both Technical brewer of Steps: 5-6 Home Layout: One level Home Equipment: Environmental consultant - 2 wheels;Other (comment) Additional Comments: taken from previous admission     Prior Function           Comments: Initial eval, pt poor historian but reports indep with mobility/ADLs/driving; today, pt intubated unable to provide info     Hand Dominance        Extremity/Trunk Assessment   Upper Extremity Assessment Upper Extremity Assessment: Difficult to assess due to impaired cognition;RUE deficits/detail;LUE deficits/detail RUE Deficits / Details: Strong grip with elbow flexion and reaching mouth/attempting to pull vent; minimal shoulder AROM to command  LUE Deficits / Details: Strong grip with elbow flexion and reaching mouth/attempting to pull vent; minimal shoulder AROM to command    Lower Extremity Assessment Lower Extremity Assessment: RLE deficits/detail;LLE deficits/detail RLE Deficits / Details: Not moving to command; wiggling toes, noted hip extension with pt's attempts to bridge hips/reposition; tolerated hip/knee PROM LLE Deficits / Details: Not moving to command; wiggling  toes, noted hip extension with pt's attempts to bridge hips/reposition; tolerated hip/knee PROM       Communication      Cognition Arousal/Alertness: Awake/alert Behavior During Therapy: Restless;Agitated Overall Cognitive Status: Difficult to assess                                 General Comments: Pt awake/alert/restless, following ~25% simple commands, not consistently moving to command (especially BLEs); attempting to pull at vent, difficult to redirect      General Comments General comments (skin integrity, edema, etc.): SpO2 96% on vent 30% FiO2 peep 5; HR 102; BP 138/92 (unsure if reliable as pt not relaxing arm)    Exercises     Assessment/Plan    PT Assessment Patient needs continued PT services  PT Problem List Decreased strength;Decreased activity tolerance;Decreased balance;Decreased mobility;Decreased cognition;Decreased knowledge of use of DME;Decreased safety awareness;Cardiopulmonary status limiting activity       PT Treatment Interventions DME instruction;Gait training;Functional mobility training;Stair training;Therapeutic activities;Therapeutic exercise;Balance training;Cognitive remediation;Patient/family education;Neuromuscular re-education    PT Goals (Current goals can be found in the Care Plan section)  Acute Rehab PT Goals PT Goal Formulation: Patient unable to participate in goal setting Time For Goal Achievement: 02/09/19 Potential to Achieve Goals: Fair    Frequency Min 2X/week   Barriers to discharge        Co-evaluation PT/OT/SLP Co-Evaluation/Treatment: Yes Reason for Co-Treatment: Complexity of the patient's impairments (multi-system involvement);Necessary to address cognition/behavior during functional activity;For patient/therapist safety PT goals addressed during session: Mobility/safety with mobility         AM-PAC PT "6 Clicks" Mobility  Outcome Measure Help needed turning from your back to your side while in a flat  bed without using bedrails?: A Lot Help needed moving from lying on your back to sitting on the side of a flat bed without using bedrails?: A Lot Help needed moving to and from a bed to a chair (including a wheelchair)?: Total Help needed standing up from a chair using your arms (e.g., wheelchair or bedside chair)?: Total Help needed to walk in hospital room?: Total Help needed climbing 3-5 steps with a railing? : Total 6 Click Score: 8    End of Session   Activity Tolerance: Treatment limited secondary to medical complications (Comment) Patient left: in bed;with call bell/phone within reach;with bed alarm set;with nursing/sitter in room;with restraints reapplied Nurse Communication: Mobility status PT Visit Diagnosis: Unsteadiness on feet (R26.81);Other abnormalities of gait and mobility (R26.89);Muscle weakness (generalized) (M62.81)    Time: [id number] PT Time Calculation (min) (ACUTE ONLY): 20 min   Charges:   PT Evaluation $PT Re-evaluation: [place], PT, DPT Acute Rehabilitation Services  Pager [phone] Office [phone]  Derry Lory 01/26/2019, 11:03 AM

## 2019-01-26 NOTE — Progress Notes (Signed)
Occupational Therapy Re-Evaluation Patient Details Name: Duane Burns MRN: [id number] DOB: May 25, 1935 Today's Date: 01/26/2019    History of present illness Pt is an 83 y.o. male admitted 01/22/2019 with sepsis, developed chest pain and transferred to [facility]. Pt with possible cholecystitis but resolved. ETT 11/22-11/26. Code stroke called 11/30 AM due to unresponsiveness, imaging showed acute bilateral cerebellar infarcts; tPA not administered. S/p cerebral angiogram; re-intubated 11/30. PMH includes myasthenia gravis, HTN, arthritis, prostate cancer, L foot drop.   OT comments  Patient seen for re-evaluation with PT s/p cerebellar infarcts bilaterally and re-intubated. Patient appears with [facility] strength in [place], but limited assessment due to cognition.  Following approximately 25% of simple 1 step commands, restless and agitated throughout session (only minimal time un-mitted to avoid pulling at lines/vent).  Patient requires max assist +2 for long sitting in bed, and total assist for all self care tasks at this time.  Patient with R gaze preference, limited scanning but pt limited by vent discomfort and cognition- further assessment needed. Continues to be limited by activity tolerance, cognition, balance.  Updated goals today, downgraded. Will follow acutely, dc plan remains appropriate.   SpO2 97% on vent 30% FiO2 peep 5cmH2O; BP 138/92, HR 102   Follow Up Recommendations  SNF;Supervision/Assistance - 24 hour    Equipment Recommendations  3 in 1 bedside commode    Recommendations for Other Services      Precautions / Restrictions Precautions Precautions: Fall;Other (comment) Precaution Comments: Vent, multiple lines, bilateral soft mitts Required Braces or Orthoses: Other Brace Other Brace: uses Lt AFO which doesn't appear to be in pt's room Restrictions Weight Bearing Restrictions: No       Mobility Bed Mobility Overal bed mobility: Needs Assistance Bed Mobility: Supine to Sit      Supine to sit: Max assist;+2 for physical assistance;HOB elevated     General bed mobility comments: MaxA+2 to come to long sit from elevated HOB, pt assisting with BUE support; deferred sitting EOB due to pt not following commands and restless/agitated with vent  Transfers                      Balance                                           ADL either performed or assessed with clinical judgement   ADL Overall ADL's : Needs assistance/impaired                                       General ADL Comments: requires total assist at this time for all self care     Vision   Additional Comments: difficult to assess, limited scanning with R gaze preference noted   Perception     Praxis      Cognition Arousal/Alertness: Awake/alert Behavior During Therapy: Restless;Agitated Overall Cognitive Status: Difficult to assess                                 General Comments: pt awake/alert/restless, attempting to pull at vent throughout session following approx 24% of simple commands        Exercises     Shoulder Instructions       General Comments SpO2  96% on vent 30% FiO2 peep 5; HR 102; BP 138/92 (unsure if reliable as pt not relaxing arm)    Pertinent Vitals/ Pain       Pain Assessment: Faces Faces Pain Scale: Hurts a little bit Pain Location: Reaching to pull at vent Pain Descriptors / Indicators: Grimacing;Guarding Pain Intervention(s): Monitored during session;Repositioned  Home Living Family/patient expects to be discharged to:: Private residence Living Arrangements: Spouse/significant other Available Help at Discharge: Family Type of Home: House Home Access: Stairs to enter Technical brewer of Steps: 5-6 Entrance Stairs-Rails: Right;Left;Can reach both Home Layout: One level     Bathroom Shower/Tub: Teacher, early years/pre: Handicapped height Bathroom Accessibility: Yes   Home  Equipment: Environmental consultant - 2 wheels;Other (comment)   Additional Comments: taken from previous admission       Prior Functioning/Environment          Comments: Initial eval, pt poor historian but reports indep with mobility/ADLs/driving; today, pt intubated unable to provide info   Frequency  Min 2X/week        Progress Toward Goals  OT Goals(current goals can now be found in the care plan section)  Progress towards OT goals: Not progressing toward goals - comment(change in medical status, new CVA )  Acute Rehab OT Goals Patient Stated Goal: none stated OT Goal Formulation: Patient unable to participate in goal setting  Plan Discharge plan remains appropriate;Frequency remains appropriate    Co-evaluation    PT/OT/SLP Co-Evaluation/Treatment: Yes Reason for Co-Treatment: Complexity of the patient's impairments (multi-system involvement);Necessary to address cognition/behavior during functional activity;For patient/therapist safety PT goals addressed during session: Mobility/safety with mobility OT goals addressed during session: ADL's and self-care      AM-PAC OT "6 Clicks" Daily Activity     Outcome Measure   Help from another person eating meals?: Total Help from another person taking care of personal grooming?: Total Help from another person toileting, which includes using toliet, bedpan, or urinal?: Total Help from another person bathing (including washing, rinsing, drying)?: Total Help from another person to put on and taking off regular upper body clothing?: Total Help from another person to put on and taking off regular lower body clothing?: Total 6 Click Score: 6    End of Session    OT Visit Diagnosis: Other abnormalities of gait and mobility (R26.89);Muscle weakness (generalized) (M62.81);Other symptoms and signs involving cognitive function   Activity Tolerance Treatment limited secondary to agitation   Patient Left in bed;with call bell/phone within  reach;with bed alarm set;with nursing/sitter in room   Nurse Communication Mobility status        Time: [id number] OT Time Calculation (min): 20 min  Charges: OT General Charges $OT Visit: 1 Visit OT Evaluation $OT Re-eval: 1 Re-eval  Delight Stare, [place] Pager [phone] Office [phone]    Delight Stare 01/26/2019, 1:00 PM

## 2019-01-26 NOTE — Progress Notes (Signed)
[place] Progress Note Patient Name: Duane Burns DOB: 1935/06/12 MRN: [id number]   Date of Service  01/26/2019  HPI/Events of Note  Agitation - Request for bilateral soft wrist restraints.   eICU Interventions  Will order: 1. Bilateral soft wrist restraints X 8 hours.      Intervention Category Major Interventions: Delirium, psychosis, severe agitation - evaluation and management  [name] 01/26/2019, 12:10 AM

## 2019-01-26 NOTE — Progress Notes (Signed)
Referring Physician(s): Code Stroke- Greta Doom  Supervising Physician: Luanne Bras  Patient Status:  [facility] - In-pt  Chief Complaint: None- intubated with sedation  Subjective:  History of acute CVA (bilateral cerebellum infarcts, L>R) s/p image-guided diagnostic cerebral arteriogram 01/03/2019 by Dr. Estanislado Pandy revealing occluded left VA at the origin with widely patent basilar artery. Patient laying in bed intubated with sedation. He opens eyes to voice but does not follow simple commands. Right groin incision c/d/i.   Allergies: Penicillins  Medications: Prior to Admission medications   Medication Sig Start Date End Date Taking? Authorizing Provider  amoxicillin-clavulanate (AUGMENTIN) 875-125 MG tablet Take 1 tablet by mouth 2 (two) times daily. 12/24/18  Yes [provider]  aspirin EC 81 MG tablet Take 81 mg by mouth daily.   Yes [provider]  cholecalciferol (VITAMIN D) 1000 UNITS tablet Take 1,000 Units by mouth daily.   Yes [provider]  lisinopril (ZESTRIL) 5 MG tablet TAKE 1 TABLET BY MOUTH EVERY DAY 11/12/18  Yes Ronnie Doss M, DO  Multiple Vitamin (MULTIVITAMIN WITH MINERALS) TABS tablet Take 1 tablet by mouth daily.   Yes [provider]  Omega-3 Fatty Acids (FISH OIL) 1200 MG CAPS Take 1-2 capsules (1,200-2,400 mg total) by mouth 2 (two) times daily. RESTART IN 1 WEEK IF YOUR SWALLOWING CONTINUES TO IMPROVE. Take 2 capsules in the morning and 1 capsule in the evening 11/16/14  Yes Rexene Alberts, MD  pravastatin (PRAVACHOL) 80 MG tablet Take 1 tablet (80 mg total) by mouth every evening. 12/28/18  Yes Gottschalk, Leatrice Jewels M, DO  predniSONE (DELTASONE) 10 MG tablet Take 10 mg by mouth daily. 04/07/17  Yes [provider]  pyridostigmine (MESTINON) 60 MG tablet Take 60 mg by mouth 3 (three) times daily.    Yes [provider]     Vital Signs: BP (!) 97/59    Pulse 77    Temp 98.4 F (36.9  C)    Resp 16    Ht 5\' 8"  (1.727 m)    Wt 171 lb 15.3 oz (78 kg)    SpO2 96%    BMI 26.15 kg/m   Physical Exam Constitutional:      General: He is not in acute distress.    Comments: Intubated with sedation.  Pulmonary:     Effort: Pulmonary effort is normal. No respiratory distress.     Comments: Intubated with sedation. Skin:    General: Skin is warm and dry.     Comments: Right groin incision soft without active bleeding or hematoma.  Neurological:     Comments: Intubated with sedation. Distal pulses 1+ bilaterally.     Imaging: Dg Abd 1 View  Result Date: 01/03/2019 CLINICAL DATA:  OG tube placement. EXAM: ABDOMEN - 1 VIEW COMPARISON:  Chest x-ray 01/24/2019. FINDINGS: A G-tube noted with tip coiled in the stomach. No gastric or bowel distention. No free air. Right IJ line noted with tip over cavoatrial junction. IMPRESSION: OG tube noted with tip coiled in the stomach. Electronically Signed   By: Marcello Moores  Register   On: 01/16/2019 11:34   Mr Angio Head Wo Contrast  Result Date: 01/22/2019 CLINICAL DATA:  Code stroke. Unresponsive. EXAM: MRI HEAD WITHOUT CONTRAST MRA HEAD WITHOUT CONTRAST TECHNIQUE: Multiplanar, multiecho pulse sequences of the brain and surrounding structures were obtained without intravenous contrast. Angiographic images of the head were obtained using MRA technique without contrast. COMPARISON:  CT head without contrast 01/24/2019 FINDINGS: MRI HEAD FINDINGS Brain: The  diffusion-weighted images confirm acute nonhemorrhagic infarcts of the cerebellum bilaterally, left greater than right. An additional right periventricular white matter nonhemorrhagic acute infarct is noted in the medial right temporal lobe. T2 FLAIR signal is associated with the areas of acute infarction. Mild periventricular T2 hyperintensities are present as well. Moderate generalized atrophy is noted. The ventricles are proportionate to the degree of atrophy. No significant extraaxial fluid  collection is present. Brainstem is within normal limits. Vascular: Flow voids are present in the anterior circulation. Standard T2 sequences were not performed. Skull and upper cervical spine: Skull base is unremarkable. Sinuses/Orbits: The paranasal sinuses and mastoid air cells are clear. The globes and orbits are within normal limits. MRA HEAD FINDINGS Signal loss in the anterior genu of the cavernous internal carotid arteries bilaterally appears to be artifactual. No other focal stenosis is present in the internal carotid arteries from the high cervical segments through the ICA termini bilaterally. There is slight signal loss in the distal right A1 segment, likely artifactual. A1 and M1 vessels are otherwise within normal limits. MCA bifurcations are intact. Asymmetric attenuation of right MCA branches is noted. Fetal type posterior cerebral arteries are present bilaterally. The right posterior cerebral artery scratched at the right vertebral artery is the dominant vessel. There is no focal stenosis. Flow is present in the basilar artery. The superior cerebellar arteries are not visualized. There is irregular narrowing of the non dominant left vertebral artery with distal tapering suggesting high-grade stenosis or occlusion. IMPRESSION: 1. Acute/subacute nonhemorrhagic infarcts involving the cerebellum bilaterally, left greater than right. 2. Additional acute/subacute right periventricular white matter nonhemorrhagic infarct in the medial right temporal lobe. 3. Moderate generalized atrophy and white matter disease likely reflects the sequela of chronic microvascular ischemia. 4. High-grade stenosis or occlusion of the non dominant left vertebral artery. 5. Fetal type posterior cerebral arteries bilaterally. 6. Flow is present in the basilar artery. Basilar artery terminates at the superior cerebellar arteries with fetal type posterior cerebral arteries bilaterally. 7. No significant stenosis within the  dominant right vertebral artery. Electronically Signed   By: San Morelle M.D.   On: 01/06/2019 06:22   Mr Brain Wo Contrast  Result Date: 01/07/2019 CLINICAL DATA:  Code stroke. Unresponsive. EXAM: MRI HEAD WITHOUT CONTRAST MRA HEAD WITHOUT CONTRAST TECHNIQUE: Multiplanar, multiecho pulse sequences of the brain and surrounding structures were obtained without intravenous contrast. Angiographic images of the head were obtained using MRA technique without contrast. COMPARISON:  CT head without contrast 12/28/2018 FINDINGS: MRI HEAD FINDINGS Brain: The diffusion-weighted images confirm acute nonhemorrhagic infarcts of the cerebellum bilaterally, left greater than right. An additional right periventricular white matter nonhemorrhagic acute infarct is noted in the medial right temporal lobe. T2 FLAIR signal is associated with the areas of acute infarction. Mild periventricular T2 hyperintensities are present as well. Moderate generalized atrophy is noted. The ventricles are proportionate to the degree of atrophy. No significant extraaxial fluid collection is present. Brainstem is within normal limits. Vascular: Flow voids are present in the anterior circulation. Standard T2 sequences were not performed. Skull and upper cervical spine: Skull base is unremarkable. Sinuses/Orbits: The paranasal sinuses and mastoid air cells are clear. The globes and orbits are within normal limits. MRA HEAD FINDINGS Signal loss in the anterior genu of the cavernous internal carotid arteries bilaterally appears to be artifactual. No other focal stenosis is present in the internal carotid arteries from the high cervical segments through the ICA termini bilaterally. There is slight signal loss in the distal right  A1 segment, likely artifactual. A1 and M1 vessels are otherwise within normal limits. MCA bifurcations are intact. Asymmetric attenuation of right MCA branches is noted. Fetal type posterior cerebral arteries are present  bilaterally. The right posterior cerebral artery scratched at the right vertebral artery is the dominant vessel. There is no focal stenosis. Flow is present in the basilar artery. The superior cerebellar arteries are not visualized. There is irregular narrowing of the non dominant left vertebral artery with distal tapering suggesting high-grade stenosis or occlusion. IMPRESSION: 1. Acute/subacute nonhemorrhagic infarcts involving the cerebellum bilaterally, left greater than right. 2. Additional acute/subacute right periventricular white matter nonhemorrhagic infarct in the medial right temporal lobe. 3. Moderate generalized atrophy and white matter disease likely reflects the sequela of chronic microvascular ischemia. 4. High-grade stenosis or occlusion of the non dominant left vertebral artery. 5. Fetal type posterior cerebral arteries bilaterally. 6. Flow is present in the basilar artery. Basilar artery terminates at the superior cerebellar arteries with fetal type posterior cerebral arteries bilaterally. 7. No significant stenosis within the dominant right vertebral artery. Electronically Signed   By: San Morelle M.D.   On: 01/19/2019 06:22   Dg Chest Port 1 View  Result Date: 01/24/2019 CLINICAL DATA:  Respiratory failure, code sepsis. EXAM: PORTABLE CHEST 1 VIEW COMPARISON:  Chest x-rays dated 01/23/2019 FINDINGS: RIGHT IJ central line appears well positioned with tip at the level of the lower SVC/cavoatrial junction. Heart size and mediastinal contours are stable. Probable mild bibasilar atelectasis. Mild central pulmonary vascular congestion. Stable elevation of the RIGHT hemidiaphragm. IMPRESSION: 1. Mild central pulmonary vascular congestion suggesting mild CHF/volume overload. No evidence of pneumonia or overt alveolar pulmonary edema. 2. Probable mild bibasilar atelectasis. Electronically Signed   By: Franki Cabot M.D.   On: 01/24/2019 06:19   Dg Chest Port 1 View  Result Date:  01/23/2019 CLINICAL DATA:  Cough for 2 days EXAM: PORTABLE CHEST 1 VIEW COMPARISON:  Chest radiograph 01/23/2019 FINDINGS: Right IJ central venous catheter tip projects over the superior vena cava. Monitoring leads overlie the patient. Stable cardiac and mediastinal contours. Aortic atherosclerosis. Elevation right hemidiaphragm. Bilateral interstitial pulmonary opacities. No pleural effusion or pneumothorax. IMPRESSION: Mild interstitial opacities bilaterally may represent edema. Elevation right hemidiaphragm. Electronically Signed   By: Lovey Newcomer M.D.   On: 01/23/2019 18:00   Am Dg Chest Port 1 View  Result Date: 01/23/2019 CLINICAL DATA:  Respiratory failure. EXAM: PORTABLE CHEST 1 VIEW COMPARISON:  Chest x-ray dated 01/21/2019. FINDINGS: Endotracheal tube is been removed. RIGHT IJ central line remains well positioned with tip at the level of the lower SVC. Heart size and mediastinal contours are stable. Stable mild central pulmonary vascular congestion. Lungs otherwise clear. No pleural effusion or pneumothorax is seen. IMPRESSION: 1. Stable mild central pulmonary vascular congestion. No overt alveolar pulmonary edema. 2. No evidence of pneumonia. 3. Endotracheal tube has been removed. Electronically Signed   By: Franki Cabot M.D.   On: 01/23/2019 07:55   Ct Head Code Stroke Wo Contrast  Result Date: 01/17/2019 CLINICAL DATA:  Code stroke. Focal neuro deficit of less than 6 hours. Stroke suspected. EXAM: CT HEAD WITHOUT CONTRAST TECHNIQUE: Contiguous axial images were obtained from the base of the skull through the vertex without intravenous contrast. COMPARISON:  CT head without contrast 12/14/2018 FINDINGS: Brain: Bilateral nonhemorrhagic superior cerebellar infarcts are new since the prior exam, more prominent left than right. No acute supratentorial lesion is present. Mild atrophy and white matter changes are stable, within normal limits for  age. The ventricles are of normal size. No  significant extraaxial fluid collection is present. Vascular: Atherosclerotic calcifications are present within the cavernous internal carotid arteries and at the dural margin of both vertebral arteries. There is no hyperdense vessel. Skull: Atherosclerotic calcifications are present within the cavernous internal carotid arteries and at the dural margin the vertebral arteries bilaterally. There is mild density in the mid basilar artery and at the basilar tip raising concern for thrombus. Sinuses/Orbits: Calvarium is intact. No focal lytic or blastic lesions are present. Other: The paranasal sinuses and mastoid air cells are clear. Bilateral lens replacements are noted. Globes and orbits are otherwise unremarkable. ASPECTS [facility] Stroke Program Early CT Score) - Ganglionic level infarction (caudate, lentiform nuclei, internal capsule, insula, M1-M3 cortex): 7/7 - Supraganglionic infarction (M4-M6 cortex): 3/3 Total score (0-10 with 10 being normal): 10/10 IMPRESSION: 1. 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 Increased density of the basilar artery raises concern for thrombus. Recommend CTA of the head and neck for further evaluation 3. ASPECTS is 10/10 The above was relayed via text pager to Dr. Samara Snide on 01/03/2019 at 04:58 . Electronically Signed   By: San Morelle M.D.   On: 01/24/2019 04:59   Vas [place] Lower Extremity Venous (dvt)  Result Date: 01/26/2019  Lower Venous Study Indications: Stroke.  Limitations: Bandages. Comparison Study: no prior Performing Technologist: Abram Sander RVS  Examination Guidelines: A complete evaluation includes B-mode imaging, spectral Doppler, color Doppler, and power Doppler as needed of all accessible portions of each vessel. Bilateral testing is considered an integral part of a complete examination. Limited examinations for reoccurring indications may be performed as noted.   +---------+---------------+---------+-----------+----------+--------------+  RIGHT     Compressibility Phasicity Spontaneity Properties Thrombus Aging  +---------+---------------+---------+-----------+----------+--------------+  CFV                                                        Not visualized  +---------+---------------+---------+-----------+----------+--------------+  SFJ                                                        Not visualized  +---------+---------------+---------+-----------+----------+--------------+  FV Prox                                                    Not visualized  +---------+---------------+---------+-----------+----------+--------------+  FV Mid    Full                                                             +---------+---------------+---------+-----------+----------+--------------+  FV Distal Full                                                             +---------+---------------+---------+-----------+----------+--------------+  PFV                                                        Not visualized  +---------+---------------+---------+-----------+----------+--------------+  POP       Full            Yes       Yes                                    +---------+---------------+---------+-----------+----------+--------------+  PTV       Full                                                             +---------+---------------+---------+-----------+----------+--------------+  PERO      Full                                                             +---------+---------------+---------+-----------+----------+--------------+   +---------+---------------+---------+-----------+----------+--------------+  LEFT      Compressibility Phasicity Spontaneity Properties Thrombus Aging  +---------+---------------+---------+-----------+----------+--------------+  CFV       Full            Yes       Yes                                     +---------+---------------+---------+-----------+----------+--------------+  SFJ       Full                                                             +---------+---------------+---------+-----------+----------+--------------+  FV Prox   Full                                                             +---------+---------------+---------+-----------+----------+--------------+  FV Mid    Full                                                             +---------+---------------+---------+-----------+----------+--------------+  FV Distal Full                                                             +---------+---------------+---------+-----------+----------+--------------+  PFV       Full                                                             +---------+---------------+---------+-----------+----------+--------------+  POP       Full            Yes       Yes                                    +---------+---------------+---------+-----------+----------+--------------+  PTV       Full                                                             +---------+---------------+---------+-----------+----------+--------------+  PERO                                                       Not visualized  +---------+---------------+---------+-----------+----------+--------------+     Summary: Right: There is no evidence of deep vein thrombosis in the lower extremity. However, portions of this examination were limited- see technologist comments above. No cystic structure found in the popliteal fossa. Left: There is no evidence of deep vein thrombosis in the lower extremity. No cystic structure found in the popliteal fossa.  *See table(s) above for measurements and observations. Electronically signed by Deitra Mayo MD on 01/26/2019 at 12:19:37 PM.    Final     Labs:  CBC: Recent Labs    01/22/19 0440 01/23/19 0412 01/24/19 0330  12/29/2018 0702 01/24/2019 1013 01/12/2019 1025 01/22/2019 1535  WBC 31.3* 29.5*  30.8*  --   --   --  35.1*  --   HGB 11.3* 11.5* 10.3*   < > 9.5* 9.2* 10.0* 9.9*  HCT 35.2* 35.4* 31.6*   < > 28.0* 27.0* 30.9* 29.0*  PLT 158 150 167  --   --   --  233  --    < > = values in this interval not displayed.    COAGS: Recent Labs    01/04/2019 1207 01/16/19 0230 01/17/19 1442  INR 1.2 1.5* 1.4*  APTT 29  --   --     BMP: Recent Labs    01/23/19 0412 01/24/19 0330  01/05/2019 0231  12/31/2018 0702 01/04/2019 1013 12/30/2018 1535 01/26/19 0500  NA 153* 151*  --  146*   < > 145 144 144 149*  K 3.3* 3.3*   < > 3.4*   < > 3.5 3.4* 4.4 4.2  CL 114* 117*  --  113*  --   --   --   --  115*  CO2 24 24  --  22  --   --   --   --  16*  GLUCOSE 89 118*  --  94  --   --   --   --  107*  BUN 72* 69*  --  61*  --   --   --   --  63*  CALCIUM 8.2* 8.1*  --  7.9*  --   --   --   --  8.2*  CREATININE 2.07* 2.38*  --  2.29*  --   --   --   --  2.59*  GFRNONAA 29* 25*  --  26*  --   --   --   --  22*  GFRAA 34* 29*  --  30*  --   --   --   --  26*   < > = values in this interval not displayed.    LIVER FUNCTION TESTS: Recent Labs    01/16/19 0230 01/16/19 1024 01/17/19 0229 01/19/19 0345  BILITOT 0.8 0.8 1.0 2.5*  AST 384* 544* 410* 78*  ALT 73* 183* 159* 88*  ALKPHOS 53 65 72 82  PROT 5.3* 5.6* 5.5* 5.1*  ALBUMIN 2.7* 2.9* 2.6* 2.4*    Assessment and Plan:  History of acute CVA (bilateral cerebellum infarcts, L>R) s/p image-guided diagnostic cerebral arteriogram 12/28/2018 by Dr. Estanislado Pandy revealing occluded left VA at the origin with widely patent basilar artery. Right groin incision stable. Further plans per CCM/neurology/cardiology- appreciate and agree with management. Please call NIR with questions/concerns.   Electronically Signed: Earley Abide, PA-C 01/26/2019, 2:00 PM   I spent a total of 15 Minutes at the the patient's bedside AND on the patient's hospital floor or unit, greater than 50% of which was counseling/coordinating care for acute CVA s/p  diagnostic cerebal arteriogram.

## 2019-01-26 NOTE — Progress Notes (Signed)
Lower extremity venous has been completed.   Preliminary results in CV Proc.   Abram Sander 01/26/2019 10:49 AM

## 2019-01-26 NOTE — Progress Notes (Signed)
Progress Note  Patient Name: Duane Burns Date of Encounter: 01/26/2019  Primary Cardiologist: Kate Sable, MD   Subjective   Remains intubated. Awake on vent. Very agitated. Precedex switched to Fentanyl due to bradycardia. Seems to try and make some purposeful movements but won't follow any commands  Off NE.  Co-ox 72%  CVP 8  Creatinine continues to rise. Now 2.69.    Inpatient Medications    Scheduled Meds:  aspirin  81 mg Per Tube Daily   atorvastatin  80 mg Per Tube q1800   chlorhexidine gluconate (MEDLINE KIT)  15 mL Mouth Rinse BID   Chlorhexidine Gluconate Cloth  6 each Topical Daily   cholecalciferol  1,000 Units Per Tube Daily   clopidogrel  75 mg Per Tube Daily   sennosides  5 mL Per Tube BID   And   docusate  50 mg Per Tube BID   feeding supplement (PRO-STAT SUGAR FREE 64)  30 mL Per Tube BID   Gerhardt's butt cream   Topical BID   heparin injection (subcutaneous)  5,000 Units Subcutaneous Q8H   predniSONE  10 mg Per Tube Q breakfast   pyridostigmine  60 mg Per Tube TID   sodium chloride flush  10-40 mL Intracatheter Q12H   sodium chloride flush  3 mL Intravenous Q12H   Continuous Infusions:  sodium chloride 10 mL/hr at 01/24/19 2234   dexmedetomidine (PRECEDEX) IV infusion Stopped (01/09/2019 1902)   famotidine (PEPCID) IV 20 mg (01/06/2019 2147)   feeding supplement (VITAL AF 1.2 CAL)     fentaNYL infusion INTRAVENOUS 125 mcg/hr (01/26/19 0600)   norepinephrine (LEVOPHED) Adult infusion Stopped (01/26/19 0246)   PRN Meds: sodium chloride, acetaminophen **OR** acetaminophen, bisacodyl, diphenhydrAMINE, fentaNYL (SUBLIMAZE) injection, guaiFENesin, haloperidol lactate, levalbuterol, midazolam, ondansetron **OR** ondansetron (ZOFRAN) IV, polyethylene glycol, sennosides, sodium chloride flush, sodium chloride flush, traZODone   Vital Signs    Vitals:   01/26/19 0600 01/26/19 0700 01/26/19 0800 01/26/19 0900  BP: (!) 143/120  118/72  (!) 125/94  Pulse: 90 62  93  Resp: (!) 22 (!) 23  18  Temp:  98.2 F (36.8 C)    TempSrc:  Oral    SpO2: 95% 98% 98% 98%  Weight:      Height:        Intake/Output Summary (Last 24 hours) at 01/26/2019 0942 Last data filed at 01/26/2019 0800 Gross per 24 hour  Intake 706.49 ml  Output 1385 ml  Net -678.51 ml   Filed Weights   01/23/19 0500 01/24/19 0331 01/02/2019 0205  Weight: 78.5 kg 76.9 kg 78 kg    Telemetry    Sinus 90s with frequent PVCs Personally reviewed  Physical Exam   General:  Ill appearing. Elderly. Agitated on vent  HEENT: normal + ETT  Neck: supple. JVP 8 Carotids 2+ bilat; no bruits. No lymphadenopathy or thryomegaly appreciated. Cor: PMI nondisplaced. Iregular rate & rhythm. No rubs, gallops or murmurs. Lungs: coarse Abdomen: soft, nontender, nondistended. No hepatosplenomegaly. No bruits or masses. Good bowel sounds. Extremities: no cyanosis, clubbing, rash, edema Neuro: awake on vent agitated. Won't follow commands  Labs    Chemistry Recent Labs  Lab 01/24/19 0330  01/14/2019 0231  01/02/2019 1013 01/13/2019 1535 01/26/19 0500  NA 151*  --  146*   < > 144 144 149*  K 3.3*   < > 3.4*   < > 3.4* 4.4 4.2  CL 117*  --  113*  --   --   --  115*  CO2 24  --  22  --   --   --  16*  GLUCOSE 118*  --  94  --   --   --  107*  BUN 69*  --  61*  --   --   --  63*  CREATININE 2.38*  --  2.29*  --   --   --  2.59*  CALCIUM 8.1*  --  7.9*  --   --   --  8.2*  GFRNONAA 25*  --  26*  --   --   --  22*  GFRAA 29*  --  30*  --   --   --  26*  ANIONGAP 10  --  11  --   --   --  18*   < > = values in this interval not displayed.     Hematology Recent Labs  Lab 01/23/19 0412 01/24/19 0330  01/14/2019 1013 01/24/2019 1025 01/14/2019 1535  WBC 29.5* 30.8*  --   --  35.1*  --   RBC 3.92* 3.51*  --   --  3.41*  --   HGB 11.5* 10.3*   < > 9.2* 10.0* 9.9*  HCT 35.4* 31.6*   < > 27.0* 30.9* 29.0*  MCV 90.3 90.0  --   --  90.6  --   MCH 29.3 29.3  --   --   29.3  --   MCHC 32.5 32.6  --   --  32.4  --   RDW 14.3 14.6  --   --  14.6  --   PLT 150 167  --   --  233  --    < > = values in this interval not displayed.    Cardiac EnzymesNo results for input(s): TROPONINI in the last 168 hours. No results for input(s): TROPIPOC in the last 168 hours.   BNP No results for input(s): BNP, PROBNP in the last 168 hours.   DDimer No results for input(s): DDIMER in the last 168 hours.   Radiology    Dg Abd 1 View  Result Date: 01/22/2019 CLINICAL DATA:  OG tube placement. EXAM: ABDOMEN - 1 VIEW COMPARISON:  Chest x-ray 01/24/2019. FINDINGS: A G-tube noted with tip coiled in the stomach. No gastric or bowel distention. No free air. Right IJ line noted with tip over cavoatrial junction. IMPRESSION: OG tube noted with tip coiled in the stomach. Electronically Signed   By: Marcello Moores  Register   On: 01/20/2019 11:34   Mr Angio Head Wo Contrast  Result Date: 01/07/2019 CLINICAL DATA:  Code stroke. Unresponsive. EXAM: MRI HEAD WITHOUT CONTRAST MRA HEAD WITHOUT CONTRAST TECHNIQUE: Multiplanar, multiecho pulse sequences of the brain and surrounding structures were obtained without intravenous contrast. Angiographic images of the head were obtained using MRA technique without contrast. COMPARISON:  CT head without contrast 01/09/2019 FINDINGS: MRI HEAD FINDINGS Brain: The diffusion-weighted images confirm acute nonhemorrhagic infarcts of the cerebellum bilaterally, left greater than right. An additional right periventricular white matter nonhemorrhagic acute infarct is noted in the medial right temporal lobe. T2 FLAIR signal is associated with the areas of acute infarction. Mild periventricular T2 hyperintensities are present as well. Moderate generalized atrophy is noted. The ventricles are proportionate to the degree of atrophy. No significant extraaxial fluid collection is present. Brainstem is within normal limits. Vascular: Flow voids are present in the anterior  circulation. Standard T2 sequences were not performed. Skull and upper cervical spine: Skull base is unremarkable. Sinuses/Orbits:  The paranasal sinuses and mastoid air cells are clear. The globes and orbits are within normal limits. MRA HEAD FINDINGS Signal loss in the anterior genu of the cavernous internal carotid arteries bilaterally appears to be artifactual. No other focal stenosis is present in the internal carotid arteries from the high cervical segments through the ICA termini bilaterally. There is slight signal loss in the distal right A1 segment, likely artifactual. A1 and M1 vessels are otherwise within normal limits. MCA bifurcations are intact. Asymmetric attenuation of right MCA branches is noted. Fetal type posterior cerebral arteries are present bilaterally. The right posterior cerebral artery scratched at the right vertebral artery is the dominant vessel. There is no focal stenosis. Flow is present in the basilar artery. The superior cerebellar arteries are not visualized. There is irregular narrowing of the non dominant left vertebral artery with distal tapering suggesting high-grade stenosis or occlusion. IMPRESSION: 1. Acute/subacute nonhemorrhagic infarcts involving the cerebellum bilaterally, left greater than right. 2. Additional acute/subacute right periventricular white matter nonhemorrhagic infarct in the medial right temporal lobe. 3. Moderate generalized atrophy and white matter disease likely reflects the sequela of chronic microvascular ischemia. 4. High-grade stenosis or occlusion of the non dominant left vertebral artery. 5. Fetal type posterior cerebral arteries bilaterally. 6. Flow is present in the basilar artery. Basilar artery terminates at the superior cerebellar arteries with fetal type posterior cerebral arteries bilaterally. 7. No significant stenosis within the dominant right vertebral artery. Electronically Signed   By: San Morelle M.D.   On: 12/29/2018 06:22    Mr Brain Wo Contrast  Result Date: 12/29/2018 CLINICAL DATA:  Code stroke. Unresponsive. EXAM: MRI HEAD WITHOUT CONTRAST MRA HEAD WITHOUT CONTRAST TECHNIQUE: Multiplanar, multiecho pulse sequences of the brain and surrounding structures were obtained without intravenous contrast. Angiographic images of the head were obtained using MRA technique without contrast. COMPARISON:  CT head without contrast 12/28/2018 FINDINGS: MRI HEAD FINDINGS Brain: The diffusion-weighted images confirm acute nonhemorrhagic infarcts of the cerebellum bilaterally, left greater than right. An additional right periventricular white matter nonhemorrhagic acute infarct is noted in the medial right temporal lobe. T2 FLAIR signal is associated with the areas of acute infarction. Mild periventricular T2 hyperintensities are present as well. Moderate generalized atrophy is noted. The ventricles are proportionate to the degree of atrophy. No significant extraaxial fluid collection is present. Brainstem is within normal limits. Vascular: Flow voids are present in the anterior circulation. Standard T2 sequences were not performed. Skull and upper cervical spine: Skull base is unremarkable. Sinuses/Orbits: The paranasal sinuses and mastoid air cells are clear. The globes and orbits are within normal limits. MRA HEAD FINDINGS Signal loss in the anterior genu of the cavernous internal carotid arteries bilaterally appears to be artifactual. No other focal stenosis is present in the internal carotid arteries from the high cervical segments through the ICA termini bilaterally. There is slight signal loss in the distal right A1 segment, likely artifactual. A1 and M1 vessels are otherwise within normal limits. MCA bifurcations are intact. Asymmetric attenuation of right MCA branches is noted. Fetal type posterior cerebral arteries are present bilaterally. The right posterior cerebral artery scratched at the right vertebral artery is the dominant  vessel. There is no focal stenosis. Flow is present in the basilar artery. The superior cerebellar arteries are not visualized. There is irregular narrowing of the non dominant left vertebral artery with distal tapering suggesting high-grade stenosis or occlusion. IMPRESSION: 1. Acute/subacute nonhemorrhagic infarcts involving the cerebellum bilaterally, left greater than right. 2. Additional  acute/subacute right periventricular white matter nonhemorrhagic infarct in the medial right temporal lobe. 3. Moderate generalized atrophy and white matter disease likely reflects the sequela of chronic microvascular ischemia. 4. High-grade stenosis or occlusion of the non dominant left vertebral artery. 5. Fetal type posterior cerebral arteries bilaterally. 6. Flow is present in the basilar artery. Basilar artery terminates at the superior cerebellar arteries with fetal type posterior cerebral arteries bilaterally. 7. No significant stenosis within the dominant right vertebral artery. Electronically Signed   By: San Morelle M.D.   On: 01/12/2019 06:22   Ct Head Code Stroke Wo Contrast  Result Date: 12/31/2018 CLINICAL DATA:  Code stroke. Focal neuro deficit of less than 6 hours. Stroke suspected. EXAM: CT HEAD WITHOUT CONTRAST TECHNIQUE: Contiguous axial images were obtained from the base of the skull through the vertex without intravenous contrast. COMPARISON:  CT head without contrast 12/14/2018 FINDINGS: Brain: Bilateral nonhemorrhagic superior cerebellar infarcts are new since the prior exam, more prominent left than right. No acute supratentorial lesion is present. Mild atrophy and white matter changes are stable, within normal limits for age. The ventricles are of normal size. No significant extraaxial fluid collection is present. Vascular: Atherosclerotic calcifications are present within the cavernous internal carotid arteries and at the dural margin of both vertebral arteries. There is no hyperdense  vessel. Skull: Atherosclerotic calcifications are present within the cavernous internal carotid arteries and at the dural margin the vertebral arteries bilaterally. There is mild density in the mid basilar artery and at the basilar tip raising concern for thrombus. Sinuses/Orbits: Calvarium is intact. No focal lytic or blastic lesions are present. Other: The paranasal sinuses and mastoid air cells are clear. Bilateral lens replacements are noted. Globes and orbits are otherwise unremarkable. ASPECTS [facility] Stroke Program Early CT Score) - Ganglionic level infarction (caudate, lentiform nuclei, internal capsule, insula, M1-M3 cortex): 7/7 - Supraganglionic infarction (M4-M6 cortex): 3/3 Total score (0-10 with 10 being normal): 10/10 IMPRESSION: 1. 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 Increased density of the basilar artery raises concern for thrombus. Recommend CTA of the head and neck for further evaluation 3. ASPECTS is 10/10 The above was relayed via text pager to Dr. Samara Snide on 01/12/2019 at 04:58 . Electronically Signed   By: San Morelle M.D.   On: 01/12/2019 04:59    Cardiac Studies   Echo 11/21  1. Left ventricular ejection fraction, by visual estimation, is 20 to 25%. The left ventricle has severely decreased function. There is no left ventricular hypertrophy.  2. Diffuse hypokinesis with most prominent severe hypokinesis of the anteroseptal wall and the entire apex.  3. Global right ventricle was not assessed.The right ventricular size is not assessed. Right vetricular wall thickness was not assessed.  4. Left atrial size was not assessed.  5. Right atrial size was not assessed.  6. The pericardium was not assessed.  7. The mitral valve was not assessed. not assessed mitral valve regurgitation.  8. The tricuspid valve is not assessed. Tricuspid valve regurgitation not assessed.  9. Aortic valve regurgitation Not  assessed. 10. The aortic valve was not assessed. Aortic valve regurgitation Not assessed. 11. The pulmonic valve was not assessed. Pulmonic valve regurgitation Not assessed. 12. Not assessed. 13. The interatrial septum was not assessed.  ECHO 11/20 12/2018 LEFT VENTRICLE PLAX 2D LVIDd: 4.17 cm LVIDs: 2.50 cm LV PW: 1.26 cm LV IVS: 1.30 cm LVOT diam: 1.80 cm LV SV: 55 ml LV SV Index: 26.59 LVOT Area:  2.54 cm  06/2018 +------------------+------------++  AORTIC VALVE      +------------------+------------++  AV Area (Vmax):  0.88 cm    +------------------+------------++  AV Area (Vmean):  0.89 cm    +------------------+------------++  AV Area (VTI):  0.90 cm    +------------------+------------++  AV Vmax:  372.81 cm/s    +------------------+------------++  AV Vmean:  255.916 cm/s   +------------------+------------++  AV VTI:  0.923 m    +------------------+------------++  AV Peak Grad:  55.6 mmHg    +------------------+------------++  AV Mean Grad:  30.9 mmHg    +------------------+------------++  LVOT Vmax:  86.15 cm/s    +------------------+------------++  LVOT Vmean:  60.100 cm/s    +------------------+------------++  LVOT VTI:  0.220 m    +------------------+------------++  LVOT/AV VTI ratio: 0.24    +------------------+------------++  AR PHT:  779 msec    +------------------+------------++    Patient Profile     Ganesh Deeg is a 83yo male with PMH HTN, moderate to severe AS, myasthenia gravis, HLD, recent e. Coli bacteremia treated at [facility] (10/19-10/29) presenting with sepsis secondary to acute cholecystitis with acute HF and severe aortic stenosis.   Assessment & Plan    1. Shock - combined sepsis/cardiogenic  - PCT very high (67 11/23) - Initial sepsis felt to be cholecystitis  but now felt less likely - Meropenem switched to Unasyn on 11/26. (vanc stopped). Switched to zosyn due to salt load. Developed rash. (likely PCN related) Complete cefipime. Cx negative.  - WBC rising again today.  Off NE over night  2. Acute Systolic HF -> cardiogenic shock - Echo newly down to 20-25% with severe AS from 60-65% with mod-severe AS on 06/2018. - hs Trop > 27K  - suspect combination of ischemic and septic CM - cath deferred due to stroke and AKI.  - Seems to be moving toward comfort care.  - Will continue medical management for now. Give one dose of IV lasix today   3. Acute hypoxic respiratory failure - due to HF and sepsis - extubated 11/26 > retintubated 11/30 2/2 acute CVA with severe dysarthria.  - CCM managing  4. Severe Aortic Stenosis - Worsened AS since May of this year.  - ideally would plan TAVR but likely no longer a candidate  5. Acute cerebellar/temporal stroke - reintubated for airway protection - Stat CT showed acute subacute cerebellar infarcts L>R and possible basilar artery thrombus - stat MRI/MRA showed infarct to right temporal lobe, high grade stenosis non-dominant left vertebral artery but unclear if thrombus present in basilar artery  - taken for angiogram but not given tpa - Neurology following.   6. AKI - due to shock/ATN - baseline creatinine 1.6, up to 2.7 today  7. Myasthenia gravis  - weaned off stress-dose steroids. Now back on home dose prednisone '10mg'$  daily  - watch for precipitation of MG crisis    8. PAF - had ? AF overnight on 11/23 - now in NSR with frequent PVCs. Afib has not recurred  - Heparin stopped, on LMWH for DVT prophylaxis.  - Would not start heparin at this time due to risk for hemorrhagic transformation. If aggressive care pursued. Wil start apixaban  9. New LBBB - suspect ischemic  10. Hypernatremia -  Can give free water boluses  11. Acute delirium  - yesterday, 11/29 likely steroids/metabolic +/-  sundowning. Now reintubated.  - CCM managing  He continues to deteriorate with multiple medical problems. I think there is very little chance that he can have a  meaningful recovery. Agree that best plan may be to move to comfort care.   CRITICAL CARE Performed by: Glori Bickers  Total critical care time: 35 minutes  Critical care time was exclusive of separately billable procedures and treating other patients.  Critical care was necessary to treat or prevent imminent or life-threatening deterioration.  Critical care was time spent personally by me (independent of midlevel providers or residents) on the following activities: development of treatment plan with patient and/or surrogate as well as nursing, discussions with consultants, evaluation of patient's response to treatment, examination of patient, obtaining history from patient or surrogate, ordering and performing treatments and interventions, ordering and review of laboratory studies, ordering and review of radiographic studies, pulse oximetry and re-evaluation of patient's condition.    Signed, Glori Bickers, MD  01/26/2019, 9:42 AM

## 2019-01-26 DEATH — deceased

## 2019-01-27 ENCOUNTER — Ambulatory Visit: Payer: Self-pay | Admitting: Family Medicine

## 2019-01-27 ENCOUNTER — Encounter (HOSPITAL_COMMUNITY): Payer: Self-pay | Admitting: Interventional Radiology

## 2019-01-27 ENCOUNTER — Inpatient Hospital Stay (HOSPITAL_COMMUNITY): Payer: Medicare HMO

## 2019-01-27 LAB — CBC
HCT: 30.1 % — ABNORMAL LOW (ref 39.0–52.0)
Hemoglobin: 9.3 g/dL — ABNORMAL LOW (ref 13.0–17.0)
MCH: 29 pg (ref 26.0–34.0)
MCHC: 30.9 g/dL (ref 30.0–36.0)
MCV: 93.8 fL (ref 80.0–100.0)
Platelets: 241 10*3/uL (ref 150–400)
RBC: 3.21 MIL/uL — ABNORMAL LOW (ref 4.22–5.81)
RDW: 14.9 % (ref 11.5–15.5)
WBC: 23.5 10*3/uL — ABNORMAL HIGH (ref 4.0–10.5)
nRBC: 0 % (ref 0.0–0.2)

## 2019-01-27 LAB — GLUCOSE, CAPILLARY
Glucose-Capillary: 115 mg/dL — ABNORMAL HIGH (ref 70–99)
Glucose-Capillary: 115 mg/dL — ABNORMAL HIGH (ref 70–99)
Glucose-Capillary: 119 mg/dL — ABNORMAL HIGH (ref 70–99)
Glucose-Capillary: 123 mg/dL — ABNORMAL HIGH (ref 70–99)
Glucose-Capillary: 127 mg/dL — ABNORMAL HIGH (ref 70–99)
Glucose-Capillary: 130 mg/dL — ABNORMAL HIGH (ref 70–99)
Glucose-Capillary: 135 mg/dL — ABNORMAL HIGH (ref 70–99)

## 2019-01-27 LAB — COOXEMETRY PANEL
Carboxyhemoglobin: 1 % (ref 0.5–1.5)
Methemoglobin: 1.3 % (ref 0.0–1.5)
O2 Saturation: 68.4 %
Total hemoglobin: 10.6 g/dL — ABNORMAL LOW (ref 12.0–16.0)

## 2019-01-27 LAB — COMPREHENSIVE METABOLIC PANEL
ALT: 23 U/L (ref 0–44)
AST: 42 U/L — ABNORMAL HIGH (ref 15–41)
Albumin: 1.8 g/dL — ABNORMAL LOW (ref 3.5–5.0)
Alkaline Phosphatase: 133 U/L — ABNORMAL HIGH (ref 38–126)
Anion gap: 14 (ref 5–15)
BUN: 83 mg/dL — ABNORMAL HIGH (ref 8–23)
CO2: 19 mmol/L — ABNORMAL LOW (ref 22–32)
Calcium: 7.8 mg/dL — ABNORMAL LOW (ref 8.9–10.3)
Chloride: 115 mmol/L — ABNORMAL HIGH (ref 98–111)
Creatinine, Ser: 2.94 mg/dL — ABNORMAL HIGH (ref 0.61–1.24)
GFR calc Af Amer: 22 mL/min — ABNORMAL LOW (ref >60)
GFR calc non Af Amer: 19 mL/min — ABNORMAL LOW (ref >60)
Glucose, Bld: 123 mg/dL — ABNORMAL HIGH (ref 70–99)
Potassium: 4 mmol/L (ref 3.5–5.1)
Sodium: 148 mmol/L — ABNORMAL HIGH (ref 135–145)
Total Bilirubin: 1.2 mg/dL (ref 0.3–1.2)
Total Protein: 4.6 g/dL — ABNORMAL LOW (ref 6.5–8.1)

## 2019-01-27 LAB — MAGNESIUM
Magnesium: 2.2 mg/dL (ref 1.7–2.4)
Magnesium: 2.3 mg/dL (ref 1.7–2.4)

## 2019-01-27 LAB — PHOSPHORUS
Phosphorus: 5.1 mg/dL — ABNORMAL HIGH (ref 2.5–4.6)
Phosphorus: 5.4 mg/dL — ABNORMAL HIGH (ref 2.5–4.6)

## 2019-01-27 MED ORDER — ORAL CARE MOUTH RINSE
15.0000 mL | OROMUCOSAL | Status: DC
  Administered 2019-01-27 – 2019-01-30 (×21): 15 mL via OROMUCOSAL

## 2019-01-27 MED ORDER — CHLORHEXIDINE GLUCONATE 0.12% ORAL RINSE (MEDLINE KIT)
15.0000 mL | Freq: Two times a day (BID) | OROMUCOSAL | Status: DC
  Administered 2019-01-28 – 2019-01-30 (×5): 15 mL via OROMUCOSAL

## 2019-01-27 MED ORDER — HEPARIN (PORCINE) 25000 UT/250ML-% IV SOLN
1000.0000 [IU]/h | INTRAVENOUS | Status: DC
  Administered 2019-01-27: 1000 [IU]/h via INTRAVENOUS
  Filled 2019-01-27: qty 250

## 2019-01-27 MED ORDER — CHLORHEXIDINE GLUCONATE 0.12 % MT SOLN
OROMUCOSAL | Status: AC
  Filled 2019-01-27: qty 15

## 2019-01-27 NOTE — Progress Notes (Signed)
ANTICOAGULATION CONSULT NOTE - Initial Consult  Pharmacy Consult for heparin Indication: stroke  Allergies  Allergen Reactions  . Penicillins Rash    Noted after change from Unasyn to Zosyn 01/22/19    Patient Measurements: Height: 5\' 8"  (172.7 cm) Weight: 179 lb 14.3 oz (81.6 kg) IBW/kg (Calculated) : 68.4  Vital Signs: Temp: 98.7 F (37.1 C) (12/02 1545) Temp Source: Oral (12/02 1545) BP: 95/62 (12/02 1550) Pulse Rate: 76 (12/02 1550)  Labs: Recent Labs    01/07/2019 0231  01/18/2019 1025 12/27/2018 1535 01/26/19 0500 01/27/19 0452  HGB  --    < > 10.0* 9.9*  --  9.3*  HCT  --    < > 30.9* 29.0*  --  30.1*  PLT  --   --  233  --   --  241  CREATININE 2.29*  --   --   --  2.59* 2.94*   < > = values in this interval not displayed.    Estimated Creatinine Clearance: 19.1 mL/min (A) (by C-G formula based on SCr of 2.94 mg/dL (H)).  Assessment: 83 yo m originally presented with sepsis, now concern for embolic CVA  CBC stable  Goal of Therapy:  Heparin level 0.3 - 0.5 units/ml Monitor platelets by anticoagulation protocol: Yes   Plan:  Dc sq hep Initial IV heparin 1000 units/hr Initial hep lvl 0100  Barth Kirks, PharmD, BCPS, BCCCP Clinical Pharmacist [phone]  Please check AMION for all [place] numbers  01/27/2019 5:53 PM

## 2019-01-27 NOTE — Progress Notes (Signed)
[facility] Progress Note Patient Name: Duane Burns DOB: 11/03/1935 MRN: [id number]   Date of Service  01/27/2019  HPI/Events of Note  Agitation - Request for bilateral soft wrist restraints.   eICU Interventions  Will order: 1. Bilateral soft wrist restraints X 4 hours.      Intervention Category Major Interventions: Delirium, psychosis, severe agitation - evaluation and management  [name] 01/27/2019, 4:13 AM

## 2019-01-27 NOTE — Progress Notes (Addendum)
Patient ID: Duane Burns, male   DOB: 16-May-1935, 83 y.o.   MRN: [id number]  This NP visited patient at the bedside as a follow up for palliative medicine needs and emotional support.  Patient remains intubated.  He is more alert this morning but easily agitated, he remains with mitts to prevent self extubation.  Follow some simple commands Met with family at bedside for scheduled family meeting, family members present  are Neomia Glass, Rogelia Rohrer, Letitia Libra and the patient's wife.   Detailed discussion was had today regarding the patient's current medical situation.  We discussed his serious life limiting underlying conditions now compounded by recent stroke.   We discussed the difference between an aggressive medical intervention path and a comfort path for this patient at this time in this situation.  We discussed concepts specific to human mortality and the limitations of medical interventions to prolong quality of life when the body begins to fail to thrive.  Dr. Elsworth Soho participated in conversation explaining to family his current pulmonary situation as it relates to ventilator support.  He explained in detail spontaneous breathing trials.  Today patient failed breathing trial, within 20 minutes patient became tachypneic and agitated.  No extubation today   Dr. Elsworth Soho remains hopeful for a successful extubation in the next 24 to 48 hours.   Conversation had regarding the importance of consideration of reintubation or not once patient is weaned from the ventilator.    Patient's wife/the main decision maker and his 2 daughters agree to a one-way extubation once he is medically optimized and passes a spontaneous breathing trial.  At that time he would be a full DNR/DNI.    Both the patient's wife and his 2 daughters verbalize their understanding that their father would not want "to be kept alive on machines" They speak to a man who values independence and dignity.   We discussed the  importance and consideration of artificial feeding if at some point in the near future the patient cannot support himself nutritionally.  Both the patient's wife and his 2 daughters agree that the patient would not want a feeding tube.  Questions and concerns addressed.  Family remain hopeful for improvement.  Of note patient's wife and 2 daughters were all in agreement with above decisions "knowing this is what he would want".  However the entire meeting was difficult and contentious due to the fact that the patient's son was visibly and vocally agitated and in disagreement with his other family members decisions.  However by  the end of the meeting he conceded to what the patient's wife and majority of family members had decided.  Family was permitted to visit for another 10 minutes.   This family will need continued support and guidance as they work through these difficult healthcare decisions.     This nurse practitioner informed  the family that I will be out of the hospital until Monday morning.  If the patient is still hospitalized I will follow-up at that time.  Dr. Rhea Pink will follow this patient for any palliative needs please feel free to call  Call palliative medicine team phone # [phone] with questions or concerns.    Total time spent on the unit was 60 minutes    Greater than 50% of the time was spent in counseling and coordination of care  Wadie Lessen NP  Palliative Medicine Team Team Phone # [phone] Pager [phone]

## 2019-01-27 NOTE — Progress Notes (Addendum)
Progress Note  Patient Name: Duane Burns Date of Encounter: 01/27/2019  Primary Cardiologist: Kate Sable, MD   Subjective   Remains intubated. Awake on vent. Making purposeful movements but remains agitated On restraints. Discussed w/ RN at bedside. No significant events overnight.   Off pressors. NE discontinued. MAPs in the 80s. Co-ox 68%  CVP 7-9  Creatinine continues to rise. Now 2.94. BUN 83. Acidotic CO2 19.  Poor UOP. Only 535 cc out yesterday.    Inpatient Medications    Scheduled Meds: . aspirin  81 mg Per Tube Daily  . atorvastatin  80 mg Per Tube q1800  . chlorhexidine gluconate (MEDLINE KIT)  15 mL Mouth Rinse BID  . Chlorhexidine Gluconate Cloth  6 each Topical Daily  . cholecalciferol  1,000 Units Per Tube Daily  . clopidogrel  75 mg Per Tube Daily  . sennosides  5 mL Per Tube BID   And  . docusate  50 mg Per Tube BID  . feeding supplement (PRO-STAT SUGAR FREE 64)  30 mL Per Tube BID  . Gerhardt's butt cream   Topical BID  . heparin injection (subcutaneous)  5,000 Units Subcutaneous Q8H  . predniSONE  10 mg Per Tube Q breakfast  . pyridostigmine  60 mg Per Tube TID  . sodium chloride flush  10-40 mL Intracatheter Q12H  . sodium chloride flush  3 mL Intravenous Q12H   Continuous Infusions: . sodium chloride 250 mL (01/26/19 1718)  . dexmedetomidine (PRECEDEX) IV infusion Stopped (01/26/19 1125)  . famotidine (PEPCID) IV 20 mg (01/26/19 2216)  . feeding supplement (VITAL AF 1.2 CAL) 50 mL/hr at 01/26/19 1400  . fentaNYL infusion INTRAVENOUS 75 mcg/hr (01/27/19 0600)  . norepinephrine (LEVOPHED) Adult infusion Stopped (01/27/19 0332)   PRN Meds: sodium chloride, acetaminophen **OR** acetaminophen, bisacodyl, diphenhydrAMINE, fentaNYL (SUBLIMAZE) injection, guaiFENesin, haloperidol lactate, levalbuterol, midazolam, ondansetron **OR** ondansetron (ZOFRAN) IV, polyethylene glycol, sennosides, sodium chloride flush, sodium chloride flush, traZODone   Vital Signs    Vitals:   01/27/19 0400 01/27/19 0500 01/27/19 0600 01/27/19 0733  BP: 106/63 108/68 112/66   Pulse: 92 89 96   Resp:  (!) 22 18   Temp:    99.4 F (37.4 C)  TempSrc:    Axillary  SpO2: 96% 99% 98%   Weight:      Height:        Intake/Output Summary (Last 24 hours) at 01/27/2019 0736 Last data filed at 01/27/2019 0600 Gross per 24 hour  Intake 1219.65 ml  Output 535 ml  Net 684.65 ml   Filed Weights   01/01/2019 0205 01/26/19 0650 01/27/19 0344  Weight: 78 kg 80.3 kg 81.6 kg    Telemetry    NSR 90s   Physical Exam   PHYSICAL EXAM: General:  Critically ill appearing WM, intubated but alert. Mildly agitated.  HEENT: normal Neck: supple. Elevated JVD. Carotids 2+ bilat; no bruits. No lymphadenopathy or thyromegaly appreciated. Cor: PMI nondisplaced. Regular rate & rhythm. 2/6 murmur at LLSB Lungs: intubated. Course BS Abdomen: soft, nontender, nondistended. No hepatosplenomegaly. No bruits or masses. Good bowel sounds. Extremities: no cyanosis, clubbing, rash, edema, bilateral SCDs Neuro: intubated but alert. Making purposeful movement. Agitated.   Labs    Chemistry Recent Labs  Lab 01/18/2019 0231  01/21/2019 1535 01/26/19 0500 01/27/19 0452  NA 146*   < > 144 149* 148*  K 3.4*   < > 4.4 4.2 4.0  CL 113*  --   --  115* 115*  CO2  22  --   --  16* 19*  GLUCOSE 94  --   --  107* 123*  BUN 61*  --   --  63* 83*  CREATININE 2.29*  --   --  2.59* 2.94*  CALCIUM 7.9*  --   --  8.2* 7.8*  PROT  --   --   --   --  4.6*  ALBUMIN  --   --   --   --  1.8*  AST  --   --   --   --  42*  ALT  --   --   --   --  23  ALKPHOS  --   --   --   --  133*  BILITOT  --   --   --   --  1.2  GFRNONAA 26*  --   --  22* 19*  GFRAA 30*  --   --  26* 22*  ANIONGAP 11  --   --  18* 14   < > = values in this interval not displayed.     Hematology Recent Labs  Lab 01/24/19 0330  01/14/2019 1025 12/28/2018 1535 01/27/19 0452  WBC 30.8*  --  35.1*  --  23.5*  RBC 3.51*   --  3.41*  --  3.21*  HGB 10.3*   < > 10.0* 9.9* 9.3*  HCT 31.6*   < > 30.9* 29.0* 30.1*  MCV 90.0  --  90.6  --  93.8  MCH 29.3  --  29.3  --  29.0  MCHC 32.6  --  32.4  --  30.9  RDW 14.6  --  14.6  --  14.9  PLT 167  --  233  --  241   < > = values in this interval not displayed.    Cardiac EnzymesNo results for input(s): TROPONINI in the last 168 hours. No results for input(s): TROPIPOC in the last 168 hours.   BNP No results for input(s): BNP, PROBNP in the last 168 hours.   DDimer No results for input(s): DDIMER in the last 168 hours.   Radiology    Dg Abd 1 View  Result Date: 01/10/2019 CLINICAL DATA:  OG tube placement. EXAM: ABDOMEN - 1 VIEW COMPARISON:  Chest x-ray 01/24/2019. FINDINGS: A G-tube noted with tip coiled in the stomach. No gastric or bowel distention. No free air. Right IJ line noted with tip over cavoatrial junction. IMPRESSION: OG tube noted with tip coiled in the stomach. Electronically Signed   By: Marcello Moores  Register   On: 01/06/2019 11:34   Vas [place] Lower Extremity Venous (dvt)  Result Date: 01/26/2019  Lower Venous Study Indications: Stroke.  Limitations: Bandages. Comparison Study: no prior Performing Technologist: Abram Sander RVS  Examination Guidelines: A complete evaluation includes B-mode imaging, spectral Doppler, color Doppler, and power Doppler as needed of all accessible portions of each vessel. Bilateral testing is considered an integral part of a complete examination. Limited examinations for reoccurring indications may be performed as noted.  +---------+---------------+---------+-----------+----------+--------------+ RIGHT    CompressibilityPhasicitySpontaneityPropertiesThrombus Aging +---------+---------------+---------+-----------+----------+--------------+ CFV                                                   Not visualized +---------+---------------+---------+-----------+----------+--------------+ SFJ  Not visualized +---------+---------------+---------+-----------+----------+--------------+ FV Prox                                               Not visualized +---------+---------------+---------+-----------+----------+--------------+ FV Mid   Full                                                        +---------+---------------+---------+-----------+----------+--------------+ FV DistalFull                                                        +---------+---------------+---------+-----------+----------+--------------+ PFV                                                   Not visualized +---------+---------------+---------+-----------+----------+--------------+ POP      Full           Yes      Yes                                 +---------+---------------+---------+-----------+----------+--------------+ PTV      Full                                                        +---------+---------------+---------+-----------+----------+--------------+ PERO     Full                                                        +---------+---------------+---------+-----------+----------+--------------+   +---------+---------------+---------+-----------+----------+--------------+ LEFT     CompressibilityPhasicitySpontaneityPropertiesThrombus Aging +---------+---------------+---------+-----------+----------+--------------+ CFV      Full           Yes      Yes                                 +---------+---------------+---------+-----------+----------+--------------+ SFJ      Full                                                        +---------+---------------+---------+-----------+----------+--------------+ FV Prox  Full                                                        +---------+---------------+---------+-----------+----------+--------------+  FV Mid   Full                                                         +---------+---------------+---------+-----------+----------+--------------+ FV DistalFull                                                        +---------+---------------+---------+-----------+----------+--------------+ PFV      Full                                                        +---------+---------------+---------+-----------+----------+--------------+ POP      Full           Yes      Yes                                 +---------+---------------+---------+-----------+----------+--------------+ PTV      Full                                                        +---------+---------------+---------+-----------+----------+--------------+ PERO                                                  Not visualized +---------+---------------+---------+-----------+----------+--------------+     Summary: Right: There is no evidence of deep vein thrombosis in the lower extremity. However, portions of this examination were limited- see technologist comments above. No cystic structure found in the popliteal fossa. Left: There is no evidence of deep vein thrombosis in the lower extremity. No cystic structure found in the popliteal fossa.  *See table(s) above for measurements and observations. Electronically signed by Deitra Mayo MD on 01/26/2019 at 12:19:37 PM.    Final     Cardiac Studies   Echo 11/21  1. Left ventricular ejection fraction, by visual estimation, is 20 to 25%. The left ventricle has severely decreased function. There is no left ventricular hypertrophy.  2. Diffuse hypokinesis with most prominent severe hypokinesis of the anteroseptal wall and the entire apex.  3. Global right ventricle was not assessed.The right ventricular size is not assessed. Right vetricular wall thickness was not assessed.  4. Left atrial size was not assessed.  5. Right atrial size was not assessed.  6. The pericardium was not assessed.  7. The mitral valve was not assessed. not  assessed mitral valve regurgitation.  8. The tricuspid valve is not assessed. Tricuspid valve regurgitation not assessed.  9. Aortic valve regurgitation Not assessed. 10. The aortic valve was not assessed. Aortic valve regurgitation Not assessed. 11. The pulmonic valve was not assessed. Pulmonic valve regurgitation Not assessed. 12. Not assessed. 13. The interatrial septum was  not assessed.  ECHO 11/20 12/2018 LEFT VENTRICLE PLAX 2D LVIDd: 4.17 cm LVIDs: 2.50 cm LV PW: 1.26 cm LV IVS: 1.30 cm LVOT diam: 1.80 cm LV SV: 55 ml LV SV Index: 26.59 LVOT Area: 2.54 cm  06/2018 +------------------+------------++ AORTIC VALVE   +------------------+------------++ AV Area (Vmax): 0.88 cm  +------------------+------------++ AV Area (Vmean): 0.89 cm  +------------------+------------++ AV Area (VTI): 0.90 cm  +------------------+------------++ AV Vmax: 372.81 cm/s  +------------------+------------++ AV Vmean: 255.916 cm/s +------------------+------------++ AV VTI: 0.923 m  +------------------+------------++ AV Peak Grad: 55.6 mmHg  +------------------+------------++ AV Mean Grad: 30.9 mmHg  +------------------+------------++ LVOT Vmax: 86.15 cm/s  +------------------+------------++ LVOT Vmean: 60.100 cm/s  +------------------+------------++ LVOT VTI: 0.220 m  +------------------+------------++ LVOT/AV VTI ratio:0.24  +------------------+------------++ AR PHT: 779 msec  +------------------+------------++    Patient Profile     Covey Baller is a 83yo male with PMH HTN, moderate to severe AS, myasthenia gravis, HLD, recent e. Coli bacteremia treated at [facility] (10/19-10/29) presenting with sepsis secondary to acute cholecystitis with acute HF and  severe aortic stenosis.   Assessment & Plan    1. Shock - combined sepsis/cardiogenic  - PCT very high (67 11/23) - Initial sepsis felt to be cholecystitis but now felt less likely - Meropenem switched to Unasyn on 11/26. (vanc stopped). Switched to zosyn due to salt load. Developed rash. (likely PCN related) Completed cefipime. Cx negative.  - WBC trending down, 35>>23.  Off NE. MAPs in the 80s   2. Acute Systolic HF -> cardiogenic shock - Echo newly down to 20-25% with severe AS from 60-65% with mod-severe AS on 06/2018. - hs Trop > 27K  - suspect combination of ischemic and septic CM - cath deferred due to stroke and AKI.  - Seems to be moving toward comfort care. Family meeting today at noon to discuss [place] - Will continue medical management for now. Give one dose of IV lasix today   3. Acute hypoxic respiratory failure - due to HF and sepsis - extubated 11/26 > retintubated 11/30 2/2 acute CVA with severe dysarthria.  - CCM managing  4. Severe Aortic Stenosis - Worsened AS since May of this year.  - ideally would plan TAVR but likely no longer a candidate  5. Acute cerebellar/temporal stroke - reintubated for airway protection - Stat CT showed acute subacute cerebellar infarcts L>R and possible basilar artery thrombus - stat MRI/MRA showed infarct to right temporal lobe, high grade stenosis non-dominant left vertebral artery but unclear if thrombus present in basilar artery  - taken for angiogram but not given tpa - Neurology following.   6. AKI - due to shock/ATN - baseline creatinine 1.6. continues to rise, now 2.94  7. Myasthenia gravis  - weaned off stress-dose steroids. Now back on home dose prednisone 65m daily  - watch for precipitation of MG crisis    8. PAF - had ? AF overnight on 11/23 - now in NSR with frequent PVCs. Afib has not recurred  - Heparin stopped, on LMWH for DVT prophylaxis.  - Would not start heparin at this time due to risk for hemorrhagic  transformation. If aggressive care pursued. Wil start apixaban  9. New LBBB - suspect ischemic  10. Hypernatremia -  Can give free water  - Na 148 today  11. Acute delirium  - yesterday, 11/29 likely steroids/metabolic +/- sundowning. Now reintubated.  - CCM managing  Family meeting scheduled today w/ palliative care to discuss [place] Signed, BLyda Jester PA-C  01/27/2019, 7:36 AM    Agree.  He remains intubated. Awake on vent but remains agitated. Given IV lasix yesterday but weight still climbing and Creatinine worsening (? Hypotension or contrast from cerebral angio).   Exam Awake on vent agitated appears to have purposeful movements HEENT: normal + ETT Neck: supple. RIJ TLC Carotids 2+ bilat; no bruits. No lymphadenopathy or thryomegaly appreciated. Cor: PMI nondisplaced. Regular rate & rhythm. 2/6 AS Lungs: coarse Abdomen: soft, nontender, nondistended. No hepatosplenomegaly. No bruits or masses. Good bowel sounds. Extremities: no cyanosis, clubbing, rash, 1+ edema Neuro: awake on vent agitated  He remains critically ill with MSOF. Awake on vent but agitated. Will attempt to diurese today with IV lasix.   Continue medical therapy for cardiac issues. Currently not candidate for TAVR or coronary angio.   Await results of family meeting today.   CRITICAL CARE Performed by: Glori Bickers  Total critical care time: 35 minutes  Critical care time was exclusive of separately billable procedures and treating other patients.  Critical care was necessary to treat or prevent imminent or life-threatening deterioration.  Critical care was time spent personally by me (independent of midlevel providers or residents) on the following activities: development of treatment plan with patient and/or surrogate as well as nursing, discussions with consultants, evaluation of patient's response to treatment, examination of patient, obtaining history from patient or surrogate,  ordering and performing treatments and interventions, ordering and review of laboratory studies, ordering and review of radiographic studies, pulse oximetry and re-evaluation of patient's condition.  Glori Bickers, MD  8:07 AM

## 2019-01-27 NOTE — Progress Notes (Signed)
Pt was breathing in the high 40s while on wean. Pt was placed back on full support and is stable at this time. Rt will monitor.

## 2019-01-27 NOTE — Progress Notes (Signed)
STROKE TEAM PROGRESS NOTE   INTERVAL HISTORY RN at bedside. Pt still intubated but able to follow simple commands and more awake alert interactive than yesterday. BUE and BLE moving symmetrically. May be able to extubate today. Dr. Elsworth Soho is speaking along with palliative care to family this time.    Vitals:   01/27/19 0600 01/27/19 0733 01/27/19 0800 01/27/19 0804  BP: 112/66 106/60    Pulse: 96 91 97 96  Resp: 18 20 18  (!) 21  Temp:  99.4 F (37.4 C)    TempSrc:  Axillary    SpO2: 98%  98% 100%  Weight:      Height:        CBC:  Recent Labs  Lab 01/23/19 0412  01/08/2019 1025 01/04/2019 1535 01/27/19 0452  WBC 29.5*   < > 35.1*  --  23.5*  NEUTROABS 26.8*  --   --   --   --   HGB 11.5*   < > 10.0* 9.9* 9.3*  HCT 35.4*   < > 30.9* 29.0* 30.1*  MCV 90.3   < > 90.6  --  93.8  PLT 150   < > 233  --  241   < > = values in this interval not displayed.    Basic Metabolic Panel:  Recent Labs  Lab 01/26/19 0500  01/26/19 1716 01/27/19 0452  NA 149*  --   --  148*  K 4.2  --   --  4.0  CL 115*  --   --  115*  CO2 16*  --   --  19*  GLUCOSE 107*  --   --  123*  BUN 63*  --   --  83*  CREATININE 2.59*  --   --  2.94*  CALCIUM 8.2*  --   --  7.8*  MG  --    < > 2.2 2.2  PHOS  --    < > 5.1* 5.1*   < > = values in this interval not displayed.   Lipid Panel:     Component Value Date/Time   CHOL 88 01/26/2019 0500   CHOL 171 01/27/2018 0911   TRIG 101 01/26/2019 0500   HDL 18 (L) 01/26/2019 0500   HDL 61 01/27/2018 0911   CHOLHDL 4.9 01/26/2019 0500   VLDL 20 01/26/2019 0500   LDLCALC 50 01/26/2019 0500   LDLCALC 96 01/27/2018 0911   HgbA1c:  Lab Results  Component Value Date   HGBA1C 6.2 (H) 01/24/2019   Urine Drug Screen:     Component Value Date/Time   LABOPIA NONE DETECTED 11/10/2014 1740   COCAINSCRNUR NONE DETECTED 11/10/2014 1740   LABBENZ NONE DETECTED 11/10/2014 1740   AMPHETMU NONE DETECTED 11/10/2014 1740   THCU NONE DETECTED 11/10/2014 1740    LABBARB NONE DETECTED 11/10/2014 1740    Alcohol Level     Component Value Date/Time   ETH <5 11/10/2014 1621    IMAGING Dg Abd 1 View  Result Date: 01/09/2019 CLINICAL DATA:  OG tube placement. EXAM: ABDOMEN - 1 VIEW COMPARISON:  Chest x-ray 01/24/2019. FINDINGS: A G-tube noted with tip coiled in the stomach. No gastric or bowel distention. No free air. Right IJ line noted with tip over cavoatrial junction. IMPRESSION: OG tube noted with tip coiled in the stomach. Electronically Signed   By: Marcello Moores  Register   On: 01/13/2019 11:34   Dg Chest Port 1 View  Result Date: 01/27/2019 CLINICAL DATA:  Respiratory failure.  Intubated. EXAM: PORTABLE  CHEST 1 VIEW COMPARISON:  01/24/2019 FINDINGS: The endotracheal tube is 4.5 cm above the carina. The NG tube is coursing down the esophagus and into the stomach. Stable position of the right IJ central venous catheter. The heart is mildly enlarged but stable. Stable tortuosity, ectasia and calcification of the thoracic aorta. Low lung volumes with vascular crowding and bibasilar atelectasis. Mild central vascular congestion but no overt pulmonary edema or definite pleural effusions. The bony thorax is intact. IMPRESSION: ET and NG tubes in good position. Low lung volumes with vascular crowding and bibasilar atelectasis. Electronically Signed   By: Marijo Sanes M.D.   On: 01/27/2019 08:17   Vas [place] Lower Extremity Venous (dvt)  Result Date: 01/26/2019  Lower Venous Study Indications: Stroke.  Limitations: Bandages. Comparison Study: no prior Performing Technologist: Abram Sander RVS  Examination Guidelines: A complete evaluation includes B-mode imaging, spectral Doppler, color Doppler, and power Doppler as needed of all accessible portions of each vessel. Bilateral testing is considered an integral part of a complete examination. Limited examinations for reoccurring indications may be performed as noted.   +---------+---------------+---------+-----------+----------+--------------+ RIGHT    CompressibilityPhasicitySpontaneityPropertiesThrombus Aging +---------+---------------+---------+-----------+----------+--------------+ CFV                                                   Not visualized +---------+---------------+---------+-----------+----------+--------------+ SFJ                                                   Not visualized +---------+---------------+---------+-----------+----------+--------------+ FV Prox                                               Not visualized +---------+---------------+---------+-----------+----------+--------------+ FV Mid   Full                                                        +---------+---------------+---------+-----------+----------+--------------+ FV DistalFull                                                        +---------+---------------+---------+-----------+----------+--------------+ PFV                                                   Not visualized +---------+---------------+---------+-----------+----------+--------------+ POP      Full           Yes      Yes                                 +---------+---------------+---------+-----------+----------+--------------+ PTV      Full                                                        +---------+---------------+---------+-----------+----------+--------------+  PERO     Full                                                        +---------+---------------+---------+-----------+----------+--------------+   +---------+---------------+---------+-----------+----------+--------------+ LEFT     CompressibilityPhasicitySpontaneityPropertiesThrombus Aging +---------+---------------+---------+-----------+----------+--------------+ CFV      Full           Yes      Yes                                  +---------+---------------+---------+-----------+----------+--------------+ SFJ      Full                                                        +---------+---------------+---------+-----------+----------+--------------+ FV Prox  Full                                                        +---------+---------------+---------+-----------+----------+--------------+ FV Mid   Full                                                        +---------+---------------+---------+-----------+----------+--------------+ FV DistalFull                                                        +---------+---------------+---------+-----------+----------+--------------+ PFV      Full                                                        +---------+---------------+---------+-----------+----------+--------------+ POP      Full           Yes      Yes                                 +---------+---------------+---------+-----------+----------+--------------+ PTV      Full                                                        +---------+---------------+---------+-----------+----------+--------------+ PERO  Not visualized +---------+---------------+---------+-----------+----------+--------------+     Summary: Right: There is no evidence of deep vein thrombosis in the lower extremity. However, portions of this examination were limited- see technologist comments above. No cystic structure found in the popliteal fossa. Left: There is no evidence of deep vein thrombosis in the lower extremity. No cystic structure found in the popliteal fossa.  *See table(s) above for measurements and observations. Electronically signed by Deitra Mayo MD on 01/26/2019 at 12:19:37 PM.    Final    Cerebral Angio 01/09/2019 1.Occluded Lt VA at origin with no distal reconstitution. 2.Widely patent basilar artery.  EEG Abnormality - Continuous slow,  generalized IMPRESSION: This study is suggestive of severe diffuse encephalopathy, nonspecific to etiology. No seizures or epileptiform discharges were seen throughout the recording.   PHYSICAL EXAM   Temp:  [97.6 F (36.4 C)-99.4 F (37.4 C)] 99.4 F (37.4 C) (12/02 0733) Pulse Rate:  [62-121] 96 (12/02 0804) Resp:  [13-34] 21 (12/02 0804) BP: (97-126)/(59-92) 106/60 (12/02 0733) SpO2:  [95 %-100 %] 100 % (12/02 0804) Arterial Line BP: (101-289)/(47-266) 289/266 (12/02 0339) FiO2 (%):  [30 %] 30 % (12/02 0804) Weight:  [81.6 kg] 81.6 kg (12/02 0344)  General - Well nourished, well developed, intubated on low-dose fentanyl.  Ophthalmologic - fundi not visualized due to noncooperation.  Cardiovascular - Regular rate and rhythm.  Neuro - intubated on low-dose fentanyl, eyes open, able to follow simple peripheral and central commands. Able to follow commands both hands and both feet, but much quicker response at UEs. Still has intermittent bilateral upper extremity flailing but denies pain, PERRL, corneal reflexes present bilaterally, gag and cough present. Breathing over the vent.  Facial symmetry not able to test due to ET tube.  Tongue protrusion midline. BUE on restrain but against gravity and flailing spontaneously. BLE mild withdraw on pain stimulation, but able to wiggle toes on repetitive requests. Sensation, coordination and gait not tested.   ASSESSMENT/PLAN Duane Burns is a 83 y.o. male with history of hypertension, hyperlipidemia, severe aortic stenosis, myasthenia gravis versus Guillain-Barr presenting to [facility] 11/20 and  transferred 11/20 to [facility] for sepsis, acute renal failure and acute respiratory failure in the setting of pulmonary edema and possible pneumonia. Found to have acute on chronic systolic heart failure w/ EF 20%. Extubated 11/26 until 11/30 0430 when he became suddenly unresponsive.  CT head showed L & R cerebellar  infarcts, concern for hyperdense basilar artery.  MRI confirmed cerebell ar stroke but MRA did not confirm basilar thrombus. Intubated. Sent to IR for diagnostic cerebral angiogram.   Acute encephalopathy  11/30 Unresponsiveness, reported disconjugate eyes, clenching jaws and flaccid extremities  Received ativan with some improvement  Etiology not clear, could be seizure with post ictal. EEG no Sz but encephalopathy  Several factors for lowered seizure threshold, including AKI, severe leukocytosis and encephalopathy  Posterior stroke is in DDx but MRA and angiogram showed patent BA, the stroke on CT and MRI was subacute instead of acute, felt not able to explain pt presentation  Continue neuro check and seizure precaution  Continue to correct metabolic derangement   Stroke:   L> R cerebellar and R temporal lobe infarcts embolic secondary to unknown source - low EF and possible AF 11/23  Code Stroke CT head L>R superior cerebellar infarcts. BA density ? Thrombus. ASPECTS 10.     MRI  L>R cerebellar infarcts. R periventricular white matter infarct medial R temporal lobe. Small vessel disease. Atrophy.  MRA L VA occlusion. B fetal PCAs. Flow present in BA.   Cerebral Angio L VA occlusion. Patent BA.   2D Echo EF 20-25%. Moderate to severe AS   EEG no sz. Severe diffuse encephalopathy  LE venous doppler no DVT  LDL 96  HgbA1c 6.2  Heparin 5000 units sq tid for VTE prophylaxis  aspirin 81 mg daily prior to admission, now on aspirin 81 mg daily and clopidogrel 75 mg daily.  Consider DOAC if aggressive care pursued given severe cardiomyopathy, PAF and stroke.   Therapy recommendations:  SNF  Disposition:  pending   Ongoing Palliative care discussion with wife and 3 kids today at 65 noon.   Possible PAF  ? AF overnight on 11/23  Now in NSR w/ PVCs. No recurrent AF  Treated with IV heparin, now off  On DAPT now  Consider DOAC if aggressive care  pursued  Cardiomyopathy  11/20 EF 30 to 35%  11/21 EF 20 to 25%  Treated for pulmonary edema  On DAPT  Consider DOAC if aggressive care pursued  Hypotension Hx Hypertension  Stable on the low end  Off Levophed . Avoid low BP . Long-term BP goal normotensive  AKI, worsening  Creatinine 2.07-2.3-2.29-2.59-2.94  On IV fluid  Management per primary team  acute hypoxic respiratory failure   in the setting of pulmonary edema and possible pneumonia.   Intubated->Extubated->Reintubated  Sedated with low-dose fentanyl  Septic shock  Tapered off Levophed and neo  Leukocytosis WBC 29.5-30.8-35.1-23.5    Afebrile  Off abx now   Hyperlipidemia  Home meds:  Fish oil and pravachol 80  Now on lipitor 80  LDL 96, goal < 70  Continue statin at discharge  Diabetes, type II, controlled  HgbA1c 6.2, goal < 7.0   SSI   CBG monitoring  Malnutrition / Dysphagia . Secondary to stroke . NPO  Other Stroke Risk Factors  Advanced age  Hx Cigarette smoker  CAD w/ ischemic cardiac event, not a candidate for acute w/u  severe aortic stenosis, no longer a TAVR candidate  acute on chronic systolic heart failure w/ EF 20% d/t ischemia and sepsis  Agitation and delirium  Other Active Problems  Hypokalemia  Hypernatremia   Urticaria ? R/t PCN  Cholecystitis w/ GB distension, resolved on own  Hx myasthenia gravis versus Guillain-Barr - on Mestinon and prednisone  anemia 10.0-9.9-9.3  Hospital day # 12  This patient is critically ill due to acute encephalopathy, stroke, possible seizure, cardiomyopathy, hypotension, sepsis and at significant risk of neurological worsening, death form recurrent stroke, status epilepticus, septic shock, cardiogenic shock, heart failure. This patient's care requires constant monitoring of vital signs, hemodynamics, respiratory and cardiac monitoring, review of multiple databases, neurological assessment, discussion with  family, other specialists and medical decision making of high complexity. I spent 35 minutes of neurocritical care time in the care of this patient. I have discussed with Dr. Elsworth Soho.   Rosalin Hawking, MD PhD Stroke Neurology 01/27/2019 9:10 AM    To contact Stroke Continuity provider, please refer to [email or web address] After hours, contact General Neurology

## 2019-01-27 NOTE — Progress Notes (Signed)
NAME:  Duane Burns, MRN:  [id number], DOB:  Jun 27, 1935, LOS: 12 ADMISSION DATE:  01/08/2019,  CHIEF COMPLAINT:  Chest pain  Brief History   83 yo male who initally presented to [facility] with findings consistent with sepsis. Recent e. Coli bacteremia treated at [facility] (10/19-10/29). While at [facility], patient developed chest pain with multiple PACs and PVCs and was subsequently transferred to [facility] for further evaluation and treatment. On arrival, trops>27k. Lactate 3.7. white count 37k. No ST elevation on initial EKG however did develop new LBBB on later EKG. Cardiology consulted and spoke with intervential cardiology. Given septic state, renal failure, was not candidate for cath lab.  Imaging suggestive of biliary source of sepsis with cholecytitis present. Not a candidate for lap chole at that time so IR consulted with plans for perc cholecystotomy tube place 11/23 however on repeat imaging, gallbladder distension was resolved so tube placement was deferred.  Past Medical History  Myasthenia Gravis HTN Prostate cancer HLD RAS   Significant Hospital Events   11/20 admitted to [facility] with sepsis 11/21 [facility] for chest pain and EKG changes  Consults:  Cardiology gen surg IR  Procedures:  11/20 CVC 11/22 ETT >> 11/23 central IJ line placement  Significant Diagnostic Tests:  11/20 echo: EF 30-35%. Severely decreased LV function. Mild LVH. 11/21 echo: EF 20-25% with severely decreased LV function. No LVH. Diffuse hypokinesis most prominently of the anteroseptal wall and entire apex  11/20 CT c/a/p:pericholecystic edema. Left renal stone  11/21 abd [place]: thickened gallbladder wall with gallstones/sludge. Suspicious for cholecystitis. 11/23 CT a/p: interval resolution of gallbladder distension. New bilateral pleural effusions and bibasilar consolidation  11/21 CXR: worsened CHF pattern 11/22 CXR: progressively worsened pulm edema. Slight  increase in cardiomegaly 11/23 CXR s/p central line placement: improving bilateral pulm infiltrates.  11/24 CXR: pulm vasc congestion improving. Mild bibasilar airspace disease atelectasis vs infection 11/26 chest x-ray: Minimal bilateral vascular congestion. 12/27/2018: Overnight acutely unresponsive, code stroke, new cerebellar strokes, basilar artery angiography by interventional neuro.  Micro Data:  11/20 BC>>NGTD 11/22 BC >> ng  Antimicrobials:  merrem 11/21>> 01/21/2019 vanc 11/21>> 01/21/2019 01/21/2019 Unasyn>>11/28 Cefepime  Interim history/subjective:   Remains critically ill, intubated Sedated on fentanyl drip with intermittent breakthrough agitation Urine output decreasing Afebrile Off Levophed  Objective   Blood pressure 95/62, pulse 76, temperature 98.7 F (37.1 C), temperature source Oral, resp. rate 19, height 5\' 8"  (1.727 m), weight 81.6 kg, SpO2 99 %. CVP:  [7 mmHg-12 mmHg] 12 mmHg  Vent Mode: PRVC FiO2 (%):  [30 %] 30 % Set Rate:  [16 bmp] 16 bmp Vt Set:  [410 mL] 410 mL PEEP:  [5 cmH20] 5 cmH20 Pressure Support:  [5 cmH20] 5 cmH20 Plateau Pressure:  [15 cmH20-17 cmH20] 17 cmH20   Intake/Output Summary (Last 24 hours) at 01/27/2019 1555 Last data filed at 01/27/2019 1400 Gross per 24 hour  Intake 1323.42 ml  Output 200 ml  Net 1123.42 ml   Filed Weights   01/01/2019 0205 01/26/19 0650 01/27/19 0344  Weight: 78 kg 80.3 kg 81.6 kg    Examination: General: Elderly male intubated on vent HEENT: NCAT, sclera no icterus, mild pallor Neuro: On fentanyl drip, RASS +1, moves all 4 extremities, follows commands , appears frustrated and agitated ,motions for tube to be taken out CV: Regular rate and rhythm, S1-S2 PULM: Bilateral ventilated breath sounds GI: Soft, nontender, nondistended GU: Foley in place Extremities: No edema Skin: Petechia  Chest x-ray 2/2 personally reviewed shows low lung volumes and bibasilar atelectasis, no effusions Labs show  increasing creatinine to 2.9, BUN rising, decreasing leukocytosis from 35-23, stable anemia   Resolved Hospital Problem list   Urticaria/rash 11/27  Assessment & Plan:   Acute respiratory failure acquiring intubation and mechanical ventilation Acute pulmonary edema, improved Plan:  He tolerated spontaneous breathing trial 5/5 with adequate tidal volumes for about 15 to 20 minutes but then developed tachypnea and was placed back on full support-not sure if this was related to agitation and overall frustration He does seem to be more awake and alert and deserves trial of extubation, see below for goals of care discussion  Septic shock, leukocytosis, unclear etiology, history of gram-negative bacteremia at previous hospitalization, shock state resolved Leukocytosis secondary to steroids, decreasing  Plan Observe off antibiotics   Hypotensive, likely medication effect postop, now off Levophed    Acute cerebellar stroke Status post basilar artery angiography, no stenting EEG with diffuse encephalopathy no seizure Plan: Improved and per neurology may not have residual deficits. We will start IV heparin   Ischemic cardiac event, newly depressed ejection fraction [id number] Acute  systolic heart failure, volume overload, resolving Trops >27k. New LBBB on EKG. BNP 4100 on admission.  11/21 echo with EF of 20-25% with severe hypokinesis of anteroseptum and apex. Severe Aortic Stenosis.   Cath deferred due to stroke and AKI Plan Hold Lasix  Thickened gallbladder on ultrasound examination but without significant fluid for drainage placement. P: Restart tube feeds  AKI. Lab Results  Component Value Date   CREATININE 2.94 (H) 01/27/2019   CREATININE 2.59 (H) 01/26/2019   CREATININE 2.29 (H) 01/22/2019   Plan: Avoid nephrotoxins Hold Lasix  Myasthenia Gravis.   Plan:  Resume 10 mg prednisone daily plus Mestinon   Hypernatremia Plan: Add free water   GOC: I participated in  goals of care discussion 12/2 with Wadie Lessen. NP from palliative care.. Wife 2 daughters and son present. I explained that he has improved today and has tolerated spontaneous breathing trial however due to his poor cardiac and renal situation with new strokes and deconditioning, his long-term prognosis is guarded although I am hopeful for a successful extubation. He clearly expressed that he would not want permanent tubes and long-term mechanical ventilation. Son had a contrarian opinion and made several passive/aggressive comments but deferred to his mom for final decision making. We finally agreed to a one way extubation once his medical status is optimized and when he passes spontaneous breathing trial. If we do not reach this point, then we will rediscuss goals of care.   Best practice:  Diet: 01/22/2019 advance diet as tolerated Pain/Anxiety/Delirium protocol (if indicated): n/a DVT prophylaxis: Heparin Mobility: BR Code Status: Full Family Communication: Entire family 12/2 Disposition:ICU   The patient is critically ill with multiple organ systems failure and requires high complexity decision making for assessment and support, frequent evaluation and titration of therapies, application of advanced monitoring technologies and extensive interpretation of multiple databases. Critical Care Time devoted to patient care services described in this note independent of APP/resident  time is 45 minutes.   Kara Mead MD. Shade Flood. [facility]  If no response to pager , please call [phone]     01/27/2019 3:55 PM

## 2019-01-27 NOTE — Progress Notes (Signed)
SLP Cancellation Note  Patient Details Name: Duane Burns MRN: [id number] DOB: [birth year]   Cancelled treatment:       Reason Eval/Treat Not Completed: Medical issues which prohibited therapy (pt remains on the vent)   Duane Burns 01/27/2019, 7:25 AM  Duane Burns, M.A. [place] Acute Environmental education officer [phone] Office [phone]

## 2019-01-28 LAB — CBC
HCT: 33 % — ABNORMAL LOW (ref 39.0–52.0)
Hemoglobin: 10.2 g/dL — ABNORMAL LOW (ref 13.0–17.0)
MCH: 28.7 pg (ref 26.0–34.0)
MCHC: 30.9 g/dL (ref 30.0–36.0)
MCV: 93 fL (ref 80.0–100.0)
Platelets: 253 10*3/uL (ref 150–400)
RBC: 3.55 MIL/uL — ABNORMAL LOW (ref 4.22–5.81)
RDW: 15.3 % (ref 11.5–15.5)
WBC: 40.3 10*3/uL — ABNORMAL HIGH (ref 4.0–10.5)
nRBC: 0 % (ref 0.0–0.2)

## 2019-01-28 LAB — COOXEMETRY PANEL
Carboxyhemoglobin: 1.2 % (ref 0.5–1.5)
Methemoglobin: 1.2 % (ref 0.0–1.5)
O2 Saturation: 69.3 %
Total hemoglobin: 9.4 g/dL — ABNORMAL LOW (ref 12.0–16.0)

## 2019-01-28 LAB — HEPARIN LEVEL (UNFRACTIONATED)
Heparin Unfractionated: 0.9 IU/mL — ABNORMAL HIGH (ref 0.30–0.70)
Heparin Unfractionated: 1.08 IU/mL — ABNORMAL HIGH (ref 0.30–0.70)
Heparin Unfractionated: 1.16 IU/mL — ABNORMAL HIGH (ref 0.30–0.70)

## 2019-01-28 LAB — COMPREHENSIVE METABOLIC PANEL
ALT: 27 U/L (ref 0–44)
AST: 55 U/L — ABNORMAL HIGH (ref 15–41)
Albumin: 1.7 g/dL — ABNORMAL LOW (ref 3.5–5.0)
Alkaline Phosphatase: 166 U/L — ABNORMAL HIGH (ref 38–126)
Anion gap: 11 (ref 5–15)
BUN: 98 mg/dL — ABNORMAL HIGH (ref 8–23)
CO2: 19 mmol/L — ABNORMAL LOW (ref 22–32)
Calcium: 7.9 mg/dL — ABNORMAL LOW (ref 8.9–10.3)
Chloride: 119 mmol/L — ABNORMAL HIGH (ref 98–111)
Creatinine, Ser: 3.04 mg/dL — ABNORMAL HIGH (ref 0.61–1.24)
GFR calc Af Amer: 21 mL/min — ABNORMAL LOW (ref >60)
GFR calc non Af Amer: 18 mL/min — ABNORMAL LOW (ref >60)
Glucose, Bld: 128 mg/dL — ABNORMAL HIGH (ref 70–99)
Potassium: 4 mmol/L (ref 3.5–5.1)
Sodium: 149 mmol/L — ABNORMAL HIGH (ref 135–145)
Total Bilirubin: 1.3 mg/dL — ABNORMAL HIGH (ref 0.3–1.2)
Total Protein: 5 g/dL — ABNORMAL LOW (ref 6.5–8.1)

## 2019-01-28 LAB — GLUCOSE, CAPILLARY
Glucose-Capillary: 118 mg/dL — ABNORMAL HIGH (ref 70–99)
Glucose-Capillary: 119 mg/dL — ABNORMAL HIGH (ref 70–99)
Glucose-Capillary: 142 mg/dL — ABNORMAL HIGH (ref 70–99)
Glucose-Capillary: 144 mg/dL — ABNORMAL HIGH (ref 70–99)
Glucose-Capillary: 142 mg/dL — ABNORMAL HIGH (ref 70–99)

## 2019-01-28 MED ORDER — METOLAZONE 5 MG PO TABS
5.0000 mg | ORAL_TABLET | Freq: Once | ORAL | Status: AC
  Administered 2019-01-28: 5 mg via ORAL
  Filled 2019-01-28: qty 1

## 2019-01-28 MED ORDER — FUROSEMIDE 10 MG/ML IJ SOLN: 80.0000 mg | Freq: Two times a day (BID) | INTRAMUSCULAR | Status: DC

## 2019-01-28 MED ORDER — HEPARIN (PORCINE) 25000 UT/250ML-% IV SOLN
850.0000 [IU]/h | INTRAVENOUS | Status: DC
  Administered 2019-01-28: 850 [IU]/h via INTRAVENOUS

## 2019-01-28 MED ORDER — FUROSEMIDE 10 MG/ML IJ SOLN
80.0000 mg | Freq: Two times a day (BID) | INTRAMUSCULAR | Status: DC
  Administered 2019-01-28 (×2): 80 mg via INTRAVENOUS
  Filled 2019-01-28 (×2): qty 8

## 2019-01-28 MED ORDER — HEPARIN (PORCINE) 25000 UT/250ML-% IV SOLN
400.0000 [IU]/h | INTRAVENOUS | Status: DC
  Administered 2019-01-28: 600 [IU]/h via INTRAVENOUS
  Filled 2019-01-28: qty 250

## 2019-01-28 NOTE — Progress Notes (Signed)
Received a call from Janifer Adie, one of the patient's daughters this evening.  Joy was concerned that the agreed upon updates concerning the patient's spontaneous breathing trials did not take place this morning when they were set to begin. I assured Joy that I would make a point to bring this up in nursing hand-off and ensure that this expectation for Mr. Sonny Masters care was honored. Caryl Asp is expecting a daily update concerning her father's SBTs between the hours of 9 and 11am.   Joy and her husband were very alarmed when Inez Catalina updated them that she thought no SBT was performed today and that the plan had changed from the goals of care meeting held yesterday according to Minor, NP. Betty left with the impression that regardless of patient condition, the plan was to one-way extubate Mr. Virgia Land tomorrow 12/4. This narrative was a misrepresentation of what I observed discussed by Minor, NP and I corrected Joy's impression of the conversation. Joy made it known to me that Inez Catalina was not a reliable historian and care decisions should be double checked with daughters before being implemented.

## 2019-01-28 NOTE — Progress Notes (Signed)
NAME:  Duane Burns, MRN:  [id number], DOB:  [birth year], LOS: 6 ADMISSION DATE:  01/12/2019,  CHIEF COMPLAINT:  Chest pain  Brief History   83 yo male who initally presented to [facility] with findings consistent with sepsis. Recent e. Coli bacteremia treated at [facility] (10/19-10/29). While at [facility], patient developed chest pain with multiple PACs and PVCs and was subsequently transferred to [facility] for further evaluation and treatment. On arrival, trops>27k. Lactate 3.7. white count 37k. No ST elevation on initial EKG however did develop new LBBB on later EKG. Cardiology consulted and spoke with intervential cardiology. Given septic state, renal failure, was not candidate for cath lab.  Imaging suggestive of biliary source of sepsis with cholecytitis present. Not a candidate for lap chole at that time so IR consulted with plans for perc cholecystotomy tube place 11/23 however on repeat imaging, gallbladder distension was resolved so tube placement was deferred.  Past Medical History  Myasthenia Gravis HTN Prostate cancer HLD RAS   Significant Hospital Events   11/20 admitted to [facility] with sepsis 11/21 transferred to [facility] for chest pain and EKG changes  Consults:  Cardiology gen surg IR  Procedures:  11/20 CVC 11/22 ETT >> 11/23 central IJ line placement  Significant Diagnostic Tests:  11/20 echo: EF 30-35%. Severely decreased LV function. Mild LVH. 11/21 echo: EF 20-25% with severely decreased LV function. No LVH. Diffuse hypokinesis most prominently of the anteroseptal wall and entire apex  11/20 CT c/a/p:pericholecystic edema. Left renal stone  11/21 abd [place]: thickened gallbladder wall with gallstones/sludge. Suspicious for cholecystitis. 11/23 CT a/p: interval resolution of gallbladder distension. New bilateral pleural effusions and bibasilar consolidation  11/21 CXR: worsened CHF pattern 11/22 CXR: progressively worsened pulm edema. Slight  increase in cardiomegaly 11/23 CXR s/p central line placement: improving bilateral pulm infiltrates.  11/24 CXR: pulm vasc congestion improving. Mild bibasilar airspace disease atelectasis vs infection 11/26 chest x-ray: Minimal bilateral vascular congestion. 01/19/2019: Overnight acutely unresponsive, code stroke, new cerebellar strokes, basilar artery angiography by interventional neuro.  Micro Data:  11/20 BC>> negative 11/22 BC >> ng  Antimicrobials:  merrem 11/21>> 01/21/2019 vanc 11/21>> 01/21/2019 01/21/2019 Unasyn>>11/28 Cefepime  Interim history/subjective:   Remains intubated off vasopressor support otherwise only 350 cc of a pressure support of 12.  Objective   Blood pressure 91/60, pulse (!) 106, temperature 99.6 F (37.6 C), temperature source Oral, resp. rate 20, height 5\' 8"  (1.727 m), weight 81.6 kg, SpO2 99 %.    Vent Mode: PSV;CPAP FiO2 (%):  [30 %] 30 % Set Rate:  [16 bmp] 16 bmp Vt Set:  [410 mL] 410 mL PEEP:  [5 cmH20] 5 cmH20 Pressure Support:  [5 [id number] cmH20] 12 cmH20 Plateau Pressure:  [17 cmH20-20 cmH20] 18 cmH20   Intake/Output Summary (Last 24 hours) at 01/28/2019 0953 Last data filed at 01/28/2019 0800 Gross per 24 hour  Intake 1536.28 ml  Output 1125 ml  Net 411.28 ml   Filed Weights   12/31/2018 0205 01/26/19 0650 01/27/19 0344  Weight: 78 kg 80.3 kg 81.6 kg    Examination: General: Frail elderly male who intermittently follows commands HEENT: Endotracheal tube is in place, no JVD or lymphadenopathy is appreciated Neuro: Follows some commands. CV: Heart sounds are distant but regular PULM: Diminished in the bases Vent pressure support ventilation with a pressure support of 12 FIO2 40% PEEP 5 RATE 18 VT spontaneous tidal volume of approximately 350 cc No chest x-ray on 01/28/2019 GI: soft,  bsx4 active  GU: Amber urine Extremities: warm/dry, 1+ edema  Skin: no rashes or lesions    Resolved Hospital Problem list   Urticaria/rash  11/27  Assessment & Plan:   Acute respiratory failure acquiring intubation and mechanical ventilation Acute pulmonary edema, improved Plan:  Currently on spontaneous breathing trial Once he completes successful spontaneous breathing trial to be extubated one-way Continue to evaluate on a daily basis  Septic shock, leukocytosis, unclear etiology, history of gram-negative bacteremia at previous hospitalization, shock state resolved Leukocytosis secondary to steroids, decreasing  Plan Off antibiotics Not requiring vasopressor support  Hypotensive, likely medication effect postop, now off Levophed    Acute cerebellar stroke Status post basilar artery angiography, no stenting EEG with diffuse encephalopathy no seizure Plan: Per neurology Currently on heparin drip   Ischemic cardiac event, newly depressed ejection fraction [id number] Acute  systolic heart failure, volume overload, resolving Trops >27k. New LBBB on EKG. BNP 4100 on admission.  11/21 echo with EF of 20-25% with severe hypokinesis of anteroseptum and apex. Severe Aortic Stenosis.   Cath deferred due to stroke and AKI Plan Hold Lasix  Thickened gallbladder on ultrasound examination but without significant fluid for drainage placement. P: Tube feeding as tolerated  AKI. Lab Results  Component Value Date   CREATININE 3.04 (H) 01/28/2019   CREATININE 2.94 (H) 01/27/2019   CREATININE 2.59 (H) 01/26/2019   Plan: Avoid nephrotoxins Avoid diuretics  Myasthenia Gravis.   Plan:  Currently on prednisone pyridostigmine   Hypernatremia Recent Labs  Lab 01/26/19 0500 01/27/19 0452 01/28/19 0420  NA 149* 148* 149*    Plan: Continue free water   GOC: I participated in goals of care discussion 12/2 with Wadie Lessen. NP from palliative care.. Wife 2 daughters and son present. I explained that he has improved today and has tolerated spontaneous breathing trial however due to his poor cardiac and renal situation  with new strokes and deconditioning, his long-term prognosis is guarded although I am hopeful for a successful extubation. He clearly expressed that he would not want permanent tubes and long-term mechanical ventilation. Son had a contrarian opinion and made several passive/aggressive comments but deferred to his mom for final decision making. We finally agreed to a one way extubation once his medical status is optimized and when he passes spontaneous breathing trial. If we do not reach this point, then we will rediscuss goals of care.   Best practice:  Diet: 01/22/2019 advance diet as tolerated Pain/Anxiety/Delirium protocol (if indicated): n/a DVT prophylaxis: Heparin Mobility: BR Code Status: Full once extubated changed to DNI Family Communication: 01/27/2019 family meeting with son and 2 daughters wife along with palliative care and Dr. Danne Baxter concerning goals of care.  The goal is once he has been optimized and passed spontaneous breathing trial due to one-way extubation will be performed. Disposition:ICU   App cct 34 min  Richardson Landry Minor ACNP Acute Care Nurse Practitioner [place] Please consult Amion 01/28/2019, 9:53 AM

## 2019-01-28 NOTE — Progress Notes (Signed)
Progress Note  Patient Name: Duane Burns Date of Encounter: 01/28/2019  Primary Cardiologist: Kate Sable, MD   Subjective   Remains intubated. Agitated. Failed vent wean.    Family meeting yesterday - continue aggressive care but he would not want long-term mechanical support. Plan 1-way extubation.   On heparin. Off pressors. Co-ox 69% Creatinine up to 3.04. WBC back up to 40k    Inpatient Medications    Scheduled Meds:  aspirin  81 mg Per Tube Daily   atorvastatin  80 mg Per Tube q1800   chlorhexidine       chlorhexidine gluconate (MEDLINE KIT)  15 mL Mouth Rinse BID   chlorhexidine gluconate (MEDLINE KIT)  15 mL Mouth Rinse BID   Chlorhexidine Gluconate Cloth  6 each Topical Daily   cholecalciferol  1,000 Units Per Tube Daily   clopidogrel  75 mg Per Tube Daily   sennosides  5 mL Per Tube BID   And   docusate  50 mg Per Tube BID   feeding supplement (PRO-STAT SUGAR FREE 64)  30 mL Per Tube BID   Gerhardt's butt cream   Topical BID   mouth rinse  15 mL Mouth Rinse 10 times per day   predniSONE  10 mg Per Tube Q breakfast   pyridostigmine  60 mg Per Tube TID   sodium chloride flush  10-40 mL Intracatheter Q12H   sodium chloride flush  3 mL Intravenous Q12H   Continuous Infusions:  sodium chloride 250 mL (01/27/19 1037)   dexmedetomidine (PRECEDEX) IV infusion Stopped (01/26/19 1125)   famotidine (PEPCID) IV 20 mg (01/27/19 2126)   feeding supplement (VITAL AF 1.2 CAL) 50 mL/hr at 01/28/19 0700   fentaNYL infusion INTRAVENOUS 100 mcg/hr (01/28/19 0700)   heparin 850 Units/hr (01/28/19 0700)   PRN Meds: sodium chloride, acetaminophen **OR** acetaminophen, bisacodyl, diphenhydrAMINE, fentaNYL (SUBLIMAZE) injection, guaiFENesin, haloperidol lactate, levalbuterol, midazolam, ondansetron **OR** ondansetron (ZOFRAN) IV, polyethylene glycol, sennosides, sodium chloride flush, sodium chloride flush, traZODone   Vital Signs    Vitals:   01/28/19 0600 01/28/19 0700 01/28/19 0737 01/28/19 0812  BP: 101/64 (!) 101/56  91/60  Pulse: 99 (!) 102  (!) 106  Resp: _0 Temp:   99.6 F (37.6 C)   TempSrc:   Oral   SpO2: 98% 97%  99%  Weight:      Height:        Intake/Output Summary (Last 24 hours) at 01/28/2019 0821 Last data filed at 01/28/2019 0700 Gross per 24 hour  Intake 1495.32 ml  Output 1125 ml  Net 370.32 ml   Filed Weights   01/08/2019 0205 01/26/19 0650 01/27/19 0344  Weight: 78 kg 80.3 kg 81.6 kg    Telemetry    Sinus tach 100-110 Personally reviewed   Physical Exam   PHYSICAL EXAM: General:  Critically ill appearing WM, awake on vent. agitated HEENT: normal +ETT Neck: supple. RIJ TLC Carotids 2+ bilat; no bruits. No lymphadenopathy or thryomegaly appreciated. Cor: PMI nondisplaced. Regular rate & rhythm. No rubs, gallops or murmurs. Lungs: coarse Abdomen: soft, nontender, nondistended. No hepatosplenomegaly. No bruits or masses. Good bowel sounds. Extremities: no cyanosis, clubbing, rash, edema + scds Neuro: awake on vent agitated  Labs    Chemistry Recent Labs  Lab 01/26/19 0500 01/27/19 0452 01/28/19 0420  NA 149* 148* 149*  K 4.2 4.0 4.0  CL 115* 115* 119*  CO2 16* 19* 19*  GLUCOSE 107* 123* 128*  BUN 63* 83* 98*  CREATININE 2.59* 2.94* 3.04*  CALCIUM 8.2* 7.8* 7.9*  PROT  --  4.6* 5.0*  ALBUMIN  --  1.8* 1.7*  AST  --  42* 55*  ALT  --  23 27  ALKPHOS  --  133* 166*  BILITOT  --  1.2 1.3*  GFRNONAA 22* 19* 18*  GFRAA 26* 22* 21*  ANIONGAP 18* 14 11     Hematology Recent Labs  Lab 01/04/2019 1025 01/10/2019 1535 01/27/19 0452 01/28/19 0420  WBC 35.1*  --  23.5* 40.3*  RBC 3.41*  --  3.21* 3.55*  HGB 10.0* 9.9* 9.3* 10.2*  HCT 30.9* 29.0* 30.1* 33.0*  MCV 90.6  --  93.8 93.0  MCH 29.3  --  29.0 28.7  MCHC 32.4  --  30.9 30.9  RDW 14.6  --  14.9 15.3  PLT 233  --  241 253    Cardiac EnzymesNo results for input(s): TROPONINI in the last 168 hours. No results for  input(s): TROPIPOC in the last 168 hours.   BNP No results for input(s): BNP, PROBNP in the last 168 hours.   DDimer No results for input(s): DDIMER in the last 168 hours.   Radiology    Dg Chest Port 1 View  Result Date: 01/27/2019 CLINICAL DATA:  Respiratory failure.  Intubated. EXAM: PORTABLE CHEST 1 VIEW COMPARISON:  01/24/2019 FINDINGS: The endotracheal tube is 4.5 cm above the carina. The NG tube is coursing down the esophagus and into the stomach. Stable position of the right IJ central venous catheter. The heart is mildly enlarged but stable. Stable tortuosity, ectasia and calcification of the thoracic aorta. Low lung volumes with vascular crowding and bibasilar atelectasis. Mild central vascular congestion but no overt pulmonary edema or definite pleural effusions. The bony thorax is intact. IMPRESSION: ET and NG tubes in good position. Low lung volumes with vascular crowding and bibasilar atelectasis. Electronically Signed   By: Marijo Sanes M.D.   On: 01/27/2019 08:17   Vas [place] Lower Extremity Venous (dvt)  Result Date: 01/26/2019  Lower Venous Study Indications: Stroke.  Limitations: Bandages. Comparison Study: no prior Performing Technologist: Abram Sander RVS  Examination Guidelines: A complete evaluation includes B-mode imaging, spectral Doppler, color Doppler, and power Doppler as needed of all accessible portions of each vessel. Bilateral testing is considered an integral part of a complete examination. Limited examinations for reoccurring indications may be performed as noted.  +---------+---------------+---------+-----------+----------+--------------+  RIGHT     Compressibility Phasicity Spontaneity Properties Thrombus Aging  +---------+---------------+---------+-----------+----------+--------------+  CFV                                                        Not visualized  +---------+---------------+---------+-----------+----------+--------------+  SFJ                                                         Not visualized  +---------+---------------+---------+-----------+----------+--------------+  FV Prox  Not visualized  +---------+---------------+---------+-----------+----------+--------------+  FV Mid    Full                                                             +---------+---------------+---------+-----------+----------+--------------+  FV Distal Full                                                             +---------+---------------+---------+-----------+----------+--------------+  PFV                                                        Not visualized  +---------+---------------+---------+-----------+----------+--------------+  POP       Full            Yes       Yes                                    +---------+---------------+---------+-----------+----------+--------------+  PTV       Full                                                             +---------+---------------+---------+-----------+----------+--------------+  PERO      Full                                                             +---------+---------------+---------+-----------+----------+--------------+   +---------+---------------+---------+-----------+----------+--------------+  LEFT      Compressibility Phasicity Spontaneity Properties Thrombus Aging  +---------+---------------+---------+-----------+----------+--------------+  CFV       Full            Yes       Yes                                    +---------+---------------+---------+-----------+----------+--------------+  SFJ       Full                                                             +---------+---------------+---------+-----------+----------+--------------+  FV Prox   Full                                                             +---------+---------------+---------+-----------+----------+--------------+  FV Mid    Full                                                              +---------+---------------+---------+-----------+----------+--------------+  FV Distal Full                                                             +---------+---------------+---------+-----------+----------+--------------+  PFV       Full                                                             +---------+---------------+---------+-----------+----------+--------------+  POP       Full            Yes       Yes                                    +---------+---------------+---------+-----------+----------+--------------+  PTV       Full                                                             +---------+---------------+---------+-----------+----------+--------------+  PERO                                                       Not visualized  +---------+---------------+---------+-----------+----------+--------------+     Summary: Right: There is no evidence of deep vein thrombosis in the lower extremity. However, portions of this examination were limited- see technologist comments above. No cystic structure found in the popliteal fossa. Left: There is no evidence of deep vein thrombosis in the lower extremity. No cystic structure found in the popliteal fossa.  *See table(s) above for measurements and observations. Electronically signed by Deitra Mayo MD on 01/26/2019 at 12:19:37 PM.    Final     Cardiac Studies   Echo 11/21  1. Left ventricular ejection fraction, by visual estimation, is 20 to 25%. The left ventricle has severely decreased function. There is no left ventricular hypertrophy.  2. Diffuse hypokinesis with most prominent severe hypokinesis of the anteroseptal wall and the entire apex.  3. Global right ventricle was not assessed.The right ventricular size is not assessed. Right vetricular wall thickness was not assessed.  4. Left atrial size was not assessed.  5. Right atrial size was not assessed.  6. The pericardium was not assessed.  7. The mitral valve was not assessed. not  assessed mitral valve regurgitation.  8. The tricuspid valve is not assessed. Tricuspid valve regurgitation  not assessed.  9. Aortic valve regurgitation Not assessed. 10. The aortic valve was not assessed. Aortic valve regurgitation Not assessed. 11. The pulmonic valve was not assessed. Pulmonic valve regurgitation Not assessed. 12. Not assessed. 13. The interatrial septum was not assessed.  ECHO 11/20 12/2018 LEFT VENTRICLE PLAX 2D LVIDd: 4.17 cm LVIDs: 2.50 cm LV PW: 1.26 cm LV IVS: 1.30 cm LVOT diam: 1.80 cm LV SV: 55 ml LV SV Index: 26.59 LVOT Area: 2.54 cm  06/2018 +------------------+------------++  AORTIC VALVE      +------------------+------------++  AV Area (Vmax):  0.88 cm    +------------------+------------++  AV Area (Vmean):  0.89 cm    +------------------+------------++  AV Area (VTI):  0.90 cm    +------------------+------------++  AV Vmax:  372.81 cm/s    +------------------+------------++  AV Vmean:  255.916 cm/s   +------------------+------------++  AV VTI:  0.923 m    +------------------+------------++  AV Peak Grad:  55.6 mmHg    +------------------+------------++  AV Mean Grad:  30.9 mmHg    +------------------+------------++  LVOT Vmax:  86.15 cm/s    +------------------+------------++  LVOT Vmean:  60.100 cm/s    +------------------+------------++  LVOT VTI:  0.220 m    +------------------+------------++  LVOT/AV VTI ratio: 0.24    +------------------+------------++  AR PHT:  779 msec    +------------------+------------++    Patient Profile     Duane Burns is a 83yo male with PMH HTN, moderate to severe AS, myasthenia gravis, HLD, recent e. Coli bacteremia treated at [facility] (10/19-10/29) presenting with sepsis secondary to acute cholecystitis with acute HF and  severe aortic stenosis.   Assessment & Plan    1. Shock - combined sepsis/cardiogenic  - PCT very high (67 11/23) - Initial sepsis felt to be cholecystitis but now felt less likely - Meropenem switched to Unasyn on 11/26. (vanc stopped). Switched to zosyn due to salt load. Developed rash. (likely PCN related) Completed cefipime. Cx negative.  - WBC climbing back up. Tmax 99.6  2. Acute Systolic HF -> cardiogenic shock - Echo newly down to 20-25% with severe AS from 60-65% with mod-severe AS on 06/2018. - hs Trop > 27K  - suspect combination of ischemic and septic CM - cath deferred due to stroke and AKI.  - Creatinine worse. Continue medical therapy - Weight up 10 pounds from previous. Will give IV lasix to facilitate chances for successful extubation  3. Acute hypoxic respiratory failure - due to HF and sepsis - extubated 11/26 > retintubated 11/30 2/2 acute CVA with severe dysarthria.  - CCM managing -> plan as above  4. Severe Aortic Stenosis - Worsened AS since May of this year.  - ideally would plan TAVR but likely no longer a candidate  5. Acute cerebellar/temporal stroke - reintubated for airway protection - Stat CT showed acute subacute cerebellar infarcts L>R and possible basilar artery thrombus - stat MRI/MRA showed infarct to right temporal lobe, high grade stenosis non-dominant left vertebral artery but unclear if thrombus present in basilar artery  - taken for angiogram but not given tpa - Neurology following.  - now on heparin   6. AKI - due to shock/ATN - baseline creatinine 1.6. continues to rise, now 2.94-> 3.04  7. Myasthenia gravis  - weaned off stress-dose steroids. Now back on home dose prednisone 67m daily  - watch for precipitation of MG crisis    8. PAF - had ? AF overnight on 11/23 - now in NSR with frequent PVCs. Afib has not recurred  -  Heparin stopped, on LMWH for DVT prophylaxis.  - Now back on heparin. No obvious bleeding. Discussed dosing  with PharmD personally.  9. New LBBB - suspect ischemic  10. Hypernatremia -  Can give free water  - Na 149 today  11. Acute delirium  - CCM managing   Overall prognosis quite poor due to MSOF. Will continue aggressive medical therapy for now as above. Plan attempt at one-way extubation when able.   CRITICAL CARE Performed by: Glori Bickers  Total critical care time: 35 minutes  Critical care time was exclusive of separately billable procedures and treating other patients.  Critical care was necessary to treat or prevent imminent or life-threatening deterioration.  Critical care was time spent personally by me (independent of midlevel providers or residents) on the following activities: development of treatment plan with patient and/or surrogate as well as nursing, discussions with consultants, evaluation of patient's response to treatment, examination of patient, obtaining history from patient or surrogate, ordering and performing treatments and interventions, ordering and review of laboratory studies, ordering and review of radiographic studies, pulse oximetry and re-evaluation of patient's condition.     Signed, Glori Bickers, MD  01/28/2019, 8:21 AM    8:21 AM

## 2019-01-28 NOTE — Progress Notes (Signed)
Chaplain provided follow-up visit with Mrs. Emile.  Mrs. Fjeld has continuously shared her own health battles of dealing with her diabetes.  She says that just last night she had a spell and could not remember what she was saying or doing.  Chaplain continuously urges Mrs. Lockaby to go see her doctor.    Mrs. Gravelle still has questions around if Mr. Schult will survive or not.  The NP did a great job of explaining to her what they were looking for in terms of taking Mr. Whalley off the ventilator.  The NP explained to Mrs. Solivan what comfort care is if Mr. Hassett does not do well off the ventilator.  Mrs. Bedillion seemed to understand.  Mrs. Hellard has expressed her feelings around not knowing what to do if Mr. Lessard does not survive, while also acknowledging that Mr. Amato would not want to be kept alive in such a state.  She says, "We have been together since I was 73..." Chaplain urges her to have some discussions with her family about what her life will be like in terms of living if Mr. Rantz does transition.    Chaplain continues to offer spiritual presence, conversation and support.    Chaplain will follow-up.

## 2019-01-28 NOTE — Progress Notes (Signed)
ANTICOAGULATION CONSULT NOTE - Duane Burns for heparin Indication: stroke  Allergies  Allergen Reactions  . Penicillins Rash    Noted after change from Unasyn to Zosyn 01/22/19    Patient Measurements: Height: 5\' 8"  (172.7 cm) Weight: 179 lb 14.3 oz (81.6 kg) IBW/kg (Calculated) : 68.4  Vital Signs: Temp: 99 F (37.2 C) (12/03 1151) Temp Source: Oral (12/03 1151) BP: 101/64 (12/03 1130) Pulse Rate: 99 (12/03 1130)  Labs: Recent Labs    01/08/2019 1535 01/26/19 0500 01/27/19 0452 01/28/19 0058 01/28/19 0420 01/28/19 1036  HGB 9.9*  --  9.3*  --  10.2*  --   HCT 29.0*  --  30.1*  --  33.0*  --   PLT  --   --  241  --  253  --   HEPARINUNFRC  --   --   --  1.08*  --  1.16*  CREATININE  --  2.59* 2.94*  --  3.04*  --     Estimated Creatinine Clearance: 18.4 mL/min (A) (by C-G formula based on SCr of 3.04 mg/dL (H)).  Assessment: 78 yoM admitted with sepsis and acute CHF now with concern for embolic CVA. Heparin ordered per CCM, cleared by neuro.   Heparin level remains elevated despite holding and rate reduction earlier. CBC stable this morning.  Goal of Therapy:  Heparin level 0.3 - 0.5 units/ml Monitor platelets by anticoagulation protocol: Yes   Plan:  -Hold heparin x1hr -Restart heparin 600 units/h no bolus -Recheck heparin level in 8h   Arrie Senate, PharmD, BCPS Clinical Pharmacist [phone] Please check AMION for all [place] numbers 01/28/2019

## 2019-01-28 NOTE — Progress Notes (Signed)
STROKE TEAM PROGRESS NOTE   INTERVAL HISTORY Wife at the bedside.  Patient still intubated, agitated, seems to be mad with breathing tube and mittens bilaterally.  Able to follow commands with close eyes, showing the thumb, wiggle toes, and making fist, but psychomotor slowing.  However did not showing 2 fingers were sticking out tongue.  Family hopeful extubation in the next couple days.  Vitals:   01/28/19 0830 01/28/19 0900 01/28/19 0930 01/28/19 1000  BP: 103/66 (!) 87/62 (!) 95/59 105/66  Pulse: (!) 102 98 95 97  Resp: 20 16 19 16   Temp:      TempSrc:      SpO2: 99% 98% 100% 99%  Weight:      Height:        CBC:  Recent Labs  Lab 01/23/19 0412  01/27/19 0452 01/28/19 0420  WBC 29.5*   < > 23.5* 40.3*  NEUTROABS 26.8*  --   --   --   HGB 11.5*   < > 9.3* 10.2*  HCT 35.4*   < > 30.1* 33.0*  MCV 90.3   < > 93.8 93.0  PLT 150   < > 241 253   < > = values in this interval not displayed.    Basic Metabolic Panel:  Recent Labs  Lab 01/27/19 0452 01/27/19 1700 01/28/19 0420  NA 148*  --  149*  K 4.0  --  4.0  CL 115*  --  119*  CO2 19*  --  19*  GLUCOSE 123*  --  128*  BUN 83*  --  98*  CREATININE 2.94*  --  3.04*  CALCIUM 7.8*  --  7.9*  MG 2.2 2.3  --   PHOS 5.1* 5.4*  --    Lipid Panel:     Component Value Date/Time   CHOL 88 01/26/2019 0500   CHOL 171 01/27/2018 0911   TRIG 101 01/26/2019 0500   HDL 18 (L) 01/26/2019 0500   HDL 61 01/27/2018 0911   CHOLHDL 4.9 01/26/2019 0500   VLDL 20 01/26/2019 0500   LDLCALC 50 01/26/2019 0500   LDLCALC 96 01/27/2018 0911   HgbA1c:  Lab Results  Component Value Date   HGBA1C 6.2 (H) 01/24/2019   Urine Drug Screen:     Component Value Date/Time   LABOPIA NONE DETECTED 11/10/2014 1740   COCAINSCRNUR NONE DETECTED 11/10/2014 1740   LABBENZ NONE DETECTED 11/10/2014 1740   AMPHETMU NONE DETECTED 11/10/2014 1740   THCU NONE DETECTED 11/10/2014 1740   LABBARB NONE DETECTED 11/10/2014 1740    Alcohol Level      Component Value Date/Time   ETH <5 11/10/2014 1621    IMAGING Dg Chest Port 1 View  Result Date: 01/27/2019 CLINICAL DATA:  Respiratory failure.  Intubated. EXAM: PORTABLE CHEST 1 VIEW COMPARISON:  01/24/2019 FINDINGS: The endotracheal tube is 4.5 cm above the carina. The NG tube is coursing down the esophagus and into the stomach. Stable position of the right IJ central venous catheter. The heart is mildly enlarged but stable. Stable tortuosity, ectasia and calcification of the thoracic aorta. Low lung volumes with vascular crowding and bibasilar atelectasis. Mild central vascular congestion but no overt pulmonary edema or definite pleural effusions. The bony thorax is intact. IMPRESSION: ET and NG tubes in good position. Low lung volumes with vascular crowding and bibasilar atelectasis. Electronically Signed   By: Marijo Sanes M.D.   On: 01/27/2019 08:17   Cerebral Angio 01/04/2019 1.Occluded Lt VA at origin with no  distal reconstitution. 2.Widely patent basilar artery.  EEG Abnormality - Continuous slow, generalized IMPRESSION: This study is suggestive of severe diffuse encephalopathy, nonspecific to etiology. No seizures or epileptiform discharges were seen throughout the recording.   PHYSICAL EXAM    Temp:  [97.5 F (36.4 C)-99.6 F (37.6 C)] 99.6 F (37.6 C) (12/03 0737) Pulse Rate:  [72-110] 97 (12/03 1000) Resp:  [15-26] 16 (12/03 1000) BP: (87-137)/(43-90) 105/66 (12/03 1000) SpO2:  [94 %-100 %] 99 % (12/03 1000) FiO2 (%):  [30 %] 30 % (12/03 0812)  General - Well nourished, well developed, intubated on low-dose fentanyl.  Ophthalmologic - fundi not visualized due to noncooperation.  Cardiovascular - Regular rate and rhythm.  Neuro - intubated on low-dose fentanyl, eyes open, able to follow most of the simple peripheral and central commands, but psychomotor slowing. Still has intermittent bilateral upper extremity flailing with agitation, PERRL, corneal reflexes  present bilaterally, gag and cough present. Breathing over the vent.  Facial symmetry not able to test due to ET tube.  Tongue protrusion not corporative. BUE on restrain but against gravity and flailing spontaneously. BLE mild withdraw on pain stimulation, but able to wiggle toes on requests. Sensation, coordination and gait not tested.   ASSESSMENT/PLAN Duane Burns is a 83 y.o. male with history of hypertension, hyperlipidemia, severe aortic stenosis, myasthenia gravis versus Guillain-Barr presenting to [facility] 11/20 and  transferred 11/20 to [facility] for sepsis, acute renal failure and acute respiratory failure in the setting of pulmonary edema and possible pneumonia. Found to have acute on chronic systolic heart failure w/ EF 20%. Extubated 11/26 until 11/30 0430 when he became suddenly unresponsive.  CT head showed L & R cerebellar infarcts, concern for hyperdense basilar artery.  MRI confirmed cerebell ar stroke but MRA did not confirm basilar thrombus. Intubated. Sent to IR for diagnostic cerebral angiogram.   Acute encephalopathy  11/30 Unresponsiveness, reported disconjugate eyes, clenching jaws and flaccid extremities  Received ativan with some improvement  Now awake alert able to commands but psychomotor slowing  Etiology not clear, could be seizure with post ictal. EEG no Sz but encephalopathy  Several factors for lowered seizure threshold, including AKI, severe leukocytosis and encephalopathy  Posterior stroke is in DDx but MRA and angiogram showed patent BA, the stroke on CT and MRI was subacute instead of acute, felt not able to explain pt presentation  Continue neuro check and seizure precaution  Continue to correct metabolic derangement   Stroke:   L> R cerebellar and R temporal lobe infarcts embolic secondary to unknown source - low EF and possible AF 11/23  Code Stroke CT head L>R superior cerebellar infarcts. BA density ? Thrombus. ASPECTS  10.     MRI  L>R cerebellar infarcts. R periventricular white matter infarct medial R temporal lobe. Small vessel disease. Atrophy.   MRA L VA occlusion. B fetal PCAs. Flow present in BA.   Cerebral Angio L VA occlusion. Patent BA.   2D Echo EF 20-25%. Moderate to severe AS   EEG no sz. Severe diffuse encephalopathy  LE venous doppler no DVT  LDL 96  HgbA1c 6.2  Heparin 5000 units sq tid for VTE prophylaxis  aspirin 81 mg daily prior to admission, now on triple therapy with heparin IV and aspirin 81 as well as Plavix 75.  Therapy recommendations:  SNF  Disposition:  pending   Palliative care discussion decisions: one-way extubation followed by DNR/DNI when medically stable extubation, no PEG  Possible PAF  ? AF overnight on 11/23  Now in NSR w/ PVCs. No recurrent AF  Now on IV heparin  Cardiomyopathy  11/20 EF 30 to 35%  11/21 EF 20 to 25%  Treated for pulmonary edema  On DAPT + IV heparin  Additional lasix today to optimize extubation  On heparin IV  Hypotension Hx Hypertension  Stable on the low end  Off Levophed . Avoid low BP . Long-term BP goal normotensive  AKI, worsening  Creatinine 2.07-2.3-2.29-2.59-2.94-3.04   On IV fluid  Management per primary team  Acute hypoxic respiratory failure   in the setting of pulmonary edema and possible pneumonia.   Intubated->Extubated->Reintubated  Sedated with low-dose fentanyl  Failed weaning trial yesterday. Dr. Elsworth Soho hopeful for extubation over the next 1-2 days  Septic shock  Tapered off Levophed and neo  Leukocytosis WBC 29.5-30.8-35.1-23.5-40.3  Afebrile  Off abx now   Hyperlipidemia  Home meds:  Fish oil and pravachol 80  Now on lipitor 80  LDL 96, goal < 70  Continue statin at discharge  Diabetes, type II, controlled  HgbA1c 6.2, goal < 7.0   SSI   CBG monitoring  Malnutrition / Dysphagia . Secondary to stroke . NPO . On TF now . Family do not feel pt would  want PEG after palliative care discussion  Other Stroke Risk Factors  Advanced age  Hx Cigarette smoker  CAD w/ ischemic cardiac event, not a candidate for acute w/u  severe aortic stenosis, no longer a TAVR candidate  acute on chronic systolic heart failure w/ EF 20% d/t ischemia and sepsis  Agitation and delirium  Other Active Problems  Hypokalemia  Hypernatremia   Urticaria ? R/t PCN  Cholecystitis w/ GB distension, resolved on own  Hx myasthenia gravis versus Guillain-Barr - on Mestinon and prednisone  anemia 10.0-9.9-9.3  Hospital day # 13  This patient is critically ill due to acute encephalopathy, stroke, possible seizure, cardiomyopathy, hypotension, sepsis and at significant risk of neurological worsening, death form recurrent stroke, status epilepticus, septic shock, cardiogenic shock, heart failure. This patient's care requires constant monitoring of vital signs, hemodynamics, respiratory and cardiac monitoring, review of multiple databases, neurological assessment, discussion with family, other specialists and medical decision making of high complexity. I spent 35 minutes of neurocritical care time in the care of this patient. I had long discussion with wife at bedside, updated pt current condition, treatment plan and potential prognosis, and answered all the questions. She expressed understanding and appreciation.    Duane Hawking, MD PhD Stroke Neurology 01/28/2019 10:42 AM    To contact Stroke Continuity provider, please refer to [email or web address] After hours, contact General Neurology

## 2019-01-28 NOTE — Progress Notes (Signed)
[place] for Heparin Indication: stroke  Allergies  Allergen Reactions  . Penicillins Rash    Noted after change from Unasyn to Zosyn 01/22/19    Patient Measurements: Height: 5\' 8"  (172.7 cm) Weight: 179 lb 14.3 oz (81.6 kg) IBW/kg (Calculated) : 68.4  Vital Signs: Temp: 97.5 F (36.4 C) (12/02 2300) Temp Source: Oral (12/02 2300) BP: 101/61 (12/03 0000) Pulse Rate: 86 (12/03 0000)  Labs: Recent Labs    01/22/2019 0231  01/24/2019 1025 01/02/2019 1535 01/26/19 0500 01/27/19 0452 01/28/19 0058  HGB  --    < > 10.0* 9.9*  --  9.3*  --   HCT  --    < > 30.9* 29.0*  --  30.1*  --   PLT  --   --  233  --   --  241  --   HEPARINUNFRC  --   --   --   --   --   --  1.08*  CREATININE 2.29*  --   --   --  2.59* 2.94*  --    < > = values in this interval not displayed.    Estimated Creatinine Clearance: 19.1 mL/min (A) (by C-G formula based on SCr of 2.94 mg/dL (H)).  Assessment: 83 yo m originally presented with sepsis, now concern for embolic CVA  CBC stable  12/3 AM update:  Heparin level elevated Heparin level drawn centrally and running peripherally  No issues per RN  Goal of Therapy:  Heparin level 0.3 - 0.5 units/ml Monitor platelets by anticoagulation protocol: Yes   Plan:  Hold heparin x 1 hr Re-start heparin at 850 units/hr at 0300 Re-check heparin level at [place], PharmD, [place] Pharmacist Phone: [phone]

## 2019-01-29 ENCOUNTER — Encounter (HOSPITAL_COMMUNITY): Payer: Self-pay

## 2019-01-29 ENCOUNTER — Inpatient Hospital Stay (HOSPITAL_COMMUNITY): Payer: Medicare HMO

## 2019-01-29 LAB — CBC WITH DIFFERENTIAL/PLATELET
Abs Immature Granulocytes: 1.66 10*3/uL — ABNORMAL HIGH (ref 0.00–0.07)
Basophils Absolute: 0.1 10*3/uL (ref 0.0–0.1)
Basophils Relative: 0 %
Eosinophils Absolute: 0.1 10*3/uL (ref 0.0–0.5)
Eosinophils Relative: 0 %
HCT: 30.6 % — ABNORMAL LOW (ref 39.0–52.0)
Hemoglobin: 9.5 g/dL — ABNORMAL LOW (ref 13.0–17.0)
Immature Granulocytes: 3 %
Lymphocytes Relative: 2 %
Lymphs Abs: 0.9 10*3/uL (ref 0.7–4.0)
MCH: 29 pg (ref 26.0–34.0)
MCHC: 31 g/dL (ref 30.0–36.0)
MCV: 93.3 fL (ref 80.0–100.0)
Monocytes Absolute: 2 10*3/uL — ABNORMAL HIGH (ref 0.1–1.0)
Monocytes Relative: 4 %
Neutro Abs: 51.9 10*3/uL — ABNORMAL HIGH (ref 1.7–7.7)
Neutrophils Relative %: 91 %
Platelets: 260 10*3/uL (ref 150–400)
RBC: 3.28 MIL/uL — ABNORMAL LOW (ref 4.22–5.81)
RDW: 15.7 % — ABNORMAL HIGH (ref 11.5–15.5)
WBC: 56.6 10*3/uL (ref 4.0–10.5)
nRBC: 0 % (ref 0.0–0.2)

## 2019-01-29 LAB — COMPREHENSIVE METABOLIC PANEL
ALT: 28 U/L (ref 0–44)
AST: 49 U/L — ABNORMAL HIGH (ref 15–41)
Albumin: 1.4 g/dL — ABNORMAL LOW (ref 3.5–5.0)
Alkaline Phosphatase: 157 U/L — ABNORMAL HIGH (ref 38–126)
Anion gap: 13 (ref 5–15)
BUN: 112 mg/dL — ABNORMAL HIGH (ref 8–23)
CO2: 18 mmol/L — ABNORMAL LOW (ref 22–32)
Calcium: 7.5 mg/dL — ABNORMAL LOW (ref 8.9–10.3)
Chloride: 117 mmol/L — ABNORMAL HIGH (ref 98–111)
Creatinine, Ser: 3.77 mg/dL — ABNORMAL HIGH (ref 0.61–1.24)
GFR calc Af Amer: 16 mL/min — ABNORMAL LOW (ref >60)
GFR calc non Af Amer: 14 mL/min — ABNORMAL LOW (ref >60)
Glucose, Bld: 148 mg/dL — ABNORMAL HIGH (ref 70–99)
Potassium: 4.2 mmol/L (ref 3.5–5.1)
Sodium: 148 mmol/L — ABNORMAL HIGH (ref 135–145)
Total Bilirubin: 0.7 mg/dL (ref 0.3–1.2)
Total Protein: 4.6 g/dL — ABNORMAL LOW (ref 6.5–8.1)

## 2019-01-29 LAB — COOXEMETRY PANEL
Carboxyhemoglobin: 1.2 % (ref 0.5–1.5)
Methemoglobin: 1.3 % (ref 0.0–1.5)
O2 Saturation: 63.6 %
Total hemoglobin: 10.2 g/dL — ABNORMAL LOW (ref 12.0–16.0)

## 2019-01-29 LAB — GLUCOSE, CAPILLARY
Glucose-Capillary: 144 mg/dL — ABNORMAL HIGH (ref 70–99)
Glucose-Capillary: 137 mg/dL — ABNORMAL HIGH (ref 70–99)
Glucose-Capillary: 146 mg/dL — ABNORMAL HIGH (ref 70–99)
Glucose-Capillary: 171 mg/dL — ABNORMAL HIGH (ref 70–99)
Glucose-Capillary: 176 mg/dL — ABNORMAL HIGH (ref 70–99)
Glucose-Capillary: 123 mg/dL — ABNORMAL HIGH (ref 70–99)

## 2019-01-29 LAB — C DIFFICILE QUICK SCREEN W PCR REFLEX
C Diff antigen: NEGATIVE
C Diff interpretation: NOT DETECTED
C Diff toxin: NEGATIVE

## 2019-01-29 LAB — PHOSPHORUS: Phosphorus: 6 mg/dL — ABNORMAL HIGH (ref 2.5–4.6)

## 2019-01-29 LAB — HEPARIN LEVEL (UNFRACTIONATED): Heparin Unfractionated: 0.37 IU/mL (ref 0.30–0.70)

## 2019-01-29 LAB — MAGNESIUM: Magnesium: 2.2 mg/dL (ref 1.7–2.4)

## 2019-01-29 LAB — PROCALCITONIN: Procalcitonin: 7.16 ng/mL

## 2019-01-29 MED ORDER — SODIUM CHLORIDE 0.9 % IV SOLN
1.0000 g | INTRAVENOUS | Status: DC
  Administered 2019-01-29 – 2019-01-30 (×2): 1 g via INTRAVENOUS
  Filled 2019-01-29 (×3): qty 1

## 2019-01-29 MED ORDER — FUROSEMIDE 10 MG/ML IJ SOLN
160.0000 mg | Freq: Two times a day (BID) | INTRAVENOUS | Status: DC
  Administered 2019-01-29: 160 mg via INTRAVENOUS
  Filled 2019-01-29 (×2): qty 16

## 2019-01-29 NOTE — Progress Notes (Signed)
ANTICOAGULATION CONSULT NOTE - [place] for heparin Indication: stroke  Allergies  Allergen Reactions  . Penicillins Rash    Noted after change from Unasyn to Zosyn 01/22/19    Patient Measurements: Height: 5\' 8"  (172.7 cm) Weight: 182 lb 1.6 oz (82.6 kg) IBW/kg (Calculated) : 68.4  Vital Signs: Temp: 98.8 F (37.1 C) (12/04 0700) Temp Source: Oral (12/04 0700) BP: 96/65 (12/04 1000) Pulse Rate: 98 (12/04 1000)  Labs: Recent Labs    01/27/19 0452  01/28/19 0420 01/28/19 1036 01/28/19 2302 01/29/19 0327 01/29/19 0833  HGB 9.3*  --  10.2*  --   --  9.5*  --   HCT 30.1*  --  33.0*  --   --  30.6*  --   PLT 241  --  253  --   --  260  --   HEPARINUNFRC  --    < >  --  1.16* 0.90*  --  0.37  CREATININE 2.94*  --  3.04*  --   --  3.77*  --    < > = values in this interval not displayed.    Estimated Creatinine Clearance: 16.1 mL/min (A) (by C-G formula based on SCr of 3.77 mg/dL (H)).  Assessment: 32 yoM admitted with sepsis and acute CHF now with concern for embolic CVA. Heparin ordered per CCM, cleared by neuro.   Heparin level now therapeutic on reduced dose, Hgb and pltc stable.  Goal of Therapy:  Heparin level 0.3 - 0.5 units/ml Monitor platelets by anticoagulation protocol: Yes   Plan:  -Continue heparin 400 units/h -Will defer confirmatory level with unclear GOC -Recheck heparin level and CBC with am labs   Arrie Senate, PharmD, BCPS Clinical Pharmacist [phone] Please check AMION for all [place] numbers 01/29/2019

## 2019-01-29 NOTE — Progress Notes (Signed)
NAME:  Duane Burns, MRN:  [id number], DOB:  Apr 09, 1935, LOS: 38 ADMISSION DATE:  01/19/2019,  CHIEF COMPLAINT:  Chest pain  Brief History   83 yo male who initally presented to [facility] with findings consistent with sepsis. Recent e. Coli bacteremia treated at [facility] (10/19-10/29). While at [facility], patient developed chest pain with multiple PACs and PVCs and was subsequently transferred to [facility] for further evaluation and treatment. On arrival, trops>27k. Lactate 3.7. white count 37k. No ST elevation on initial EKG however did develop new LBBB on later EKG. Cardiology consulted and spoke with intervential cardiology. Given septic state, renal failure, was not candidate for cath lab.  Imaging suggestive of biliary source of sepsis with cholecytitis present. Not a candidate for lap chole at that time so IR consulted with plans for perc cholecystotomy tube place 11/23 however on repeat imaging, gallbladder distension was resolved so tube placement was deferred.  Past Medical History  Myasthenia Gravis HTN Prostate cancer HLD RAS   Significant Hospital Events   11/20 admitted to [facility] with sepsis 11/21 transferred to [facility] for chest pain and EKG changes  Consults:  Cardiology gen surg IR  Procedures:  11/20 CVC 11/22 ETT >> 11/23 central IJ line placement  Significant Diagnostic Tests:  11/20 echo: EF 30-35%. Severely decreased LV function. Mild LVH. 11/21 echo: EF 20-25% with severely decreased LV function. No LVH. Diffuse hypokinesis most prominently of the anteroseptal wall and entire apex  11/20 CT c/a/p:pericholecystic edema. Left renal stone  11/21 abd [place]: thickened gallbladder wall with gallstones/sludge. Suspicious for cholecystitis. 11/23 CT a/p: interval resolution of gallbladder distension. New bilateral pleural effusions and bibasilar consolidation  11/21 CXR: worsened CHF pattern 11/22 CXR: progressively worsened pulm edema. Slight  increase in cardiomegaly 11/23 CXR s/p central line placement: improving bilateral pulm infiltrates.  11/24 CXR: pulm vasc congestion improving. Mild bibasilar airspace disease atelectasis vs infection 11/26 chest x-ray: Minimal bilateral vascular congestion. 01/22/2019: Overnight acutely unresponsive, code stroke, new cerebellar strokes, basilar artery angiography by interventional neuro.  Micro Data:  11/20 BC>> negative 11/22 BC >> ng  Antimicrobials:  merrem 11/21>> 01/21/2019 vanc 11/21>> 01/21/2019 01/21/2019 Unasyn>>11/28 Cefepime off  Interim history/subjective:   No significant change.  Currently on full mechanical ventilatory support Objective   Blood pressure (!) 78/55, pulse 95, temperature 98.8 F (37.1 C), temperature source Oral, resp. rate (!) 22, height 5\' 8"  (1.727 m), weight 82.6 kg, SpO2 100 %. CVP:  [11 mmHg-16 mmHg] 13 mmHg  Vent Mode: PSV;CPAP FiO2 (%):  [30 %] 30 % Set Rate:  [16 bmp] 16 bmp Vt Set:  [410 mL] 410 mL PEEP:  [5 cmH20] 5 cmH20 Pressure Support:  [15 cmH20] 15 cmH20 Plateau Pressure:  [14 cmH20-20 cmH20] 15 cmH20   Intake/Output Summary (Last 24 hours) at 01/29/2019 0933 Last data filed at 01/29/2019 0700 Gross per 24 hour  Intake 1431.87 ml  Output 825 ml  Net 606.87 ml   Filed Weights   01/26/19 0650 01/27/19 0344 01/29/19 0500  Weight: 80.3 kg 81.6 kg 82.6 kg    Examination: General: 83 year old male who follows commands but has agitation HEENT: Endo tracheal tube is in place Neuro: Moves all extremities follows commands intermittently CV: Heart sounds are distant PULM: Coarse rhonchi Vent pressure regulated volume control FIO2 30% PEEP 5 RATE 16 VT 410  GI: soft, bsx4 active  GU: Amber urine Extremities: warm/dry,  edema  Skin: no rashes or lesions    Resolved  Hospital Problem list   Urticaria/rash 11/27  Assessment & Plan:   Acute respiratory failure acquiring intubation and mechanical ventilation Acute  pulmonary edema, improved Plan:  Plan for one-way extubation Goal of care being comfort Morphine drip to ease transition to nonventilatory respirations  Septic shock, leukocytosis, unclear etiology, history of gram-negative bacteremia at previous hospitalization, shock state resolved Leukocytosis secondary to steroids, decreasing  Plan White count seems to be going up Repeat cultures if survives extubation    Acute cerebellar stroke Status post basilar artery angiography, no stenting EEG with diffuse encephalopathy no seizure Plan: Per neurology currently on heparin drip   Ischemic cardiac event, newly depressed ejection fraction [id number] Acute  systolic heart failure, volume overload, resolving Trops >27k. New LBBB on EKG. BNP 4100 on admission.  11/21 echo with EF of 20-25% with severe hypokinesis of anteroseptum and apex. Severe Aortic Stenosis.   Cath deferred due to stroke and AKI Plan Per cardiology  Thickened gallbladder on ultrasound examination but without significant fluid for drainage placement. P: Tube feeding as tolerated  AKI. Lab Results  Component Value Date   CREATININE 3.77 (H) 01/29/2019   CREATININE 3.04 (H) 01/28/2019   CREATININE 2.94 (H) 01/27/2019   Plan: Avoid nephrotoxins Being diuresed  Myasthenia Gravis.   Plan:  Currently on prednisone and mestinon   Hypernatremia Recent Labs  Lab 01/27/19 0452 01/28/19 0420 01/29/19 0327  NA 148* 149* 148*    Plan: Continue free water   GOC: I participated in goals of care discussion 12/2 with Wadie Lessen. NP from palliative care.. Wife 2 daughters and son present. I explained that he has improved today and has tolerated spontaneous breathing trial however due to his poor cardiac and renal situation with new strokes and deconditioning, his long-term prognosis is guarded although I am hopeful for a successful extubation. He clearly expressed that he would not want permanent tubes and long-term  mechanical ventilation. Son had a contrarian opinion and made several passive/aggressive comments but deferred to his mom for final decision making. We finally agreed to a one way extubation once his medical status is optimized and when he passes spontaneous breathing trial. If we do not reach this point, then we will rediscuss goals of care.   Best practice:  Diet: 01/22/2019 advance diet as tolerated Pain/Anxiety/Delirium protocol (if indicated): n/a DVT prophylaxis: Heparin Mobility: BR Code Status: Full once extubated changed to DNI Family Communication: 01/27/2019 family meeting with son and 2 daughters wife along with palliative care and Dr. Danne Baxter concerning goals of care.  The goal is once he has been optimized and passed spontaneous breathing trial due to one-way extubation will be performed.  01/29/2019 cardiology's input greatly appreciated.  We will continue to plan for one-way extubation goal of care being comfort. Disposition:ICU   App cct 34 min  Richardson Landry Minor ACNP Acute Care Nurse Practitioner [place] Please consult Amion 01/29/2019, 9:33 AM

## 2019-01-29 NOTE — Progress Notes (Signed)
Patient with increasing agitation throughout the day. Patient very restless and frequently moving around in the bed which has made it difficult to keep a pillow under his hip for q2h repositioning. Attempted to reposition often to protect skin. Patient noted to have skin tear on left buttock. Gerhardt's cream applied.  Joellen Jersey, RN

## 2019-01-29 NOTE — Progress Notes (Signed)
CRITICAL VALUE ALERT  Critical Value:  WBC 56.6  Date & Time Notied:  01/29/19 at [place]  Provider Notified: Elink  Orders Received/Actions taken: Will continue to monitor

## 2019-01-29 NOTE — Progress Notes (Signed)
eLink Physician-Brief Progress Note Patient Name: ZAUL CAMERA DOB: Mar 03, 1935 MRN: [id number]   Date of Service  01/29/2019  HPI/Events of Note  WBC continues to trend up. Patient currently off pressors. Afebrile.  Completed antibiotics for GNR bacteremia. Noted ongoing discussion regarding goals of care with possibility of comfort care in am  eICU Interventions  If no plans of proceeding with comfort care, consider panculture and look for possible source on infection     Intervention Category Intermediate Interventions: Other:  Judd Lien 01/29/2019, 4:52 AM

## 2019-01-29 NOTE — Progress Notes (Signed)
STROKE TEAM PROGRESS NOTE   INTERVAL HISTORY Pt wife at bedside. Pt still open eyes on voice, and still agitated with b/l arm flailing but not following simple commands today. His AKI and leukocytosis getting worse, but he has been weaning since 9am. Still on heparin drip. Plan to one-way extubate next a couple of days once appropriate.  Afebrile but diarrhea.   Vitals:   01/29/19 0530 01/29/19 0615 01/29/19 0630 01/29/19 0700  BP: (!) 87/57 (!) 95/59 119/61 96/77  Pulse: 91 96 (!) 109 98  Resp: (!) 23 17 19 19   Temp:    98.8 F (37.1 C)  TempSrc:    Oral  SpO2: 97% 97% 98% 98%  Weight:      Height:        CBC:  Recent Labs  Lab 01/23/19 0412  01/28/19 0420 01/29/19 0327  WBC 29.5*   < > 40.3* 56.6*  NEUTROABS 26.8*  --   --  51.9*  HGB 11.5*   < > 10.2* 9.5*  HCT 35.4*   < > 33.0* 30.6*  MCV 90.3   < > 93.0 93.3  PLT 150   < > 253 260   < > = values in this interval not displayed.    Basic Metabolic Panel:  Recent Labs  Lab 01/27/19 1700 01/28/19 0420 01/29/19 0327  NA  --  149* 148*  K  --  4.0 4.2  CL  --  119* 117*  CO2  --  19* 18*  GLUCOSE  --  128* 148*  BUN  --  98* 112*  CREATININE  --  3.04* 3.77*  CALCIUM  --  7.9* 7.5*  MG 2.3  --  2.2  PHOS 5.4*  --  6.0*   Lipid Panel:     Component Value Date/Time   CHOL 88 01/26/2019 0500   CHOL 171 01/27/2018 0911   TRIG 101 01/26/2019 0500   HDL 18 (L) 01/26/2019 0500   HDL 61 01/27/2018 0911   CHOLHDL 4.9 01/26/2019 0500   VLDL 20 01/26/2019 0500   LDLCALC 50 01/26/2019 0500   LDLCALC 96 01/27/2018 0911   HgbA1c:  Lab Results  Component Value Date   HGBA1C 6.2 (H) 01/24/2019   Urine Drug Screen:     Component Value Date/Time   LABOPIA NONE DETECTED 11/10/2014 1740   COCAINSCRNUR NONE DETECTED 11/10/2014 1740   LABBENZ NONE DETECTED 11/10/2014 1740   AMPHETMU NONE DETECTED 11/10/2014 1740   THCU NONE DETECTED 11/10/2014 1740   LABBARB NONE DETECTED 11/10/2014 1740    Alcohol Level      Component Value Date/Time   ETH <5 11/10/2014 1621    IMAGING past 24h No results found.   Cerebral Angio 01/12/2019 1.Occluded Lt VA at origin with no distal reconstitution. 2.Widely patent basilar artery.  EEG Abnormality - Continuous slow, generalized IMPRESSION: This study is suggestive of severe diffuse encephalopathy, nonspecific to etiology. No seizures or epileptiform discharges were seen throughout the recording.   PHYSICAL EXAM  Temp:  [98.4 F (36.9 C)-99.3 F (37.4 C)] 98.8 F (37.1 C) (12/04 0700) Pulse Rate:  [83-131] 98 (12/04 0700) Resp:  [16-32] 19 (12/04 0700) BP: (81-122)/(52-95) 96/77 (12/04 0700) SpO2:  [95 %-100 %] 98 % (12/04 0700) FiO2 (%):  [30 %] 30 % (12/04 0430) Weight:  [82.6 kg] 82.6 kg (12/04 0500)  General - Well nourished, well developed, intubated on low-dose fentanyl.  Ophthalmologic - fundi not visualized due to noncooperation.  Cardiovascular - Regular rate  and rhythm.  Neuro - intubated on low-dose fentanyl, eyes closed but easily open with voice and touch, not able to follow simple commands today. Still has intermittent bilateral upper extremity flailing with agitation, PERRL, corneal reflexes present bilaterally, gag and cough present. Breathing over the vent, on weaning trial currently.  Facial symmetry not able to test due to ET tube.  Tongue protrusion not corporative. BUE on restrain but against gravity and flailing spontaneously. BLE only mild withdraw on pain stimulation. Sensation, coordination and gait not tested.   ASSESSMENT/PLAN Mr. KAYLIN KURTIS is a 83 y.o. male with history of hypertension, hyperlipidemia, severe aortic stenosis, myasthenia gravis versus Guillain-Barr presenting to [facility] 11/20 and  transferred 11/20 to [facility] for sepsis, acute renal failure and acute respiratory failure in the setting of pulmonary edema and possible pneumonia. Found to have acute on chronic systolic heart  failure w/ EF 20%. Extubated 11/26 until 11/30 0430 when he became suddenly unresponsive.  CT head showed L & R cerebellar infarcts, concern for hyperdense basilar artery.  MRI confirmed cerebell ar stroke but MRA did not confirm basilar thrombus. Intubated. Sent to IR for diagnostic cerebral angiogram.   Acute encephalopathy  11/30 Unresponsiveness, reported disconjugate eyes, clenching jaws and flaccid extremities. Received ativan with some improvement  Etiology not clear, could be seizure with post ictal. EEG no Sz but encephalopathy  Several factors for lowered seizure threshold, including AKI, severe leukocytosis and encephalopathy  EEG no seizure but diffuse slowing  Posterior stroke is in DDx but MRA and angiogram showed patent BA, the stroke on CT and MRI was subacute instead of acute, felt not able to explain pt presentation  Continue neuro check and seizure precaution  Still encephalopathic, likely multifactorial - AKI, severe leukocytosis, respiratory failure, on sedation, cardiomyopathy, septic shock, anemia, CHF  Continue to correct metabolic derangement   Stroke:   L> R cerebellar and R temporal lobe infarcts embolic secondary to unknown source - low EF and possible AF 11/23  Code Stroke CT head L>R superior cerebellar infarcts. BA density ? Thrombus. ASPECTS 10.     MRI  L>R cerebellar infarcts. R periventricular white matter infarct medial R temporal lobe. Small vessel disease. Atrophy.   MRA L VA occlusion. B fetal PCAs. Flow present in BA.   Cerebral Angio L VA occlusion. Patent BA.   2D Echo EF 20-25%. Moderate to severe AS   EEG no sz. Severe diffuse encephalopathy  LE venous doppler no DVT  LDL 96  HgbA1c 6.2  Heparin for VTE prophylaxis  aspirin 81 mg daily prior to admission, now on triple therapy with heparin IV and aspirin 81 as well as Plavix 75.  Therapy recommendations:  SNF  Disposition:  pending   Palliative care discussion decisions:  one-way extubation followed by DNR/DNI when medically appropriate, no PEG  Possible PAF  ? AF overnight on 11/23  Now in NSR w/ PVCs. No recurrent AF  Now on IV heparin  Cardiomyopathy  11/20 EF 30 to 35%  11/21 EF 20 to 25%  Treated for pulmonary edema  On DAPT + IV heparin  Additional lasix today to optimize extubation  On heparin IV  Hypotension Hx Hypertension  Stable on the low end  Off Levophed . Avoid low BP . Long-term BP goal normotensive  AKI, worsening  Creatinine 2.07-2.3-2.29-2.59-2.94-3.04-3.77  On IV fluid  Management per primary team  Acute hypoxic respiratory failure   in the setting of pulmonary edema and possible pneumonia.  Intubated->Extubated->Reintubated  Sedated with low-dose fentanyl  Failing SBT d/t tachypena d/t pulm edema and weakness - on weaning trials again today  Failed weaning trial yesterday, no extubation today. Dr. Elsworth Soho hopeful for extubation over the next 1-2 days if can pass SBT  Septic shock Severe leukocytosis  Tapered off Levophed and neo  Leukocytosis WBC 29.5-30.8-35.1-23.5-40.3-56.6  Afebrile  Off abx now   Team consider reculturing  Hyperlipidemia  Home meds:  Fish oil and pravachol 80  Now on lipitor 80  LDL 96, goal < 70  Continue statin at discharge  Diabetes, type II, controlled  HgbA1c 6.2, goal < 7.0   SSI   CBG monitoring  Malnutrition / Dysphagia . Secondary to stroke . NPO . On TF now . Family do not feel pt would want PEG after palliative care discussion  Other Stroke Risk Factors  Advanced age  Hx Cigarette smoker  CAD w/ ischemic cardiac event, not a candidate for acute w/u  severe aortic stenosis, no longer a TAVR candidate  acute on chronic systolic heart failure w/ EF 20% d/t ischemia and sepsis. Wt up. HF team increasing lasix  Agitation and delirium  Other Active Problems  Hypokalemia  Hypernatremia   Urticaria ? R/t PCN  Cholecystitis w/ GB  distension, resolved on own  Hx myasthenia gravis versus Guillain-Barr - on Mestinon and prednisone  anemia 10.0-9.9-9.3-9.5  New LBBB, ischemia suspected  Acute delirium  Hospital day # 14  This patient is critically ill due to acute encephalopathy, stroke, possible seizure, cardiomyopathy, hypotension, sepsis and at significant risk of neurological worsening, death form recurrent stroke, status epilepticus, septic shock, cardiogenic shock, heart failure. This patient's care requires constant monitoring of vital signs, hemodynamics, respiratory and cardiac monitoring, review of multiple databases, neurological assessment, discussion with family, other specialists and medical decision making of high complexity. I spent 35 minutes of neurocritical care time in the care of this patient. I had long discussion with wife at bedside, updated pt current condition, treatment plan and potential prognosis, and answered all the questions. She expressed understanding and appreciation.   Neurology has nothing to add at this time. Neurology will sign off. Please call with questions. Pt will follow up with stroke clinic 4 weeks after discharge. Thanks for the consult.   Rosalin Hawking, MD PhD Stroke Neurology 01/29/2019 8:16 AM    To contact Stroke Continuity provider, please refer to [email or web address] After hours, contact General Neurology

## 2019-01-29 NOTE — Progress Notes (Signed)
[place] for Heparin Indication: stroke  Allergies  Allergen Reactions  . Penicillins Rash    Noted after change from Unasyn to Zosyn 01/22/19    Patient Measurements: Height: 5\' 8"  (172.7 cm) Weight: 179 lb 14.3 oz (81.6 kg) IBW/kg (Calculated) : 68.4  Vital Signs: Temp: 98.4 F (36.9 C) (12/03 1549) Temp Source: Oral (12/03 1549) BP: 95/61 (12/04 0000) Pulse Rate: 93 (12/04 0000)  Labs: Recent Labs    01/26/19 0500 01/27/19 0452 01/28/19 0058 01/28/19 0420 01/28/19 1036 01/28/19 2302  HGB  --  9.3*  --  10.2*  --   --   HCT  --  30.1*  --  33.0*  --   --   PLT  --  241  --  253  --   --   HEPARINUNFRC  --   --  1.08*  --  1.16* 0.90*  CREATININE 2.59* 2.94*  --  3.04*  --   --     Estimated Creatinine Clearance: 18.4 mL/min (A) (by C-G formula based on SCr of 3.04 mg/dL (H)).  Assessment: 83 yo m originally presented with sepsis, now concern for embolic CVA  CBC stable  12/4 AM update:  Heparin level remains elevated Heparin level drawn centrally and running peripherally  No issues per RN  Goal of Therapy:  Heparin level 0.3 - 0.5 units/ml Monitor platelets by anticoagulation protocol: Yes   Plan:  Dec heparin to 400 units/hr Re-check heparin level at [place], PharmD, [place] Pharmacist Phone: [phone]

## 2019-01-29 NOTE — Progress Notes (Signed)
SLP Cancellation Note  Patient Details Name: OMAN MOSEY MRN: [id number] DOB: 1935-09-08   Cancelled treatment:  Pt remains intubated with plans for potential one-way extubation.  Our service will respectfully sign off at this time.  Please re-order should status improve.        Vaneza Pickart L. Tivis Ringer, [place] CCC/SLP Acute Rehabilitation Services Office number [phone] Pager [phone]  Juan Quam Laurice 01/29/2019, 3:17 PM

## 2019-01-29 NOTE — Progress Notes (Signed)
Progress Note  Patient Name: Duane Burns Date of Encounter: 01/29/2019  Primary Cardiologist: Kate Sable, MD   Subjective   Remains intubated. Agitated. Failed vent wean again yesterday     Family meeting yesterday - continue aggressive care but he would not want long-term mechanical support. Plan 1-way extubation if he can pass SBT   On heparin. Off pressors. Co-ox 64% Creatinine up to 3.77. Given IV lasix with minimal response yesterday   WBC continues to rise now 56K     Inpatient Medications    Scheduled Meds: . aspirin  81 mg Per Tube Daily  . atorvastatin  80 mg Per Tube q1800  . chlorhexidine gluconate (MEDLINE KIT)  15 mL Mouth Rinse BID  . chlorhexidine gluconate (MEDLINE KIT)  15 mL Mouth Rinse BID  . Chlorhexidine Gluconate Cloth  6 each Topical Daily  . cholecalciferol  1,000 Units Per Tube Daily  . clopidogrel  75 mg Per Tube Daily  . sennosides  5 mL Per Tube BID   And  . docusate  50 mg Per Tube BID  . feeding supplement (PRO-STAT SUGAR FREE 64)  30 mL Per Tube BID  . furosemide  80 mg Intravenous BID  . Gerhardt's butt cream   Topical BID  . mouth rinse  15 mL Mouth Rinse 10 times per day  . predniSONE  10 mg Per Tube Q breakfast  . pyridostigmine  60 mg Per Tube TID  . sodium chloride flush  10-40 mL Intracatheter Q12H  . sodium chloride flush  3 mL Intravenous Q12H   Continuous Infusions: . sodium chloride 250 mL (01/28/19 2243)  . dexmedetomidine (PRECEDEX) IV infusion Stopped (01/26/19 1125)  . famotidine (PEPCID) IV 20 mg (01/28/19 2246)  . feeding supplement (VITAL AF 1.2 CAL) 1,000 mL (01/29/19 0223)  . fentaNYL infusion INTRAVENOUS 75 mcg/hr (01/29/19 0700)  . heparin 400 Units/hr (01/29/19 0700)   PRN Meds: sodium chloride, acetaminophen **OR** acetaminophen, bisacodyl, diphenhydrAMINE, fentaNYL (SUBLIMAZE) injection, guaiFENesin, haloperidol lactate, levalbuterol, midazolam, ondansetron **OR** ondansetron (ZOFRAN) IV,  polyethylene glycol, sennosides, sodium chloride flush, sodium chloride flush, traZODone   Vital Signs    Vitals:   01/29/19 0530 01/29/19 0615 01/29/19 0630 01/29/19 0700  BP: (!) 87/57 (!) 95/59 119/61 96/77  Pulse: 91 96 (!) 109 98  Resp: (!) 23 17 19 19   Temp:      TempSrc:      SpO2: 97% 97% 98% 98%  Weight:      Height:        Intake/Output Summary (Last 24 hours) at 01/29/2019 0720 Last data filed at 01/29/2019 0700 Gross per 24 hour  Intake 1598.83 ml  Output 825 ml  Net 773.83 ml   Filed Weights   01/26/19 0650 01/27/19 0344 01/29/19 0500  Weight: 80.3 kg 81.6 kg 82.6 kg    Telemetry    Sinus tach 90-110 Personally reviewed   Physical Exam   PHYSICAL EXAM: General:  Critically ill appearing WM, awake on vent. agitated HEENT: normal +ETT Cor: PMI nondisplaced. Regular tachy 2/6 AS Lungs: coarse Abdomen: soft, nontender, nondistended. No hepatosplenomegaly. No bruits or masses. Good bowel sounds. Extremities: no cyanosis, clubbing, rash, tr edema Neuro: awake agitated on vent   Labs    Chemistry Recent Labs  Lab 01/27/19 0452 01/28/19 0420 01/29/19 0327  NA 148* 149* 148*  K 4.0 4.0 4.2  CL 115* 119* 117*  CO2 19* 19* 18*  GLUCOSE 123* 128* 148*  BUN 83* 98* 112*  CREATININE 2.94* 3.04* 3.77*  CALCIUM 7.8* 7.9* 7.5*  PROT 4.6* 5.0* 4.6*  ALBUMIN 1.8* 1.7* 1.4*  AST 42* 55* 49*  ALT 23 27 28   ALKPHOS 133* 166* 157*  BILITOT 1.2 1.3* 0.7  GFRNONAA 19* 18* 14*  GFRAA 22* 21* 16*  ANIONGAP 14 11 13      Hematology Recent Labs  Lab 01/27/19 0452 01/28/19 0420 01/29/19 0327  WBC 23.5* 40.3* 56.6*  RBC 3.21* 3.55* 3.28*  HGB 9.3* 10.2* 9.5*  HCT 30.1* 33.0* 30.6*  MCV 93.8 93.0 93.3  MCH 29.0 28.7 29.0  MCHC 30.9 30.9 31.0  RDW 14.9 15.3 15.7*  PLT 241 253 260    Cardiac EnzymesNo results for input(s): TROPONINI in the last 168 hours. No results for input(s): TROPIPOC in the last 168 hours.   BNP No results for input(s): BNP,  PROBNP in the last 168 hours.   DDimer No results for input(s): DDIMER in the last 168 hours.   Radiology    No results found.  Cardiac Studies   Echo 11/21  1. Left ventricular ejection fraction, by visual estimation, is 20 to 25%. The left ventricle has severely decreased function. There is no left ventricular hypertrophy.  2. Diffuse hypokinesis with most prominent severe hypokinesis of the anteroseptal wall and the entire apex.  3. Global right ventricle was not assessed.The right ventricular size is not assessed. Right vetricular wall thickness was not assessed.  4. Left atrial size was not assessed.  5. Right atrial size was not assessed.  6. The pericardium was not assessed.  7. The mitral valve was not assessed. not assessed mitral valve regurgitation.  8. The tricuspid valve is not assessed. Tricuspid valve regurgitation not assessed.  9. Aortic valve regurgitation Not assessed. 10. The aortic valve was not assessed. Aortic valve regurgitation Not assessed. 11. The pulmonic valve was not assessed. Pulmonic valve regurgitation Not assessed. 12. Not assessed. 13. The interatrial septum was not assessed.  ECHO 11/20 12/2018 LEFT VENTRICLE PLAX 2D LVIDd: 4.17 cm LVIDs: 2.50 cm LV PW: 1.26 cm LV IVS: 1.30 cm LVOT diam: 1.80 cm LV SV: 55 ml LV SV Index: 26.59 LVOT Area: 2.54 cm  06/2018 +------------------+------------++ AORTIC VALVE   +------------------+------------++ AV Area (Vmax): 0.88 cm  +------------------+------------++ AV Area (Vmean): 0.89 cm  +------------------+------------++ AV Area (VTI): 0.90 cm  +------------------+------------++ AV Vmax: 372.81 cm/s  +------------------+------------++ AV Vmean: 255.916 cm/s +------------------+------------++ AV VTI: 0.923 m  +------------------+------------++ AV Peak  Grad: 55.6 mmHg  +------------------+------------++ AV Mean Grad: 30.9 mmHg  +------------------+------------++ LVOT Vmax: 86.15 cm/s  +------------------+------------++ LVOT Vmean: 60.100 cm/s  +------------------+------------++ LVOT VTI: 0.220 m  +------------------+------------++ LVOT/AV VTI ratio:0.24  +------------------+------------++ AR PHT: 779 msec  +------------------+------------++    Patient Profile     Makya Yurko is a 83yo male with PMH HTN, moderate to severe AS, myasthenia gravis, HLD, recent e. Coli bacteremia treated at [facility] (10/19-10/29) presenting with sepsis secondary to acute cholecystitis with acute HF and severe aortic stenosis.   Assessment & Plan    1. Shock - combined sepsis/cardiogenic  - Recent GN sepsis last month. - Readmitted with recurrent sepsis  PCT very high (67 11/23) - Initial sepsis felt to be cholecystitis but now felt less likely - Meropenem switched to Unasyn on 11/26. (vanc stopped). Switched to zosyn due to salt load. Developed rash. (likely PCN related) Completed cefipime. Cx negative.  - WBC climbing back up now 56K  Tmax 99.6 - Consider reculturing  2. Acute Systolic HF -> cardiogenic  shock - Echo newly down to 20-25% with severe AS from 60-65% with mod-severe AS on 06/2018. - hs Trop > 27K  - suspect combination of ischemic and septic CM - cath deferred due to stroke and AKI.  - Continues with worsening renal function without any response to IV lasix. Will increase lasix to 160 bid - Weight up 13 pounds from previous. Will give IV lasix again to try facilitate chances for successful extubation  3. Acute hypoxic respiratory failure - due to HF and sepsis - extubated 11/26 > retintubated 11/30 2/2 acute CVA with severe dysarthria. - failing SBT due to tachypnea. Liked related to pulmoanry edema and weakness +/- recurrent sepsis   - CCM  managing -> plan as above  4. Severe Aortic Stenosis - Worsened AS since May of this year.  - ideally would plan TAVR but likely no longer a candidate  5. Acute cerebellar/temporal stroke - reintubated for airway protection - Stat CT showed acute subacute cerebellar infarcts L>R and possible basilar artery thrombus - stat MRI/MRA showed infarct to right temporal lobe, high grade stenosis non-dominant left vertebral artery but unclear if thrombus present in basilar artery  - taken for angiogram but not given tpa - Neurology following.  - now on heparin   6. AKI - due to shock/ATN - baseline creatinine 1.6. continues to rise, now 2.94-> 3.04 -> 3.77   7. Myasthenia gravis  - weaned off stress-dose steroids. Now back on home dose prednisone 57m daily  - watch for precipitation of MG crisis    8. PAF - had ? AF overnight on 11/23 - now in NSR with frequent PVCs. Afib has not recurred  - Heparin stopped, on LMWH for DVT prophylaxis.  - Now back on heparin. No obvious bleeding. Discussed dosing with PharmD personally.  9. New LBBB - suspect ischemic  10. Hypernatremia -  Can give free water  - Na 148 today  11. Acute delirium  - CCM managing  This is an unsurvivable situation due to diffuse MSOF with problems that are likely not amenable to cure. I don't see any other route except comfort care at this point.   CRITICAL CARE Performed by: BGlori Bickers Total critical care time: 40 minutes  Critical care time was exclusive of separately billable procedures and treating other patients.  Critical care was necessary to treat or prevent imminent or life-threatening deterioration.  Critical care was time spent personally by me (independent of midlevel providers or residents) on the following activities: development of treatment plan with patient and/or surrogate as well as nursing, discussions with consultants, evaluation of patient's response to treatment, examination of  patient, obtaining history from patient or surrogate, ordering and performing treatments and interventions, ordering and review of laboratory studies, ordering and review of radiographic studies, pulse oximetry and re-evaluation of patient's condition.     Signed, DGlori Bickers MD  01/29/2019, 7:20 AM    7:20 AM

## 2019-01-29 NOTE — Progress Notes (Signed)
PT Cancellation Note  Patient Details Name: Duane Burns MRN: [id number] DOB: November 09, 1935   Cancelled Treatment:    Reason Eval/Treat Not Completed: Other (comment) PT held due to pt agitation and level of arousal. Pt is on full ventilator support and Palliative Medicine Team following. PT will continue to follow and tx per POC.   Earney Navy, PTA Acute Rehabilitation Services Pager: [phone] Office: [phone]   01/29/2019, 2:42 PM

## 2019-01-29 NOTE — Progress Notes (Signed)
During 6pm rounds RN noted condom cath had come off patient. Urine in the catheter was noted to be very thick and cloudy. Also bladder scanned patient for 241 mls. Dr. Elsworth Soho notified of the above. Urine culture ordered. Stated to continue to bladder scan and perform I&O as needed per protocol, do not place foley catheter.  Joellen Jersey, RN

## 2019-01-30 ENCOUNTER — Inpatient Hospital Stay (HOSPITAL_COMMUNITY): Payer: Medicare HMO

## 2019-01-30 DIAGNOSIS — K661 Hemoperitoneum: Secondary | ICD-10-CM

## 2019-01-30 DIAGNOSIS — D62 Acute posthemorrhagic anemia: Secondary | ICD-10-CM

## 2019-01-30 LAB — BLOOD CULTURE ID PANEL (REFLEXED)
Acinetobacter baumannii: NOT DETECTED
Candida albicans: NOT DETECTED
Candida glabrata: DETECTED — AB
Candida krusei: NOT DETECTED
Candida parapsilosis: NOT DETECTED
Candida tropicalis: NOT DETECTED
Enterobacter cloacae complex: NOT DETECTED
Enterobacteriaceae species: NOT DETECTED
Enterococcus species: NOT DETECTED
Escherichia coli: NOT DETECTED
Haemophilus influenzae: NOT DETECTED
Klebsiella oxytoca: NOT DETECTED
Klebsiella pneumoniae: NOT DETECTED
Listeria monocytogenes: NOT DETECTED
Neisseria meningitidis: NOT DETECTED
Proteus species: NOT DETECTED
Pseudomonas aeruginosa: NOT DETECTED
Serratia marcescens: NOT DETECTED
Staphylococcus aureus (BCID): NOT DETECTED
Staphylococcus species: NOT DETECTED
Streptococcus agalactiae: NOT DETECTED
Streptococcus pneumoniae: NOT DETECTED
Streptococcus pyogenes: NOT DETECTED
Streptococcus species: NOT DETECTED

## 2019-01-30 LAB — CBC
HCT: 26.1 % — ABNORMAL LOW (ref 39.0–52.0)
Hemoglobin: 8.2 g/dL — ABNORMAL LOW (ref 13.0–17.0)
MCH: 29.6 pg (ref 26.0–34.0)
MCHC: 31.4 g/dL (ref 30.0–36.0)
MCV: 94.2 fL (ref 80.0–100.0)
Platelets: 320 10*3/uL (ref 150–400)
RBC: 2.77 MIL/uL — ABNORMAL LOW (ref 4.22–5.81)
RDW: 16.2 % — ABNORMAL HIGH (ref 11.5–15.5)
WBC: 54.1 10*3/uL (ref 4.0–10.5)
nRBC: 0 % (ref 0.0–0.2)

## 2019-01-30 LAB — COMPREHENSIVE METABOLIC PANEL
ALT: 553 U/L — ABNORMAL HIGH (ref 0–44)
AST: 1239 U/L — ABNORMAL HIGH (ref 15–41)
Albumin: 1.4 g/dL — ABNORMAL LOW (ref 3.5–5.0)
Alkaline Phosphatase: 202 U/L — ABNORMAL HIGH (ref 38–126)
Anion gap: 14 (ref 5–15)
BUN: 135 mg/dL — ABNORMAL HIGH (ref 8–23)
CO2: 16 mmol/L — ABNORMAL LOW (ref 22–32)
Calcium: 8 mg/dL — ABNORMAL LOW (ref 8.9–10.3)
Chloride: 116 mmol/L — ABNORMAL HIGH (ref 98–111)
Creatinine, Ser: 4.83 mg/dL — ABNORMAL HIGH (ref 0.61–1.24)
GFR calc Af Amer: 12 mL/min — ABNORMAL LOW (ref >60)
GFR calc non Af Amer: 10 mL/min — ABNORMAL LOW (ref >60)
Glucose, Bld: 140 mg/dL — ABNORMAL HIGH (ref 70–99)
Potassium: 4.4 mmol/L (ref 3.5–5.1)
Sodium: 146 mmol/L — ABNORMAL HIGH (ref 135–145)
Total Bilirubin: 1 mg/dL (ref 0.3–1.2)
Total Protein: 4.8 g/dL — ABNORMAL LOW (ref 6.5–8.1)

## 2019-01-30 LAB — LIPASE, BLOOD: Lipase: 127 U/L — ABNORMAL HIGH (ref 11–51)

## 2019-01-30 LAB — GLUCOSE, CAPILLARY
Glucose-Capillary: 117 mg/dL — ABNORMAL HIGH (ref 70–99)
Glucose-Capillary: 138 mg/dL — ABNORMAL HIGH (ref 70–99)
Glucose-Capillary: 141 mg/dL — ABNORMAL HIGH (ref 70–99)
Glucose-Capillary: 146 mg/dL — ABNORMAL HIGH (ref 70–99)

## 2019-01-30 LAB — PROCALCITONIN: Procalcitonin: 8.88 ng/mL

## 2019-01-30 LAB — MAGNESIUM: Magnesium: 2.2 mg/dL (ref 1.7–2.4)

## 2019-01-30 LAB — COOXEMETRY PANEL
Carboxyhemoglobin: 0.8 % (ref 0.5–1.5)
Methemoglobin: 0.8 % (ref 0.0–1.5)
O2 Saturation: 68.4 %
Total hemoglobin: 8.2 g/dL — ABNORMAL LOW (ref 12.0–16.0)

## 2019-01-30 LAB — HEPARIN LEVEL (UNFRACTIONATED): Heparin Unfractionated: 0.22 IU/mL — ABNORMAL LOW (ref 0.30–0.70)

## 2019-01-30 LAB — PHOSPHORUS: Phosphorus: 8 mg/dL — ABNORMAL HIGH (ref 2.5–4.6)

## 2019-01-30 MED ORDER — MORPHINE SULFATE (PF) 2 MG/ML IV SOLN: 2.0000 mg | INTRAVENOUS | Status: DC | PRN

## 2019-01-30 MED ORDER — SODIUM CHLORIDE 0.9 % IV BOLUS
500.0000 mL | Freq: Once | INTRAVENOUS | Status: AC
  Administered 2019-01-30: 500 mL via INTRAVENOUS

## 2019-01-30 MED ORDER — SODIUM CHLORIDE 0.9 % IV SOLN
250.0000 mL | INTRAVENOUS | Status: DC
  Administered 2019-01-30: 250 mL via INTRAVENOUS

## 2019-01-30 MED ORDER — MORPHINE BOLUS VIA INFUSION
5.0000 mg | INTRAVENOUS | Status: DC | PRN
  Administered 2019-01-30 (×3): 5 mg via INTRAVENOUS
  Filled 2019-01-30: qty 5

## 2019-01-30 MED ORDER — DIPHENHYDRAMINE HCL 50 MG/ML IJ SOLN: 25.0000 mg | INTRAMUSCULAR | Status: DC | PRN

## 2019-01-30 MED ORDER — LACTATED RINGERS IV BOLUS
500.0000 mL | Freq: Once | INTRAVENOUS | Status: AC
  Administered 2019-01-30: 500 mL via INTRAVENOUS

## 2019-01-30 MED ORDER — MORPHINE 100MG IN NS 100ML (1MG/ML) PREMIX INFUSION
0.0000 mg/h | INTRAVENOUS | Status: DC
  Administered 2019-01-30: 20 mg/h via INTRAVENOUS
  Administered 2019-01-30: 5 mg/h via INTRAVENOUS
  Administered 2019-01-30 – 2019-01-31 (×4): 20 mg/h via INTRAVENOUS
  Filled 2019-01-30 (×7): qty 100

## 2019-01-30 MED ORDER — NOREPINEPHRINE 16 MG/250ML-% IV SOLN
0.0000 ug/min | INTRAVENOUS | Status: DC
  Administered 2019-01-30: 7 ug/min via INTRAVENOUS
  Filled 2019-01-30: qty 250

## 2019-01-30 MED ORDER — GLYCOPYRROLATE 0.2 MG/ML IJ SOLN: 0.2000 mg | INTRAMUSCULAR | Status: DC | PRN

## 2019-01-30 MED ORDER — SODIUM CHLORIDE 0.9 % IV SOLN: 100.0000 mg | INTRAVENOUS | Status: DC

## 2019-01-30 MED ORDER — GLYCOPYRROLATE 1 MG PO TABS
1.0000 mg | ORAL_TABLET | ORAL | Status: DC | PRN
  Filled 2019-01-30: qty 1

## 2019-01-30 MED ORDER — GLYCOPYRROLATE 0.2 MG/ML IJ SOLN
0.2000 mg | INTRAMUSCULAR | Status: DC | PRN
  Administered 2019-01-30: 0.2 mg via INTRAVENOUS
  Filled 2019-01-30 (×2): qty 1

## 2019-01-30 MED ORDER — POLYVINYL ALCOHOL 1.4 % OP SOLN
1.0000 [drp] | Freq: Four times a day (QID) | OPHTHALMIC | Status: DC | PRN
  Filled 2019-01-30: qty 15

## 2019-01-30 MED ORDER — NOREPINEPHRINE 4 MG/250ML-% IV SOLN
2.0000 ug/min | INTRAVENOUS | Status: DC
  Administered 2019-01-30: 2 ug/min via INTRAVENOUS
  Filled 2019-01-30: qty 250

## 2019-01-30 MED ORDER — LORAZEPAM 2 MG/ML IJ SOLN
2.0000 mg | INTRAMUSCULAR | Status: DC | PRN
  Administered 2019-01-30: 2 mg via INTRAVENOUS
  Filled 2019-01-30: qty 1

## 2019-01-30 NOTE — Progress Notes (Signed)
Pt extubated at this time per MD order & family wishes. Pt now full comfort care. Family at bedside

## 2019-01-30 NOTE — Progress Notes (Signed)
Elevated liver enzymes called in to Dr. Genevive Bi. Patient BP not at goal and requiring increase titration of Levophed. MD notified and 500cc of NS order given. Also MD notified about R. Lower abdomen bruise which was assessed at the beginning of the shift. Bruise was marked and has not changed or increased out of borders of markings. MD ordered lipase which was drawn and sent down to lab; awaiting results.

## 2019-01-30 NOTE — Progress Notes (Signed)
Pt transported to CT on vent no complications

## 2019-01-30 NOTE — Progress Notes (Signed)
eLink Physician-Brief Progress Note Patient Name: KINTA ASTER DOB: [birth year] MRN: [id number]   Date of Service  01/30/2019  HPI/Events of Note  Notified of hypotension SBP 70s, HR 90s. Patient restarted on cefepime today after cultures drawn.  eICU Interventions  Will give a trail of 500 cc LR bolus and start norepinephrine if BP remains low     Intervention Category Major Interventions: Shock - evaluation and management;Sepsis - evaluation and management  Judd Lien 01/30/2019, 1:21 AM

## 2019-01-30 NOTE — Progress Notes (Signed)
Patient with hemoperitoneum now.   D/w CCM. Situation remains unsurvivable due to worsening MSOF.  Moving toward comfort care.   HF/shock team will s/o.   Please call if we can be of any support.   Glori Bickers, MD  11:06 AM

## 2019-01-30 NOTE — Plan of Care (Signed)
  Problem: Clinical Measurements: Goal: Will remain free from infection Outcome: Progressing   Problem: Clinical Measurements: Goal: Respiratory complications will improve Outcome: Progressing   Problem: Coping: Goal: Level of anxiety will decrease Outcome: Progressing   Problem: Elimination: Goal: Will not experience complications related to urinary retention Outcome: Progressing   Problem: Intracerebral Hemorrhage Tissue Perfusion: Goal: Complications of Intracerebral Hemorrhage will be minimized Outcome: Progressing   Problem: Ischemic Stroke/TIA Tissue Perfusion: Goal: Complications of ischemic stroke/TIA will be minimized Outcome: Progressing

## 2019-01-30 NOTE — Progress Notes (Signed)
Nutrition Brief Note  Chart reviewed. Pt now transitioning to comfort care.  No further nutrition interventions warranted at this time.  Please re-consult as needed.   Shemar Plemmons RD, LDN Clinical Nutrition Pager # - [phone]    

## 2019-01-30 NOTE — Progress Notes (Signed)
NAME:  RENNICK AWADALLAH, MRN:  [id number], DOB:  1935-10-06, LOS: 108 ADMISSION DATE:  01/23/2019,  CHIEF COMPLAINT:  Chest pain  Brief History   83 yo male who initally presented to [facility] with findings consistent with sepsis. Recent e. Coli bacteremia treated at [facility] (10/19-10/29). While at [facility], patient developed chest pain with multiple PACs and PVCs and was subsequently transferred to [facility] for further evaluation and treatment. On arrival, trops>27k. Lactate 3.7. white count 37k. No ST elevation on initial EKG however did develop new LBBB on later EKG. Cardiology consulted and spoke with intervential cardiology. Given septic state, renal failure, was not candidate for cath lab.  Imaging suggestive of biliary source of sepsis with cholecytitis present. Not a candidate for lap chole at that time so IR consulted with plans for perc cholecystotomy tube place 11/23 however on repeat imaging, gallbladder distension was resolved so tube placement was deferred.  Past Medical History  Myasthenia Gravis HTN Prostate cancer HLD RAS   Significant Hospital Events   11/20 admitted to [facility] with sepsis 11/21 transferred to [facility] for chest pain and EKG changes  Consults:  Cardiology gen surg IR  Procedures:  11/20 CVC 11/22 ETT >> 11/23 central IJ line placement  Significant Diagnostic Tests:  11/20 echo: EF 30-35%. Severely decreased LV function. Mild LVH. 11/21 echo: EF 20-25% with severely decreased LV function. No LVH. Diffuse hypokinesis most prominently of the anteroseptal wall and entire apex  11/20 CT c/a/p:pericholecystic edema. Left renal stone  11/21 abd [place]: thickened gallbladder wall with gallstones/sludge. Suspicious for cholecystitis. 11/23 CT a/p: interval resolution of gallbladder distension. New bilateral pleural effusions and bibasilar consolidation  11/21 CXR: worsened CHF pattern 11/22 CXR: progressively worsened pulm edema. Slight  increase in cardiomegaly 11/23 CXR s/p central line placement: improving bilateral pulm infiltrates.  11/24 CXR: pulm vasc congestion improving. Mild bibasilar airspace disease atelectasis vs infection 11/26 chest x-ray: Minimal bilateral vascular congestion. 01/02/2019: Overnight acutely unresponsive, code stroke, new cerebellar strokes, basilar artery angiography by interventional neuro.  Micro Data:  11/20 BC>> negative 11/22 BC >> ng  Antimicrobials:  merrem 11/21>> 01/21/2019 vanc 11/21>> 01/21/2019 01/21/2019 Unasyn>>11/28 Cefepime off  Interim history/subjective:   Overnight, had drop in hemoglobin.  Became hypotensive.  LFTs increased.  CT abdomen this morning demonstrated massive hemoperitoneum, and multiple hematomas. Objective   Blood pressure 98/80, pulse 99, temperature (!) 97.5 F (36.4 C), temperature source Oral, resp. rate 17, height 5\' 8"  (1.727 m), weight 84.9 kg, SpO2 100 %.    Vent Mode: PSV;CPAP FiO2 (%):  [30 %] 30 % Set Rate:  [16 bmp] 16 bmp Vt Set:  [410 mL] 410 mL PEEP:  [5 cmH20] 5 cmH20 Pressure Support:  [8 cmH20-10 cmH20] 8 cmH20 Plateau Pressure:  [14 cmH20-19 cmH20] 19 cmH20   Intake/Output Summary (Last 24 hours) at 01/30/2019 1435 Last data filed at 01/30/2019 1400 Gross per 24 hour  Intake 2898.64 ml  Output 780 ml  Net 2118.64 ml   Filed Weights   01/27/19 0344 01/29/19 0500 01/30/19 0500  Weight: 81.6 kg 82.6 kg 84.9 kg    Examination: General: Intubated, agitated, not following commands HEENT: Endo tracheal tube is in place Neuro: Moves all extremities peers delirious and uncomfortable CV: Heart sounds are distant PULM: Coarse rhonchi GI: soft, diffusely tender, full active bowel sounds GU: Amber urine Extremities: warm/dry, edema  Skin: no rashes or lesions   Assessment & Plan:   Acute respiratory failure acquiring  intubation and mechanical ventilation Acute pulmonary edema, improved Plan:  Plan for one-way extubation  Goal of care being comfort Morphine drip to ease transition to nonventilatory respirations Comfort measures ordered.  Septic shock, leukocytosis, unclear etiology, history of gram-negative bacteremia at previous hospitalization, shock state resolved Leukocytosis secondary to steroids, decreasing  Plan Likely secondary to multiorgan failure stress response.  Acute blood loss anemia Massive hemoperitoneum secondary to heparin infusion.  Will stop heparin infusion   Acute cerebellar stroke Status post basilar artery angiography, no stenting EEG with diffuse encephalopathy no seizure Plan: Heparin drip discontinued, due to massive hemoperitoneum.   Ischemic cardiac event, newly depressed ejection fraction [id number] Acute  systolic heart failure, volume overload, resolving Trops >27k. New LBBB on EKG. BNP 4100 on admission.  11/21 echo with EF of 20-25% with severe hypokinesis of anteroseptum and apex. Severe Aortic Stenosis.   Cath deferred due to stroke and AKI Plan Gust with heart failure team.  Clearly he is too sick for any kind of valve repair, heart catheterization, and is unable to tolerate anticoagulation.  No advanced therapies to offer.   AKI. Plan: Avoid nephrotoxins Being diuresed  Myasthenia Gravis.   Plan:  Currently on prednisone and mestinon   Hypernatremia  Plan: Continue free water  Goals of care: Family meeting today with patient's wife and daughter.  Discussed with them the catastrophic events of overnight.  They feel that he has been suffering, and would like to transition to comfort measures which is medically appropriate.  Plan will be to transition to comfort measures with additional family members can arrive.   The patient is critically ill with multiple organ systems failure and requires high complexity decision making for assessment and support, frequent evaluation and titration of therapies, application of advanced monitoring technologies and  extensive interpretation of multiple databases.   Critical Care Time devoted to patient care services described in this note is 78 minutes. This time reflects time of care of this [place] . This critical care time does not reflect separately billable procedures or procedure time, teaching time or supervisory time of PA/NP/Med student/Med Resident etc but could involve care discussion time.  [facility] 01/30/2019 2:37 PM  Pager: [phone] After hours pager: [phone]

## 2019-01-30 NOTE — Progress Notes (Signed)
Patient found to have candidemia. Since he is comfort care, we will not initiate anidulafungin. Our condolences to the family.  Elzie Rings [place] [facility] [phone]

## 2019-01-30 NOTE — Progress Notes (Signed)
Patient experiencing hypotension with SBP 70-80s and MAP in the 50s. CCM called and notified of change. New orders for 500cc LR bolus and levo carried out as ordered by MD with MAP goal of 65 and greater. Will continue to monitor patient.

## 2019-01-30 NOTE — Progress Notes (Signed)
Patient is now Full comfort measures-DNR. Family at bedside.

## 2019-01-30 NOTE — Progress Notes (Signed)
PHARMACY - PHYSICIAN COMMUNICATION CRITICAL VALUE ALERT - BLOOD CULTURE IDENTIFICATION (BCID)  Duane Burns is an 83 y.o. male  with VDRF and shock.Plans noted for comfort care. ID also saw today.   Assessment: Candidemia (candida glabrata)   Current antibiotics: cefepime  Changes to prescribed antibiotics recommended:  No changes recommended  Results for orders placed or performed during the hospital encounter of 01/09/2019  Blood Culture ID Panel (Reflexed) (Collected: 01/29/2019 12:15 PM)  Result Value Ref Range   Enterococcus species NOT DETECTED NOT DETECTED   Listeria monocytogenes NOT DETECTED NOT DETECTED   Staphylococcus species NOT DETECTED NOT DETECTED   Staphylococcus aureus (BCID) NOT DETECTED NOT DETECTED   Streptococcus species NOT DETECTED NOT DETECTED   Streptococcus agalactiae NOT DETECTED NOT DETECTED   Streptococcus pneumoniae NOT DETECTED NOT DETECTED   Streptococcus pyogenes NOT DETECTED NOT DETECTED   Acinetobacter baumannii NOT DETECTED NOT DETECTED   Enterobacteriaceae species NOT DETECTED NOT DETECTED   Enterobacter cloacae complex NOT DETECTED NOT DETECTED   Escherichia coli NOT DETECTED NOT DETECTED   Klebsiella oxytoca NOT DETECTED NOT DETECTED   Klebsiella pneumoniae NOT DETECTED NOT DETECTED   Proteus species NOT DETECTED NOT DETECTED   Serratia marcescens NOT DETECTED NOT DETECTED   Haemophilus influenzae NOT DETECTED NOT DETECTED   Neisseria meningitidis NOT DETECTED NOT DETECTED   Pseudomonas aeruginosa NOT DETECTED NOT DETECTED   Candida albicans NOT DETECTED NOT DETECTED   Candida glabrata DETECTED (A) NOT DETECTED   Candida krusei NOT DETECTED NOT DETECTED   Candida parapsilosis NOT DETECTED NOT DETECTED   Candida tropicalis NOT DETECTED NOT DETECTED   Hildred Laser, PharmD Clinical Pharmacist **Pharmacist phone directory can now be found on [email or web address] ([email or web address]).  Listed under [place].

## 2019-01-30 NOTE — Progress Notes (Addendum)
[facility] Progress Note Patient Name: Duane Burns DOB: November 17, 1935 MRN: [id number]   Date of Service  01/30/2019  HPI/Events of Note  Notified of significantly elevated live enzymes. On further review, continued increase in pressor requirement, likely shock liver.  eICU Interventions  Ordered another 500 cc NS bolus.  Update: Informed of an abdominal hematoma. Will add lipase. May need to image abdomen.     Intervention Category Major Interventions: Other:;Hypotension - evaluation and management  Judd Lien 01/30/2019, 6:42 AM

## 2019-01-31 LAB — URINE CULTURE: Culture: 60000 — AB

## 2019-02-01 LAB — CULTURE, RESPIRATORY W GRAM STAIN

## 2019-02-01 LAB — CULTURE, BLOOD (ROUTINE X 2)
Special Requests: ADEQUATE
Special Requests: ADEQUATE

## 2019-02-26 NOTE — Death Summary Note (Signed)
DEATH SUMMARY   Patient Details  Name: Duane Burns MRN: [id number] DOB: [birth year]  Admission/Discharge Information   Admit Date:  05-Feb-2019  Date of Death: Date of Death: Feb 21, 2019  Time of Death: Time of Death: Jun 10, 1350  Length of Stay: 2022-06-01  Referring Physician: [name], DO   Reason(s) for Hospitalization  NSTEMI  Diagnoses  Preliminary cause of death:  Secondary Diagnoses (including complications and co-morbidities):  Principal Problem:   Sepsis ([place]) Active Problems:   Essential hypertension   Myasthenia gravis ([place])   Cardiogenic shock ([place])   Chest pain   Hypotension   Acute respiratory failure with hypoxemia ([place])   Acute cholecystitis   Acute encephalopathy   Septic shock ([place])   AKI (acute kidney injury) ([place])   Palliative care by specialist   DNR (do not resuscitate) discussion   CHF (congestive heart failure) ([place])   Cerebrovascular accident (CVA) due to embolism of middle cerebral artery ([place])   Endotracheal tube present   Adult failure to thrive   NSTEMI (non-ST elevated myocardial infarction) [facility])   [place] Hospital Course (including significant findings, care, treatment, and services provided and events leading to death)  84 yo male who initally presented to [facility] with findings consistent with sepsis. Recent e. Coli bacteremia treated at [facility] (10/19-10/29). While at [facility], patient developed chest pain with multiple PACs and PVCs and was subsequently transferred to [facility] for further evaluation and treatment.  On arrival, trops>27k. Lactate 3.7. white count 37k. No ST elevation on initial EKG however did develop new LBBB on later EKG. Cardiology consulted and spoke with intervential cardiology. Given septic state, renal failure, was not candidate for cath lab. Imaging suggestive of biliary source of sepsis with cholecytitis present. Not a candidate for lap chole at that time so IR consulted with plans for perc  cholecystotomy tube place 11/23 however on repeat imaging, gallbladder distension was resolved so tube placement was deferred.  He was intubated on 11/22 for worsening respiratory distress.  He had worsening heart failure with reduced ejection fraction, and worsening pulmonary edema.  On 11/30 he became acutely unresponsive overnight, and was found to have new cerebellar strokes for which treatment was started with heparin infusion.  Unfortunately he developed worsening delirium, and worsening sepsis and leukocytosis which was not responding to broad-spectrum antibiotic therapy.  And on the morning of 12/5 was found to have massive hemoperitoneum.  His blood cultures were positive for fungemia.  Given his overwhelming medical issues, his inability to be anticoagulated for both his heart attack as well as his stroke, and his new fungemia, we discussed goals of care with the family.  They felt that he was suffering and elected to make him comfort care.  The patient passed away on 2023/02/21 at 98: 2.  His death was peaceful and family was at bedside.   Pertinent Labs and Studies  Significant Diagnostic Studies Ct Abdomen Pelvis Wo Contrast  Result Date: 01/30/2019 CLINICAL DATA:  Hematoma. Pancreatitis. Fevers and elevated white count. EXAM: CT ABDOMEN AND PELVIS WITHOUT CONTRAST TECHNIQUE: Multidetector CT imaging of the abdomen and pelvis was performed following the standard protocol without IV contrast. COMPARISON:  01/18/2019 FINDINGS: Lower chest: The heart size is mildly enlarged. Aortic atherosclerosis and 3 vessel coronary artery calcifications. Small bilateral pleural effusions. Hepatobiliary: Large, heterogeneous mass within the left upper quadrant of the abdomen has mass effect upon the lateral segment of left lobe of liver. Gallbladder is unremarkable. No biliary ductal dilatation. Pancreas:  No significant pancreatic edema. No main duct dilatation or mass noted. Spleen: Normal in size without focal  abnormality. Adrenals/Urinary Tract: Normal adrenal glands. Right kidney unremarkable. Nonobstructing stones within the left kidney measure up to 6 mm. No hydronephrosis identified bilaterally. The urinary bladder is not well visualized and may be collapsed. Stomach/Bowel: Nasogastric tube tip is at the gastro pyloric junction. Within the left upper quadrant of the abdomen there is a large, well-circumscribed heterogeneous mass which appears closely associated with the proximal stomach at the level of the gastric cardia. This mass measures 13.4 x 10.9 by 11.3 cm (volume = 864 cm^3). No small or large bowel wall thickening, inflammation or distension identified. Vascular/Lymphatic: Aortic atherosclerosis. No aneurysm. No abdominopelvic adenopathy. Reproductive: Prostate is unremarkable. Other: There is a moderate volume of intermediate attenuation fluid identified throughout the abdomen and pelvis concerning for hemoperitoneum. Corresponding Hounsfield units measure between 22.7 and 17. Musculoskeletal: Previous right hip arthroplasty. Degenerative disc disease. IMPRESSION: 1. There is a large, heterogeneous mass within the left upper quadrant of the abdomen which appears closely associated with the proximal stomach at the level of the gastric cardia. This is new from 01/18/2019 and is concerning for large hematoma. 2. There is a moderate volume of intermediate attenuation fluid identified throughout the abdomen and pelvis concerning for hemoperitoneum. 3. Aortic atherosclerosis. 4. Small bilateral pleural effusions. 5. Nonobstructing left renal calculi. 6. Nasogastric tube tip is at the gastro pyloric junction. Aortic Atherosclerosis (ICD10-I70.0). Critical Value/emergent results were called by telephone at the time of interpretation on 01/30/2019 at 9:34 am to providerDr Shearon Stalls, who verbally acknowledged these results. Electronically Signed   By: Kerby Moors M.D.   On: 01/30/2019 09:36   Dg Abd 1 View  Result  Date: 01/10/2019 CLINICAL DATA:  OG tube placement. EXAM: ABDOMEN - 1 VIEW COMPARISON:  Chest x-ray 01/24/2019. FINDINGS: A G-tube noted with tip coiled in the stomach. No gastric or bowel distention. No free air. Right IJ line noted with tip over cavoatrial junction. IMPRESSION: OG tube noted with tip coiled in the stomach. Electronically Signed   By: Marcello Moores  Register   On: 01/20/2019 11:34   Dg Abd 1 View  Result Date: 01/17/2019 CLINICAL DATA:  OG tube placement EXAM: ABDOMEN - 1 VIEW COMPARISON:  January 17, 2019 FINDINGS: The OG tube now projects over the gastric body. The endotracheal tube terminates above the carina by approximately 3.2 cm. Multifocal airspace opacities are again noted in the lungs. There is a nonspecific bowel gas pattern. IMPRESSION: Improved OG tube positioning status post repositioning. Electronically Signed   By: Constance Holster M.D.   On: 01/17/2019 23:58   Dg Abdomen 1 View  Result Date: 01/04/2019 CLINICAL DATA:  Central line placement. EXAM: ABDOMEN - 1 VIEW COMPARISON:  None. FINDINGS: Right femoral vein catheter has been inserted. The tip is in the region of the right iliac vein at the level of the S1 segment of the sacrum. The visualized bowel gas pattern is normal. No acute bone abnormality. IMPRESSION: 1. Central line tip is in the right iliac vein at the level of the S1 segment of the sacrum. 2. Benign-appearing abdomen. Electronically Signed   By: Lorriane Shire M.D.   On: 01/22/2019 15:02   Ct Chest W Contrast  Result Date: 01/16/2019 CLINICAL DATA:  Fever of unknown origin, sepsis, leukocytosis. EXAM: CT CHEST, ABDOMEN, AND PELVIS WITH CONTRAST TECHNIQUE: Multidetector CT imaging of the chest, abdomen and pelvis was performed following the standard protocol during bolus administration  of intravenous contrast. CONTRAST:  19mL OMNIPAQUE IOHEXOL 300 MG/ML  SOLN COMPARISON:  CT abdomen pelvis 12/14/2018 and CT chest 07/22/2018. FINDINGS: CT CHEST FINDINGS  Cardiovascular: Atherosclerotic calcification of the aorta, aortic valve and coronary arteries. Heart is enlarged. No pericardial effusion. Mediastinum/Nodes: 1.6 cm low-attenuation left thyroid nodule is unchanged. No pathologically enlarged mediastinal, hilar or axillary lymph nodes. Esophagus is unremarkable. Lungs/Pleura: Fairly diffuse septal thickening. Mild dependent atelectasis bilaterally. No pleural fluid. Debris is seen dependently in the lower trachea and mainstem bronchi. Musculoskeletal: Degenerative changes in the spine. No worrisome lytic or sclerotic lesions. CT ABDOMEN PELVIS FINDINGS Hepatobiliary: Hyperattenuating lesion in the dome of the liver measures 1.2 cm (2/42) and is nonspecific, possibly representing a flash fill hemangioma or perfusion anomaly. Gallbladder appears mildly distended and there is surrounding haziness and stranding, similar to 12/14/2018. No biliary ductal dilatation. Pancreas: Negative. Spleen: Negative. Adrenals/Urinary Tract: Adrenal glands and right kidney are unremarkable. Stones are seen in the left kidney. Probable left renal sinus cysts. Ureters are decompressed. Bladder is low in volume. Stomach/Bowel: Stomach, small bowel, appendix and colon are unremarkable. Vascular/Lymphatic: Atherosclerotic calcification of the aorta without aneurysm. Right-sided femoral venous line terminates near the bifurcation of the internal and external iliac veins on the right. No pathologically enlarged lymph nodes. Reproductive: Prostatectomy. Other: Small bilateral inguinal hernias contain fat. Tiny amount of fluid in the right paracolic gutter. Mesenteries and peritoneum are otherwise unremarkable. Musculoskeletal: Right hip arthroplasty. Degenerative changes in the left hip and spine. IMPRESSION: 1. Mild pericholecystic edema, similar to 12/14/2018. A common bile duct stone was questioned at that time. Consider right upper quadrant ultrasound in further evaluation for acute  cholecystitis. Otherwise, no findings to explain the patient's fever and leukocytosis. 2. Pulmonary edema. 3. Left renal stones. 4. Aortic atherosclerosis (ICD10-170.0). Coronary artery calcification. Electronically Signed   By: Lorin Picket M.D.   On: 01/20/2019 16:34   Mr Angio Head Wo Contrast  Result Date: 01/18/2019 CLINICAL DATA:  Code stroke. Unresponsive. EXAM: MRI HEAD WITHOUT CONTRAST MRA HEAD WITHOUT CONTRAST TECHNIQUE: Multiplanar, multiecho pulse sequences of the brain and surrounding structures were obtained without intravenous contrast. Angiographic images of the head were obtained using MRA technique without contrast. COMPARISON:  CT head without contrast 12/30/2018 FINDINGS: MRI HEAD FINDINGS Brain: The diffusion-weighted images confirm acute nonhemorrhagic infarcts of the cerebellum bilaterally, left greater than right. An additional right periventricular white matter nonhemorrhagic acute infarct is noted in the medial right temporal lobe. T2 FLAIR signal is associated with the areas of acute infarction. Mild periventricular T2 hyperintensities are present as well. Moderate generalized atrophy is noted. The ventricles are proportionate to the degree of atrophy. No significant extraaxial fluid collection is present. Brainstem is within normal limits. Vascular: Flow voids are present in the anterior circulation. Standard T2 sequences were not performed. Skull and upper cervical spine: Skull base is unremarkable. Sinuses/Orbits: The paranasal sinuses and mastoid air cells are clear. The globes and orbits are within normal limits. MRA HEAD FINDINGS Signal loss in the anterior genu of the cavernous internal carotid arteries bilaterally appears to be artifactual. No other focal stenosis is present in the internal carotid arteries from the high cervical segments through the ICA termini bilaterally. There is slight signal loss in the distal right A1 segment, likely artifactual. A1 and M1 vessels  are otherwise within normal limits. MCA bifurcations are intact. Asymmetric attenuation of right MCA branches is noted. Fetal type posterior cerebral arteries are present bilaterally. The right posterior cerebral artery scratched at  the right vertebral artery is the dominant vessel. There is no focal stenosis. Flow is present in the basilar artery. The superior cerebellar arteries are not visualized. There is irregular narrowing of the non dominant left vertebral artery with distal tapering suggesting high-grade stenosis or occlusion. IMPRESSION: 1. Acute/subacute nonhemorrhagic infarcts involving the cerebellum bilaterally, left greater than right. 2. Additional acute/subacute right periventricular white matter nonhemorrhagic infarct in the medial right temporal lobe. 3. Moderate generalized atrophy and white matter disease likely reflects the sequela of chronic microvascular ischemia. 4. High-grade stenosis or occlusion of the non dominant left vertebral artery. 5. Fetal type posterior cerebral arteries bilaterally. 6. Flow is present in the basilar artery. Basilar artery terminates at the superior cerebellar arteries with fetal type posterior cerebral arteries bilaterally. 7. No significant stenosis within the dominant right vertebral artery. Electronically Signed   By: San Morelle M.D.   On: 01/08/2019 06:22   Mr Brain Wo Contrast  Result Date: 01/07/2019 CLINICAL DATA:  Code stroke. Unresponsive. EXAM: MRI HEAD WITHOUT CONTRAST MRA HEAD WITHOUT CONTRAST TECHNIQUE: Multiplanar, multiecho pulse sequences of the brain and surrounding structures were obtained without intravenous contrast. Angiographic images of the head were obtained using MRA technique without contrast. COMPARISON:  CT head without contrast 01/10/2019 FINDINGS: MRI HEAD FINDINGS Brain: The diffusion-weighted images confirm acute nonhemorrhagic infarcts of the cerebellum bilaterally, left greater than right. An additional right  periventricular white matter nonhemorrhagic acute infarct is noted in the medial right temporal lobe. T2 FLAIR signal is associated with the areas of acute infarction. Mild periventricular T2 hyperintensities are present as well. Moderate generalized atrophy is noted. The ventricles are proportionate to the degree of atrophy. No significant extraaxial fluid collection is present. Brainstem is within normal limits. Vascular: Flow voids are present in the anterior circulation. Standard T2 sequences were not performed. Skull and upper cervical spine: Skull base is unremarkable. Sinuses/Orbits: The paranasal sinuses and mastoid air cells are clear. The globes and orbits are within normal limits. MRA HEAD FINDINGS Signal loss in the anterior genu of the cavernous internal carotid arteries bilaterally appears to be artifactual. No other focal stenosis is present in the internal carotid arteries from the high cervical segments through the ICA termini bilaterally. There is slight signal loss in the distal right A1 segment, likely artifactual. A1 and M1 vessels are otherwise within normal limits. MCA bifurcations are intact. Asymmetric attenuation of right MCA branches is noted. Fetal type posterior cerebral arteries are present bilaterally. The right posterior cerebral artery scratched at the right vertebral artery is the dominant vessel. There is no focal stenosis. Flow is present in the basilar artery. The superior cerebellar arteries are not visualized. There is irregular narrowing of the non dominant left vertebral artery with distal tapering suggesting high-grade stenosis or occlusion. IMPRESSION: 1. Acute/subacute nonhemorrhagic infarcts involving the cerebellum bilaterally, left greater than right. 2. Additional acute/subacute right periventricular white matter nonhemorrhagic infarct in the medial right temporal lobe. 3. Moderate generalized atrophy and white matter disease likely reflects the sequela of chronic  microvascular ischemia. 4. High-grade stenosis or occlusion of the non dominant left vertebral artery. 5. Fetal type posterior cerebral arteries bilaterally. 6. Flow is present in the basilar artery. Basilar artery terminates at the superior cerebellar arteries with fetal type posterior cerebral arteries bilaterally. 7. No significant stenosis within the dominant right vertebral artery. Electronically Signed   By: San Morelle M.D.   On: 01/05/2019 06:22   US Abdomen Complete  Result Date: 01/16/2019 CLINICAL DATA:  Abdominal pain for 1 day.  Elevated LFTs. EXAM: ABDOMEN ULTRASOUND COMPLETE COMPARISON:  CT abdomen pelvis dated 01/22/2019 FINDINGS: Gallbladder: The gallbladder wall is thickened, measuring 9 mm. Nonshadowing gallstones/sludge are seen layering dependently within the gallbladder. Sonographic Percell Miller sign is negative. Common bile duct: Diameter: 2 mm Liver: No focal lesion identified. Within normal limits in parenchymal echogenicity. Portal vein is patent on color Doppler imaging with normal direction of blood flow towards the liver. IVC: No abnormality visualized. Pancreas: Obscured by overlying bowel gas. Spleen: Size and appearance within normal limits. Right Kidney: Length: 9.8. Echogenicity within normal limits. No mass or hydronephrosis visualized. Left Kidney: Length: 10.0. Echogenicity within normal limits. A nonobstructive left renal calculus measures 1.2 cm. No mass or hydronephrosis visualized. Abdominal aorta: No aneurysm visualized. Other findings: None. IMPRESSION: 1. Thickened gallbladder wall with nonshadowing gallstones/sludge. Although the sonographic Percell Miller sign is reported as negative, these findings are suspicious for acute cholecystitis. Electronically Signed   By: Zerita Boers M.D.   On: 01/16/2019 13:44   Ct Abdomen Pelvis W Contrast  Result Date: 01/02/2019 CLINICAL DATA:  Fever of unknown origin, sepsis, leukocytosis. EXAM: CT CHEST, ABDOMEN, AND PELVIS WITH  CONTRAST TECHNIQUE: Multidetector CT imaging of the chest, abdomen and pelvis was performed following the standard protocol during bolus administration of intravenous contrast. CONTRAST:  70mL OMNIPAQUE IOHEXOL 300 MG/ML  SOLN COMPARISON:  CT abdomen pelvis 12/14/2018 and CT chest 07/22/2018. FINDINGS: CT CHEST FINDINGS Cardiovascular: Atherosclerotic calcification of the aorta, aortic valve and coronary arteries. Heart is enlarged. No pericardial effusion. Mediastinum/Nodes: 1.6 cm low-attenuation left thyroid nodule is unchanged. No pathologically enlarged mediastinal, hilar or axillary lymph nodes. Esophagus is unremarkable. Lungs/Pleura: Fairly diffuse septal thickening. Mild dependent atelectasis bilaterally. No pleural fluid. Debris is seen dependently in the lower trachea and mainstem bronchi. Musculoskeletal: Degenerative changes in the spine. No worrisome lytic or sclerotic lesions. CT ABDOMEN PELVIS FINDINGS Hepatobiliary: Hyperattenuating lesion in the dome of the liver measures 1.2 cm (2/42) and is nonspecific, possibly representing a flash fill hemangioma or perfusion anomaly. Gallbladder appears mildly distended and there is surrounding haziness and stranding, similar to 12/14/2018. No biliary ductal dilatation. Pancreas: Negative. Spleen: Negative. Adrenals/Urinary Tract: Adrenal glands and right kidney are unremarkable. Stones are seen in the left kidney. Probable left renal sinus cysts. Ureters are decompressed. Bladder is low in volume. Stomach/Bowel: Stomach, small bowel, appendix and colon are unremarkable. Vascular/Lymphatic: Atherosclerotic calcification of the aorta without aneurysm. Right-sided femoral venous line terminates near the bifurcation of the internal and external iliac veins on the right. No pathologically enlarged lymph nodes. Reproductive: Prostatectomy. Other: Small bilateral inguinal hernias contain fat. Tiny amount of fluid in the right paracolic gutter. Mesenteries and  peritoneum are otherwise unremarkable. Musculoskeletal: Right hip arthroplasty. Degenerative changes in the left hip and spine. IMPRESSION: 1. Mild pericholecystic edema, similar to 12/14/2018. A common bile duct stone was questioned at that time. Consider right upper quadrant ultrasound in further evaluation for acute cholecystitis. Otherwise, no findings to explain the patient's fever and leukocytosis. 2. Pulmonary edema. 3. Left renal stones. 4. Aortic atherosclerosis (ICD10-170.0). Coronary artery calcification. Electronically Signed   By: Lorin Picket M.D.   On: 01/01/2019 16:34   Ct Abd Limited W/o Cm  Result Date: 01/18/2019 CLINICAL DATA:  Sepsis, elevated WBC, gallbladder distension previous CT. Cholecystostomy drain catheter placement requested EXAM: CT ABDOMEN WITHOUT CONTRAST LIMITED TECHNIQUE: Multidetector CT imaging of the abdomen was performed following the standard protocol without IV contrast. COMPARISON:  12/27/2018 FINDINGS: Lower chest: New  small bilateral pleural effusions. New consolidation/atelectasis posteriorly in both lower lobes. Extensive coronary calcifications. Hepatobiliary: The gallbladder is incompletely distended, significantly decompressed from the prior study. No significant pericholecystic inflammatory/edematous changes. No radiodense gallstones. No focal liver lesion evident. No biliary ductal dilatation. Pancreas: Visualized portions unremarkable Spleen: Small, without focal lesion. Adrenals/Urinary Tract: Normal adrenals. Kidneys are incompletely visualized. Stomach/Bowel: Stomach is decompressed by a nasogastric tube. Visualized portions of small bowel and colon unremarkable. Vascular/Lymphatic: Atheromatous aorta. No adenopathy identified. Other: Trace perihepatic and perisplenic ascites. No evident free air. Musculoskeletal: Usual degenerative changes in the visualized lower lumbar spine. IMPRESSION: 1. Interval resolution of gallbladder distension, without  worrisome findings. After telephone consultation with Dr. Kae Heller from general surgery, percutaneous cholecystostomy catheter placement was deferred. 2. New bilateral pleural effusions and bibasilar consolidation/atelectasis. 3. Coronary and Aortic Atherosclerosis (ICD10-170.0). Electronically Signed   By: Lucrezia Europe M.D.   On: 01/18/2019 16:58   Am Dg Chest Port 1 View  Result Date: 01/30/2019 CLINICAL DATA:  Respiratory failure, hypertension pneumonia. EXAM: PORTABLE CHEST 1 VIEW COMPARISON:  01/29/2019 FINDINGS: Endotracheal tube approximately 4.2 cm above the carina in the proximal trachea, perhaps slightly advanced since prior study. Right-sided central venous catheter again terminates at the caval to atrial junction. Gastric tube courses off the field of the radiograph. Cardiomediastinal contours are stable. Linear opacities are noted at the lung bases. Lung volumes are low. Appearance is unchanged since the previous exam. No acute bone finding. IMPRESSION: 1. Endotracheal tube approximately 4.2 cm above the carina, perhaps slightly advanced since prior study. 2. Bibasilar linear opacities are unchanged and may reflect atelectasis in the setting of low lung volumes. Electronically Signed   By: Zetta Bills M.D.   On: 01/30/2019 11:00   Am Dg Chest Port 1 View  Result Date: 01/29/2019 CLINICAL DATA:  ETT, respiratory failure EXAM: PORTABLE CHEST 1 VIEW COMPARISON:  01/27/2019 FINDINGS: Endotracheal tube terminates 5.5 cm above the carina. Low lung volumes. Bilateral lower lobe atelectasis. Pulmonary vascular congestion/vascular crowding without frank interstitial edema. No definite pleural effusions. No pneumothorax. The heart is normal in size. Right IJ venous catheter terminates at the cavoatrial junction. Enteric tube courses into the stomach. IMPRESSION: Endotracheal tube terminates 5.5 cm above the carina. Additional support apparatus as above. Low lung volumes with bilateral lower lobe  atelectasis. No frank interstitial edema. Electronically Signed   By: Julian Hy M.D.   On: 01/29/2019 08:20   Dg Chest Port 1 View  Result Date: 01/27/2019 CLINICAL DATA:  Respiratory failure.  Intubated. EXAM: PORTABLE CHEST 1 VIEW COMPARISON:  01/24/2019 FINDINGS: The endotracheal tube is 4.5 cm above the carina. The NG tube is coursing down the esophagus and into the stomach. Stable position of the right IJ central venous catheter. The heart is mildly enlarged but stable. Stable tortuosity, ectasia and calcification of the thoracic aorta. Low lung volumes with vascular crowding and bibasilar atelectasis. Mild central vascular congestion but no overt pulmonary edema or definite pleural effusions. The bony thorax is intact. IMPRESSION: ET and NG tubes in good position. Low lung volumes with vascular crowding and bibasilar atelectasis. Electronically Signed   By: Marijo Sanes M.D.   On: 01/27/2019 08:17   Dg Chest Port 1 View  Result Date: 01/24/2019 CLINICAL DATA:  Respiratory failure, code sepsis. EXAM: PORTABLE CHEST 1 VIEW COMPARISON:  Chest x-rays dated 01/23/2019 FINDINGS: RIGHT IJ central line appears well positioned with tip at the level of the lower SVC/cavoatrial junction. Heart size and mediastinal contours are stable.  Probable mild bibasilar atelectasis. Mild central pulmonary vascular congestion. Stable elevation of the RIGHT hemidiaphragm. IMPRESSION: 1. Mild central pulmonary vascular congestion suggesting mild CHF/volume overload. No evidence of pneumonia or overt alveolar pulmonary edema. 2. Probable mild bibasilar atelectasis. Electronically Signed   By: Franki Cabot M.D.   On: 01/24/2019 06:19   Dg Chest Port 1 View  Result Date: 01/23/2019 CLINICAL DATA:  Cough for 2 days EXAM: PORTABLE CHEST 1 VIEW COMPARISON:  Chest radiograph 01/23/2019 FINDINGS: Right IJ central venous catheter tip projects over the superior vena cava. Monitoring leads overlie the patient. Stable  cardiac and mediastinal contours. Aortic atherosclerosis. Elevation right hemidiaphragm. Bilateral interstitial pulmonary opacities. No pleural effusion or pneumothorax. IMPRESSION: Mild interstitial opacities bilaterally may represent edema. Elevation right hemidiaphragm. Electronically Signed   By: Lovey Newcomer M.D.   On: 01/23/2019 18:00   Am Dg Chest Port 1 View  Result Date: 01/23/2019 CLINICAL DATA:  Respiratory failure. EXAM: PORTABLE CHEST 1 VIEW COMPARISON:  Chest x-ray dated 01/21/2019. FINDINGS: Endotracheal tube is been removed. RIGHT IJ central line remains well positioned with tip at the level of the lower SVC. Heart size and mediastinal contours are stable. Stable mild central pulmonary vascular congestion. Lungs otherwise clear. No pleural effusion or pneumothorax is seen. IMPRESSION: 1. Stable mild central pulmonary vascular congestion. No overt alveolar pulmonary edema. 2. No evidence of pneumonia. 3. Endotracheal tube has been removed. Electronically Signed   By: Franki Cabot M.D.   On: 01/23/2019 07:55   Dg Chest Port 1 View  Result Date: 01/21/2019 CLINICAL DATA:  Postop evaluation.  Endotracheal tube assessment. EXAM: PORTABLE CHEST 1 VIEW COMPARISON:  01/20/2019 FINDINGS: Endotracheal tube has tip 5.1 cm above the carina. Enteric tube courses into the stomach and off the film as tip is not visualized. Right IJ central venous catheter has tip at the cavoatrial junction unchanged. Lungs are somewhat hypoinflated without evidence of pneumothorax. No significant effusion. Mild hazy opacification over the right base unchanged. Subtle hazy prominence of the perihilar markings with interval improvement. Cardiomediastinal silhouette and remainder of the exam is unchanged. IMPRESSION: 1. Interval improvement minimal vascular congestion. Stable minimal hazy opacification right base. 2.  Tubes and lines as described. Electronically Signed   By: Marin Olp M.D.   On: 01/21/2019 09:10   Dg  Chest Port 1 View  Result Date: 01/20/2019 CLINICAL DATA:  Endotracheal tube, respiratory failure. EXAM: PORTABLE CHEST 1 VIEW COMPARISON:  January 19, 2019. FINDINGS: Stable cardiomegaly. Endotracheal and nasogastric tubes are unchanged in position. Right internal jugular catheter is unchanged. No pneumothorax is noted. Stable bibasilar atelectasis is noted. Bony thorax is unremarkable. IMPRESSION: Stable support apparatus. Stable bibasilar subsegmental atelectasis. Electronically Signed   By: Marijo Conception M.D.   On: 01/20/2019 07:34   Dg Chest Port 1 View  Result Date: 01/19/2019 CLINICAL DATA:  Endotracheal tube present. Respiratory failure. EXAM: PORTABLE CHEST 1 VIEW COMPARISON:  One-view chest x-ray 01/18/2019 FINDINGS: The heart is enlarged. Endotracheal tube is stable, 3.8 cm above the carina. NG tube courses off the inferior border the film. Right IJ line is stable. Aortic atherosclerosis is again seen. There is improved aeration in both lungs. Moderate pulmonary vascular congestion is improved. Mild airspace disease remains at the bases. IMPRESSION: 1. Stable cardiomegaly. 2. Improving pulmonary vascular congestion. 3. Mild bibasilar airspace disease likely reflects atelectasis. Infection is not excluded. 4. Support apparatus is stable. Electronically Signed   By: San Morelle M.D.   On: 01/19/2019 08:49  Dg Chest Port 1 View  Result Date: 01/18/2019 CLINICAL DATA:  Insertion of right central line. EXAM: PORTABLE CHEST 1 VIEW 3:23 p.m. COMPARISON:  01/18/2019 at 5:25 a.m. FINDINGS: New right central venous catheter tip is at the cavoatrial junction 5 cm below the carina in good position. No pneumothorax. Endotracheal tube is in good position 3.6 cm above the carina. NG tube tip is in the distal stomach. The bilateral pulmonary infiltrates have improved since the prior study, likely representing pulmonary edema. Effusions. Aortic atherosclerosis. No acute bone abnormality.  IMPRESSION: 1. New right central venous catheter tip is at the cavoatrial junction. No pneumothorax. 2. Improving bilateral pulmonary infiltrates, likely pulmonary edema. 3. Aortic Atherosclerosis (ICD10-I70.0). Electronically Signed   By: Lorriane Shire M.D.   On: 01/18/2019 15:33   Portable Chest Xray  Result Date: 01/18/2019 CLINICAL DATA:  ET tube present, respiratory failure. EXAM: PORTABLE CHEST 1 VIEW COMPARISON:  Chest radiograph 01/17/2011 FINDINGS: ET tube terminates 2.5 cm above the level of the carina. An enteric tube passes below the level of left hemidiaphragm with tip projecting in the expected location of the stomach. Side port not well-visualized, if present. Stable cardiomegaly. Aortic atherosclerosis. Patchy bilateral airspace opacities are similar to prior examination and may reflect edema or infection. Probable small pleural effusions and atelectasis again noted at the lung bases. No evidence of pneumothorax. IMPRESSION: Lines and tubes as detailed. Stable cardiomegaly. Patchy bilateral airspace opacities are similar to prior examination and may reflect edema or infection. Probable small pleural effusions on atelectasis again noted at the lung bases. Electronically Signed   By: Kellie Simmering DO   On: 01/18/2019 07:49   Dg Chest Port 1 View  Result Date: 01/17/2019 CLINICAL DATA:  Intubation. EXAM: PORTABLE CHEST 1 VIEW COMPARISON:  January 17, 2019 FINDINGS: The heart size remains enlarged. The patient has been intubated with the endotracheal tube terminating 4 cm above the carina. The enteric tube tip projects over the gastric body with the side hole projecting over the GE junction/distal esophagus. Aortic calcifications are noted. There appear to be small bilateral pleural effusions. Again noted are perihilar airspace opacities with focal areas of consolidation or atelectasis at the lung bases. There is no pneumothorax. IMPRESSION: 1. Endotracheal tube as above. 2. The OG tube should  be advanced further into the stomach. 3. Persistent multifocal airspace opacities which can be seen in patients with pulmonary edema or an atypical infectious process. 4. Probable small bilateral pleural effusions. Electronically Signed   By: Constance Holster M.D.   On: 01/17/2019 18:43   Dg Chest Port 1 View  Result Date: 01/17/2019 CLINICAL DATA:  Respiratory distress. Abnormal respiration. EXAM: PORTABLE CHEST 1 VIEW COMPARISON:  Radiograph yesterday. CT 01/04/2019 FINDINGS: Unchanged elevation of right hemidiaphragm. Progressive bilateral perihilar interstitial opacities and septal thickening. Findings are slightly asymmetric increased in the right suprahilar lung. Cardiomegaly slightly progressed. No visible pleural effusions. No pneumothorax. No acute osseous abnormalities. IMPRESSION: Worsening pulmonary edema. No element of superimposed pneumonia is also considered given the developing asymmetry. Slight increase in cardiomegaly. Electronically Signed   By: Keith Rake M.D.   On: 01/17/2019 02:06   Dg Chest Port 1 View  Result Date: 01/16/2019 CLINICAL DATA:  Wheezing and left-sided chest pain EXAM: PORTABLE CHEST 1 VIEW COMPARISON:  Chest CT from yesterday FINDINGS: Diffuse interstitial opacity with Kerley lines. There is patchy airspace opacity as well. Mild cardiomegaly. Elevated right diaphragm which is also seen previously. No visible effusion or pneumothorax IMPRESSION: CHF pattern  which is worsened. Electronically Signed   By: Monte Fantasia M.D.   On: 01/16/2019 07:15   Dg Chest Port 1 View  Result Date: 01/12/2019 CLINICAL DATA:  Fever. Sudden onset of weakness last night. EXAM: PORTABLE CHEST 1 VIEW COMPARISON:  Radiographs 12/14/2018 and 08/04/2017.  CT 07/22/2018. FINDINGS: 1212 hours. The heart size and mediastinal contours are stable with aortic atherosclerosis. There is stable chronic elevation/eventration of the right hemidiaphragm with associated chronic right basilar  atelectasis. The lungs are otherwise clear. There is no pleural effusion or pneumothorax. The bones appear unremarkable. Telemetry leads overlie the chest. IMPRESSION: Stable postoperative chest with chronic right basilar atelectasis. No acute cardiopulmonary process. Electronically Signed   By: Richardean Sale M.D.   On: 01/05/2019 12:42   Dg Abd Portable 1v  Result Date: 01/17/2019 CLINICAL DATA:  OG tube placement. EXAM: PORTABLE ABDOMEN - 1 VIEW COMPARISON:  January 14, 2001 FINDINGS: The OG tube tip projects over the gastric body. The side hole projects over the expected region of the GE junction. The bowel gas pattern is nonspecific with gaseous distention of loops of small bowel and colon scattered throughout the abdomen. There is no evidence for pneumatosis or free air involving the visualized bowel loops. IMPRESSION: 1. Enteric tube as above. Consider advancing the OG tube further into the stomach. 2. Nonspecific bowel gas pattern with mild gaseous distention of loops of small bowel and colon. Electronically Signed   By: Constance Holster M.D.   On: 01/17/2019 18:41   Ct Head Code Stroke Wo Contrast  Result Date: 01/24/2019 CLINICAL DATA:  Code stroke. Focal neuro deficit of less than 6 hours. Stroke suspected. EXAM: CT HEAD WITHOUT CONTRAST TECHNIQUE: Contiguous axial images were obtained from the base of the skull through the vertex without intravenous contrast. COMPARISON:  CT head without contrast 12/14/2018 FINDINGS: Brain: Bilateral nonhemorrhagic superior cerebellar infarcts are new since the prior exam, more prominent left than right. No acute supratentorial lesion is present. Mild atrophy and white matter changes are stable, within normal limits for age. The ventricles are of normal size. No significant extraaxial fluid collection is present. Vascular: Atherosclerotic calcifications are present within the cavernous internal carotid arteries and at the dural margin of both vertebral  arteries. There is no hyperdense vessel. Skull: Atherosclerotic calcifications are present within the cavernous internal carotid arteries and at the dural margin the vertebral arteries bilaterally. There is mild density in the mid basilar artery and at the basilar tip raising concern for thrombus. Sinuses/Orbits: Calvarium is intact. No focal lytic or blastic lesions are present. Other: The paranasal sinuses and mastoid air cells are clear. Bilateral lens replacements are noted. Globes and orbits are otherwise unremarkable. ASPECTS [facility] Stroke Program Early CT Score) - Ganglionic level infarction (caudate, lentiform nuclei, internal capsule, insula, M1-M3 cortex): 7/7 - Supraganglionic infarction (M4-M6 cortex): 3/3 Total score (0-10 with 10 being normal): 10/10 IMPRESSION: 1. 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 Increased density of the basilar artery raises concern for thrombus. Recommend CTA of the head and neck for further evaluation 3. ASPECTS is 10/10 The above was relayed via text pager to Dr. Samara Snide on 01/24/2019 at 04:58 . Electronically Signed   By: San Morelle M.D.   On: 01/18/2019 04:59   Vas [place] Lower Extremity Venous (dvt)  Result Date: 01/26/2019  Lower Venous Study Indications: Stroke.  Limitations: Bandages. Comparison Study: no prior Performing Technologist: Abram Sander RVS  Examination Guidelines: A complete evaluation includes  B-mode imaging, spectral Doppler, color Doppler, and power Doppler as needed of all accessible portions of each vessel. Bilateral testing is considered an integral part of a complete examination. Limited examinations for reoccurring indications may be performed as noted.  +---------+---------------+---------+-----------+----------+--------------+ RIGHT    CompressibilityPhasicitySpontaneityPropertiesThrombus Aging  +---------+---------------+---------+-----------+----------+--------------+ CFV                                                   Not visualized +---------+---------------+---------+-----------+----------+--------------+ SFJ                                                   Not visualized +---------+---------------+---------+-----------+----------+--------------+ FV Prox                                               Not visualized +---------+---------------+---------+-----------+----------+--------------+ FV Mid   Full                                                        +---------+---------------+---------+-----------+----------+--------------+ FV DistalFull                                                        +---------+---------------+---------+-----------+----------+--------------+ PFV                                                   Not visualized +---------+---------------+---------+-----------+----------+--------------+ POP      Full           Yes      Yes                                 +---------+---------------+---------+-----------+----------+--------------+ PTV      Full                                                        +---------+---------------+---------+-----------+----------+--------------+ PERO     Full                                                        +---------+---------------+---------+-----------+----------+--------------+   +---------+---------------+---------+-----------+----------+--------------+ LEFT     CompressibilityPhasicitySpontaneityPropertiesThrombus Aging +---------+---------------+---------+-----------+----------+--------------+ CFV      Full           Yes      Yes                                 +---------+---------------+---------+-----------+----------+--------------+  SFJ      Full                                                         +---------+---------------+---------+-----------+----------+--------------+ FV Prox  Full                                                        +---------+---------------+---------+-----------+----------+--------------+ FV Mid   Full                                                        +---------+---------------+---------+-----------+----------+--------------+ FV DistalFull                                                        +---------+---------------+---------+-----------+----------+--------------+ PFV      Full                                                        +---------+---------------+---------+-----------+----------+--------------+ POP      Full           Yes      Yes                                 +---------+---------------+---------+-----------+----------+--------------+ PTV      Full                                                        +---------+---------------+---------+-----------+----------+--------------+ PERO                                                  Not visualized +---------+---------------+---------+-----------+----------+--------------+     Summary: Right: There is no evidence of deep vein thrombosis in the lower extremity. However, portions of this examination were limited- see technologist comments above. No cystic structure found in the popliteal fossa. Left: There is no evidence of deep vein thrombosis in the lower extremity. No cystic structure found in the popliteal fossa.  *See table(s) above for measurements and observations. Electronically signed by Deitra Mayo MD on 01/26/2019 at 12:19:37 PM.    Final    Ir Angio Intra Extracran Sel Com Carotid Innominate Bilat Mod Sed  Result Date: 01/27/2019 CLINICAL DATA:  New onset of bilateral lower extremity weakness, severe dysarthria, difficulty breathing and varying level of consciousness. Abnormal MRI MRA  of the brain. EXAM: BILATERAL COMMON CAROTID AND INNOMINATE  ANGIOGRAPHY COMPARISON:  MRI MRA of the brain of January 25, 2019. MEDICATIONS: Heparin 2000 units IV; vancomycin 1 g IV antibiotic was administered within 1 hour of the procedure. ANESTHESIA/SEDATION: General anesthesia. CONTRAST:  Isovue 300 approximately 60 mL. FLUOROSCOPY TIME:  Fluoroscopy Time: 12 minutes 12 seconds (1185 mGy). COMPLICATIONS: None immediate. TECHNIQUE: Informed written consent was obtained from the patient's wife and also in concordance with 2 MDs. All questions were addressed. Maximal Sterile Barrier Technique was utilized including caps, mask, sterile gowns, sterile gloves, sterile drape, hand hygiene and skin antiseptic. A timeout was performed prior to the initiation of the procedure. The right groin was prepped and draped in the usual sterile fashion. Thereafter using modified Seldinger technique, transfemoral access into the right common femoral artery was obtained without difficulty. Over a 0.035 inch guidewire, a 5 French Pinnacle sheath was inserted. Through this, and also over 0.035 inch guidewire, a 5 [place] JB 1 catheter was advanced to the aortic arch region and selectively positioned in the right common carotid artery, the right vertebral artery, the left common carotid artery and the left subclavian artery. FINDINGS: The right common carotid arteriogram demonstrates the right external carotid artery and its major branches to be widely patent. The right internal carotid artery at the bulb has a smooth shallow plaque along the posterior wall with a small focal ulceration. More distally the right internal carotid artery is seen to opacify to the cranial skull base. The petrous, cavernous and the supraclinoid segments are widely patent. There is a mild broad-based bulge along the posterior wall of the right internal carotid artery at the level of the ophthalmic artery. A right posterior communicating artery is seen opacifying the right posterior cerebral distribution. The right  middle cerebral artery and the right anterior cerebral artery opacify into the capillary and venous phases. The right vertebral artery origin is widely patent. The vessel meanders to the cranial skull base without evidence of kinking or stenosis. The right vertebrobasilar junction is widely patent as is the right posterior-inferior cerebellar artery. The basilar artery, the superior cerebellar arteries and the anterior-inferior cerebellar arteries opacify into the capillary and venous phases. No evidence of unopacified blood is seen from the left vertebrobasilar junction region. The left common carotid arteriogram demonstrates the left external carotid artery and its major branches to be widely patent. The left internal carotid artery at the bulb has a shelf-like plaque along the posterior wall with evidence of approximately 20% stenosis by the NASCET criteria. A small focal triangular ulceration is seen at the superior aspect of the atherosclerotic plaque. More distally the left internal carotid artery is seen to opacify to the cranial skull base. The petrous, cavernous and supraclinoid segments are widely patent. Mild focal areas of focal outpouching are seen involving the caval cavernous segment inferior aspect. The left posterior communicating artery demonstrates opacification of the left posterior cerebral artery distribution with mild intracranial arteriosclerosis involving the P3 region. The left middle cerebral artery and the left anterior cerebral artery opacify into the capillary and venous phases. Mild arteriosclerotic changes are seen involving the left middle cerebral artery mid M1 segment. The venous phase demonstrates a hypoplastic left transverse sinus. The left subclavian arteriogram demonstrates patency of the thyrocervical trunk branches. No angiographic evidence of the left vertebral artery is seen proximally or reconstitution distally via the thyrocervical trunk branches. IMPRESSION:  Angiographically nonvisualization of the left vertebral artery proximally or distally without evidence of  washout of unopacified blood in the proximal basilar artery. 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 Mild-to-moderate arteriosclerotic changes involving the carotid bulbs bilaterally slightly worse on the left side as mentioned above. PLAN: Follow-up as per referring MD. Electronically Signed   By: Luanne Bras M.D.   On: 01/26/2019 10:57   Ir Angio Vertebral Sel Subclavian Innominate Uni L Mod Sed  Result Date: 12/29/2018 CLINICAL DATA:  New onset of bilateral lower extremity weakness, severe dysarthria, difficulty breathing and varying level of consciousness. Abnormal MRI MRA of the brain.  EXAM: BILATERAL COMMON CAROTID AND INNOMINATE ANGIOGRAPHY  COMPARISON:  MRI MRA of the brain of January 25, 2019.  MEDICATIONS: Heparin 2000 units IV; vancomycin 1 g IV antibiotic was administered within 1 hour of the procedure.  ANESTHESIA/SEDATION: General anesthesia.  CONTRAST:  Isovue 300 approximately 60 mL.  FLUOROSCOPY TIME:  Fluoroscopy Time: 12 minutes 12 seconds (1185 mGy).  COMPLICATIONS: None immediate.  TECHNIQUE: Informed written consent was obtained from the patient's wife and also in concordance with 2 MDs. All questions were addressed. Maximal Sterile Barrier Technique was utilized including caps, mask, sterile gowns, sterile gloves, sterile drape, hand hygiene and skin antiseptic. A timeout was performed prior to the initiation of the procedure.  The right groin was prepped and draped in the usual sterile fashion. Thereafter using modified Seldinger technique, transfemoral access into the right common femoral artery was obtained without difficulty. Over a 0.035 inch guidewire, a 5 French Pinnacle sheath was inserted. Through this, and also over 0.035 inch guidewire, a 5 [place] JB 1 catheter was advanced to the aortic arch region and selectively positioned in the right  common carotid artery, the right vertebral artery, the left common carotid artery and the left subclavian artery.  FINDINGS: The right common carotid arteriogram demonstrates the right external carotid artery and its major branches to be widely patent.  The right internal carotid artery at the bulb has a smooth shallow plaque along the posterior wall with a small focal ulceration. More distally the right internal carotid artery is seen to opacify to the cranial skull base. The petrous, cavernous and the supraclinoid segments are widely patent.  There is a mild broad-based bulge along the posterior wall of the right internal carotid artery at the level of the ophthalmic artery.  A right posterior communicating artery is seen opacifying the right posterior cerebral distribution.  The right middle cerebral artery and the right anterior cerebral artery opacify into the capillary and venous phases.  The right vertebral artery origin is widely patent.  The vessel meanders to the cranial skull base without evidence of kinking or stenosis.  The right vertebrobasilar junction is widely patent as is the right posterior-inferior cerebellar artery.  The basilar artery, the superior cerebellar arteries and the anterior-inferior cerebellar arteries opacify into the capillary and venous phases. No evidence of unopacified blood is seen from the left vertebrobasilar junction region. The left common carotid arteriogram demonstrates the left external carotid artery and its major branches to be widely patent.  The left internal carotid artery at the bulb has a shelf-like plaque along the posterior wall with evidence of approximately 20% stenosis by the NASCET criteria.  A small focal triangular ulceration is seen at the superior aspect of the atherosclerotic plaque.  More distally the left internal carotid artery is seen to opacify to the cranial skull base. The petrous, cavernous and supraclinoid segments are widely  patent.  Mild focal areas of focal outpouching are seen involving the  caval cavernous segment inferior aspect.  The left posterior communicating artery demonstrates opacification of the left posterior cerebral artery distribution with mild intracranial arteriosclerosis involving the P3 region.  The left middle cerebral artery and the left anterior cerebral artery opacify into the capillary and venous phases.  Mild arteriosclerotic changes are seen involving the left middle cerebral artery mid M1 segment. The venous phase demonstrates a hypoplastic left transverse sinus.  The left subclavian arteriogram demonstrates patency of the thyrocervical trunk branches. No angiographic evidence of the left vertebral artery is seen proximally or reconstitution distally via the thyrocervical trunk branches.  IMPRESSION: Angiographically nonvisualization of the left vertebral artery proximally or distally without evidence of washout of unopacified blood in the proximal basilar artery. 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  Mild-to-moderate arteriosclerotic changes involving the carotid bulbs bilaterally slightly worse on the left side as mentioned above.  PLAN: Follow-up as per referring MD.   Electronically Signed   By: Luanne Bras M.D.   On: 01/26/2019 10:57  Ir Angio Vertebral Sel Vertebral Uni R Mod Sed  Result Date: 01/09/2019 CLINICAL DATA:  New onset of bilateral lower extremity weakness, severe dysarthria, difficulty breathing and varying level of consciousness. Abnormal MRI MRA of the brain.  EXAM: BILATERAL COMMON CAROTID AND INNOMINATE ANGIOGRAPHY  COMPARISON:  MRI MRA of the brain of January 25, 2019.  MEDICATIONS: Heparin 2000 units IV; vancomycin 1 g IV antibiotic was administered within 1 hour of the procedure.  ANESTHESIA/SEDATION: General anesthesia.  CONTRAST:  Isovue 300 approximately 60 mL.  FLUOROSCOPY TIME:  Fluoroscopy Time: 12 minutes 12 seconds (1185 mGy).   COMPLICATIONS: None immediate.  TECHNIQUE: Informed written consent was obtained from the patient's wife and also in concordance with 2 MDs. All questions were addressed. Maximal Sterile Barrier Technique was utilized including caps, mask, sterile gowns, sterile gloves, sterile drape, hand hygiene and skin antiseptic. A timeout was performed prior to the initiation of the procedure.  The right groin was prepped and draped in the usual sterile fashion. Thereafter using modified Seldinger technique, transfemoral access into the right common femoral artery was obtained without difficulty. Over a 0.035 inch guidewire, a 5 French Pinnacle sheath was inserted. Through this, and also over 0.035 inch guidewire, a 5 [place] JB 1 catheter was advanced to the aortic arch region and selectively positioned in the right common carotid artery, the right vertebral artery, the left common carotid artery and the left subclavian artery.  FINDINGS: The right common carotid arteriogram demonstrates the right external carotid artery and its major branches to be widely patent.  The right internal carotid artery at the bulb has a smooth shallow plaque along the posterior wall with a small focal ulceration. More distally the right internal carotid artery is seen to opacify to the cranial skull base. The petrous, cavernous and the supraclinoid segments are widely patent.  There is a mild broad-based bulge along the posterior wall of the right internal carotid artery at the level of the ophthalmic artery.  A right posterior communicating artery is seen opacifying the right posterior cerebral distribution.  The right middle cerebral artery and the right anterior cerebral artery opacify into the capillary and venous phases.  The right vertebral artery origin is widely patent.  The vessel meanders to the cranial skull base without evidence of kinking or stenosis.  The right vertebrobasilar junction is widely patent as is the right  posterior-inferior cerebellar artery.  The basilar artery, the superior cerebellar arteries and the anterior-inferior cerebellar arteries  opacify into the capillary and venous phases. No evidence of unopacified blood is seen from the left vertebrobasilar junction region. The left common carotid arteriogram demonstrates the left external carotid artery and its major branches to be widely patent.  The left internal carotid artery at the bulb has a shelf-like plaque along the posterior wall with evidence of approximately 20% stenosis by the NASCET criteria.  A small focal triangular ulceration is seen at the superior aspect of the atherosclerotic plaque.  More distally the left internal carotid artery is seen to opacify to the cranial skull base. The petrous, cavernous and supraclinoid segments are widely patent.  Mild focal areas of focal outpouching are seen involving the caval cavernous segment inferior aspect.  The left posterior communicating artery demonstrates opacification of the left posterior cerebral artery distribution with mild intracranial arteriosclerosis involving the P3 region.  The left middle cerebral artery and the left anterior cerebral artery opacify into the capillary and venous phases.  Mild arteriosclerotic changes are seen involving the left middle cerebral artery mid M1 segment. The venous phase demonstrates a hypoplastic left transverse sinus.  The left subclavian arteriogram demonstrates patency of the thyrocervical trunk branches. No angiographic evidence of the left vertebral artery is seen proximally or reconstitution distally via the thyrocervical trunk branches.  IMPRESSION: Angiographically nonvisualization of the left vertebral artery proximally or distally without evidence of washout of unopacified blood in the proximal basilar artery. 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  Mild-to-moderate arteriosclerotic changes involving the carotid bulbs  bilaterally slightly worse on the left side as mentioned above.  PLAN: Follow-up as per referring MD.   Electronically Signed   By: Luanne Bras M.D.   On: 01/26/2019 10:57   Microbiology Recent Results (from the past 240 hour(s))  Culture, respiratory (non-expectorated)     Status: None (Preliminary result)   Collection Time: 01/29/19 11:50 AM   Specimen: Tracheal Aspirate; Respiratory  Result Value Ref Range Status   Specimen Description TRACHEAL ASPIRATE  Final   Special Requests NONE  Final   Gram Stain   Final    ABUNDANT WBC PRESENT,BOTH PMN AND MONONUCLEAR FEW GRAM POSITIVE COCCI FEW YEAST Performed at [facility], [place] [address] [place], [place] [address]    Culture FEW YEAST  Final   Report Status PENDING  Incomplete  Culture, blood (routine x 2)     Status: None (Preliminary result)   Collection Time: 01/29/19 12:14 PM   Specimen: BLOOD RIGHT HAND  Result Value Ref Range Status   Specimen Description BLOOD RIGHT HAND  Final   Special Requests   Final    BOTTLES DRAWN AEROBIC ONLY Blood Culture adequate volume   Culture   Final    NO GROWTH 2 DAYS Performed at [facility], [place] [address] [place], [place] [address]    Report Status PENDING  Incomplete  Culture, blood (routine x 2)     Status: Abnormal (Preliminary result)   Collection Time: 01/29/19 12:15 PM   Specimen: BLOOD LEFT FOREARM  Result Value Ref Range Status   Specimen Description BLOOD LEFT FOREARM  Final   Special Requests   Final    BOTTLES DRAWN AEROBIC AND ANAEROBIC Blood Culture adequate volume   Culture  Setup Time (A)  Final    YEAST IN BOTH AEROBIC AND ANAEROBIC BOTTLES CRITICAL RESULT CALLED TO, READ BACK BY AND VERIFIED WITH: Gwen Pounds MAYER [id number] FCP Performed at [facility], [place]  [address] [place], [place] [address]    Culture YEAST (A)  Final   Report Status PENDING  Incomplete  Blood Culture ID Panel (Reflexed)     Status: Abnormal    Collection Time: 01/29/19 12:15 PM  Result Value Ref Range Status   Enterococcus species NOT DETECTED NOT DETECTED Final   Listeria monocytogenes NOT DETECTED NOT DETECTED Final   Staphylococcus species NOT DETECTED NOT DETECTED Final   Staphylococcus aureus (BCID) NOT DETECTED NOT DETECTED Final   Streptococcus species NOT DETECTED NOT DETECTED Final   Streptococcus agalactiae NOT DETECTED NOT DETECTED Final   Streptococcus pneumoniae NOT DETECTED NOT DETECTED Final   Streptococcus pyogenes NOT DETECTED NOT DETECTED Final   Acinetobacter baumannii NOT DETECTED NOT DETECTED Final   Enterobacteriaceae species NOT DETECTED NOT DETECTED Final   Enterobacter cloacae complex NOT DETECTED NOT DETECTED Final   Escherichia coli NOT DETECTED NOT DETECTED Final   Klebsiella oxytoca NOT DETECTED NOT DETECTED Final   Klebsiella pneumoniae NOT DETECTED NOT DETECTED Final   Proteus species NOT DETECTED NOT DETECTED Final   Serratia marcescens NOT DETECTED NOT DETECTED Final   Haemophilus influenzae NOT DETECTED NOT DETECTED Final   Neisseria meningitidis NOT DETECTED NOT DETECTED Final   Pseudomonas aeruginosa NOT DETECTED NOT DETECTED Final   Candida albicans NOT DETECTED NOT DETECTED Final   Candida glabrata DETECTED (A) NOT DETECTED Final    Comment: CRITICAL RESULT CALLED TO, READ BACK BY AND VERIFIED WITH: PHARMD  ANDREW MAYER [id number] FCP    Candida krusei NOT DETECTED NOT DETECTED Final   Candida parapsilosis NOT DETECTED NOT DETECTED Final   Candida tropicalis NOT DETECTED NOT DETECTED Final    Comment: Performed at [facility], [address] [place], [place] [address]  C difficile quick scan w PCR reflex     Status: None   Collection Time: 01/29/19 12:40 PM   Specimen: STOOL  Result Value Ref Range Status   C Diff antigen NEGATIVE NEGATIVE Final   C Diff toxin NEGATIVE NEGATIVE Final   C Diff interpretation No C. difficile detected.  Final    Comment: Performed at [facility], [place] [address] [place], [place] [address]  Culture, Urine     Status: Abnormal   Collection Time: 01/29/19  8:35 PM   Specimen: Urine, Random  Result Value Ref Range Status   Specimen Description URINE, RANDOM  Final   Special Requests   Final    NONE Performed at [facility], [place] [address] [place], [place] [address]    Culture 60,000 COLONIES/mL YEAST (A)  Final   Report Status 01-Feb-2019 FINAL  Final    Lab Basic Metabolic Panel: Recent Labs  Lab 01/26/19 0500  01/26/19 1716 01/27/19 0452 01/27/19 1700 01/28/19 0420 01/29/19 0327 01/30/19 0322  NA 149*  --   --  148*  --  149* 148* 146*  K 4.2  --   --  4.0  --  4.0 4.2 4.4  CL 115*  --   --  115*  --  119* 117* 116*  CO2 16*  --   --  19*  --  19* 18* 16*  GLUCOSE 107*  --   --  123*  --  128* 148* 140*  BUN 63*  --   --  83*  --  98* 112* 135*  CREATININE 2.59*  --   --  2.94*  --  3.04* 3.77* 4.83*  CALCIUM 8.2*  --   --  7.8*  --  7.9* 7.5* 8.0*  MG  --    < > 2.2 2.2 2.3  --  2.2 2.2  PHOS  --    < > 5.1* 5.1* 5.4*  --  6.0* 8.0*   < > = values in this interval not displayed.   Liver Function Tests: Recent Labs  Lab 01/27/19 0452 01/28/19 0420 01/29/19 0327 01/30/19 0322  AST 42* 55* 49* 1,239*  ALT 23 27 28  553*  ALKPHOS 133* 166* 157* 202*  BILITOT 1.2 1.3* 0.7 1.0  PROT 4.6* 5.0* 4.6* 4.8*  ALBUMIN 1.8* 1.7* 1.4* 1.4*   Recent Labs  Lab 01/30/19 0652  LIPASE 127*   No results for input(s): AMMONIA in the last 168 hours. CBC: Recent Labs  Lab 01/15/2019 1025 01/18/2019 1535 01/27/19 0452 01/28/19 0420 01/29/19 0327 01/30/19 0322  WBC 35.1*  --  23.5* 40.3* 56.6* 54.1*  NEUTROABS  --   --   --   --  51.9*  --   HGB 10.0* 9.9* 9.3* 10.2* 9.5* 8.2*  HCT 30.9* 29.0* 30.1* 33.0* 30.6* 26.1*  MCV 90.6  --  93.8 93.0 93.3 94.2  PLT 233  --  241 253 260 320   Cardiac Enzymes: No results for input(s): CKTOTAL, CKMB, CKMBINDEX, TROPONINI in the last 168 hours. Sepsis  Labs: Recent Labs  Lab 01/27/19 0452 01/28/19 0420 01/29/19 0327 01/29/19 1240 01/30/19 0322  PROCALCITON  --   --   --  7.16 8.88  WBC 23.5* 40.3* 56.6*  --  54.1*    Procedures/Operations   Spero Geralds 03/01/19, 2:35 PM

## 2019-02-26 NOTE — Progress Notes (Signed)
Patient still on comfort care measures. Family wants to be notified when patient passes. Will continue current plan of care.

## 2019-02-26 NOTE — Progress Notes (Signed)
Patient is comfort measures. Will transfer to [facility] with palliative order set.   Lenice Llamas, MD Pulmonary and [place] Pager: [place]

## 2019-02-26 NOTE — Progress Notes (Signed)
Wasted 25 cc morphine IV gtt in the stericycle with  Erin Hearing, PharmD

## 2019-02-26 NOTE — Progress Notes (Signed)
Pt deceased at 83. Post mortem care and family with patient at bedside.

## 2019-02-26 DEATH — deceased

## 2019-07-27 ENCOUNTER — Telehealth: Payer: Self-pay

## 2019-07-27 NOTE — Telephone Encounter (Signed)
Pt deceased

## 2019-07-27 NOTE — Telephone Encounter (Signed)
-----   Message from Bernita Raisin, RN sent at 07/23/2018  4:03 PM EDT ----- Regarding: repeat chest ct 3 yr sk Repeat chest ct 3 yr sk
